# Patient Record
Sex: Female | Born: 1940 | Race: White | Hispanic: No | Smoking: Former smoker
Health system: Southern US, Community
[De-identification: ages and names within clinical notes are randomized; demographics above are authoritative.]

## PROBLEM LIST (undated history)

## (undated) DIAGNOSIS — E079 Disorder of thyroid, unspecified: Secondary | ICD-10-CM

## (undated) DIAGNOSIS — K7460 Unspecified cirrhosis of liver: Secondary | ICD-10-CM

## (undated) DIAGNOSIS — E039 Hypothyroidism, unspecified: Secondary | ICD-10-CM

## (undated) DIAGNOSIS — I10 Essential (primary) hypertension: Secondary | ICD-10-CM

---

## 2012-01-01 ENCOUNTER — Ambulatory Visit (INDEPENDENT_AMBULATORY_CARE_PROVIDER_SITE_OTHER): Payer: Medicare Other | Admitting: Family Medicine

## 2012-01-01 VITALS — BP 133/83 | HR 86 | Temp 97.8°F | Resp 16 | Ht 64.0 in | Wt 204.0 lb

## 2012-01-01 DIAGNOSIS — I10 Essential (primary) hypertension: Secondary | ICD-10-CM | POA: Insufficient documentation

## 2012-01-01 DIAGNOSIS — E039 Hypothyroidism, unspecified: Secondary | ICD-10-CM | POA: Insufficient documentation

## 2012-01-01 MED ORDER — CHLORTHALIDONE 25 MG PO TABS: 25.0000 mg | ORAL_TABLET | Freq: Every day | ORAL | Status: DC

## 2012-01-01 NOTE — Progress Notes (Signed)
  Subjective:    Patient ID: Kaylee King, female    DOB: 07/14/1941, 71 y.o.   MRN: [id number]  HPI  Seen earlier today at Dr. Moshe Cipro for dental consultation/routine care and BP noted to be elevated.  No history of HTN. Denies FH of HTN  Occ headache  Denies CP/SOB/or focal deficits   PMH/ Hypothyroid  Prior care [facility] Review of Systems     Objective:   Physical Exam  Constitutional: She appears well-developed and well-nourished.  Eyes: Pupils are equal, round, and reactive to light.  Neck: Normal range of motion. No JVD present. Carotid bruit is not present. No edema present. No thyromegaly present.  Cardiovascular: Normal rate, regular rhythm and normal heart sounds.   Pulmonary/Chest: Effort normal and breath sounds normal.  Abdominal: Soft. Bowel sounds are normal. There is no hepatosplenomegaly.  Neurological: She is alert.  Skin: Skin is warm.  Psychiatric:       Abrupt in demeanor           Assessment & Plan:   1. HTN (hypertension)  chlorthalidone (HYGROTON) 25 MG tablet, Comprehensive metabolic panel  2. Hypothyroid  TSH   Patient to follow up with her primary MD in 1 month to recheck BP

## 2012-01-02 LAB — COMPREHENSIVE METABOLIC PANEL
ALT: 11 U/L (ref 0–35)
AST: 20 U/L (ref 0–37)
Albumin: 4.9 g/dL (ref 3.5–5.2)
Alkaline Phosphatase: 67 U/L (ref 39–117)
BUN: 13 mg/dL (ref 6–23)
CO2: 26 mEq/L (ref 19–32)
Calcium: 9.8 mg/dL (ref 8.4–10.5)
Chloride: 101 mEq/L (ref 96–112)
Creat: 0.98 mg/dL (ref 0.50–1.10)
Glucose, Bld: 109 mg/dL — ABNORMAL HIGH (ref 70–99)
Potassium: 4.4 mEq/L (ref 3.5–5.3)
Sodium: 141 mEq/L (ref 135–145)
Total Bilirubin: 0.8 mg/dL (ref 0.3–1.2)
Total Protein: 8.3 g/dL (ref 6.0–8.3)

## 2012-01-02 LAB — TSH: TSH: 3.665 u[IU]/mL (ref 0.350–4.500)

## 2012-05-21 ENCOUNTER — Telehealth: Payer: Self-pay

## 2012-05-21 NOTE — Telephone Encounter (Signed)
Patient saw Dr. Hal Hope earlier in the year and had a "workup". Now wants prescribed a thyroid medication that was not previously prescribed. Advised her she might need to be evaluated by new provider before we could do that. Please call patient at [phone] and advise if we can order new RX or if she needs to be seen first.

## 2020-11-23 ENCOUNTER — Encounter (HOSPITAL_COMMUNITY)
Admission: EM | Disposition: A | Discharge status: Skilled Nursing Facility | Payer: Self-pay | Source: Home / Self Care | Attending: Student

## 2020-11-23 ENCOUNTER — Emergency Department (HOSPITAL_COMMUNITY): Payer: Medicare Other

## 2020-11-23 ENCOUNTER — Inpatient Hospital Stay (HOSPITAL_COMMUNITY): Payer: Medicare Other

## 2020-11-23 ENCOUNTER — Inpatient Hospital Stay (HOSPITAL_COMMUNITY)
Admission: EM | Admit: 2020-11-23 | Discharge: 2020-12-19 | DRG: 432 | Disposition: A | Discharge status: Skilled Nursing Facility | Payer: Medicare Other | Attending: Family Medicine | Admitting: Family Medicine

## 2020-11-23 ENCOUNTER — Encounter (HOSPITAL_COMMUNITY): Payer: Self-pay

## 2020-11-23 ENCOUNTER — Other Ambulatory Visit: Payer: Self-pay

## 2020-11-23 DIAGNOSIS — N179 Acute kidney failure, unspecified: Secondary | ICD-10-CM | POA: Diagnosis present

## 2020-11-23 DIAGNOSIS — K259 Gastric ulcer, unspecified as acute or chronic, without hemorrhage or perforation: Secondary | ICD-10-CM | POA: Diagnosis not present

## 2020-11-23 DIAGNOSIS — F05 Delirium due to known physiological condition: Secondary | ICD-10-CM | POA: Diagnosis present

## 2020-11-23 DIAGNOSIS — J440 Chronic obstructive pulmonary disease with acute lower respiratory infection: Secondary | ICD-10-CM | POA: Diagnosis present

## 2020-11-23 DIAGNOSIS — K922 Gastrointestinal hemorrhage, unspecified: Secondary | ICD-10-CM | POA: Diagnosis not present

## 2020-11-23 DIAGNOSIS — I8510 Secondary esophageal varices without bleeding: Secondary | ICD-10-CM | POA: Diagnosis not present

## 2020-11-23 DIAGNOSIS — K7200 Acute and subacute hepatic failure without coma: Secondary | ICD-10-CM | POA: Diagnosis present

## 2020-11-23 DIAGNOSIS — R0902 Hypoxemia: Secondary | ICD-10-CM

## 2020-11-23 DIAGNOSIS — K709 Alcoholic liver disease, unspecified: Secondary | ICD-10-CM | POA: Diagnosis present

## 2020-11-23 DIAGNOSIS — K766 Portal hypertension: Secondary | ICD-10-CM | POA: Diagnosis present

## 2020-11-23 DIAGNOSIS — Z79890 Hormone replacement therapy (postmenopausal): Secondary | ICD-10-CM

## 2020-11-23 DIAGNOSIS — J9622 Acute and chronic respiratory failure with hypercapnia: Secondary | ICD-10-CM | POA: Diagnosis present

## 2020-11-23 DIAGNOSIS — E876 Hypokalemia: Secondary | ICD-10-CM | POA: Diagnosis not present

## 2020-11-23 DIAGNOSIS — R1310 Dysphagia, unspecified: Secondary | ICD-10-CM

## 2020-11-23 DIAGNOSIS — D689 Coagulation defect, unspecified: Secondary | ICD-10-CM | POA: Diagnosis present

## 2020-11-23 DIAGNOSIS — Z931 Gastrostomy status: Secondary | ICD-10-CM

## 2020-11-23 DIAGNOSIS — I864 Gastric varices: Secondary | ICD-10-CM | POA: Diagnosis present

## 2020-11-23 DIAGNOSIS — J189 Pneumonia, unspecified organism: Secondary | ICD-10-CM | POA: Diagnosis present

## 2020-11-23 DIAGNOSIS — J9690 Respiratory failure, unspecified, unspecified whether with hypoxia or hypercapnia: Secondary | ICD-10-CM

## 2020-11-23 DIAGNOSIS — N390 Urinary tract infection, site not specified: Secondary | ICD-10-CM | POA: Diagnosis present

## 2020-11-23 DIAGNOSIS — L89152 Pressure ulcer of sacral region, stage 2: Secondary | ICD-10-CM | POA: Diagnosis not present

## 2020-11-23 DIAGNOSIS — Z20822 Contact with and (suspected) exposure to covid-19: Secondary | ICD-10-CM | POA: Diagnosis present

## 2020-11-23 DIAGNOSIS — E669 Obesity, unspecified: Secondary | ICD-10-CM | POA: Diagnosis present

## 2020-11-23 DIAGNOSIS — Z87891 Personal history of nicotine dependence: Secondary | ICD-10-CM

## 2020-11-23 DIAGNOSIS — K7581 Nonalcoholic steatohepatitis (NASH): Secondary | ICD-10-CM | POA: Diagnosis present

## 2020-11-23 DIAGNOSIS — K7460 Unspecified cirrhosis of liver: Secondary | ICD-10-CM | POA: Diagnosis not present

## 2020-11-23 DIAGNOSIS — D62 Acute posthemorrhagic anemia: Secondary | ICD-10-CM | POA: Diagnosis present

## 2020-11-23 DIAGNOSIS — I8511 Secondary esophageal varices with bleeding: Secondary | ICD-10-CM | POA: Diagnosis present

## 2020-11-23 DIAGNOSIS — G9340 Encephalopathy, unspecified: Secondary | ICD-10-CM

## 2020-11-23 DIAGNOSIS — E870 Hyperosmolality and hypernatremia: Secondary | ICD-10-CM | POA: Diagnosis not present

## 2020-11-23 DIAGNOSIS — E874 Mixed disorder of acid-base balance: Secondary | ICD-10-CM | POA: Diagnosis not present

## 2020-11-23 DIAGNOSIS — Z9289 Personal history of other medical treatment: Secondary | ICD-10-CM

## 2020-11-23 DIAGNOSIS — D649 Anemia, unspecified: Secondary | ICD-10-CM

## 2020-11-23 DIAGNOSIS — J9601 Acute respiratory failure with hypoxia: Secondary | ICD-10-CM

## 2020-11-23 DIAGNOSIS — J690 Pneumonitis due to inhalation of food and vomit: Secondary | ICD-10-CM | POA: Diagnosis not present

## 2020-11-23 DIAGNOSIS — K7469 Other cirrhosis of liver: Secondary | ICD-10-CM | POA: Diagnosis present

## 2020-11-23 DIAGNOSIS — L89022 Pressure ulcer of left elbow, stage 2: Secondary | ICD-10-CM | POA: Diagnosis not present

## 2020-11-23 DIAGNOSIS — Z86718 Personal history of other venous thrombosis and embolism: Secondary | ICD-10-CM

## 2020-11-23 DIAGNOSIS — E035 Myxedema coma: Secondary | ICD-10-CM | POA: Diagnosis present

## 2020-11-23 DIAGNOSIS — I472 Ventricular tachycardia: Secondary | ICD-10-CM | POA: Diagnosis not present

## 2020-11-23 DIAGNOSIS — R188 Other ascites: Secondary | ICD-10-CM | POA: Diagnosis present

## 2020-11-23 DIAGNOSIS — L8990 Pressure ulcer of unspecified site, unspecified stage: Secondary | ICD-10-CM | POA: Diagnosis present

## 2020-11-23 DIAGNOSIS — G9341 Metabolic encephalopathy: Secondary | ICD-10-CM

## 2020-11-23 DIAGNOSIS — J9602 Acute respiratory failure with hypercapnia: Secondary | ICD-10-CM | POA: Diagnosis not present

## 2020-11-23 DIAGNOSIS — Z7189 Other specified counseling: Secondary | ICD-10-CM | POA: Diagnosis not present

## 2020-11-23 DIAGNOSIS — K257 Chronic gastric ulcer without hemorrhage or perforation: Secondary | ICD-10-CM | POA: Diagnosis not present

## 2020-11-23 DIAGNOSIS — Z9114 Patient's other noncompliance with medication regimen: Secondary | ICD-10-CM

## 2020-11-23 DIAGNOSIS — Z8719 Personal history of other diseases of the digestive system: Secondary | ICD-10-CM | POA: Diagnosis not present

## 2020-11-23 DIAGNOSIS — K7210 Chronic hepatic failure without coma: Secondary | ICD-10-CM | POA: Diagnosis present

## 2020-11-23 DIAGNOSIS — K920 Hematemesis: Secondary | ICD-10-CM | POA: Diagnosis not present

## 2020-11-23 DIAGNOSIS — R7989 Other specified abnormal findings of blood chemistry: Secondary | ICD-10-CM | POA: Diagnosis not present

## 2020-11-23 DIAGNOSIS — R627 Adult failure to thrive: Secondary | ICD-10-CM | POA: Diagnosis present

## 2020-11-23 DIAGNOSIS — R578 Other shock: Secondary | ICD-10-CM | POA: Diagnosis present

## 2020-11-23 DIAGNOSIS — I8501 Esophageal varices with bleeding: Secondary | ICD-10-CM | POA: Diagnosis not present

## 2020-11-23 DIAGNOSIS — Z515 Encounter for palliative care: Secondary | ICD-10-CM | POA: Diagnosis not present

## 2020-11-23 DIAGNOSIS — R001 Bradycardia, unspecified: Secondary | ICD-10-CM | POA: Diagnosis not present

## 2020-11-23 DIAGNOSIS — E875 Hyperkalemia: Secondary | ICD-10-CM | POA: Diagnosis not present

## 2020-11-23 DIAGNOSIS — K253 Acute gastric ulcer without hemorrhage or perforation: Secondary | ICD-10-CM | POA: Insufficient documentation

## 2020-11-23 DIAGNOSIS — E039 Hypothyroidism, unspecified: Secondary | ICD-10-CM

## 2020-11-23 DIAGNOSIS — Z79899 Other long term (current) drug therapy: Secondary | ICD-10-CM

## 2020-11-23 DIAGNOSIS — I8500 Esophageal varices without bleeding: Secondary | ICD-10-CM | POA: Insufficient documentation

## 2020-11-23 DIAGNOSIS — R4182 Altered mental status, unspecified: Secondary | ICD-10-CM | POA: Diagnosis present

## 2020-11-23 DIAGNOSIS — B9620 Unspecified Escherichia coli [E. coli] as the cause of diseases classified elsewhere: Secondary | ICD-10-CM | POA: Diagnosis present

## 2020-11-23 DIAGNOSIS — R069 Unspecified abnormalities of breathing: Secondary | ICD-10-CM

## 2020-11-23 DIAGNOSIS — I10 Essential (primary) hypertension: Secondary | ICD-10-CM | POA: Diagnosis present

## 2020-11-23 DIAGNOSIS — D696 Thrombocytopenia, unspecified: Secondary | ICD-10-CM | POA: Diagnosis present

## 2020-11-23 DIAGNOSIS — J9621 Acute and chronic respiratory failure with hypoxia: Secondary | ICD-10-CM | POA: Diagnosis present

## 2020-11-23 DIAGNOSIS — B9681 Helicobacter pylori [H. pylori] as the cause of diseases classified elsewhere: Secondary | ICD-10-CM | POA: Diagnosis present

## 2020-11-23 DIAGNOSIS — Z91148 Patient's other noncompliance with medication regimen for other reason: Secondary | ICD-10-CM

## 2020-11-23 DIAGNOSIS — Z6831 Body mass index (BMI) 31.0-31.9, adult: Secondary | ICD-10-CM

## 2020-11-23 HISTORY — PX: ESOPHAGOGASTRODUODENOSCOPY: SHX5428

## 2020-11-23 HISTORY — DX: Disorder of thyroid, unspecified: E07.9

## 2020-11-23 HISTORY — DX: Unspecified cirrhosis of liver: K74.60

## 2020-11-23 HISTORY — PX: ESOPHAGEAL BANDING: SHX5518

## 2020-11-23 LAB — PHOSPHORUS: Phosphorus: 3.3 mg/dL (ref 2.5–4.6)

## 2020-11-23 LAB — COMPREHENSIVE METABOLIC PANEL
ALT: 13 U/L (ref 0–44)
AST: 29 U/L (ref 15–41)
Albumin: 2.3 g/dL — ABNORMAL LOW (ref 3.5–5.0)
Alkaline Phosphatase: 93 U/L (ref 38–126)
Anion gap: 13 (ref 5–15)
BUN: 45 mg/dL — ABNORMAL HIGH (ref 8–23)
CO2: 18 mmol/L — ABNORMAL LOW (ref 22–32)
Calcium: 8.1 mg/dL — ABNORMAL LOW (ref 8.9–10.3)
Chloride: 112 mmol/L — ABNORMAL HIGH (ref 98–111)
Creatinine, Ser: 1.79 mg/dL — ABNORMAL HIGH (ref 0.44–1.00)
GFR, Estimated: 28 mL/min — ABNORMAL LOW (ref >60)
Glucose, Bld: 136 mg/dL — ABNORMAL HIGH (ref 70–99)
Potassium: 4.2 mmol/L (ref 3.5–5.1)
Sodium: 143 mmol/L (ref 135–145)
Total Bilirubin: 0.9 mg/dL (ref 0.3–1.2)
Total Protein: 5.6 g/dL — ABNORMAL LOW (ref 6.5–8.1)

## 2020-11-23 LAB — URINALYSIS, ROUTINE W REFLEX MICROSCOPIC
Bilirubin Urine: NEGATIVE
Bilirubin Urine: NEGATIVE
Glucose, UA: NEGATIVE mg/dL
Glucose, UA: NEGATIVE mg/dL
Hgb urine dipstick: NEGATIVE
Hgb urine dipstick: NEGATIVE
Ketones, ur: NEGATIVE mg/dL
Ketones, ur: NEGATIVE mg/dL
Nitrite: NEGATIVE
Nitrite: NEGATIVE
Protein, ur: 30 mg/dL — AB
Protein, ur: 30 mg/dL — AB
Specific Gravity, Urine: 1.015 (ref 1.005–1.030)
Specific Gravity, Urine: 1.015 (ref 1.005–1.030)
WBC, UA: >50 WBC/hpf — ABNORMAL HIGH (ref 0–5)
WBC, UA: >50 WBC/hpf — ABNORMAL HIGH (ref 0–5)
pH: 5 (ref 5.0–8.0)
pH: 5 (ref 5.0–8.0)

## 2020-11-23 LAB — POC OCCULT BLOOD, ED: Fecal Occult Bld: NEGATIVE

## 2020-11-23 LAB — TROPONIN I (HIGH SENSITIVITY): Troponin I (High Sensitivity): 24 ng/L — ABNORMAL HIGH (ref <18)

## 2020-11-23 LAB — RAPID URINE DRUG SCREEN, HOSP PERFORMED
Amphetamines: NOT DETECTED
Barbiturates: NOT DETECTED
Benzodiazepines: NOT DETECTED
Cocaine: NOT DETECTED
Opiates: POSITIVE — AB
Tetrahydrocannabinol: NOT DETECTED

## 2020-11-23 LAB — CK: Total CK: 74 U/L (ref 38–234)

## 2020-11-23 LAB — SARS CORONAVIRUS 2 BY RT PCR (HOSPITAL ORDER, PERFORMED IN ~~LOC~~ HOSPITAL LAB): SARS Coronavirus 2: NEGATIVE

## 2020-11-23 LAB — ABO/RH: ABO/RH(D): O POS

## 2020-11-23 LAB — LACTIC ACID, PLASMA: Lactic Acid, Venous: 5.5 mmol/L (ref 0.5–1.9)

## 2020-11-23 LAB — LIPASE, BLOOD: Lipase: 50 U/L (ref 11–51)

## 2020-11-23 LAB — GLUCOSE, CAPILLARY: Glucose-Capillary: 175 mg/dL — ABNORMAL HIGH (ref 70–99)

## 2020-11-23 LAB — CBG MONITORING, ED: Glucose-Capillary: 158 mg/dL — ABNORMAL HIGH (ref 70–99)

## 2020-11-23 LAB — PREPARE RBC (CROSSMATCH)

## 2020-11-23 LAB — TSH: TSH: 51.314 u[IU]/mL — ABNORMAL HIGH (ref 0.350–4.500)

## 2020-11-23 LAB — MAGNESIUM: Magnesium: 2.2 mg/dL (ref 1.7–2.4)

## 2020-11-23 SURGERY — EGD (ESOPHAGOGASTRODUODENOSCOPY)
Anesthesia: Moderate Sedation

## 2020-11-23 MED ORDER — LACTATED RINGERS IV BOLUS
500.0000 mL | Freq: Once | INTRAVENOUS | Status: AC
  Administered 2020-11-23: 500 mL via INTRAVENOUS

## 2020-11-23 MED ORDER — PANTOPRAZOLE SODIUM 40 MG IV SOLR
40.0000 mg | Freq: Two times a day (BID) | INTRAVENOUS | Status: DC
  Administered 2020-11-23: 40 mg via INTRAVENOUS

## 2020-11-23 MED ORDER — ROCURONIUM BROMIDE 50 MG/5ML IV SOLN
50.0000 mg | Freq: Once | INTRAVENOUS | Status: DC
  Filled 2020-11-23: qty 5

## 2020-11-23 MED ORDER — SODIUM CHLORIDE 0.9% IV SOLUTION: Freq: Once | INTRAVENOUS | Status: DC

## 2020-11-23 MED ORDER — PANTOPRAZOLE SODIUM 40 MG IV SOLR
40.0000 mg | Freq: Two times a day (BID) | INTRAVENOUS | Status: DC
  Filled 2020-11-23: qty 40

## 2020-11-23 MED ORDER — ETOMIDATE 2 MG/ML IV SOLN
10.0000 mg | Freq: Once | INTRAVENOUS | Status: AC
  Administered 2020-11-23: 10 mg via INTRAVENOUS

## 2020-11-23 MED ORDER — ONDANSETRON HCL 4 MG/2ML IJ SOLN
INTRAMUSCULAR | Status: AC
  Administered 2020-11-23: 4 mg via INTRAVENOUS
  Filled 2020-11-23: qty 2

## 2020-11-23 MED ORDER — CHLORHEXIDINE GLUCONATE CLOTH 2 % EX PADS
6.0000 | MEDICATED_PAD | Freq: Every day | CUTANEOUS | Status: DC
  Administered 2020-11-24 – 2020-12-19 (×24): 6 via TOPICAL

## 2020-11-23 MED ORDER — SUCCINYLCHOLINE CHLORIDE 20 MG/ML IJ SOLN
100.0000 mg | Freq: Once | INTRAMUSCULAR | Status: AC
  Administered 2020-11-23: 100 mg via INTRAVENOUS
  Filled 2020-11-23: qty 5

## 2020-11-23 MED ORDER — ETOMIDATE 2 MG/ML IV SOLN
20.0000 mg | Freq: Once | INTRAVENOUS | Status: AC
  Administered 2020-11-23: 20 mg via INTRAVENOUS

## 2020-11-23 MED ORDER — STERILE WATER FOR INJECTION IV SOLN
INTRAVENOUS | Status: DC
  Filled 2020-11-23 (×4): qty 850

## 2020-11-23 MED ORDER — DEXMEDETOMIDINE HCL IN NACL 400 MCG/100ML IV SOLN
0.0000 ug/kg/h | INTRAVENOUS | Status: AC
  Administered 2020-11-23 – 2020-11-24 (×3): 0.4 ug/kg/h via INTRAVENOUS
  Filled 2020-11-23 (×4): qty 100

## 2020-11-23 MED ORDER — DOCUSATE SODIUM 50 MG/5ML PO LIQD
100.0000 mg | Freq: Two times a day (BID) | ORAL | Status: DC
  Administered 2020-11-25 – 2020-11-28 (×4): 100 mg
  Filled 2020-11-23 (×6): qty 10

## 2020-11-23 MED ORDER — CHLORHEXIDINE GLUCONATE 0.12% ORAL RINSE (MEDLINE KIT)
15.0000 mL | Freq: Two times a day (BID) | OROMUCOSAL | Status: DC
  Administered 2020-11-23 – 2020-11-30 (×14): 15 mL via OROMUCOSAL

## 2020-11-23 MED ORDER — ONDANSETRON HCL 4 MG/2ML IJ SOLN: 4.0000 mg | Freq: Once | INTRAMUSCULAR | Status: AC

## 2020-11-23 MED ORDER — SODIUM CHLORIDE 0.9 % IV SOLN
10.0000 mL/h | Freq: Once | INTRAVENOUS | Status: AC
  Administered 2020-11-24: 10 mL/h via INTRAVENOUS

## 2020-11-23 MED ORDER — SODIUM BICARBONATE 8.4 % IV SOLN: 100.0000 meq | Freq: Once | INTRAVENOUS | Status: AC

## 2020-11-23 MED ORDER — SODIUM CHLORIDE 0.9 % IV SOLN
2.0000 g | INTRAVENOUS | Status: DC
  Filled 2020-11-23: qty 20

## 2020-11-23 MED ORDER — SODIUM CHLORIDE 0.9 % IV SOLN: INTRAVENOUS | Status: DC

## 2020-11-23 MED ORDER — INSULIN ASPART 100 UNIT/ML ~~LOC~~ SOLN
0.0000 [IU] | SUBCUTANEOUS | Status: DC
  Administered 2020-11-28 – 2020-12-01 (×10): 2 [IU] via SUBCUTANEOUS
  Administered 2020-12-01 – 2020-12-02 (×2): 4 [IU] via SUBCUTANEOUS
  Administered 2020-12-02 – 2020-12-05 (×17): 2 [IU] via SUBCUTANEOUS
  Administered 2020-12-05: 4 [IU] via SUBCUTANEOUS
  Administered 2020-12-05 – 2020-12-06 (×3): 2 [IU] via SUBCUTANEOUS
  Administered 2020-12-06: 4 [IU] via SUBCUTANEOUS
  Administered 2020-12-08 – 2020-12-13 (×8): 2 [IU] via SUBCUTANEOUS

## 2020-11-23 MED ORDER — ORAL CARE MOUTH RINSE
15.0000 mL | OROMUCOSAL | Status: DC
  Administered 2020-11-24 – 2020-11-30 (×63): 15 mL via OROMUCOSAL

## 2020-11-23 MED ORDER — EPINEPHRINE 1 MG/10ML IJ SOSY
PREFILLED_SYRINGE | INTRAMUSCULAR | Status: AC
  Filled 2020-11-23: qty 10

## 2020-11-23 MED ORDER — SODIUM CHLORIDE 0.9 % IV SOLN
8.0000 mg/h | INTRAVENOUS | Status: AC
  Administered 2020-11-23 – 2020-11-26 (×7): 8 mg/h via INTRAVENOUS
  Filled 2020-11-23 (×12): qty 80

## 2020-11-23 MED ORDER — LEVOTHYROXINE SODIUM 100 MCG/5ML IV SOLN
50.0000 ug | Freq: Every day | INTRAVENOUS | Status: DC
  Administered 2020-11-24 – 2020-11-27 (×4): 50 ug via INTRAVENOUS
  Filled 2020-11-23 (×6): qty 5

## 2020-11-23 MED ORDER — FENTANYL CITRATE (PF) 100 MCG/2ML IJ SOLN
INTRAMUSCULAR | Status: AC
  Administered 2020-11-23: 100 ug via INTRAVENOUS
  Filled 2020-11-23: qty 2

## 2020-11-23 MED ORDER — POLYETHYLENE GLYCOL 3350 17 G PO PACK: 17.0000 g | PACK | Freq: Every day | ORAL | Status: DC | PRN

## 2020-11-23 MED ORDER — SODIUM BICARBONATE 8.4 % IV SOLN
INTRAVENOUS | Status: AC
  Administered 2020-11-23: 100 meq via INTRAVENOUS
  Filled 2020-11-23: qty 150

## 2020-11-23 MED ORDER — SODIUM CHLORIDE 0.9 % IV SOLN
80.0000 mg | Freq: Once | INTRAVENOUS | Status: AC
  Administered 2020-11-23: 80 mg via INTRAVENOUS
  Filled 2020-11-23: qty 80

## 2020-11-23 MED ORDER — FENTANYL 2500MCG IN NS 250ML (10MCG/ML) PREMIX INFUSION
0.0000 ug/h | INTRAVENOUS | Status: DC
  Administered 2020-11-23: 25 ug/h via INTRAVENOUS
  Administered 2020-11-24: 50 ug/h via INTRAVENOUS
  Administered 2020-11-25: 250 ug/h via INTRAVENOUS
  Administered 2020-11-25: 225 ug/h via INTRAVENOUS
  Administered 2020-11-26: 125 ug/h via INTRAVENOUS
  Administered 2020-11-26: 250 ug/h via INTRAVENOUS
  Administered 2020-11-27: 50 ug/h via INTRAVENOUS
  Filled 2020-11-23 (×7): qty 250

## 2020-11-23 MED ORDER — SODIUM CHLORIDE 0.9 % IV SOLN
50.0000 ug/h | INTRAVENOUS | Status: AC
  Administered 2020-11-23 – 2020-11-26 (×6): 50 ug/h via INTRAVENOUS
  Filled 2020-11-23 (×8): qty 1

## 2020-11-23 MED ORDER — MORPHINE SULFATE (PF) 2 MG/ML IV SOLN
2.0000 mg | Freq: Once | INTRAVENOUS | Status: AC
  Administered 2020-11-23: 2 mg via INTRAVENOUS
  Filled 2020-11-23: qty 1

## 2020-11-23 MED ORDER — FENTANYL CITRATE (PF) 100 MCG/2ML IJ SOLN: 25.0000 ug | INTRAMUSCULAR | Status: DC | PRN

## 2020-11-23 MED ORDER — LACTATED RINGERS IV BOLUS
1000.0000 mL | Freq: Once | INTRAVENOUS | Status: AC
  Administered 2020-11-23: 1000 mL via INTRAVENOUS

## 2020-11-23 MED ORDER — DOCUSATE SODIUM 100 MG PO CAPS: 100.0000 mg | ORAL_CAPSULE | Freq: Two times a day (BID) | ORAL | Status: DC | PRN

## 2020-11-23 MED ORDER — MIDAZOLAM HCL (PF) 5 MG/ML IJ SOLN
INTRAMUSCULAR | Status: AC
  Filled 2020-11-23: qty 3

## 2020-11-23 MED ORDER — POLYETHYLENE GLYCOL 3350 17 G PO PACK
17.0000 g | PACK | Freq: Every day | ORAL | Status: DC
  Administered 2020-11-28: 17 g
  Filled 2020-11-23 (×3): qty 1

## 2020-11-23 MED ORDER — FENTANYL CITRATE (PF) 100 MCG/2ML IJ SOLN
INTRAMUSCULAR | Status: AC
  Filled 2020-11-23: qty 4

## 2020-11-23 MED ORDER — NOREPINEPHRINE 4 MG/250ML-% IV SOLN
0.0000 ug/min | INTRAVENOUS | Status: DC
  Administered 2020-11-23: 30 ug/min via INTRAVENOUS
  Administered 2020-11-24 – 2020-11-25 (×2): 2 ug/min via INTRAVENOUS
  Filled 2020-11-23 (×2): qty 250

## 2020-11-23 NOTE — ED Provider Notes (Addendum)
[facility] EMERGENCY DEPARTMENT Provider Note   CSN: [id number] Arrival date & time: 11/23/20  1050     History Chief Complaint  Patient presents with  . Altered Mental Status    Kaylee King is a 80 y.o. female.  HPI Patient has refused all medical care for several years.  Patient's daughter reports that she knows that she is hypothyroid but she has stopped taking any thyroid medications for couple of years.  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  She is not getting any kind of treatment or taking any medications.  Patient has had 2 COVID vaccines but not the booster.  Patient daughter reports that since Christmas, patient has been complaining of a headache.  She has not had any fever that she is aware of.  She reports just now over the past couple of days the headache is gotten severe and she is also noted confusion.  She reports she has vomited a couple of times.  Her appetite has decreased significantly.  Patient's daughter reports that now that her mother finally could not refuse, she is called EMS to transport her to the hospital.  EMS arrival, patient had about a 2-minute unresponsive episode when transferring to the stretcher.  No apparent loss of pulses.  CBG in the field 198.  Room air saturation 90%  Patient appears uncomfortable but she is denying any specific symptoms.  She says she does have a headache sometime but is not endorsing it right now.  She reports sometimes her abdomen is uncomfortable but denying it currently.  She seems confused and ill.    Past Medical History:  Diagnosis Date  . Cirrhosis (HCC)   . Thyroid disease    hypothyroid     Patient Active Problem List   Diagnosis Date Noted  . Hypothyroid 01/01/2012  . HTN (hypertension) 01/01/2012    History reviewed. No pertinent surgical history.   OB History   No obstetric history on file.     History reviewed. No pertinent family history.  Social  History   Tobacco Use  . Smoking status: Former Smoker    Quit date: 12/31/1981    Years since quitting: 38.9  . Smokeless tobacco: Never Used  Substance Use Topics  . Alcohol use: Never  . Drug use: Never    Home Medications Prior to Admission medications   Medication Sig Start Date End Date Taking? Authorizing Provider  chlorthalidone (HYGROTON) 25 MG tablet Take 1 tablet (25 mg total) by mouth daily. 01/01/12 12/31/12  Dois Davenport, MD  levothyroxine (SYNTHROID, LEVOTHROID) 100 MCG tablet Take 100 mcg by mouth daily.    [provider]    Allergies    Patient has no known allergies.  Review of Systems   Review of Systems Level 5 caveat cannot obtain review of systems due to acute illness. Physical Exam Updated Vital Signs BP (!) 88/47   Pulse 83   Temp (!) 97.3 F (36.3 C) (Rectal)   Resp 12   Ht 5\' 4"  (1.626 m)   Wt 92.5 kg   SpO2 100%   BMI 35.00 kg/m   Physical Exam Constitutional:      Comments: Patient is extremely pale, ill and deconditioned in appearance.  HENT:     Head: Normocephalic and atraumatic.     Mouth/Throat:     Comments: Mucous membranes very dry Eyes:     Extraocular Movements: Extraocular movements intact.  Cardiovascular:     Comments: Heart  sounds soft and distant, regular. Pulmonary:     Comments: No respiratory distress.  Lungs are grossly clear. Abdominal:     Comments: Abdomen is soft.  Patient is not guarding.  Genitourinary:    Comments: Stool is brown in appearance. Musculoskeletal:     Comments: No contusions or abrasions to the back buttocks.  No significant peripheral edema.  Patient does seem to have muscular atrophy.  No cellulitic changes or active wounds.  Skin:    Comments: Skin is warm.  Patient is extremely pale.  Neurological:     Comments: Patient is awake but seems confused.  He however is not exhibiting agitated delirium.  She is generally weak but does not have a focal motor deficit     ED  Results / Procedures / Treatments   Labs (all labs ordered are listed, but only abnormal results are displayed) Labs Reviewed  COMPREHENSIVE METABOLIC PANEL - Abnormal; Notable for the following components:      Result Value   Chloride 112 (*)    CO2 18 (*)    Glucose, Bld 136 (*)    BUN 45 (*)    Creatinine, Ser 1.79 (*)    Calcium 8.1 (*)    Total Protein 5.6 (*)    Albumin 2.3 (*)    GFR, Estimated 28 (*)    All other components within normal limits  TSH - Abnormal; Notable for the following components:   TSH 51.314 (*)    All other components within normal limits  SARS CORONAVIRUS 2 BY RT PCR (HOSPITAL ORDER, PERFORMED IN [facility])  URINE CULTURE  AMMONIA  MAGNESIUM  PHOSPHORUS  CK  PROTIME-INR  LACTIC ACID, PLASMA  LACTIC ACID, PLASMA  LIPASE, BLOOD  DIFFERENTIAL  URINALYSIS, ROUTINE W REFLEX MICROSCOPIC  POC OCCULT BLOOD, ED  I-STAT CHEM 8, ED  TYPE AND SCREEN  ABO/RH  PREPARE RBC (CROSSMATCH)  TROPONIN I (HIGH SENSITIVITY)  TROPONIN I (HIGH SENSITIVITY)    EKG EKG Interpretation  Date/Time:  Thursday November 23 2020 11:23:22 EST Ventricular Rate:  82 PR Interval:    QRS Duration: 86 QT Interval:  352 QTC Calculation: 412 R Axis:   8 Text Interpretation: Sinus rhythm Low voltage, precordial leads Nonspecific T abnrm, anterolateral leads AGREE, NO stemi, NO OLD COMPARISON Confirmed by Arby Barrette [phone]) on 11/23/2020 1:28:33 PM   Radiology CT Head Wo Contrast  Result Date: 11/23/2020 CLINICAL DATA:  Headache, altered mental status. EXAM: CT HEAD WITHOUT CONTRAST TECHNIQUE: Contiguous axial images were obtained from the base of the skull through the vertex without intravenous contrast. COMPARISON:  None. FINDINGS: Brain: The brainstem, cerebellum, cerebral peduncles, thalami, basal ganglia, basilar cisterns, and ventricular system appear within normal limits. Periventricular white matter and corona radiata hypodensities favor chronic  ischemic microvascular white matter disease. No intracranial hemorrhage, mass lesion, or acute CVA. Vascular: There is atherosclerotic calcification of the cavernous carotid arteries bilaterally. Skull: Unremarkable Sinuses/Orbits: Unremarkable Other: No supplemental non-categorized findings. IMPRESSION: 1. No acute intracranial findings. 2. Periventricular white matter and corona radiata hypodensities favor chronic ischemic microvascular white matter disease. Electronically Signed   By: Gaylyn Rong M.D.   On: 11/23/2020 13:55   DG Chest Port 1 View  Result Date: 11/23/2020 CLINICAL DATA:  Weakness declined appetite increased confusion x2 days EXAM: PORTABLE CHEST 1 VIEW COMPARISON:  None. FINDINGS: The heart size and mediastinal contours are mildly enlarged, accentuated by technique. Aortic atherosclerosis. Streaky left lower lobe opacity. No pleural effusion or pneumothorax. Thoracic  spondylosis and bilateral AC joints DJD. IMPRESSION: Streaky left lower lobe opacity, likely atelectasis but pneumonia not excluded. Electronically Signed   By: Maudry Mayhew MD   On: 11/23/2020 13:07    Procedures Procedures (including critical care time) CRITICAL CARE Performed by: Arby Barrette   Total critical care time: 45 minutes  Critical care time was exclusive of separately billable procedures and treating other patients.  Critical care was necessary to treat or prevent imminent or life-threatening deterioration.  Critical care was time spent personally by me on the following activities: development of treatment plan with patient and/or surrogate as well as nursing, discussions with consultants, evaluation of patient's response to treatment, examination of patient, obtaining history from patient or surrogate, ordering and performing treatments and interventions, ordering and review of laboratory studies, ordering and review of radiographic studies, pulse oximetry and re-evaluation of patient's  condition. Medications Ordered in ED Medications  morphine 2 MG/ML injection 2 mg (has no administration in time range)  0.9 %  sodium chloride infusion (has no administration in time range)  lactated ringers bolus 1,000 mL (0 mLs Intravenous Stopped 11/23/20 1355)  lactated ringers bolus 500 mL (0 mLs Intravenous Stopped 11/23/20 1441)    ED Course  I have reviewed the triage vital signs and the nursing notes.  Pertinent labs & imaging results that were available during my care of the patient were reviewed by me and considered in my medical decision making (see chart for details).    MDM Rules/Calculators/A&P                         Patient presents very ill appearance.  She is extremely pale in appearance.  Hypotension.  Patient is not febrile.  Stool is not melanotic.  Will initiate fluid resuscitation with lactated Ringer's.  No obvious source of infection at this time.  Patient reportedly has some known cirrhosis, nonalcoholic that has not had any treatment.  By general appearance, suspect significant anemia.  However, no report of any active bleeding or coffee-ground emesis.  Multiple lab specimens have been submitted.  Lab has not been able to get a CBC read.  Blood is diluted in appearance.  At this time with patient appearing profoundly anemic clinically and inability to get a lab specimen, which I suspect is due to gross abnormality interfering with machine function, we will proceed with ordering blood for transfusion.  Patient continues to have a headache.  She does not have respiratory distress.  Pressures remain soft but have responded to some fluid resuscitation with lactated Ringer's.  At this time, due to concerns for extreme anemia, I will not continue with fluid resuscitation until patient has been transfused.  Patient has stopped taking all of her Synthroid for 2 years.  TSH severely elevated, I suspect patient patient to be profoundly hypothyroid.  Patient will be admitted to  ICU service.  Patient is severely ill.  I have reviewed risk of worsening condition with the patient's daughter.  At this time, patient's daughter wishes the patient to be full code.   Final Clinical Impression(s) / ED Diagnoses Final diagnoses:  Encephalopathy  Symptomatic anemia  Hypothyroidism, unspecified type  H/O medication noncompliance    Rx / DC Orders ED Discharge Orders    None       Arby Barrette, MD 11/23/20 1548    Arby Barrette, MD 11/23/20 1621

## 2020-11-23 NOTE — ED Provider Notes (Addendum)
Care of the patient assumed at the change of shift. Here for AMS, has not been taking meds for several years, including synthroid. Noted to be very pale here. Per lab CBC machine will not give any values due to concerns for contamination however this blood was drawn from a fresh IV. Suspect profound anemia, will give transfusion and recheck. Patient's daughter at bedside amenable to admission. Would like for her to be full code.   4:17 PM Spoke with Dr. Katrinka Blazing, ICU, who will evaluate for admission.    5:42 PM Patient noted to have large volume dark red emesis. 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 Will start Protonix, octreotide. Discussed with Dr. Katrinka Blazing at bedside, she will need intubation for airway protection. Daughter at bedside understands and gives verbal consent. GI paged.   6:35 PM Spoke with Dr. Rhea Belton, GI who will consult for possible endoscopy.   Procedure Name: Intubation Date/Time: 11/23/2020 6:36 PM Performed by: Pollyann Savoy, MD Pre-anesthesia Checklist: Patient identified, Patient being monitored, Emergency Drugs available, Timeout performed and Suction available Oxygen Delivery Method: Non-rebreather mask Preoxygenation: Pre-oxygenation with 100% oxygen Induction Type: Rapid sequence Ventilation: Mask ventilation without difficulty Laryngoscope Size: Glidescope and 3 Grade View: Grade I Tube size: 7.5 mm Number of attempts: 1 Placement Confirmation: ETT inserted through vocal cords under direct vision,  CO2 detector and Breath sounds checked- equal and bilateral Secured at: 21 cm Tube secured with: ETT holder    .Critical Care Performed by: Pollyann Savoy, MD Authorized by: Pollyann Savoy, MD   Critical care provider statement:    Critical care time (minutes):  45   Critical care time was exclusive of:  Separately billable procedures and treating other patients   Critical care  was necessary to treat or prevent imminent or life-threatening deterioration of the following conditions:  Circulatory failure and shock   Critical care was time spent personally by me on the following activities:  Discussions with consultants, evaluation of patient's response to treatment, examination of patient, ordering and performing treatments and interventions, ordering and review of laboratory studies, ordering and review of radiographic studies, pulse oximetry, re-evaluation of patient's condition, obtaining history from patient or surrogate and review of old charts   I assumed direction of critical care for this patient from another provider in my specialty: yes     Care discussed with: admitting provider         Pollyann Savoy, MD 11/23/20 1836

## 2020-11-23 NOTE — H&P (Signed)
NAME:  Kaylee King, MRN:  [id number], DOB:  [birth year], LOS: 0 ADMISSION DATE:  11/23/2020, CONSULTATION DATE:  11/23/20 REFERRING MD:  Bernette Mayers, CHIEF COMPLAINT:  AMS   Brief History   80 year old woman with history of hypothyroidism noncompliant presenting with progressive headaches, fatigue, confusion, and hypotension of unclear etiology.  History of present illness   80 year old woman with history of hypothyroidism noncompliant presenting with progressive headaches, fatigue, confusion, and hypotension of unclear etiology. - Over past 3 days, started with headache, which has been debilitating - Today, became associated with profound fatigue, confusion - Brought in by EMS, noted to have blood around nares - Patient unable to provide history so getting from daughter at bedside - No reports of GIB symptoms - Carries a diagnosis of NASH cirrhosis, portal vein thrombosis with cavernous transformation, iron deficiency anemia  Past Medical History  NASH cirrhosis Hypothyroidism noncompliant with meds Does not go to doctors Hx of ERCP in 2018, otherwise no surgical hx  Significant Hospital Events   1/20 admitted  Consults:  PCCM  Procedures:  n/a  Significant Diagnostic Tests:  CT Head 1. No acute intracranial findings. 2. Periventricular white matter and corona radiata hypodensities favor chronic ischemic microvascular white matter disease.  CXR neg  Micro Data:  COVID neg  Antimicrobials:  n/a   Interim history/subjective:  consulted  Objective   Blood pressure (!) 84/74, pulse 82, temperature (!) 97.2 F (36.2 C), temperature source Oral, resp. rate 20, height 5\' 4"  (1.626 m), weight 92.5 kg, SpO2 95 %.        Intake/Output Summary (Last 24 hours) at 11/23/2020 1642 Last data filed at 11/23/2020 1441 Gross per 24 hour  Intake 1500 ml  Output -  Net 1500 ml   Filed Weights   11/23/20 1108  Weight: 92.5 kg    Examination: Constitutional: pale ill appearing  woman in NAD  Eyes: pupils slightly dilated, equal, reactive Ears, nose, mouth, and throat: MM dry, blood around R nares Cardiovascular: RRR, +SEM, ext warm Respiratory: crackles bases Gastrointestinal: soft, diffuse TTP (tender to palpation everywhere) Skin: No rashes, normal turgor Neurologic: moves all 4 ext but requires repeated prompting Psychiatric: AO to self only  Cr 1.8 TSH 51 CBC unable to be run despite multiple tries with question of profound anemia.  Resolved Hospital Problem list   N/a  Assessment & Plan:  During course of my evaluation patient vomited blood.  This combined with hx of NASH cirrhosis, portal vein thrombosis, inability to run H/H, and hypotension make me concerned for hemorrhagic shock from variceal bleed.   Confused, high risk for aspiration and not protecting airway.  Question HE.  Uncontrolled hypothyroidism with clinical hx not c/w overt myxedema  Acute kidney injury, unclear baseline  - INR pending, transfuse PRN - 2 units pRBC ordered - Try to get a h/h after transfusion - PPI, octreotide gtt - Intubate, usual VAP prevention bundle - GI to be consulted by EDP - Ceftriaxone for SBP ppx - Start IV synthroid - Guarded prognosis   Best practice:  Diet: NPO Pain/Anxiety/Delirium protocol (if indicated): N/A VAP protocol (if indicated): in place DVT prophylaxis: SCD GI prophylaxis: PPI Glucose control: SSI Mobility: BR Code Status: full Family Communication: daughter at bedside Disposition: ICU   Medical Decision Making    Diagnoses that are immediately life threatening include UGIB, shock, encephalopathy Critical test findings: vomiting blood Interventions today to address these diagnoses are intubation, GI consult, PPI, octreotide Likelihood of life-threatening  deterioration without intervention is high.  Labs   CBC: No results for input(s): WBC, NEUTROABS, HGB, HCT, MCV, PLT in the last 168 hours.  Basic Metabolic  Panel: Recent Labs  Lab 11/23/20 1100  NA 143  K 4.2  CL 112*  CO2 18*  GLUCOSE 136*  BUN 45*  CREATININE 1.79*  CALCIUM 8.1*   GFR: Estimated Creatinine Clearance: 27.2 mL/min (A) (by C-G formula based on SCr of 1.79 mg/dL (H)). No results for input(s): PROCALCITON, WBC, LATICACIDVEN in the last 168 hours.  Liver Function Tests: Recent Labs  Lab 11/23/20 1100  AST 29  ALT 13  ALKPHOS 93  BILITOT 0.9  PROT 5.6*  ALBUMIN 2.3*   No results for input(s): LIPASE, AMYLASE in the last 168 hours. No results for input(s): AMMONIA in the last 168 hours.  ABG No results found for: PHART, PCO2ART, PO2ART, HCO3, TCO2, ACIDBASEDEF, O2SAT   Coagulation Profile: No results for input(s): INR, PROTIME in the last 168 hours.  Cardiac Enzymes: No results for input(s): CKTOTAL, CKMB, CKMBINDEX, TROPONINI in the last 168 hours.  HbA1C: No results found for: HGBA1C  CBG: No results for input(s): GLUCAP in the last 168 hours.  Review of Systems:   Too confused to answer  Past Medical History  She,  has a past medical history of Cirrhosis (HCC) and Thyroid disease.   Surgical History   History reviewed. No pertinent surgical history.   Social History   reports that she quit smoking about 38 years ago. She has never used smokeless tobacco. She reports that she does not drink alcohol and does not use drugs.   Family History   Her family history is not on file.   Allergies No Known Allergies   Home Medications  Prior to Admission medications   Medication Sig Start Date End Date Taking? Authorizing Provider  chlorthalidone (HYGROTON) 25 MG tablet Take 1 tablet (25 mg total) by mouth daily. 01/01/12 12/31/12  Dois Davenport, MD  levothyroxine (SYNTHROID, LEVOTHROID) 100 MCG tablet Take 100 mcg by mouth daily.    [provider]     Critical care time: 36 minutes not including any separately billable procedures

## 2020-11-23 NOTE — ED Triage Notes (Signed)
Pt BIB [facility] from home, daughter called 911 for 2 day decline in weakness, appetite and increase confusion. Pt was incontinent of urine, daughter cleaned pt up PTA. Pt normally alert and ambulatory. unable to get around the last few days. Daughter noted pt more paler than normal, pt w/a syncopal episode when EMS attempted to transfer pt from chair to stretcher. EMS reports approx 2 min episode of unresponsiveness and snoring respirations. Hx of jaundice d/t liver failure. EMS reports pt smells of urine.    Cap 20 90% RA, 94% 2L [place]  SBP 106 HR 80 RR 18 CBG 198 Temp 95  18g LAC

## 2020-11-23 NOTE — Progress Notes (Addendum)
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 Myxedema coma vs hepatic enceph No repeat labs yet, await CBC & INR  Eldrick Penick V. Kaimana Neuzil MD   Transfuse 2 U PRBC for Hb 5.6   12:58 AM

## 2020-11-23 NOTE — Procedures (Signed)
Central Venous Catheter Insertion Procedure Note  Maryann Mccall  [id number]  [birth year]  Date:11/23/20  Time:6:45 PM   Provider Performing:Kirat Mezquita Erby Pian   Procedure: Insertion of Non-tunneled Central Venous Catheter(36556) with US guidance ([id number])   Indication(s) Medication administration  Consent Unable to obtain consent due to emergent nature of procedure.  Anesthesia Topical only with 1% lidocaine   Timeout Verified patient identification, verified procedure, site/side was marked, verified correct patient position, special equipment/implants available, medications/allergies/relevant history reviewed, required imaging and test results available.  Sterile Technique Maximal sterile technique including full sterile barrier drape, hand hygiene, sterile gown, sterile gloves, mask, hair covering, sterile ultrasound probe cover (if used).  Procedure Description Area of catheter insertion was cleaned with chlorhexidine and draped in sterile fashion.  With real-time ultrasound guidance a central venous catheter was placed into the left internal jugular vein. Nonpulsatile blood flow and easy flushing noted in all ports.  The catheter was sutured in place and sterile dressing applied.  Complications/Tolerance None; patient tolerated the procedure well. Chest X-ray is ordered to verify placement for internal jugular or subclavian cannulation.   Chest x-ray is not ordered for femoral cannulation.  EBL Minimal  Specimen(s) None

## 2020-11-23 NOTE — Consult Note (Addendum)
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  --Bedside EGD for diagnosis and possible treatment of upper GI bleeding; risk, benefits and alternatives discussed with the daughter by phone who is agreeable and wishes for [place] to proceed --Continue octreotide and pantoprazole infusion -- If gastric varices are found to be source of bleeding we will need to consult IR for consideration of BRTO versus TIPS -- She is on transfusion protocol, hemoglobin not yet ascertained; goal hemoglobin 7.0 -- Await INR, she may need FFP -- Antibiotics x5 days for prophylaxis of SBP in the setting of upper GI bleeding -- Vasopressors as needed per CCM for shock -- Eventual abdominal imaging --Eventually begin lactulose per tube for probable hepatic encephalopathy in the setting of upper GI bleed and cirrhosis  Addendum: Pictures from EGD are attached to the procedure note (rather than in the provation report due to image processor error).  HPI:     Kaylee King is a 80 y.o. 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  Her daughter states that she moved back to [place] from the [place] area in April 2020.  Since this time she has not had medical care.  She had been in her usual state of health which was up and about the home, conversant and minimally to moderately active at home until today.  Today she was dizzy, had trouble sitting up without feeling more dizzy and confused.  No known diarrhea or melena at home.  No vomiting at home.  She had intermittently complained of abdominal pain but nothing consistent.  Used Pepto-Bismol on occasion.  In the ER she was noted to be confused and hypotensive.  She then had coffee ground and maroon-colored hematemesis.  They were unable to obtain a hemoglobin but started rapid transfusion and hemorrhage protocol.  She has been started on Levophed and intubated for airway protection.  She is on a Precedex and fentanyl infusion.  Octreotide and pantoprazole infusions have been started.  Creatinine is slightly above baseline though we do not truly know her baseline of late.  Likely acute kidney injury. TSH markedly elevated INR is still unknown COVID is negative  Endoscopic Hx -- February 21 2017: EGD and ERCP:  EGD: FINDINGS: Normal duodenum with a plastic stent emerging from the major papilla. Mild diffuse mosaic pattern mucosa in the stomach. One isolated small fundus gastric varix on retroflexion Small hiatal hernia from 36 to 38 cm from the incisors Two columns of grade 1 esophageal varices which flatten out with insufflation noted in the lower esophagus.  SPECIMENS SUBMITTED:  None  ENDOSCOPIC DIAGNOSIS: Plastic stent in the second portion of the duodenum. Portal hypertensive gastropathy Isolated gastric varices in the the fundus Grade 1 esophageal varices  COMMENTS:  RECOMMENDATIONS: Proceed with ERCP Repeapt EGD in 3 years for variceal surveillance.     ERCP: ENDOSCOPIC DIAGNOSIS Choledocholithiasis s/p removal of biliary duct stent,  sphincterotomy and removal of 12 stones Post sphincterotomy bleeding and hemostasis achieved.  OUTSIDE IMAGING by CT abd:  FEB 2018  IMPRESSION:  Interval removal of previously noted drainage catheter from the left hepatic lobe.  Heterogeneous enhancement, decreased in size in the left hepatic lobe at the site of previously noted drainage catheter. This may likely reflects a residual edematous change or phlegmon. Clusters of small fluid collections not excluded.  CBD stent with mild intrahepatic and extra hepatic biliary ductal dilation.  Thrombosed portal veins with cavernous transformation.  Pericholecystic fluid with gallbladder wall enhancement, this may be reactive to hepatic inflammatory change or cholangitis.  [facility] WITH [facility]AST:   HISTORY:  Liver abscesses   COMPARISON: CT of abdomen and pelvis from 11/28/2016   CONTRAST:  150 mL of Omnipaque 300.   TECHNIQUE: Spiral CT performed from the dome of the diaphragm to the iliac crest following IV and oral contrast.    Dose reduction technique was used on this scan by utilizing automated exposure control, adjustment of the mA and/or kV according to the patient size.   DLP:  205.1 mGy-cm.   FINDINGS:  Motion artifact limits examination.   Hepatobiliary: There is a focal region of hypoenhancement within segment 2 coursing along the portal triads measuring 4.4 x 3.1 cm at the site of previously noted drainage catheter. Previously region of hypoenhancement measuring up to 6.5 cm. A discrete loculated fluid collection in this region is not seen. There is periportal edema. There is mild intrahepatic biliary ductal dilation. A CBD stent is present. There is nonenhancement of the main, right and left portal veins related to thrombosis. There are prominent vessels in the porta hepatis, collateral veins. Pancreas normal morphology and duct caliber. Gallbladder is mildly distended with  enhancement of the wall and surrounding edema. No radiopaque stones. Normal splenic parenchyma.   GU: Bilateral symmetrical renal parenchymal enhancement, without solid mass, hydronephrosis or nephrolithiasis.     Normal ureteral caliber. No intraluminal obstruction or calculus. Normal adrenal morphology.     GI: No evidence for bowel obstruction. No mesenteric stranding or wall thickening. No intraperitoneal adenopathy, masses or fluid.  Moderate volume colonic stool burden.   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   Musculoskeletal: No focal lytic or blastic lesion. No fracture or acute derangement. Degenerative changes noted in the lumbar spine.     Past Medical History:  Diagnosis Date  . Cirrhosis (HCC)   . Thyroid disease    hypothyroid     History reviewed. No pertinent surgical history.  Prior to Admission medications   Medication Sig Start Date End Date Taking? Authorizing Provider  chlorthalidone (HYGROTON) 25 MG tablet Take 1 tablet (25 mg total) by mouth daily. 01/01/12 12/31/12  Dois Davenportichter, Karen L, MD  levothyroxine (SYNTHROID, LEVOTHROID) 100 MCG tablet Take 100 mcg by mouth daily.    [provider]    Current Facility-Administered Medications  Medication Dose Route Frequency Provider Last Rate Last Admin  . 0.9 %  sodium chloride infusion  10 mL/hr Intravenous Once Arby BarrettePfeiffer, Marcy, MD      .  cefTRIAXone (ROCEPHIN) 2 g in sodium chloride 0.9 % 100 mL IVPB  2 g Intravenous Q24H Lorin Glass, MD      . dexmedetomidine (PRECEDEX) 400 MCG/100ML (4 mcg/mL) infusion  0-1.2 mcg/kg/hr Intravenous Continuous Lorin Glass, MD      . docusate (COLACE) 50 MG/5ML liquid 100 mg  100 mg Per Tube BID Lorin Glass, MD      . docusate sodium (COLACE) capsule 100 mg  100 mg Oral BID PRN Lorin Glass, MD      . fentaNYL (SUBLIMAZE) injection 25 mcg  25 mcg Intravenous Q15 min PRN Lorin Glass, MD       . fentaNYL (SUBLIMAZE) injection 25-100 mcg  25-100 mcg Intravenous Q30 min PRN Lorin Glass, MD   100 mcg at 11/23/20 1748  . fentaNYL in NS (42mcg/ml) infusion-PREMIX  25-200 mcg/hr Intravenous Continuous Pollyann Savoy, MD 2.5 mL/hr at 11/23/20 1757 25 mcg/hr at 11/23/20 1757  . insulin aspart (novoLOG) injection 0-24 Units  0-24 Units Subcutaneous Q4H Lorin Glass, MD      . levothyroxine (SYNTHROID, LEVOTHROID) injection 50 mcg  50 mcg Intravenous Daily Lorin Glass, MD      . norepinephrine (LEVOPHED) 4mg  in premix infusion  0-40 mcg/min Intravenous Continuous , MD 75 mL/hr at 11/23/20 1819 20 mcg/min at 11/23/20 1819  . octreotide (SANDOSTATIN) 500 mcg in sodium chloride 0.9 % 250 mL (2 mcg/mL) infusion  50 mcg/hr Intravenous Continuous 11/25/20, MD      . pantoprazole (PROTONIX) 80 mg in sodium chloride 0.9 % 100 mL (0.8 mg/mL) infusion  8 mg/hr Intravenous Continuous Lorin Glass, MD      . pantoprazole (PROTONIX) 80 mg in sodium chloride 0.9 % 100 mL IVPB  80 mg Intravenous Once Pollyann Savoy B, MD      . polyethylene glycol (MIRALAX / GLYCOLAX) packet 17 g  17 g Oral Daily PRN Susy Frizzle, MD      . polyethylene glycol (MIRALAX / GLYCOLAX) packet 17 g  17 g Per Tube Daily Lorin Glass, MD      . sodium bicarbonate 1 mEq/mL injection           . sodium bicarbonate 150 mEq in sterile water 1,000 mL infusion   Intravenous Continuous Lorin Glass, MD      . sodium bicarbonate injection 100 mEq  100 mEq Intravenous Once Lorin Glass, MD       Current Outpatient Medications  Medication Sig Dispense Refill  . chlorthalidone (HYGROTON) 25 MG tablet Take 1 tablet (25 mg total) by mouth daily. 30 tablet 1  . levothyroxine (SYNTHROID, LEVOTHROID) 100 MCG tablet Take 100 mcg by mouth daily.      Allergies as of 11/23/2020  . (No Known Allergies)    History reviewed. No pertinent family history.  Social History    Tobacco Use  . Smoking status: Former Smoker    Quit date: 12/31/1981    Years since quitting: 38.9  . Smokeless tobacco: Never Used  Substance Use Topics  . Alcohol use: Never  . Drug use: Never    Review of Systems: All systems reviewed and negative except where noted in HPI.  Physical Exam: Vital signs in last 24 hours: Temp:  [97.2 F (36.2 C)-98.3 F (36.8 C)] 98.3 F (36.8 C) (01/20 1750) Pulse Rate:  [80-91] 88 (01/20 1815) Resp:  [12-33] 18 (01/20 1815) BP: (78-174)/(39-83) 149/74 (  01/20 1815) SpO2:  [94 %-100 %] 94 % (01/20 1815) Weight:  [92.5 kg] 92.5 kg (01/20 1108)   Gen: Sedated elderly, pale female  HEENT: anicteric, ET tube in place, blood around nares CV: RRR, 2/6 sem Pulm: Coarse breath sounds anteriorly bilaterally Abd: soft, nondistended Ext: no c/c/e Neuro: Sedated    Intake/Output from previous day: No intake/output data recorded. Intake/Output this shift: Total I/O In: 1500 [IV Piggyback:1500] Out: -   Lab Results: No results for input(s): WBC, HGB, HCT, PLT in the last 72 hours. BMET Recent Labs    11/23/20 1100  NA 143  K 4.2  CL 112*  CO2 18*  GLUCOSE 136*  BUN 45*  CREATININE 1.79*  CALCIUM 8.1*   LFT Recent Labs    11/23/20 1100  PROT 5.6*  ALBUMIN 2.3*  AST 29  ALT 13  ALKPHOS 93  BILITOT 0.9   PT/INR No results for input(s): LABPROT, INR in the last 72 hours. Hepatitis Panel No results for input(s): HEPBSAG, HCVAB, HEPAIGM, HEPBIGM in the last 72 hours.   .No flowsheet data found.  . CMP Latest Ref Rng & Units 11/23/2020 01/01/2012  Glucose 70 - 99 mg/dL 914(N136(H) 829(F109(H)  BUN 8 - 23 mg/dL 62(Z45(H) 13  Creatinine 3.080.44 - 1.00 mg/dL 6.57(Q1.79(H) 4.690.98  Sodium 629135 - 145 mmol/L 143 141  Potassium 3.5 - 5.1 mmol/L 4.2 4.4  Chloride 98 - 111 mmol/L 112(H) 101  CO2 22 - 32 mmol/L 18(L) 26  Calcium 8.9 - 10.3 mg/dL 8.1(L) 9.8  Total Protein 6.5 - 8.1 g/dL 5.2(W5.6(L) 8.3  Total Bilirubin 0.3 - 1.2 mg/dL 0.9 0.8  Alkaline  Phos 38 - 126 U/L 93 67  AST 15 - 41 U/L 29 20  ALT 0 - 44 U/L 13 11   Studies/Results: CT Head Wo Contrast  Result Date: 11/23/2020 CLINICAL DATA:  Headache, altered mental status. EXAM: CT HEAD WITHOUT CONTRAST TECHNIQUE: Contiguous axial images were obtained from the base of the skull through the vertex without intravenous contrast. COMPARISON:  None. FINDINGS: Brain: The brainstem, cerebellum, cerebral peduncles, thalami, basal ganglia, basilar cisterns, and ventricular system appear within normal limits. Periventricular white matter and corona radiata hypodensities favor chronic ischemic microvascular white matter disease. No intracranial hemorrhage, mass lesion, or acute CVA. Vascular: There is atherosclerotic calcification of the cavernous carotid arteries bilaterally. Skull: Unremarkable Sinuses/Orbits: Unremarkable Other: No supplemental non-categorized findings. IMPRESSION: 1. No acute intracranial findings. 2. Periventricular white matter and corona radiata hypodensities favor chronic ischemic microvascular white matter disease. Electronically Signed   By: Gaylyn RongWalter  Liebkemann M.D.   On: 11/23/2020 13:55   DG Chest Portable 1 View  Result Date: 11/23/2020 CLINICAL DATA:  Endotracheal tube placement. EXAM: PORTABLE CHEST 1 VIEW COMPARISON:  None. FINDINGS: An endotracheal tube is seen with its distal tip approximately 4.1 cm from the carina. Mild atelectasis and/or infiltrate is seen within the left lung base. This is increased in severity when compared to the prior study. There is no evidence of a pleural effusion or pneumothorax. The heart size and mediastinal contours are within normal limits. The visualized skeletal structures are unremarkable. IMPRESSION: 1. Endotracheal tube in good position. 2. Mild left basilar atelectasis and/or infiltrate, increased in severity when compared to the prior study. Electronically Signed   By: Aram Candelahaddeus  Houston M.D.   On: 11/23/2020 18:05   DG Chest Port 1  View  Result Date: 11/23/2020 CLINICAL DATA:  Weakness declined appetite increased confusion x2 days EXAM: PORTABLE CHEST 1 VIEW COMPARISON:  None. FINDINGS: The heart size and mediastinal contours are mildly enlarged, accentuated by technique. Aortic atherosclerosis. Streaky left lower lobe opacity. No pleural effusion or pneumothorax. Thoracic spondylosis and bilateral AC joints DJD. IMPRESSION: Streaky left lower lobe opacity, likely atelectasis but pneumonia not excluded. Electronically Signed   By: Maudry Mayhew MD   On: 11/23/2020 13:07    Active Problems:   Acute metabolic encephalopathy    Carie Caddy. Rhea Belton, M.D. @  11/23/2020, 6:28 PM

## 2020-11-23 NOTE — ED Notes (Signed)
Per daughter at bedside opt has been c/o headache since before christmas, became progressively worse over the last 2 days associated with pt moaning and lethargic

## 2020-11-23 NOTE — Progress Notes (Signed)
Pt transported from ED [facility] to 4N 29 without complication. Pt respiratory status remained stable throughout transport. RT will continue to monitor.

## 2020-11-23 NOTE — Op Note (Addendum)
[facility] Patient Name: Kaylee King Procedure Date : 11/23/2020 MRN: [id number] Attending MD: Beverley Fiedler , MD Date of Birth: [birth year] CSN: [id number] Age: 80 Admit Type: Inpatient Procedure:                Upper GI endoscopy Indications:              Hematemesis, Active gastrointestinal bleeding,                            history of cirrhosis with portal HTN and known                            gastroesophageal varices Providers:                Carie Caddy. Rhea Belton, MD, Roselie Awkward, RN, Melany Guernsey, Technician Referring MD:             Vinnie Level. Smith (PCCM) Medicines:                Sedative infusion per ED and PCCM teams (Precedex                            and Fentanyl gtt) Complications:            No immediate complications. Estimated Blood Loss:     Estimated blood loss: none. Procedure:                Pre-Anesthesia Assessment:                           - Prior to the procedure, a History and Physical                            was performed, and patient medications and                            allergies were reviewed. The patient's tolerance of                            previous anesthesia was also reviewed. The risks                            and benefits of the procedure and the sedation                            options and risks were discussed with the patient.                            All questions were answered, and informed consent                            was obtained. Prior Anticoagulants: The patient has  taken no previous anticoagulant or antiplatelet                            agents. ASA Grade Assessment: IV - A patient with                            severe systemic disease that is a constant threat                            to life. After reviewing the risks and benefits,                            the patient was deemed in satisfactory condition to                             undergo the procedure.                           After obtaining informed consent, the endoscope was                            passed under direct vision. Throughout the                            procedure, the patient's blood pressure, pulse, and                            oxygen saturations were monitored continuously. The                            [id number]-[id number] ([id number] Olympus therapeutic                            gastroscope was introduced through the mouth, and                            advanced to the second part of duodenum. The                            GIF-[id number] ([id number] Olympus gastroscope was                            introduced through the mouth, and advanced to the.                            The upper GI endoscopy was accomplished without                            difficulty. The patient tolerated the procedure                            well. Scope In: Scope Out: Findings:      Four columns of grade III (large) varices were found in the middle third  of the esophagus and in the lower third of the esophagus, 30 to 40 cm       from the incisors. Red wale signs were present in the distal esophagus.       Six bands were successfully placed with eradication, resulting in       deflation of varices. There was no bleeding at the end of the maneuver.      Clotted blood was found in the gastric fundus. Large clots were removed       with Lucina Mellowoth net via the mouth. The remaining clot was advanced into the       duodenum.      Type 1 gastroesophageal varices (GOV1, esophageal varices which extend       along the lesser curvature) with no bleeding were found in the gastric       fundus. There were no stigmata of recent bleeding. They were medium in       largest diameter.      Three non-bleeding cratered gastric ulcers with no stigmata of bleeding       were found on the greater curvature of the proximal gastric body (1) and       in the gastric antrum (2). The largest  lesion was 20 mm (antrum)in       largest dimension. 5 mm antral ulcer and 12 mm proximal gastric body       ulcer.      The examined duodenum was normal. Impression:               - Grade III esophageal varices. Most likely source                            of today's GI bleeding. Banded x 6.                           - Clotted blood in the gastric fundus, removed as                            above.                           - Type 1 gastroesophageal varices (GOV1, esophageal                            varices which extend along the lesser curvature),                            without bleeding.                           - Non-bleeding gastric ulcers x 3 with no stigmata                            of bleeding.                           - Normal examined duodenum.                           - No specimens collected.                           -  Images not obtained via [employer], but will be                            added to [facility] for reference. Moderate Sedation:      N/A Recommendation:           - Return patient to ICU for ongoing care.                           - NPO.                           - Continue present medications.                           - Continue octreotide gtt and pantoprazole gtt x 72                            hours.                           - Follow-up response to pRBCs and repeat as needed                            to maintain Hgb 7.0                           - IV antibiotics.                           - Check H. Pylori Ab and treat on discharge if                            positive.                           - Liver imaging when stable.                           - Discussed with the patient's daughter, Kaylee King, by                            phone.                           - GI consult service will follow. Procedure Code(s):        --- Professional ---                           [phone], Esophagogastroduodenoscopy, flexible,                            transoral;  with band ligation of esophageal/gastric                            varices Diagnosis Code(s):        --- Professional ---  I85.00, Esophageal varices without bleeding                           K92.2, Gastrointestinal hemorrhage, unspecified                           I86.4, Gastric varices                           K25.9, Gastric ulcer, unspecified as acute or                            chronic, without hemorrhage or perforation                           K92.0, Hematemesis CPT copyright 2019 [employer]. All rights reserved. The codes documented in this report are preliminary and upon coder review may  be revised to meet current compliance requirements. Beverley Fiedler, MD 11/23/2020 9:12:09 PM This report has been signed electronically. Number of Addenda: 0

## 2020-11-24 ENCOUNTER — Inpatient Hospital Stay (HOSPITAL_COMMUNITY): Payer: Medicare Other

## 2020-11-24 ENCOUNTER — Encounter (HOSPITAL_COMMUNITY): Payer: Self-pay | Admitting: Internal Medicine

## 2020-11-24 DIAGNOSIS — K257 Chronic gastric ulcer without hemorrhage or perforation: Secondary | ICD-10-CM

## 2020-11-24 DIAGNOSIS — K7460 Unspecified cirrhosis of liver: Secondary | ICD-10-CM

## 2020-11-24 DIAGNOSIS — K7581 Nonalcoholic steatohepatitis (NASH): Secondary | ICD-10-CM

## 2020-11-24 DIAGNOSIS — K920 Hematemesis: Secondary | ICD-10-CM

## 2020-11-24 DIAGNOSIS — D62 Acute posthemorrhagic anemia: Secondary | ICD-10-CM | POA: Diagnosis not present

## 2020-11-24 DIAGNOSIS — I864 Gastric varices: Secondary | ICD-10-CM

## 2020-11-24 DIAGNOSIS — I8511 Secondary esophageal varices with bleeding: Secondary | ICD-10-CM | POA: Diagnosis not present

## 2020-11-24 DIAGNOSIS — G9341 Metabolic encephalopathy: Secondary | ICD-10-CM | POA: Diagnosis not present

## 2020-11-24 LAB — CBC
HCT: 25.5 % — ABNORMAL LOW (ref 36.0–46.0)
HCT: 25.6 % — ABNORMAL LOW (ref 36.0–46.0)
HCT: 24.7 % — ABNORMAL LOW (ref 36.0–46.0)
HCT: 26.2 % — ABNORMAL LOW (ref 36.0–46.0)
Hemoglobin: 8.2 g/dL — ABNORMAL LOW (ref 12.0–15.0)
Hemoglobin: 8.8 g/dL — ABNORMAL LOW (ref 12.0–15.0)
Hemoglobin: 8.3 g/dL — ABNORMAL LOW (ref 12.0–15.0)
Hemoglobin: 8.4 g/dL — ABNORMAL LOW (ref 12.0–15.0)
MCH: 28.5 pg (ref 26.0–34.0)
MCH: 29.2 pg (ref 26.0–34.0)
MCH: 29.1 pg (ref 26.0–34.0)
MCH: 28.2 pg (ref 26.0–34.0)
MCHC: 32.2 g/dL (ref 30.0–36.0)
MCHC: 34.4 g/dL (ref 30.0–36.0)
MCHC: 33.6 g/dL (ref 30.0–36.0)
MCHC: 32.1 g/dL (ref 30.0–36.0)
MCV: 88.5 fL (ref 80.0–100.0)
MCV: 85 fL (ref 80.0–100.0)
MCV: 86.7 fL (ref 80.0–100.0)
MCV: 87.9 fL (ref 80.0–100.0)
Platelets: 248 10*3/uL (ref 150–400)
Platelets: 278 10*3/uL (ref 150–400)
Platelets: 202 10*3/uL (ref 150–400)
Platelets: 199 10*3/uL (ref 150–400)
RBC: 2.88 MIL/uL — ABNORMAL LOW (ref 3.87–5.11)
RBC: 3.01 MIL/uL — ABNORMAL LOW (ref 3.87–5.11)
RBC: 2.85 MIL/uL — ABNORMAL LOW (ref 3.87–5.11)
RBC: 2.98 MIL/uL — ABNORMAL LOW (ref 3.87–5.11)
RDW: 16.2 % — ABNORMAL HIGH (ref 11.5–15.5)
RDW: 16.4 % — ABNORMAL HIGH (ref 11.5–15.5)
RDW: 16.7 % — ABNORMAL HIGH (ref 11.5–15.5)
RDW: 17 % — ABNORMAL HIGH (ref 11.5–15.5)
WBC: 13 10*3/uL — ABNORMAL HIGH (ref 4.0–10.5)
WBC: 14.1 10*3/uL — ABNORMAL HIGH (ref 4.0–10.5)
WBC: 10.7 10*3/uL — ABNORMAL HIGH (ref 4.0–10.5)
WBC: 8.5 10*3/uL (ref 4.0–10.5)
nRBC: 2.8 % — ABNORMAL HIGH (ref 0.0–0.2)
nRBC: 2.8 % — ABNORMAL HIGH (ref 0.0–0.2)
nRBC: 3.7 % — ABNORMAL HIGH (ref 0.0–0.2)
nRBC: 4.1 % — ABNORMAL HIGH (ref 0.0–0.2)

## 2020-11-24 LAB — CBC WITH DIFFERENTIAL/PLATELET
Abs Immature Granulocytes: 0.17 10*3/uL — ABNORMAL HIGH (ref 0.00–0.07)
Basophils Absolute: 0 10*3/uL (ref 0.0–0.1)
Basophils Relative: 0 %
Eosinophils Absolute: 0 10*3/uL (ref 0.0–0.5)
Eosinophils Relative: 0 %
HCT: 17.9 % — ABNORMAL LOW (ref 36.0–46.0)
Hemoglobin: 5.6 g/dL — CL (ref 12.0–15.0)
Immature Granulocytes: 1 %
Lymphocytes Relative: 4 %
Lymphs Abs: 0.5 10*3/uL — ABNORMAL LOW (ref 0.7–4.0)
MCH: 27.7 pg (ref 26.0–34.0)
MCHC: 31.3 g/dL (ref 30.0–36.0)
MCV: 88.6 fL (ref 80.0–100.0)
Monocytes Absolute: 0.6 10*3/uL (ref 0.1–1.0)
Monocytes Relative: 5 %
Neutro Abs: 11.7 10*3/uL — ABNORMAL HIGH (ref 1.7–7.7)
Neutrophils Relative %: 90 %
Platelets: 256 10*3/uL (ref 150–400)
RBC: 2.02 MIL/uL — ABNORMAL LOW (ref 3.87–5.11)
RDW: 18.1 % — ABNORMAL HIGH (ref 11.5–15.5)
WBC: 13 10*3/uL — ABNORMAL HIGH (ref 4.0–10.5)
nRBC: 1.5 % — ABNORMAL HIGH (ref 0.0–0.2)

## 2020-11-24 LAB — PREPARE FRESH FROZEN PLASMA: Unit division: 0

## 2020-11-24 LAB — BPAM FFP
Blood Product Expiration Date: 202201252359
Blood Product Expiration Date: 202201252359
ISSUE DATE / TIME: 202201202106
ISSUE DATE / TIME: 202201202229
Unit Type and Rh: 6200
Unit Type and Rh: 6200

## 2020-11-24 LAB — PROTIME-INR
INR: 1.3 — ABNORMAL HIGH (ref 0.8–1.2)
Prothrombin Time: 15.6 seconds — ABNORMAL HIGH (ref 11.4–15.2)

## 2020-11-24 LAB — GLUCOSE, CAPILLARY
Glucose-Capillary: 119 mg/dL — ABNORMAL HIGH (ref 70–99)
Glucose-Capillary: 122 mg/dL — ABNORMAL HIGH (ref 70–99)
Glucose-Capillary: 126 mg/dL — ABNORMAL HIGH (ref 70–99)
Glucose-Capillary: 133 mg/dL — ABNORMAL HIGH (ref 70–99)
Glucose-Capillary: 143 mg/dL — ABNORMAL HIGH (ref 70–99)
Glucose-Capillary: 150 mg/dL — ABNORMAL HIGH (ref 70–99)

## 2020-11-24 LAB — BASIC METABOLIC PANEL
Anion gap: 16 — ABNORMAL HIGH (ref 5–15)
BUN: 45 mg/dL — ABNORMAL HIGH (ref 8–23)
CO2: 23 mmol/L (ref 22–32)
Calcium: 7.6 mg/dL — ABNORMAL LOW (ref 8.9–10.3)
Chloride: 108 mmol/L (ref 98–111)
Creatinine, Ser: 1.59 mg/dL — ABNORMAL HIGH (ref 0.44–1.00)
GFR, Estimated: 32 mL/min — ABNORMAL LOW (ref >60)
Glucose, Bld: 127 mg/dL — ABNORMAL HIGH (ref 70–99)
Potassium: 3.5 mmol/L (ref 3.5–5.1)
Sodium: 147 mmol/L — ABNORMAL HIGH (ref 135–145)

## 2020-11-24 LAB — PROCALCITONIN: Procalcitonin: 0.28 ng/mL

## 2020-11-24 LAB — LACTIC ACID, PLASMA
Lactic Acid, Venous: 2.9 mmol/L (ref 0.5–1.9)
Lactic Acid, Venous: 3 mmol/L (ref 0.5–1.9)

## 2020-11-24 LAB — IRON AND TIBC
Iron: 74 ug/dL (ref 28–170)
Saturation Ratios: 22 % (ref 10.4–31.8)
TIBC: 340 ug/dL (ref 250–450)
UIBC: 266 ug/dL

## 2020-11-24 LAB — PREPARE RBC (CROSSMATCH)

## 2020-11-24 LAB — PHOSPHORUS: Phosphorus: 3.4 mg/dL (ref 2.5–4.6)

## 2020-11-24 LAB — MRSA PCR SCREENING: MRSA by PCR: NEGATIVE

## 2020-11-24 LAB — FERRITIN: Ferritin: 10 ng/mL — ABNORMAL LOW (ref 11–307)

## 2020-11-24 LAB — CORTISOL: Cortisol, Plasma: 20 ug/dL

## 2020-11-24 LAB — MAGNESIUM: Magnesium: 2.1 mg/dL (ref 1.7–2.4)

## 2020-11-24 MED ORDER — SODIUM CHLORIDE 0.9% IV SOLUTION: Freq: Once | INTRAVENOUS | Status: DC

## 2020-11-24 MED ORDER — SODIUM CHLORIDE 0.9 % IV SOLN
2.0000 g | INTRAVENOUS | Status: AC
  Administered 2020-11-24 – 2020-11-30 (×7): 2 g via INTRAVENOUS
  Filled 2020-11-24 (×3): qty 2
  Filled 2020-11-24 (×2): qty 20
  Filled 2020-11-24: qty 2
  Filled 2020-11-24: qty 20
  Filled 2020-11-24: qty 2

## 2020-11-24 NOTE — Progress Notes (Signed)
[facility] Progress Note  Chief Complaint: Upper GI bleed  History: Chart reviewed, case discussed with my partner Dr. Hilarie Fredrickson who performed upper endoscopy for GI bleeding last night. Patient with cirrhosis, no care for that condition in this area since moving here last year, large-volume hematemesis and melena with severe anemia. Endoscopy revealed large esophageal varices with stigmata of bleeding, banding was performed.  Smaller gastric cardia varices without stigmata of bleeding.  Multiple large cratered gastric ulcers as well.  Dr. Hilarie Fredrickson believed to the varices to have been the source of bleeding, though visualization of the entire stomach was somewhat limited by retained debris and blood.  This patient remains sedated on Precedex on the ventilator this morning.  She does not have an OG tube in, there is reportedly been passage of black stool as expected with this degree of bleeding. Blood pressure has stabilized overnight, hemoglobin stable last 2 values.  ROS: Patient cannot provide any history or review of systems right now since she is intubated and sedated.  Objective:   Current Facility-Administered Medications:  .  0.9 %  sodium chloride infusion (Manually program via Guardrails IV Fluids), , Intravenous, Once, Candee Furbish, MD .  0.9 %  sodium chloride infusion (Manually program via Guardrails IV Fluids), , Intravenous, Once, Rigoberto Noel, MD, Held at 11/24/20 1034 .  0.9 %  sodium chloride infusion, 10 mL/hr, Intravenous, Once, Pfeiffer, Marcy, MD .  0.9 %  sodium chloride infusion, , Intravenous, Continuous, Pyrtle, Lajuan Lines, MD .  cefTRIAXone (ROCEPHIN) 2 g in sodium chloride 0.9 % 100 mL IVPB, 2 g, Intravenous, Q24H, Agarwala, Ravi, MD .  chlorhexidine gluconate (MEDLINE KIT) (PERIDEX) 0.12 % solution 15 mL, 15 mL, Mouth Rinse, BID, Candee Furbish, MD, 15 mL at 11/24/20 0717 .  Chlorhexidine Gluconate Cloth 2 % PADS 6 each, 6 each, Topical, Daily, Candee Furbish, MD .   dexmedetomidine (PRECEDEX) 400 MCG/100ML (4 mcg/mL) infusion, 0-1.2 mcg/kg/hr, Intravenous, Continuous, Candee Furbish, MD, Last Rate: 6.9 mL/hr at 11/24/20 0800, 0.3 mcg/kg/hr at 11/24/20 0800 .  docusate (COLACE) 50 MG/5ML liquid 100 mg, 100 mg, Per Tube, BID, Candee Furbish, MD .  docusate sodium (COLACE) capsule 100 mg, 100 mg, Oral, BID PRN, Candee Furbish, MD .  fentaNYL (SUBLIMAZE) injection 25 mcg, 25 mcg, Intravenous, Q15 min PRN, Candee Furbish, MD .  fentaNYL (SUBLIMAZE) injection 25-100 mcg, 25-100 mcg, Intravenous, Q30 min PRN, Candee Furbish, MD, 100 mcg at 11/23/20 1748 .  fentaNYL 2553mg in NS 2541m(1087mml) infusion-PREMIX, 0-400 mcg/hr, Intravenous, Continuous, SmiCandee FurbishD, Last Rate: 5 mL/hr at 11/24/20 1038, 50 mcg/hr at 11/24/20 1038 .  insulin aspart (novoLOG) injection 0-24 Units, 0-24 Units, Subcutaneous, Q4H, SmiCandee FurbishD .  levothyroxine (SYNTHROID, LEVOTHROID) injection 50 mcg, 50 mcg, Intravenous, Daily, SmiCandee FurbishD, 50 mcg at 11/24/20 1029 .  MEDLINE mouth rinse, 15 mL, Mouth Rinse, 10 times per day, SmiCandee FurbishD, 15 mL at 11/24/20 1222 .  norepinephrine (LEVOPHED) 4mg8m 250mL46mmix infusion, 0-40 mcg/min, Intravenous, Continuous, SmithCandee Furbish Last Rate: 15 mL/hr at 11/24/20 0800, 4 mcg/min at 11/24/20 0800 .  octreotide (SANDOSTATIN) 500 mcg in sodium chloride 0.9 % 250 mL (2 mcg/mL) infusion, 50 mcg/hr, Intravenous, Continuous, SmithCandee Furbish Last Rate: 25 mL/hr at 11/24/20 0800, 50 mcg/hr at 11/24/20 0800 .  pantoprazole (PROTONIX) 80 mg in sodium chloride 0.9 % 100 mL (0.8 mg/mL) infusion, 8 mg/hr, Intravenous,  Continuous, Truddie Hidden, MD, Last Rate: 10 mL/hr at 11/24/20 0800, 8 mg/hr at 11/24/20 0800 .  polyethylene glycol (MIRALAX / GLYCOLAX) packet 17 g, 17 g, Oral, Daily PRN, Candee Furbish, MD .  polyethylene glycol (MIRALAX / GLYCOLAX) packet 17 g, 17 g, Per Tube, Daily, Candee Furbish, MD .   rocuronium [facility]) injection 50 mg, 50 mg, Intravenous, Once, Candee Furbish, MD .  sodium bicarbonate 150 mEq in sterile water 1,000 mL infusion, , Intravenous, Continuous, Candee Furbish, MD, Last Rate: 100 mL/hr at 11/24/20 0800, Infusion Verify at 11/24/20 0800  . sodium chloride    . sodium chloride    . cefTRIAXone (ROCEPHIN)  IV    . dexmedetomidine (PRECEDEX) IV infusion 0.3 mcg/kg/hr (11/24/20 0800)  . fentaNYL infusion INTRAVENOUS 50 mcg/hr (11/24/20 1038)  . norepinephrine (LEVOPHED) Adult infusion 4 mcg/min (11/24/20 0800)  . octreotide  (SANDOSTATIN)    IV infusion 50 mcg/hr (11/24/20 0800)  . pantoprozole (PROTONIX) infusion 8 mg/hr (11/24/20 0800)  .  sodium bicarbonate (isotonic) infusion in sterile water 100 mL/hr at 11/24/20 0800     Vital signs in last 24 hrs: Vitals:   11/24/20 1100 11/24/20 1122  BP: 126/65 126/68  Pulse: (!) 59 (!) 57  Resp: (!) 26 (!) 26  Temp:    SpO2: 96% 96%    Intake/Output Summary (Last 24 hours) at 11/24/2020 1223 Last data filed at 11/24/2020 0800 Gross per 24 hour  Intake 6058.41 ml  Output 825 ml  Net 5233.41 ml     Physical Exam Chronically ill-appearing woman, sedated on Precedex, ET tube in place.  Pale with sallow complexion  HEENT: sclera anicteric, oral mucosa without lesions  Neck: supple, no thyromegaly, JVD or lymphadenopathy  Cardiac: RRR without murmurs, S1S2 heard, no peripheral edema  Pulm: Good air entry bilaterally, no wheezing  Abdomen: soft,with active bowel sounds of normal character. No guarding or palpable hepatosplenomegaly  Skin; warm and dry, no jaundice  Recent Labs:  CBC Latest Ref Rng & Units 11/24/2020 11/24/2020 11/24/2020  WBC 4.0 - 10.5 K/uL 14.1(H) 13.0(H) 13.0(H)  Hemoglobin 12.0 - 15.0 g/dL 8.8(L) 8.2(L) 5.6(LL)  Hematocrit 36.0 - 46.0 % 25.6(L) 25.5(L) 17.9(L)  Platelets 150 - 400 K/uL 278 248 256    Recent Labs  Lab 11/24/20 0018  INR 1.3*   CMP Latest Ref Rng & Units  11/24/2020 11/23/2020 01/01/2012  Glucose 70 - 99 mg/dL 127(H) 136(H) 109(H)  BUN 8 - 23 mg/dL 45(H) 45(H) 13  Creatinine 0.44 - 1.00 mg/dL 1.59(H) 1.79(H) 0.98  Sodium 135 - 145 mmol/L 147(H) 143 141  Potassium 3.5 - 5.1 mmol/L 3.5 4.2 4.4  Chloride 98 - 111 mmol/L 108 112(H) 101  CO2 22 - 32 mmol/L 23 18(L) 26  Calcium 8.9 - 10.3 mg/dL 7.6(L) 8.1(L) 9.8  Total Protein 6.5 - 8.1 g/dL - 5.6(L) 8.3  Total Bilirubin 0.3 - 1.2 mg/dL - 0.9 0.8  Alkaline Phos 38 - 126 U/L - 93 67  AST 15 - 41 U/L - 29 20  ALT 0 - 44 U/L - 13 11     Radiologic studies: EGD report and photographs were reviewed   Assessment & Plan  Assessment: Hematemesis Acute blood loss anemia Kaylee King 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  Multiple chronic cratered gastric ulcers as well, H. pylori serology ordered and results  pending. Hemoglobin stabilized overnight, I believe the bleeding has stopped at this point. Unknown if she has a history of hepatic encephalopathy. No obvious ascites on exam.  Plan: Continue octreotide and Protonix drip for least another 24 hours Every 8 hour hemoglobin and hematocrit x 6  Ammonia level with tomorrow morning's labs Continue ceftriaxone 2 g daily in case patient has ascites in the setting of upper GI bleed. Holding off on abdominal imaging for now, as I do not think it would change our current management and would be logistically difficult to bring this intubated patient off the floor to radiology. I discussed the case with PCCM physician, and they are planning to wean the Precedex today in hopes of extubating this patient.  If they are able to do so, then I would like lactulose to be started at a dose of 20 g 3 times daily for at least 24 hours. If patient cannot be successfully extubated, and if hepatic encephalopathy  considered to be a contributing factor, then she may need orogastric tube placement to administer lactulose.  I hope to avoid this if possible due to the risk of precipitating upper GI bleeding with the recent banding.  This will be reconsidered tomorrow.  We will follow closely throughout this patient's hospital stay.  Total time 35 minutes, extensive chart review and care coordination discussions required.   Kaylee King Office: [phone]

## 2020-11-24 NOTE — Progress Notes (Addendum)
NAME:  Orena Cavazos, MRN:  [id number], DOB:  Sep 09, 1941, LOS: 1 ADMISSION DATE:  11/23/2020, CONSULTATION DATE:  11/23/20 REFERRING MD:  Bernette Mayers CHIEF COMPLAINT:  AMS   Brief History:  58 yoF with history of hypothyroidism noncompliant presenting with progressive headaches, fatigue, confusion, and hypotension of unclear etiology.  History of Present Illness:  65 yoF presented with progressive headaches, fatigue, confusion, and hypotension. Over the past 3 days prior to admission, patient started having a debilitating headache with associated fatigue and confusion. Brought in by EMS, noted to have blood around nares. Patient was unable to provide history, daughter was at bedside. In the ED, had coffee ground/maroon colored hematemesis. Started on levophed and intubated to protect her airway. GI was consulted and she was started on octreotide and PPI infusion.  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 No acute intracranial findings. 2. Periventricular white matter and corona radiata hypodensities favor chronic ischemic microvascular white matter disease.  CXR neg  EDG 1/20- Grade III esophageal varices, banded x6. Type I GE varices without bleeding. Nonbleeding gastric ulcers x3.   Micro Data:  covid negative   Antimicrobials:  Ceftriaxone 1/20 >>  Interim History / Subjective:   Laying in bed, on sedation, NAD.   Objective   Blood pressure (!) 145/70, pulse (!) 56, temperature 98 F (36.7 C), temperature source Axillary, resp. rate (!) 26, height 5\' 4"  (1.626 m), weight 78.1 kg, SpO2 98 %.    Vent Mode:  PRVC FiO2 (%):  [40 %-100 %] 40 % Set Rate:  [18 bmp-26 bmp] 26 bmp Vt Set:  [430 mL] 430 mL PEEP:  [5 cmH20] 5 cmH20 Plateau Pressure:  [15 cmH20-17 cmH20] 17 cmH20   Intake/Output Summary (Last 24 hours) at 11/24/2020 1059 Last data filed at 11/24/2020 0800 Gross per 24 hour  Intake 6058.41 ml  Output 825 ml  Net 5233.41 ml   Filed Weights   11/23/20 1108 11/23/20 2155 11/24/20 0500  Weight: 92.5 kg 78.1 kg 78.1 kg    Examination: General: ill appearing, pale, NAD Lungs: CTA bilaterally, on vent Cardiovascular: regular rhythm, bradycardic, no murmurs  Abdomen: BS+, soft, non-distended, nontender Extremities: warm and dry, no LE edema Neuro: somnolent, unable to follow commands   Resolved Hospital Problem list     Assessment & Plan:   UGIB Hypotension Hx of NASH cirrhosis, portal vein thrombosis  Hypotensive and actively vomited blood on admission, concerning for hemorrhagic shock. Hgb 5.6, now improved to 8.8 s/p 2 units FFP and 4 units PRBC. Bedside EGD showed grade III esophageal varices, banded x6. On octreotide and PPI infusion, antibiotics for SBP prophylaxis, per GI. Will need to consult IR for consideration of BRTO vs TIPS. - Continue pressors for shock - Continue ceftriaxone for 5 days, SBP prophylaxis - Transfuse prn, hgb goal >7.0 - Continue octreotide and pantroprazole infusion  Acute encephalopathy  Likely due to hepatic encephalopathy in setting of GI bleed and cirrhosis. Intubated on arrival due to inability to protect airway, wean from vent as tolerated.Will eventually need to begin lactulose.  - wean from vent support as tolerated   Hypothyroidism  Noncompliant with medications. TSH 51 on  admission.  - Continue IV synthroid   AKI Unknown baseline, Cr 1.79 on admission. Improved to 1.59. - Trend bmp  Best practice (evaluated daily)  Diet: npo Pain/Anxiety/Delirium protocol (if indicated): n/a VAP protocol (if indicated): in place DVT  prophylaxis: SCDs GI prophylaxis: PPI Glucose control: SSI Mobility: bedrest Disposition: ICU  Goals of Care:  Code status- full code.  Labs   CBC: Recent Labs  Lab 11/24/20 0018 11/24/20 0459  WBC 13.0* 13.0*  NEUTROABS 11.7*  --   HGB 5.6* 8.2*  HCT 17.9* 25.5*  MCV 88.6 88.5  PLT 256 248    Basic Metabolic Panel: Recent Labs  Lab 11/23/20 1100 11/23/20 1530 11/24/20 0459  NA 143  --  147*  K 4.2  --  3.5  CL 112*  --  108  CO2 18*  --  23  GLUCOSE 136*  --  127*  BUN 45*  --  45*  CREATININE 1.79*  --  1.59*  CALCIUM 8.1*  --  7.6*  MG  --  2.2 2.1  PHOS  --  3.3 3.4   GFR: Estimated Creatinine Clearance: 28.1 mL/min (A) (by C-G formula based on SCr of 1.59 mg/dL (H)). Recent Labs  Lab 11/23/20 1423 11/24/20 0018 11/24/20 0130 11/24/20 0459  PROCALCITON  --  0.28  --   --   WBC  --  13.0*  --  13.0*  LATICACIDVEN 5.5*  --  2.9*  --     Liver Function Tests: Recent Labs  Lab 11/23/20 1100  AST 29  ALT 13  ALKPHOS 93  BILITOT 0.9  PROT 5.6*  ALBUMIN 2.3*   Recent Labs  Lab 11/23/20 1530  LIPASE 50   No results for input(s): AMMONIA in the last 168 hours.  ABG No results found for: PHART, PCO2ART, PO2ART, HCO3, TCO2, ACIDBASEDEF, O2SAT   Coagulation Profile: Recent Labs  Lab 11/24/20 0018  INR 1.3*    Cardiac Enzymes: Recent Labs  Lab 11/23/20 1530  CKTOTAL 74    HbA1C: No results found for: HGBA1C  CBG: Recent Labs  Lab 11/23/20 2018 11/23/20 2310 11/24/20 0324 11/24/20 0819  GLUCAP 158* 175* 150* 126*    Review of Systems:   Unable to obtain due to patient's mentation.  Past Medical History:  She,  has a past medical history of Cirrhosis (HCC) and Thyroid disease.   Surgical History:  History reviewed. No pertinent surgical history.   Social History:   reports that she quit smoking about 38 years ago. She has never used smokeless tobacco. She reports that she does not drink alcohol and does not use drugs.    Family History:  Her family history is not on file.   Allergies No Known Allergies   Home Medications  Prior to Admission medications   Medication Sig Start Date End Date Taking? Authorizing Provider  chlorthalidone (HYGROTON) 25 MG tablet Take 1 tablet (25 mg total) by mouth daily. 01/01/12 12/31/12  Dois Davenport, MD  levothyroxine (SYNTHROID, LEVOTHROID) 100 MCG tablet Take 100 mcg by mouth daily.    [provider]         Lerry Liner, DO PGY-2 IM

## 2020-11-24 NOTE — Progress Notes (Signed)
Initial Nutrition Assessment  DOCUMENTATION CODES:   Not applicable  INTERVENTION:   No enteral access at present; noted plan to readdress tomorrow if pt remains intubated. If OG/NG placed, recommend initiation of TF  Tube Feeding Recommendations:  Osmolite 1.5 at 60 ml/hr Pro-source TF 45 mL daily Provides 1768 kcals, 91 g of protein and 1166 mL of free water   NUTRITION DIAGNOSIS:   Inadequate oral intake related to acute illness as evidenced by NPO status.  GOAL:   Patient will meet greater than or equal to 90% of their needs  MONITOR:   Vent status,Labs,Weight trends  REASON FOR ASSESSMENT:   Ventilator    ASSESSMENT:   80 yo female admitted with coffee ground/maroon hematemesis with UGIB,shock, acute encephalopathy. PMH NASH cirrhosis, HTN   1/20 Admitted, Intubated, EGD: 4 columns of large/grade 3 esophageal varicessix blands placed, clotted blood in gastric fundus-large clots removed  Pt remains on vent support at present  No NG/OG at present, no plans to place today  Unable to obtain diet and weight history at present  Current wt 78 kg; no recent wt encounters  Labs: sodium 147 (H), Creatinine 1.59 Meds: colace, ss novolog, miralax, sodium bicarb, octreotide and protonix drips   Diet Order:   Diet Order            Diet NPO time specified  Diet effective now                 EDUCATION NEEDS:   Not appropriate for education at this time  Skin:  Skin Assessment: Reviewed RN Assessment  Last BM:  1/20  Height:   Ht Readings from Last 1 Encounters:  11/23/20 5\' 4"  (1.626 m)    Weight:   Wt Readings from Last 1 Encounters:  11/24/20 78.1 kg   BMI:  Body mass index is 29.55 kg/m.  Estimated Nutritional Needs:   Kcal:  1650-1850 kcals  Protein:  85-100 g  Fluid:  >/= 1.7 L   11/26/20 MS, RDN, LDN, CNSC Registered Dietitian III Clinical Nutrition RD Pager and On-Call Pager Number Located in [place]

## 2020-11-25 ENCOUNTER — Inpatient Hospital Stay (HOSPITAL_COMMUNITY): Payer: Medicare Other

## 2020-11-25 DIAGNOSIS — K7581 Nonalcoholic steatohepatitis (NASH): Secondary | ICD-10-CM | POA: Diagnosis not present

## 2020-11-25 DIAGNOSIS — G9341 Metabolic encephalopathy: Secondary | ICD-10-CM | POA: Diagnosis not present

## 2020-11-25 DIAGNOSIS — K257 Chronic gastric ulcer without hemorrhage or perforation: Secondary | ICD-10-CM | POA: Diagnosis not present

## 2020-11-25 DIAGNOSIS — D62 Acute posthemorrhagic anemia: Secondary | ICD-10-CM | POA: Diagnosis not present

## 2020-11-25 DIAGNOSIS — K920 Hematemesis: Secondary | ICD-10-CM | POA: Diagnosis not present

## 2020-11-25 LAB — GLUCOSE, CAPILLARY
Glucose-Capillary: 116 mg/dL — ABNORMAL HIGH (ref 70–99)
Glucose-Capillary: 118 mg/dL — ABNORMAL HIGH (ref 70–99)
Glucose-Capillary: 120 mg/dL — ABNORMAL HIGH (ref 70–99)
Glucose-Capillary: 120 mg/dL — ABNORMAL HIGH (ref 70–99)
Glucose-Capillary: 127 mg/dL — ABNORMAL HIGH (ref 70–99)
Glucose-Capillary: 134 mg/dL — ABNORMAL HIGH (ref 70–99)

## 2020-11-25 LAB — TYPE AND SCREEN
ABO/RH(D): O POS
Antibody Screen: NEGATIVE
Unit division: 0
Unit division: 0
Unit division: 0
Unit division: 0
Unit division: 0

## 2020-11-25 LAB — CBC
HCT: 27.3 % — ABNORMAL LOW (ref 36.0–46.0)
HCT: 24.7 % — ABNORMAL LOW (ref 36.0–46.0)
HCT: 27.5 % — ABNORMAL LOW (ref 36.0–46.0)
Hemoglobin: 8.6 g/dL — ABNORMAL LOW (ref 12.0–15.0)
Hemoglobin: 8 g/dL — ABNORMAL LOW (ref 12.0–15.0)
Hemoglobin: 8.5 g/dL — ABNORMAL LOW (ref 12.0–15.0)
MCH: 28.4 pg (ref 26.0–34.0)
MCH: 29 pg (ref 26.0–34.0)
MCH: 28.5 pg (ref 26.0–34.0)
MCHC: 31.5 g/dL (ref 30.0–36.0)
MCHC: 32.4 g/dL (ref 30.0–36.0)
MCHC: 30.9 g/dL (ref 30.0–36.0)
MCV: 90.1 fL (ref 80.0–100.0)
MCV: 89.5 fL (ref 80.0–100.0)
MCV: 92.3 fL (ref 80.0–100.0)
Platelets: 204 10*3/uL (ref 150–400)
Platelets: 177 10*3/uL (ref 150–400)
Platelets: 206 10*3/uL (ref 150–400)
RBC: 3.03 MIL/uL — ABNORMAL LOW (ref 3.87–5.11)
RBC: 2.76 MIL/uL — ABNORMAL LOW (ref 3.87–5.11)
RBC: 2.98 MIL/uL — ABNORMAL LOW (ref 3.87–5.11)
RDW: 17.2 % — ABNORMAL HIGH (ref 11.5–15.5)
RDW: 17.4 % — ABNORMAL HIGH (ref 11.5–15.5)
RDW: 17.5 % — ABNORMAL HIGH (ref 11.5–15.5)
WBC: 9.2 10*3/uL (ref 4.0–10.5)
WBC: 7 10*3/uL (ref 4.0–10.5)
WBC: 8.5 10*3/uL (ref 4.0–10.5)
nRBC: 3 % — ABNORMAL HIGH (ref 0.0–0.2)
nRBC: 2.3 % — ABNORMAL HIGH (ref 0.0–0.2)
nRBC: 1.8 % — ABNORMAL HIGH (ref 0.0–0.2)

## 2020-11-25 LAB — BPAM RBC
Blood Product Expiration Date: 202202152359
Blood Product Expiration Date: 202202212359
Blood Product Expiration Date: 202202212359
Blood Product Expiration Date: 202202212359
Blood Product Expiration Date: 202202242359
ISSUE DATE / TIME: 202201201624
ISSUE DATE / TIME: 202201201744
ISSUE DATE / TIME: 202201202013
ISSUE DATE / TIME: 202201210111
ISSUE DATE / TIME: 202201210307
Unit Type and Rh: 5100
Unit Type and Rh: 5100
Unit Type and Rh: 5100
Unit Type and Rh: 5100
Unit Type and Rh: 5100

## 2020-11-25 LAB — BASIC METABOLIC PANEL
Anion gap: 14 (ref 5–15)
BUN: 46 mg/dL — ABNORMAL HIGH (ref 8–23)
CO2: 25 mmol/L (ref 22–32)
Calcium: 7.1 mg/dL — ABNORMAL LOW (ref 8.9–10.3)
Chloride: 109 mmol/L (ref 98–111)
Creatinine, Ser: 1.78 mg/dL — ABNORMAL HIGH (ref 0.44–1.00)
GFR, Estimated: 28 mL/min — ABNORMAL LOW (ref >60)
Glucose, Bld: 137 mg/dL — ABNORMAL HIGH (ref 70–99)
Potassium: 3.1 mmol/L — ABNORMAL LOW (ref 3.5–5.1)
Sodium: 148 mmol/L — ABNORMAL HIGH (ref 135–145)

## 2020-11-25 LAB — MAGNESIUM: Magnesium: 2.2 mg/dL (ref 1.7–2.4)

## 2020-11-25 LAB — PHOSPHORUS: Phosphorus: 3.2 mg/dL (ref 2.5–4.6)

## 2020-11-25 LAB — AMMONIA: Ammonia: 29 umol/L (ref 9–35)

## 2020-11-25 MED ORDER — POTASSIUM CHLORIDE 10 MEQ/50ML IV SOLN
10.0000 meq | INTRAVENOUS | Status: AC
Start: 2020-11-25 — End: 2020-11-25
  Administered 2020-11-25 (×4): 10 meq via INTRAVENOUS
  Filled 2020-11-25 (×4): qty 50

## 2020-11-25 MED ORDER — RINGERS IV SOLN: INTRAVENOUS | Status: DC

## 2020-11-25 MED ORDER — LACTATED RINGERS IV BOLUS
500.0000 mL | Freq: Once | INTRAVENOUS | Status: AC
  Administered 2020-11-25: 500 mL via INTRAVENOUS

## 2020-11-25 MED ORDER — HALOPERIDOL LACTATE 5 MG/ML IJ SOLN
INTRAMUSCULAR | Status: AC
  Filled 2020-11-25: qty 1

## 2020-11-25 MED ORDER — HALOPERIDOL LACTATE 5 MG/ML IJ SOLN
2.0000 mg | INTRAMUSCULAR | Status: DC | PRN
  Administered 2020-11-25 – 2020-11-30 (×3): 2 mg via INTRAVENOUS
  Filled 2020-11-25 (×2): qty 1

## 2020-11-25 MED ORDER — LORAZEPAM 2 MG/ML IJ SOLN
2.0000 mg | INTRAMUSCULAR | Status: DC | PRN
  Administered 2020-11-25 – 2020-11-27 (×3): 2 mg via INTRAVENOUS
  Filled 2020-11-25 (×4): qty 1

## 2020-11-25 NOTE — Progress Notes (Signed)
NAME:  Kaylee King, MRN:  [id number], DOB:  02-14-1941, LOS: 2 ADMISSION DATE:  11/23/2020, CONSULTATION DATE:  11/23/20 REFERRING MD:  Bernette Mayers CHIEF COMPLAINT:  AMS   Brief History:  34 yoF with history of hypothyroidism noncompliant presenting with progressive headaches, fatigue, confusion, and hypotension of unclear etiology.  History of Present Illness:  6 yoF presented with progressive headaches, fatigue, confusion, and hypotension. Over the past 3 days prior to admission, patient started having a debilitating headache with associated fatigue and confusion. Brought in by EMS, noted to have blood around nares. Patient was unable to provide history, daughter was at bedside. In the ED, had coffee ground/maroon colored hematemesis. Started on levophed and intubated to protect her airway. GI was consulted and she was started on octreotide and PPI infusion.  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 No acute intracranial findings. 2. Periventricular white matter and corona radiata hypodensities favor chronic ischemic microvascular white matter disease.  CXR basilar atelectasis  EDG 1/20- Grade III esophageal varices, banded x6. Type I GE varices without bleeding. Nonbleeding gastric ulcers x3.   Micro Data:  covid negative   Antimicrobials:  Ceftriaxone 1/20 >>  Interim History / Subjective:   Continues to be agitated and requiring increasing doses of sedation.  Small melena like stools.  But no active emesis.  Precedex stopped due to bradycardia.  Increased oxygen requirement.  Objective   Blood pressure (!)  144/73, pulse 86, temperature 98.7 F (37.1 C), temperature source Axillary, resp. rate (!) 26, height 5\' 4"  (1.626 m), weight 78.1 kg, SpO2 94 %.    Vent Mode: PRVC FiO2 (%):  [40 %-60 %] 60 % Set Rate:  [26 bmp] 26 bmp Vt Set:  [430 mL] 430 mL PEEP:  [5 cmH20-8 cmH20] 8 cmH20 Plateau Pressure:  [18 cmH20-22 cmH20] 22 cmH20   Intake/Output Summary (Last 24 hours) at 11/25/2020 1732 Last data filed at 11/25/2020 1300 Gross per 24 hour  Intake 2229.36 ml  Output 550 ml  Net 1679.36 ml   Filed Weights   11/23/20 1108 11/23/20 2155 11/24/20 0500  Weight: 92.5 kg 78.1 kg 78.1 kg    Examination: General: ill appearing, pale, NAD Lungs: CTA bilaterally, on vent Cardiovascular: regular rhythm, bradycardic, no murmurs  Abdomen: BS+, soft, non-distended, nontender Extremities: warm and dry, no LE edema Neuro: Agitated moving around in bed, not following commands.   Resolved Hospital Problem list     Assessment & Plan:   Critically ill due to acute hypoxic respiratory failure requiring mechanical ventilation.  Mental status precludes extubation. -Full ventilator support. -Increase PEEP for decreased oxygenation  UGIB Hypotension Hx of NASH cirrhosis, portal vein thrombosis  Hypotensive and actively vomited blood on admission, concerning for hemorrhagic shock. Hgb 5.6, now improved to 8.8 s/p 2 units FFP and 4 units PRBC. Bedside EGD showed grade III esophageal varices, banded x6. On octreotide and PPI infusion, antibiotics for SBP prophylaxis, per GI. Will need to consult IR for consideration of BRTO vs TIPS. - Continue pressors for shock - Continue ceftriaxone for 5 days, SBP prophylaxis - Transfuse prn, hgb goal >7.0 - Continue octreotide and pantroprazole infusion  Acute  encephalopathy  Likely due to hepatic encephalopathy in setting of GI bleed and cirrhosis. Intubated on arrival due to inability to protect airway, wean from vent as tolerated.Will eventually need to begin  lactulose.  - wean from vent support as tolerated  -We will try combination of lorazepam/haloperidol for agitation -Consider restarting lactulose once enteral access established  Hypothyroidism  Noncompliant with medications. TSH 51 on admission.  - Continue IV synthroid   AKI Unknown baseline, Cr 1.79 on admission. Improved to 1.59. - Trend bmp -We will bolus fluid  Best practice (evaluated daily)  Diet: npo -Place small bore feeding tube on Monday Pain/Anxiety/Delirium protocol (if indicated): On fentanyl alone VAP protocol (if indicated): in place DVT prophylaxis: SCDs GI prophylaxis: PPI Glucose control: SSI Mobility: bedrest Disposition: ICU Family communication: Unable to reach daughter today  Goals of Care:  Code status- full code.  Labs   CBC: Recent Labs  Lab 11/24/20 0018 11/24/20 0459 11/24/20 1000 11/24/20 1625 11/24/20 2145 11/25/20 0339 11/25/20 1303  WBC 13.0*   < > 14.1* 10.7* 8.5 9.2 7.0  NEUTROABS 11.7*  --   --   --   --   --   --   HGB 5.6*   < > 8.8* 8.3* 8.4* 8.6* 8.0*  HCT 17.9*   < > 25.6* 24.7* 26.2* 27.3* 24.7*  MCV 88.6   < > 85.0 86.7 87.9 90.1 89.5  PLT 256   < > 278 202 199 204 177   < > = values in this interval not displayed.    Basic Metabolic Panel: Recent Labs  Lab 11/23/20 1100 11/23/20 1530 11/24/20 0459 11/25/20 0339  NA 143  --  147* 148*  K 4.2  --  3.5 3.1*  CL 112*  --  108 109  CO2 18*  --  23 25  GLUCOSE 136*  --  127* 137*  BUN 45*  --  45* 46*  CREATININE 1.79*  --  1.59* 1.78*  CALCIUM 8.1*  --  7.6* 7.1*  MG  --  2.2 2.1 2.2  PHOS  --  3.3 3.4 3.2   GFR: Estimated Creatinine Clearance: 25.1 mL/min (A) (by C-G formula based on SCr of 1.78 mg/dL (H)). Recent Labs  Lab 11/23/20 1423 11/24/20 0018 11/24/20 0130 11/24/20 0459 11/24/20 1625 11/24/20 1755 11/24/20 2145 11/25/20 0339 11/25/20 1303  PROCALCITON  --  0.28  --   --   --   --   --   --   --   WBC  --  13.0*  --    < > 10.7*  --  8.5  9.2 7.0  LATICACIDVEN 5.5*  --  2.9*  --   --  3.0*  --   --   --    < > = values in this interval not displayed.    Liver Function Tests: Recent Labs  Lab 11/23/20 1100  AST 29  ALT 13  ALKPHOS 93  BILITOT 0.9  PROT 5.6*  ALBUMIN 2.3*   Recent Labs  Lab 11/23/20 1530  LIPASE 50   No results for input(s): AMMONIA in the last 168 hours.  ABG No results found for: PHART, PCO2ART, PO2ART, HCO3, TCO2, ACIDBASEDEF, O2SAT   Coagulation Profile: Recent Labs  Lab 11/24/20 0018  INR 1.3*    Cardiac Enzymes: Recent Labs  Lab 11/23/20 1530  CKTOTAL 74    HbA1C: No results found for: HGBA1C  CBG: Recent Labs  Lab 11/24/20 2346 11/25/20 0327 11/25/20 0730 11/25/20  1149 11/25/20 1520  GLUCAP 119* 134* 127* 120* 116*   CRITICAL CARE Performed by: Lynnell Catalan   Total critical care time: 40 minutes  Critical care time was exclusive of separately billable procedures and treating other patients.  Critical care was necessary to treat or prevent imminent or life-threatening deterioration.  Critical care was time spent personally by me on the following activities: development of treatment plan with patient and/or surrogate as well as nursing, discussions with consultants, evaluation of patient's response to treatment, examination of patient, obtaining history from patient or surrogate, ordering and performing treatments and interventions, ordering and review of laboratory studies, ordering and review of radiographic studies, pulse oximetry, re-evaluation of patient's condition and participation in multidisciplinary rounds.  Lynnell Catalan, MD [facility] ICU Physician [facility]  Pager: [phone] Mobile: [phone] After hours: [phone].

## 2020-11-25 NOTE — Progress Notes (Addendum)
Daily Rounding Note  11/25/2020, 2:49 PM  LOS: 2 days   SUBJECTIVE:   Chief complaint:   Variceal bleed,  Blood loss anemia   Remains on vent.  Requiring meds for agitation including Haldol Remains on Precedex, fentanyl, Levophed, octreotide Stools dark brown, no red or burgundy blood  OBJECTIVE:         Vital signs in last 24 hours:    Temp:  [97.3 F (36.3 C)-98.7 F (37.1 C)] 98.7 F (37.1 C) (01/22 1200) Pulse Rate:  [52-90] 56 (01/22 1400) Resp:  [16-33] 26 (01/22 1400) BP: (86-146)/(50-91) 119/65 (01/22 1400) SpO2:  [88 %-98 %] 98 % (01/22 1400) FiO2 (%):  [40 %-60 %] 60 % (01/22 1224) Last BM Date: 11/24/20 Filed Weights   11/23/20 1108 11/23/20 2155 11/24/20 0500  Weight: 92.5 kg 78.1 kg 78.1 kg   General: sleeping.  Some restlessness during exam   Heart: RRR Chest: clear bil.  No labored breathing on vent Abdomen: soft, NT, ND.  Did arouse during exam so hard to say if any tenderness.    Extremities: no CCE  Intake/Output from previous day: 01/21 0701 - 01/22 0700 In: 2481.2 [I.V.:2381.2; IV Piggyback:100] Out: 975 [Urine:975]  Intake/Output this shift: Total I/O In: 1254 [I.V.:609.9; IV Piggyback:644.1] Out: -   Lab Results: Recent Labs    11/24/20 2145 11/25/20 0339 11/25/20 1303  WBC 8.5 9.2 7.0  HGB 8.4* 8.6* 8.0*  HCT 26.2* 27.3* 24.7*  PLT 199 204 177   BMET Recent Labs    11/23/20 1100 11/24/20 0459 11/25/20 0339  NA 143 147* 148*  K 4.2 3.5 3.1*  CL 112* 108 109  CO2 18* 23 25  GLUCOSE 136* 127* 137*  BUN 45* 45* 46*  CREATININE 1.79* 1.59* 1.78*  CALCIUM 8.1* 7.6* 7.1*   LFT Recent Labs    11/23/20 1100  PROT 5.6*  ALBUMIN 2.3*  AST 29  ALT 13  ALKPHOS 93  BILITOT 0.9   PT/INR Recent Labs    11/24/20 0018  LABPROT 15.6*  INR 1.3*   Hepatitis Panel No results for input(s): HEPBSAG, HCVAB, HEPAIGM, HEPBIGM in the last 72 hours.  Studies/Results: DG  CHEST PORT 1 VIEW  Result Date: 11/25/2020 CLINICAL DATA:  Respiratory failure, altered mental status EXAM: PORTABLE CHEST 1 VIEW COMPARISON:  11/24/2020 FINDINGS: Endotracheal tube terminates 4.5 cm above the carina. Left IJ venous catheter terminates in the distal left brachiocephalic vein, unchanged. Mild patchy left lower lobe opacity, suspicious for pneumonia. Suspected small bilateral pleural effusions. No pneumothorax. The heart is normal in size.  Thoracic aortic atherosclerosis. IMPRESSION: Endotracheal tube terminates 4.5 cm above the carina. Mild patchy left lower lobe opacity, suspicious for pneumonia. Suspected small bilateral pleural effusions. Electronically Signed   By: Charline Bills M.D.   On: 11/25/2020 11:53   DG Chest Port 1 View  Result Date: 11/24/2020 CLINICAL DATA:  Hypoxia EXAM: PORTABLE CHEST 1 VIEW COMPARISON:  November 23, 2020 FINDINGS: Endotracheal tube tip is 1.4 cm above the carina. Central catheter tip in region of left innominate vein, stable. No pneumothorax. There is ill-defined opacity in the left base, similar to 1 day prior. Areas of atelectatic change noted in each perihilar region. Heart is mildly enlarged with pulmonary vascular normal. No adenopathy. There is aortic atherosclerosis. No bone lesions. IMPRESSION: Tube and catheter positions as described without pneumothorax. Ill-defined opacity left base concerning for focal pneumonia with atelectasis. There is perihilar atelectatic change  as well. Stable cardiac silhouette. Aortic Atherosclerosis (ICD10-I70.0). Electronically Signed   By: Bretta Bang III M.D.   On: 11/24/2020 07:59   DG Chest Portable 1 View  Result Date: 11/23/2020 CLINICAL DATA:  Post central line placement EXAM: PORTABLE CHEST 1 VIEW COMPARISON:  11/23/2020 FINDINGS: Interval intubation, tip of the endotracheal tube is about 3.8 cm superior to the carina. Left-sided central venous catheter with tip projecting over the SVC origin. Low  lung volumes. Airspace disease at the left base. Enlarged cardiomediastinal silhouette with aortic atherosclerosis. No pneumothorax. IMPRESSION: 1. Interval intubation with tip of endotracheal tube about 3.8 cm superior to the carina. Left-sided central venous catheter tip overlies the SVC origin. No pneumothorax 2. Low lung volumes with left basilar airspace disease which may reflect atelectasis, pneumonia or aspiration Electronically Signed   By: Jasmine Pang M.D.   On: 11/23/2020 19:04   DG Chest Portable 1 View  Result Date: 11/23/2020 CLINICAL DATA:  Endotracheal tube placement. EXAM: PORTABLE CHEST 1 VIEW COMPARISON:  None. FINDINGS: An endotracheal tube is seen with its distal tip approximately 4.1 cm from the carina. Mild atelectasis and/or infiltrate is seen within the left lung base. This is increased in severity when compared to the prior study. There is no evidence of a pleural effusion or pneumothorax. The heart size and mediastinal contours are within normal limits. The visualized skeletal structures are unremarkable. IMPRESSION: 1. Endotracheal tube in good position. 2. Mild left basilar atelectasis and/or infiltrate, increased in severity when compared to the prior study. Electronically Signed   By: Aram Candela M.D.   On: 11/23/2020 18:05    ASSESMENT:   *    NASH cirrhosis.  Hx esoph varices.   Not on beta-blockers, diuretics, PPI prior to admission.  *    Esophageal variceal bleed. 11/24/2019 EGD with large esophageal varices, stigmata of bleeding, EVL performed.  Smaller, not on bleeding gastric cardia varices without stigmata.  Multiple large, nonbleeding cratered gastric ulcers.  Esophageal varices felt to be source of hematemesis.  However visualization of stomach limited by retained debris and blood. Rocephin d 2.  Octreotide, Protonix drip in place.  *   Blood loss anemia.  IDA as well w low ferritin.    *    Hx of portal vein thrombosis.  *Gastric varices.  Multiple  large cratered ulcers in the stomach on recent EGD.  There was blood in the stomach limiting visualization, but the bleeding appeared to have been from the esophageal varices that were subsequently banded. H. pylori serology pending. PLAN   *   ? Place OGT for feeding?  *   Ammonia level.   *  Completes 72 h octreotide on 1/23 at 1730 Complete 72 hours PPI drip On 1/23 at 1715    Jennye Moccasin  11/25/2020, 2:49 PM Phone [phone]  I have discussed the case with the PA, and that is the plan I formulated. I personally interviewed and examined the patient.  Atticus is largely unchanged from my evaluation yesterday.  She remains agitated on the ventilator requiring multiple medicines to treat that.  With minimal stimulation she is moving all extremities, opens eyes, looks around but not clearly attentive, is definitely agitated and then becomes sedated.  There has been no overt rebleeding since the endoscopic intervention 2 nights ago.  It is unknown whether hepatic encephalopathy is contributing to her altered mental status.  We will check an ammonia level with morning labs.  It would not be  surprising for her to be modestly elevated.  If her mental status is similar, precluding extubation, and the ammonia is if it is markedly elevated, then we will need to make a difficult decision about placing a small bore gastric or duodenal tube to administer lactulose and nutrition.  There is risk of precipitating upper GI bleeding given the recent esophageal banding.  In the meantime, complete 72-hour IV course of Protonix and octreotide drips.  Serial hemoglobin hematocrit, transfuse for hemoglobin under 7.  Charlie Pitter III Office: [phone]

## 2020-11-26 DIAGNOSIS — D62 Acute posthemorrhagic anemia: Secondary | ICD-10-CM | POA: Diagnosis not present

## 2020-11-26 DIAGNOSIS — K7581 Nonalcoholic steatohepatitis (NASH): Secondary | ICD-10-CM | POA: Diagnosis not present

## 2020-11-26 DIAGNOSIS — K920 Hematemesis: Secondary | ICD-10-CM | POA: Diagnosis not present

## 2020-11-26 DIAGNOSIS — G9341 Metabolic encephalopathy: Secondary | ICD-10-CM | POA: Diagnosis not present

## 2020-11-26 DIAGNOSIS — K257 Chronic gastric ulcer without hemorrhage or perforation: Secondary | ICD-10-CM | POA: Diagnosis not present

## 2020-11-26 LAB — BASIC METABOLIC PANEL
Anion gap: 11 (ref 5–15)
BUN: 36 mg/dL — ABNORMAL HIGH (ref 8–23)
CO2: 24 mmol/L (ref 22–32)
Calcium: 7 mg/dL — ABNORMAL LOW (ref 8.9–10.3)
Chloride: 111 mmol/L (ref 98–111)
Creatinine, Ser: 1.61 mg/dL — ABNORMAL HIGH (ref 0.44–1.00)
GFR, Estimated: 32 mL/min — ABNORMAL LOW (ref >60)
Glucose, Bld: 115 mg/dL — ABNORMAL HIGH (ref 70–99)
Potassium: 3.1 mmol/L — ABNORMAL LOW (ref 3.5–5.1)
Sodium: 146 mmol/L — ABNORMAL HIGH (ref 135–145)

## 2020-11-26 LAB — PHOSPHORUS: Phosphorus: 2 mg/dL — ABNORMAL LOW (ref 2.5–4.6)

## 2020-11-26 LAB — CBC
HCT: 25.9 % — ABNORMAL LOW (ref 36.0–46.0)
HCT: 26.6 % — ABNORMAL LOW (ref 36.0–46.0)
Hemoglobin: 7.9 g/dL — ABNORMAL LOW (ref 12.0–15.0)
Hemoglobin: 8 g/dL — ABNORMAL LOW (ref 12.0–15.0)
MCH: 28.4 pg (ref 26.0–34.0)
MCH: 28.3 pg (ref 26.0–34.0)
MCHC: 30.5 g/dL (ref 30.0–36.0)
MCHC: 30.1 g/dL (ref 30.0–36.0)
MCV: 93.2 fL (ref 80.0–100.0)
MCV: 94 fL (ref 80.0–100.0)
Platelets: 169 10*3/uL (ref 150–400)
Platelets: 152 10*3/uL (ref 150–400)
RBC: 2.78 MIL/uL — ABNORMAL LOW (ref 3.87–5.11)
RBC: 2.83 MIL/uL — ABNORMAL LOW (ref 3.87–5.11)
RDW: 17.5 % — ABNORMAL HIGH (ref 11.5–15.5)
RDW: 17.6 % — ABNORMAL HIGH (ref 11.5–15.5)
WBC: 5.5 10*3/uL (ref 4.0–10.5)
WBC: 5.8 10*3/uL (ref 4.0–10.5)
nRBC: 2.8 % — ABNORMAL HIGH (ref 0.0–0.2)
nRBC: 1.9 % — ABNORMAL HIGH (ref 0.0–0.2)

## 2020-11-26 LAB — GLUCOSE, CAPILLARY
Glucose-Capillary: 102 mg/dL — ABNORMAL HIGH (ref 70–99)
Glucose-Capillary: 103 mg/dL — ABNORMAL HIGH (ref 70–99)
Glucose-Capillary: 107 mg/dL — ABNORMAL HIGH (ref 70–99)
Glucose-Capillary: 113 mg/dL — ABNORMAL HIGH (ref 70–99)
Glucose-Capillary: 98 mg/dL (ref 70–99)
Glucose-Capillary: 98 mg/dL (ref 70–99)

## 2020-11-26 LAB — MAGNESIUM: Magnesium: 2.1 mg/dL (ref 1.7–2.4)

## 2020-11-26 MED ORDER — POTASSIUM CHLORIDE 10 MEQ/50ML IV SOLN
10.0000 meq | INTRAVENOUS | Status: AC
  Administered 2020-11-26 (×5): 10 meq via INTRAVENOUS
  Filled 2020-11-26 (×5): qty 50

## 2020-11-26 MED ORDER — PANTOPRAZOLE SODIUM 40 MG IV SOLR
40.0000 mg | Freq: Two times a day (BID) | INTRAVENOUS | Status: DC
  Administered 2020-11-26 – 2020-11-27 (×3): 40 mg via INTRAVENOUS
  Filled 2020-11-26 (×3): qty 40

## 2020-11-26 MED ORDER — CHLORHEXIDINE GLUCONATE 0.12 % MT SOLN
OROMUCOSAL | Status: AC
  Filled 2020-11-26: qty 15

## 2020-11-26 MED ORDER — POTASSIUM PHOSPHATES 15 MMOLE/5ML IV SOLN
10.0000 mmol | Freq: Once | INTRAVENOUS | Status: AC
  Administered 2020-11-26: 10 mmol via INTRAVENOUS
  Filled 2020-11-26: qty 3.33

## 2020-11-26 NOTE — Progress Notes (Signed)
NAME:  Kaylee King, MRN:  [id number], DOB:  09-Oct-1941, LOS: 3 ADMISSION DATE:  11/23/2020, CONSULTATION DATE:  11/23/20 REFERRING MD:  Bernette Mayers CHIEF COMPLAINT:  AMS   Brief History:  30 yoF with history of hypothyroidism noncompliant presenting with progressive headaches, fatigue, confusion, and hypotension of unclear etiology.  History of Present Illness:  24 yoF presented with progressive headaches, fatigue, confusion, and hypotension. Over the past 3 days prior to admission, patient started having a debilitating headache with associated fatigue and confusion. Brought in by EMS, noted to have blood around nares. Patient was unable to provide history, daughter was at bedside. In the ED, had coffee ground/maroon colored hematemesis. Started on levophed and intubated to protect her airway. GI was consulted and she was started on octreotide and PPI infusion.  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  Consults:  PCCM GI  Procedures:  Central venous cath 1/20  Significant Diagnostic Tests:  CT Head 1. No acute intracranial findings. 2. Periventricular white matter and corona radiata hypodensities favor chronic ischemic microvascular white matter disease.  CXR basilar atelectasis versus possible pneumonia (aspiration?)  EDG 1/20- Grade III esophageal varices, banded x6. Type I GE varices without bleeding. Nonbleeding gastric ulcers x3.   Micro Data:  covid negative   Antimicrobials:  Ceftriaxone 1/20 >>  Interim History / Subjective:   Continues to be agitated and requiring increasing doses of sedation.  Small melena like stools.  But no active emesis.   Precedex stopped due to bradycardia.  Increased oxygen requirement.  Objective   Blood pressure (!) 168/89, pulse 77, temperature 97.8 F (36.6 C), temperature source Axillary, resp. rate (!) 26, height 5\' 4"  (1.626 m), weight 82.1 kg, SpO2 96 %.    Vent Mode: PRVC FiO2 (%):  [50 %-60 %] 50 % Set Rate:  [26 bmp] 26 bmp Vt Set:  [430 mL] 430 mL PEEP:  [8 cmH20] 8 cmH20 Plateau Pressure:  [21 cmH20-23 cmH20] 23 cmH20   Intake/Output Summary (Last 24 hours) at 11/26/2020 1508 Last data filed at 11/26/2020 1500 Gross per 24 hour  Intake 3262.4 ml  Output 1050 ml  Net 2212.4 ml   Filed Weights   11/23/20 2155 11/24/20 0500 11/26/20 0357  Weight: 78.1 kg 78.1 kg 82.1 kg    Examination: General: ill appearing, pale, NAD Lungs: CTA bilaterally, on vent Cardiovascular: regular rhythm, bradycardic, no murmurs  Abdomen: BS+, soft, non-distended, nontender Extremities: warm and dry, no LE edema Neuro: Agitated moving around in bed, not following commands.   Resolved Hospital Problem list     Assessment & Plan:   Critically ill due to acute hypoxic respiratory failure requiring mechanical ventilation.  Mental status precludes extubation. -Full ventilator support. -Increase PEEP for decreased oxygenation  UGIB Hypotension Hx of NASH cirrhosis, portal vein thrombosis  Hypotensive and actively vomited blood on admission, concerning for hemorrhagic shock. Hgb 5.6, now improved to 8.8 s/p 2 units FFP and 4 units PRBC. Bedside EGD showed grade III esophageal varices, banded x6. On octreotide and PPI infusion, antibiotics for SBP prophylaxis, per GI. Will need to consult IR for consideration of BRTO vs TIPS. -Pressors have been weaned off - Continue ceftriaxone for 5 days, SBP prophylaxis - Transfuse prn,  hgb goal >7.0 -Stop octreotide, switch to twice daily Protonix  Acute encephalopathy  Likely due to hepatic encephalopathy in setting of GI bleed and cirrhosis. Intubated on arrival  due to inability to protect airway, wean from vent as tolerated.Will eventually need to begin lactulose.  - wean from vent support as tolerated  -We will try combination of lorazepam/haloperidol for agitation -Dexmedetomidine has been weaned off. -Consider restarting lactulose once enteral access established tomorrow  Hypothyroidism  Noncompliant with medications. TSH 51 on admission.  - Continue IV synthroid  -We will recheck free T4 tomorrow  AKI Unknown baseline, Cr 1.79 on admission. Improved to 1.59. - Trend bmp -Continue IV fluids.  Best practice (evaluated daily)  Diet: npo -Place small bore feeding tube on Monday Pain/Anxiety/Delirium protocol (if indicated): On fentanyl alone VAP protocol (if indicated): in place DVT prophylaxis: SCDs GI prophylaxis: PPI Glucose control: SSI Mobility: bedrest Disposition: ICU Family communication: Unable to reach daughter today  Goals of Care:  Code status- full code.  Labs   CBC: Recent Labs  Lab 11/24/20 0018 11/24/20 0459 11/24/20 2145 11/25/20 0339 11/25/20 1303 11/25/20 1751 11/26/20 0344  WBC 13.0*   < > 8.5 9.2 7.0 8.5 5.5  NEUTROABS 11.7*  --   --   --   --   --   --   HGB 5.6*   < > 8.4* 8.6* 8.0* 8.5* 7.9*  HCT 17.9*   < > 26.2* 27.3* 24.7* 27.5* 25.9*  MCV 88.6   < > 87.9 90.1 89.5 92.3 93.2  PLT 256   < > 199 204 177 206 169   < > = values in this interval not displayed.    Basic Metabolic Panel: Recent Labs  Lab 11/23/20 1100 11/23/20 1530 11/24/20 0459 11/25/20 0339 11/26/20 0344  NA 143  --  147* 148* 146*  K 4.2  --  3.5 3.1* 3.1*  CL 112*  --  108 109 111  CO2 18*  --  23 25 24   GLUCOSE 136*  --  127* 137* 115*  BUN 45*  --  45* 46* 36*  CREATININE 1.79*  --  1.59* 1.78* 1.61*  CALCIUM 8.1*  --  7.6* 7.1* 7.0*  MG  --  2.2 2.1 2.2 2.1  PHOS  --  3.3 3.4 3.2 2.0*   GFR: Estimated Creatinine Clearance: 28.4 mL/min (A) (by C-G formula based on SCr of 1.61 mg/dL (H)). Recent Labs  Lab  11/23/20 1423 11/24/20 0018 11/24/20 0130 11/24/20 0459 11/24/20 1755 11/24/20 2145 11/25/20 0339 11/25/20 1303 11/25/20 1751 11/26/20 0344  PROCALCITON  --  0.28  --   --   --   --   --   --   --   --   WBC  --  13.0*  --    < >  --    < > 9.2 7.0 8.5 5.5  LATICACIDVEN 5.5*  --  2.9*  --  3.0*  --   --   --   --   --    < > = values in this interval not displayed.    Liver Function Tests: Recent Labs  Lab 11/23/20 1100  AST 29  ALT 13  ALKPHOS 93  BILITOT 0.9  PROT 5.6*  ALBUMIN 2.3*   Recent Labs  Lab 11/23/20 1530  LIPASE 50   Recent Labs  Lab 11/25/20 1751  AMMONIA 29    ABG No results found for: PHART, PCO2ART, PO2ART, HCO3, TCO2, ACIDBASEDEF, O2SAT  Coagulation Profile: Recent Labs  Lab 11/24/20 0018  INR 1.3*    Cardiac Enzymes: Recent Labs  Lab 11/23/20 1530  CKTOTAL 74    HbA1C: No results found for: HGBA1C  CBG: Recent Labs  Lab 11/25/20 1957 11/25/20 2332 11/26/20 0318 11/26/20 0821 11/26/20 1230  GLUCAP 120* 118* 113* 102* 107*   CRITICAL CARE Performed by: Lynnell Catalan   Total critical care time: 40 minutes  Critical care time was exclusive of separately billable procedures and treating other patients.  Critical care was necessary to treat or prevent imminent or life-threatening deterioration.  Critical care was time spent personally by me on the following activities: development of treatment plan with patient and/or surrogate as well as nursing, discussions with consultants, evaluation of patient's response to treatment, examination of patient, obtaining history from patient or surrogate, ordering and performing treatments and interventions, ordering and review of laboratory studies, ordering and review of radiographic studies, pulse oximetry, re-evaluation of patient's condition and participation in multidisciplinary rounds.  Lynnell Catalan, MD [facility] ICU Physician [facility]  Pager:  [phone] Mobile: [phone] After hours: [phone].

## 2020-11-26 NOTE — Progress Notes (Addendum)
Daily Rounding Note  11/26/2020, 10:38 AM  LOS: 3 days   SUBJECTIVE:   Chief complaint: Esophageal variceal bleed.      Off pressors as of yesterday PM.  Remains intubated w intermittent agitation, on Fentanyl gtt, prn Ativan.    Patient unable to provide any history or review of systems since she is intubated with altered mental status  OBJECTIVE:         Vital signs in last 24 hours:    Temp:  [97.7 F (36.5 C)-99.2 F (37.3 C)] 98 F (36.7 C) (01/23 0800) Pulse Rate:  [49-102] 85 (01/23 1000) Resp:  [19-35] 29 (01/23 1000) BP: (105-164)/(57-118) 159/86 (01/23 1000) SpO2:  [93 %-100 %] 96 % (01/23 1000) FiO2 (%):  [50 %-60 %] 50 % (01/23 0757) Weight:  [82.1 kg] 82.1 kg (01/23 0357) Last BM Date: 11/25/20 Filed Weights   11/23/20 2155 11/24/20 0500 11/26/20 0357  Weight: 78.1 kg 78.1 kg 82.1 kg   General: intubated, resting quietly on vent   Heart: RRR Chest: no labored breathing on vent.  Good air entry bilaterally. Abdomen: soft, ND, obese.  NT to light/moderate pressure.  BS scant but of normal character.  Not distended or tympanitic Extremities: feet cool w/o cyanosis.  No LE edema Neuro/Psych:  Sedated, unresponsive to exam.  Made no vigorous effort to awaken/arouse pt.    Intake/Output from previous day: 01/22 0701 - 01/23 0700 In: 4023.3 [I.V.:3235.4; IV Piggyback:787.9] Out: 1050 [Urine:1050]  Intake/Output this shift: Total I/O In: 396.1 [I.V.:396.1] Out: -   Lab Results: Recent Labs    11/25/20 1303 11/25/20 1751 11/26/20 0344  WBC 7.0 8.5 5.5  HGB 8.0* 8.5* 7.9*  HCT 24.7* 27.5* 25.9*  PLT 177 206 169   BMET Recent Labs    11/24/20 0459 11/25/20 0339 11/26/20 0344  NA 147* 148* 146*  K 3.5 3.1* 3.1*  CL 108 109 111  CO2 '23 25 24  ' GLUCOSE 127* 137* 115*  BUN 45* 46* 36*  CREATININE 1.59* 1.78* 1.61*  CALCIUM 7.6* 7.1* 7.0*   LFT Recent Labs    11/23/20 1100  PROT 5.6*   ALBUMIN 2.3*  AST 29  ALT 13  ALKPHOS 93  BILITOT 0.9   PT/INR Recent Labs    11/24/20 0018  LABPROT 15.6*  INR 1.3*   Hepatitis Panel No results for input(s): HEPBSAG, HCVAB, HEPAIGM, HEPBIGM in the last 72 hours.  Studies/Results: DG CHEST PORT 1 VIEW  Result Date: 11/25/2020 CLINICAL DATA:  Respiratory failure, altered mental status EXAM: PORTABLE CHEST 1 VIEW COMPARISON:  11/24/2020 FINDINGS: Endotracheal tube terminates 4.5 cm above the carina. Left IJ venous catheter terminates in the distal left brachiocephalic vein, unchanged. Mild patchy left lower lobe opacity, suspicious for pneumonia. Suspected small bilateral pleural effusions. No pneumothorax. The heart is normal in size.  Thoracic aortic atherosclerosis. IMPRESSION: Endotracheal tube terminates 4.5 cm above the carina. Mild patchy left lower lobe opacity, suspicious for pneumonia. Suspected small bilateral pleural effusions. Electronically Signed   By: Julian Hy M.D.   On: 11/25/2020 11:53    Scheduled Meds: . chlorhexidine gluconate (MEDLINE KIT)  15 mL Mouth Rinse BID  . Chlorhexidine Gluconate Cloth  6 each Topical Daily  . docusate  100 mg Per Tube BID  . insulin aspart  0-24 Units Subcutaneous Q4H  . levothyroxine  50 mcg Intravenous Daily  . mouth rinse  15 mL Mouth Rinse 10 times per day  .  polyethylene glycol  17 g Per Tube Daily   Continuous Infusions: . sodium chloride    . cefTRIAXone (ROCEPHIN)  IV Stopped (11/25/20 1254)  . dexmedetomidine (PRECEDEX) IV infusion Stopped (11/25/20 0606)  . fentaNYL infusion INTRAVENOUS 200 mcg/hr (11/26/20 1000)  . norepinephrine (LEVOPHED) Adult infusion Stopped (11/26/20 0139)  . octreotide  (SANDOSTATIN)    IV infusion 50 mcg/hr (11/26/20 1000)  . pantoprozole (PROTONIX) infusion 8 mg/hr (11/26/20 1000)  . ringers 75 mL/hr at 11/26/20 1000   PRN Meds:.docusate sodium, fentaNYL (SUBLIMAZE) injection, fentaNYL (SUBLIMAZE) injection, haloperidol lactate,  LORazepam, polyethylene glycol  ASSESMENT:   *    NASH cirrhosis.  Hx esoph varices.   Not on beta-blockers, diuretics, PPI prior to admission.  *    Esophageal variceal bleed. 11/24/2019 EGD with large esophageal varices, stigmata of bleeding, EVL performed.  Smaller, not on bleeding gastric cardia varices without stigmata. Non-bleeding gastric varices, GOV1.  Multiple large, nonbleeding cratered gastric ulcers.  Esophageal varices felt to be source of hematemesis. However visualization of stomach limited by retained debris and blood. Gastric path pending. Serum H Pylori never ordered.   Rocephin d 3.  72 h Octreotide, Protonix drip in place, both finish today ~ 1730.    *   Acute blood loss anemia.  IDA as well w low ferritin.  Hgb 8.6 >> 8 >> 8.5 >> 7.9 in last 24 h.     *    Hx of portal vein thrombosis.  *   Agitation, delirium.  Ammonia level normal.     *   LLL patchiness, s/f PNA.  Rocephin day 3.    PLAN   *   Start Protonix 40 IV bid after gtt finishes.    *   Ordered serum H Pylori testing.    *   In regards to Rocephin, in setting of variceal bleed/cirrhosis needs 5 days of abx.  However CXR s/o PNA so may need > 5 d abx.      Azucena Freed  11/26/2020, 10:38 AM Phone [phone]  I have discussed the case with the PA, and that is the plan I formulated. I personally interviewed and examined the patient.  No recurrence of upper GI bleeding since endoscopic control late evening 11/23/2020. 72 hours of octreotide and pantoprazole will finish this evening.  Continue pantoprazole 40 mg IV twice daily after that.  Nonselective beta-blocker cannot be started since patient currently has a heart rate about 50.  Total 5 days of Rocephin for upper GI bleed in the setting of cirrhosis, unknown if patient has ascites.  Has not been stable enough to send for abdominal imaging.  If patient is felt to have pneumonia represented on the chest x-ray, then a longer course of  antibiotics may be needed.  We will leave this to the primary team to decide.  Serum H. pylori antibody ordered and result pending.  Treat if positive since patient had multiple large gastric ulcers.  Delirium with normal ammonia level.  She does not appear to need lactulose at this point. If she does not awaken and be closer to extubation by tomorrow, then she will need a core track tube for nutrition and medicines.  At that point it will be over 3 days from variceal band ligation, and I think the risk of a cautious placement of such a tube would be acceptable at that point.  We will continue to follow the patient, Dr. Henrene Pastor will manage the consult service tomorrow.  Nelida Meuse III Office: [phone]

## 2020-11-27 ENCOUNTER — Inpatient Hospital Stay (HOSPITAL_COMMUNITY): Payer: Medicare Other

## 2020-11-27 DIAGNOSIS — I8511 Secondary esophageal varices with bleeding: Secondary | ICD-10-CM | POA: Diagnosis not present

## 2020-11-27 DIAGNOSIS — L8990 Pressure ulcer of unspecified site, unspecified stage: Secondary | ICD-10-CM | POA: Diagnosis present

## 2020-11-27 DIAGNOSIS — D62 Acute posthemorrhagic anemia: Secondary | ICD-10-CM | POA: Diagnosis not present

## 2020-11-27 DIAGNOSIS — K259 Gastric ulcer, unspecified as acute or chronic, without hemorrhage or perforation: Secondary | ICD-10-CM

## 2020-11-27 DIAGNOSIS — G9341 Metabolic encephalopathy: Secondary | ICD-10-CM | POA: Diagnosis not present

## 2020-11-27 DIAGNOSIS — K766 Portal hypertension: Secondary | ICD-10-CM

## 2020-11-27 LAB — CBC
HCT: 27.4 % — ABNORMAL LOW (ref 36.0–46.0)
HCT: 24.4 % — ABNORMAL LOW (ref 36.0–46.0)
Hemoglobin: 8 g/dL — ABNORMAL LOW (ref 12.0–15.0)
Hemoglobin: 7.4 g/dL — ABNORMAL LOW (ref 12.0–15.0)
MCH: 27.9 pg (ref 26.0–34.0)
MCH: 28.5 pg (ref 26.0–34.0)
MCHC: 29.2 g/dL — ABNORMAL LOW (ref 30.0–36.0)
MCHC: 30.3 g/dL (ref 30.0–36.0)
MCV: 95.5 fL (ref 80.0–100.0)
MCV: 93.8 fL (ref 80.0–100.0)
Platelets: 196 K/uL (ref 150–400)
Platelets: 154 K/uL (ref 150–400)
RBC: 2.87 MIL/uL — ABNORMAL LOW (ref 3.87–5.11)
RBC: 2.6 MIL/uL — ABNORMAL LOW (ref 3.87–5.11)
RDW: 17.7 % — ABNORMAL HIGH (ref 11.5–15.5)
RDW: 17.7 % — ABNORMAL HIGH (ref 11.5–15.5)
WBC: 10.8 K/uL — ABNORMAL HIGH (ref 4.0–10.5)
WBC: 9.1 K/uL (ref 4.0–10.5)
nRBC: 1.3 % — ABNORMAL HIGH (ref 0.0–0.2)
nRBC: 1.3 % — ABNORMAL HIGH (ref 0.0–0.2)

## 2020-11-27 LAB — URINE CULTURE: Culture: >=100000 — AB

## 2020-11-27 LAB — COMPREHENSIVE METABOLIC PANEL WITH GFR
ALT: 16 U/L (ref 0–44)
AST: 40 U/L (ref 15–41)
Albumin: 2.1 g/dL — ABNORMAL LOW (ref 3.5–5.0)
Alkaline Phosphatase: 97 U/L (ref 38–126)
Anion gap: 10 (ref 5–15)
BUN: 24 mg/dL — ABNORMAL HIGH (ref 8–23)
CO2: 22 mmol/L (ref 22–32)
Calcium: 7.1 mg/dL — ABNORMAL LOW (ref 8.9–10.3)
Chloride: 111 mmol/L (ref 98–111)
Creatinine, Ser: 1.38 mg/dL — ABNORMAL HIGH (ref 0.44–1.00)
GFR, Estimated: 38 mL/min — ABNORMAL LOW (ref >60)
Glucose, Bld: 196 mg/dL — ABNORMAL HIGH (ref 70–99)
Potassium: 4.7 mmol/L (ref 3.5–5.1)
Sodium: 143 mmol/L (ref 135–145)
Total Bilirubin: 1 mg/dL (ref 0.3–1.2)
Total Protein: 5.4 g/dL — ABNORMAL LOW (ref 6.5–8.1)

## 2020-11-27 LAB — MAGNESIUM
Magnesium: 1.9 mg/dL (ref 1.7–2.4)
Magnesium: 2 mg/dL (ref 1.7–2.4)

## 2020-11-27 LAB — T4, FREE: Free T4: 0.5 ng/dL — ABNORMAL LOW (ref 0.61–1.12)

## 2020-11-27 LAB — BRAIN NATRIURETIC PEPTIDE: B Natriuretic Peptide: 455.7 pg/mL — ABNORMAL HIGH (ref 0.0–100.0)

## 2020-11-27 LAB — GLUCOSE, CAPILLARY
Glucose-Capillary: 110 mg/dL — ABNORMAL HIGH (ref 70–99)
Glucose-Capillary: 111 mg/dL — ABNORMAL HIGH (ref 70–99)
Glucose-Capillary: 86 mg/dL (ref 70–99)
Glucose-Capillary: 90 mg/dL (ref 70–99)
Glucose-Capillary: 95 mg/dL (ref 70–99)
Glucose-Capillary: 96 mg/dL (ref 70–99)

## 2020-11-27 LAB — PHOSPHORUS
Phosphorus: 5 mg/dL — ABNORMAL HIGH (ref 2.5–4.6)
Phosphorus: 2.1 mg/dL — ABNORMAL LOW (ref 2.5–4.6)

## 2020-11-27 MED ORDER — ALBUMIN HUMAN 25 % IV SOLN
25.0000 g | Freq: Four times a day (QID) | INTRAVENOUS | Status: AC
  Administered 2020-11-27 – 2020-11-28 (×4): 25 g via INTRAVENOUS
  Filled 2020-11-27 (×5): qty 100

## 2020-11-27 MED ORDER — LACTULOSE 10 GM/15ML PO SOLN
30.0000 g | Freq: Two times a day (BID) | ORAL | Status: DC
  Administered 2020-11-27 – 2020-11-29 (×4): 30 g
  Filled 2020-11-27 (×4): qty 45

## 2020-11-27 MED ORDER — VITAL HIGH PROTEIN PO LIQD: 1000.0000 mL | ORAL | Status: DC

## 2020-11-27 MED ORDER — METOLAZONE 5 MG PO TABS
5.0000 mg | ORAL_TABLET | Freq: Once | ORAL | Status: AC
  Administered 2020-11-27: 5 mg via ORAL
  Filled 2020-11-27: qty 1

## 2020-11-27 MED ORDER — VITAL HIGH PROTEIN PO LIQD
1000.0000 mL | ORAL | Status: AC
  Administered 2020-11-27: 1000 mL

## 2020-11-27 MED ORDER — VITAL HIGH PROTEIN PO LIQD
1000.0000 mL | ORAL | Status: DC
  Administered 2020-11-27: 1000 mL

## 2020-11-27 MED ORDER — DOCUSATE SODIUM 50 MG/5ML PO LIQD: 100.0000 mg | Freq: Two times a day (BID) | ORAL | Status: DC | PRN

## 2020-11-27 MED ORDER — PROSOURCE TF PO LIQD
45.0000 mL | Freq: Every day | ORAL | Status: DC
  Administered 2020-11-28 – 2020-12-13 (×14): 45 mL
  Filled 2020-11-27 (×15): qty 45

## 2020-11-27 MED ORDER — LACTULOSE 10 GM/15ML PO SOLN
30.0000 g | Freq: Two times a day (BID) | ORAL | Status: DC
  Filled 2020-11-27: qty 45

## 2020-11-27 MED ORDER — OSMOLITE 1.5 CAL PO LIQD
1000.0000 mL | ORAL | Status: DC
  Administered 2020-11-28 – 2020-12-08 (×6): 1000 mL
  Filled 2020-11-27: qty 1000

## 2020-11-27 MED ORDER — FUROSEMIDE 10 MG/ML IJ SOLN
40.0000 mg | Freq: Four times a day (QID) | INTRAMUSCULAR | Status: AC
  Administered 2020-11-27 (×2): 40 mg via INTRAVENOUS
  Filled 2020-11-27 (×2): qty 4

## 2020-11-27 MED ORDER — POLYETHYLENE GLYCOL 3350 17 G PO PACK: 17.0000 g | PACK | Freq: Every day | ORAL | Status: DC | PRN

## 2020-11-27 MED ORDER — PROSOURCE TF PO LIQD
45.0000 mL | Freq: Two times a day (BID) | ORAL | Status: DC
  Filled 2020-11-27: qty 45

## 2020-11-27 NOTE — Procedures (Signed)
Cortrak  Person Inserting Tube:  Kaylee King, RD Tube Type:  Cortrak - 43 inches Tube Location:  Right nare Initial Placement:  Stomach Secured by: Bridle Technique Used to Measure Tube Placement:  Documented cm marking at nare/ corner of mouth Cortrak Secured At:  64 cm    Cortrak Tube Team Note:  Consult received to place a Cortrak feeding tube.   No x-ray is required. RN may begin using tube.   If the tube becomes dislodged please keep the tube and contact the Cortrak team at [email or web address] (password [email or web address]) for replacement.  If after hours and replacement cannot be delayed, place a NG tube and confirm placement with an abdominal x-ray.   [name] MS, RDN, LDN, CNSC Registered Dietitian III Clinical Nutrition RD Pager and On-Call Pager Number Located in [place]

## 2020-11-27 NOTE — Progress Notes (Signed)
Failed wean this morning. Fentanyl gtt turned off and pt switched to wean on vent at 40% 8/8. Within 5 minutes she was breathing 45 times a minute then preceded to drop her O2 sats to 58%. MD at bedside. Flipped vent back to full support with good response and O2 saturation back up to 95%.

## 2020-11-27 NOTE — Progress Notes (Signed)
Nutrition Follow-up  DOCUMENTATION CODES:   Not applicable  INTERVENTION:   Initiate tube feeding via Cortrak tube:  Osmolite 1.5 at 60 ml/hr Pro-source TF 45 mL daily Provides 1768 kcals, 91 g of protein and 1166 mL of free water   NUTRITION DIAGNOSIS:   Inadequate oral intake related to acute illness as evidenced by NPO status. Ongoing.   GOAL:   Patient will meet greater than or equal to 90% of their needs Progressing.   MONITOR:   Vent status,Labs,Weight trends  REASON FOR ASSESSMENT:   Consult,Ventilator Enteral/tube feeding initiation and management  ASSESSMENT:   80 yo female admitted with coffee ground/maroon hematemesis with UGIB,shock, acute encephalopathy. PMH NASH cirrhosis, HTN   Pt discussed during ICU rounds and with RN.  Cortrak placed, starting TF. Per RN pt failed SBT this am.   1/20 Admitted, Intubated, EGD: 4 columns of large/grade 3 esophageal varicessix blands placed, clotted blood in gastric fundus-large clots removed 1/24 cortrak placed; tip gastric   Patient is currently intubated on ventilator support MV: 11.4 L/min Temp (24hrs), Avg:98 F (36.7 C), Min:97.8 F (36.6 C), Max:98.3 F (36.8 C)   Labs: sodium 147, PO4: 2.1 Meds: colace, lasix, ss novolog, lactulose, miralax   Diet Order:   Diet Order            Diet NPO time specified  Diet effective now                 EDUCATION NEEDS:   Not appropriate for education at this time  Skin:  Skin Assessment: Skin Integrity Issues: Skin Integrity Issues:: Stage II Stage II: sacrum  Last BM:  1/22  Height:   Ht Readings from Last 1 Encounters:  11/23/20 5\' 4"  (1.626 m)    Weight:   Wt Readings from Last 1 Encounters:  11/27/20 83.9 kg   BMI:  Body mass index is 31.75 kg/m.  Estimated Nutritional Needs:   Kcal:  1650-1850 kcals  Protein:  85-100 g  Fluid:  >/= 1.7 L   Coline Calkin P., RD, LDN, CNSC See AMiON for contact information

## 2020-11-27 NOTE — Progress Notes (Addendum)
Progress Note  Chief Complaint:    Variceal bleed     ASSESSMENT / PLAN:    # NASH cirrhosis with esophageal variceal bleed s/p banding on 11/23/20. Multiple chronic cratered ulcers as well. H.pylori serology pending. She received 4 units of blood and 2 of FFP and hgb has since remained stable ~ 8.  --Octreotide completed. Getting BID IV PPI --Remains intubated because of encephalopathy ( failed weaning trial today). --She did get a Cortrak placed. Getting Chronulac 30 grams BID  --Receiving tube feeds --Continue Rocephin for SBP prophylaxis. No imaging yet able to be done to check for ascites dut logistics of getting her to Radiology .   # Acute hypoxic respiratory failure requiring mechanical ventilation. She was early in admission for airway protection during GI bleed. Has been  unable to be extubated due to ongoing encephalopathy.       OBJECTIVE:   No GI acute issues per patient's RN   PREVIOUS ENDOSCOPIES / GI EVALUATION   11/25/20 EGD for GI bleed - Grade III esophageal varices. Most likely source of today's GI bleeding. Banded x 6. - Clotted blood in the gastric fundus, removed as above. - Type 1 gastroesophageal varices (GOV1, esophageal varices which extend along the lesser curvature), without bleeding. - Non-bleeding gastric ulcers x 3 with no stigmata of bleeding. - Normal examined duodenum. - No specimens collected. - Images not obtained via [facility], but will be added to [facility] for reference.  Scheduled inpatient medications:  . chlorhexidine gluconate (MEDLINE KIT)  15 mL Mouth Rinse BID  . Chlorhexidine Gluconate Cloth  6 each Topical Daily  . docusate  100 mg Per Tube BID  . feeding supplement (PROSource TF)  45 mL Per Tube BID  . feeding supplement (VITAL HIGH PROTEIN)  1,000 mL Per Tube Q24H  . furosemide  40 mg Intravenous Q6H  . insulin aspart  0-24 Units Subcutaneous Q4H  . lactulose  30 g Oral BID  . levothyroxine  50 mcg Intravenous Daily  .  mouth rinse  15 mL Mouth Rinse 10 times per day  . pantoprazole (PROTONIX) IV  40 mg Intravenous Q12H  . polyethylene glycol  17 g Per Tube Daily   Continuous inpatient infusions:  . sodium chloride    . cefTRIAXone (ROCEPHIN)  IV Stopped (11/27/20 1151)  . fentaNYL infusion INTRAVENOUS 50 mcg/hr (11/27/20 1400)   PRN inpatient medications: docusate sodium, fentaNYL (SUBLIMAZE) injection, fentaNYL (SUBLIMAZE) injection, haloperidol lactate, LORazepam, polyethylene glycol  Vital signs in last 24 hours: Temp:  [97.6 F (36.4 C)-98.3 F (36.8 C)] 98.3 F (36.8 C) (01/24 1200) Pulse Rate:  [56-94] 88 (01/24 1400) Resp:  [16-27] 26 (01/24 1400) BP: (83-168)/(48-105) 116/88 (01/24 1400) SpO2:  [93 %-100 %] 97 % (01/24 1400) FiO2 (%):  [40 %-50 %] 40 % (01/24 1400) Weight:  [83.9 kg] 83.9 kg (01/24 0500) Last BM Date: 11/25/20  Intake/Output Summary (Last 24 hours) at 11/27/2020 1423 Last data filed at 11/27/2020 1400 Gross per 24 hour  Intake 2384.69 ml  Output 2525 ml  Net -140.31 ml     Physical Exam:  . General: partly sedated on vent NAD . Heart:  Regular rate, bilateral pedal edema.  . Pulmonary: On ventilator. No wheezing in chest.  . Abdomen: Soft, nondistended, nontender. Normal bowel sounds.    Filed Weights   11/24/20 0500 11/26/20 0357 11/27/20 0500  Weight: 78.1 kg 82.1 kg 83.9 kg    Intake/Output from previous day: 01/23 0701 -  01/24 0700 In: 2982.6 [I.V.:2369.5; IV Piggyback:613.2] Out: 1610 [Urine:1650] Intake/Output this shift: Total I/O In: 346.5 [I.V.:133.2; NG/GT:113.3; IV Piggyback:100] Out: 960 [Urine:875]    Lab Results: Recent Labs    11/26/20 0344 11/26/20 1651 11/27/20 0523  WBC 5.5 5.8 10.8*  HGB 7.9* 8.0* 8.0*  HCT 25.9* 26.6* 27.4*  PLT 169 152 196   BMET Recent Labs    11/25/20 0339 11/26/20 0344 11/27/20 0523  NA 148* 146* 143  K 3.1* 3.1* 4.7  CL 109 111 111  CO2 '25 24 22  ' GLUCOSE 137* 115* 196*  BUN 46* 36* 24*   CREATININE 1.78* 1.61* 1.38*  CALCIUM 7.1* 7.0* 7.1*   LFT Recent Labs    11/27/20 0523  PROT 5.4*  ALBUMIN 2.1*  AST 40  ALT 16  ALKPHOS 97  BILITOT 1.0   PT/INR No results for input(s): LABPROT, INR in the last 72 hours. Hepatitis Panel No results for input(s): HEPBSAG, HCVAB, HEPAIGM, HEPBIGM in the last 72 hours.  DG Chest 1 View  Result Date: 11/27/2020 CLINICAL DATA:  Pneumonia.  Patient is intubated. EXAM: CHEST  1 VIEW COMPARISON:  One-view chest x-ray 11/25/2020 FINDINGS: Endotracheal tube is stable and in satisfactory position. Left IJ catheter is also stable and in satisfactory position. Heart is enlarged. Atherosclerotic calcifications are again noted at the arch. Bilateral pleural effusions and bibasilar airspace disease are similar the prior study. IMPRESSION: 1. Stable chest x-ray with bilateral pleural effusions and bibasilar airspace disease. 2. Stable support apparatus. Electronically Signed   By: San Morelle M.D.   On: 11/27/2020 08:56      Active Problems:   Acute metabolic encephalopathy     LOS: 4 days   Kaylee King ,NP 11/27/2020, 2:23 PM  GI ATTENDING  Interval history data reviewed. Patient seen and examined. Agree with interval progress note as outlined above. Stable from GI perspective. No new recommendations.  Kaylee King. Kaylee King., M.D. [facility] Division of Gastroenterology

## 2020-11-27 NOTE — Progress Notes (Signed)
NAME:  Kaylee King, MRN:  [id number], DOB:  09-25-1941, LOS: 4 ADMISSION DATE:  11/23/2020, CONSULTATION DATE:  11/23/20 REFERRING MD:  Bernette Mayers CHIEF COMPLAINT:  AMS   Brief History:  89 yoF with history of hypothyroidism noncompliant presenting with progressive headaches, fatigue, confusion, and hypotension of unclear etiology.  History of Present Illness:  33 yoF presented with progressive headaches, fatigue, confusion, and hypotension. Over the past 3 days prior to admission, patient started having a debilitating headache with associated fatigue and confusion. Brought in by EMS, noted to have blood around nares. Patient was unable to provide history, daughter was at bedside. In the ED, had coffee ground/maroon colored hematemesis. Started on levophed and intubated to protect her airway. GI was consulted and she was started on octreotide and PPI infusion.  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  Consults:  PCCM GI  Procedures:  Central venous cath 1/20  Significant Diagnostic Tests:  CT Head 1. No acute intracranial findings. 2. Periventricular white matter and corona radiata hypodensities favor chronic ischemic microvascular white matter disease.  CXR basilar atelectasis versus possible pneumonia (aspiration?)  EDG 1/20- Grade III esophageal varices, banded x6. Type I GE varices without bleeding. Nonbleeding gastric ulcers x3.   Micro Data:  covid negative   Antimicrobials:  Ceftriaxone 1/20 >>  Interim History / Subjective:  No events, agitated on vent but moving all 4 ext.  Objective   Blood pressure 139/84, pulse 72, temperature 97.8 F (36.6  C), temperature source Axillary, resp. rate (!) 23, height 5\' 4"  (1.626 m), weight 83.9 kg, SpO2 98 %.    Vent Mode: PRVC FiO2 (%):  [40 %-50 %] 40 % Set Rate:  [26 bmp] 26 bmp Vt Set:  [430 mL] 430 mL PEEP:  [8 cmH20] 8 cmH20 Plateau Pressure:  [22 cmH20-28 cmH20] 22 cmH20   Intake/Output Summary (Last 24 hours) at 11/27/2020 0710 Last data filed at 11/27/2020 0600 Gross per 24 hour  Intake 2897.22 ml  Output 1650 ml  Net 1247.22 ml   Filed Weights   11/24/20 0500 11/26/20 0357 11/27/20 0500  Weight: 78.1 kg 82.1 kg 83.9 kg    Examination: Constitutional: no acute distress  Eyes: EOMI, pupils small but reactive Ears, nose, mouth, and throat: ETT in place, minimal secretions Cardiovascular: RRR, ext warm Respiratory: mildly tachypneic due to agitation Gastrointestinal: Soft, hypoactive BS Skin: No rashes, normal turgor Neurologic: moves all 4 ext but not to command Psychiatric: RASS +1  Cr improved Sodium improved Hgb stable WBC slightly up No new imaging  Resolved Hospital Problem list     Assessment & Plan:   Acute hypoxic respiratory failure requiring mechanical ventilation.   -Full ventilator support. -Failed SBT this AM, desaturated to 50s with high RSBI -Check CXR, may need to try diuresis,+10L for admission -Fentanyl gtt titrated to RASS -1 to 0 -7 days ceftriaxone for SBP ppx and pneumonia tx  Variceal bleed, hemorrhagic shock- improved, post banding 11/23/20 - Finished   Acute encephalopathy  - OGT, start lactulose  Hypothyroidism  Noncompliant with medications. TSH 51 on admission.  - Continue IV synthroid, switch to PO once enteral access obtained  AKI- improved, oliguric, consider diuresis, check CXR/BNP  Best practice (evaluated daily)  Diet: npo -Place small bore feeding tube on Monday Pain/Anxiety/Delirium protocol (if indicated): On fentanyl alone VAP protocol (if indicated): in place DVT prophylaxis: SCDs given UGIB GI prophylaxis:  PPI Glucose control: SSI Mobility: bedrest Disposition: ICU Family communication: pending  Goals of Care:  Discussed on admission with daughter with RN present, full code, full scope of treatment, next due 1/27    Patient critically ill due to encephalopathy, respiratory failure Interventions to address this today sedation and vent titration Risk of deterioration without these interventions is high  I personally spent 39 minutes providing critical care not including any separately billable procedures  Myrla Halsted MD [place] [facility] 11/27/2020 8:15 AM Personal pager: [phone] If unanswered, please page CCM On-call: #[phone]

## 2020-11-28 DIAGNOSIS — K922 Gastrointestinal hemorrhage, unspecified: Secondary | ICD-10-CM | POA: Diagnosis not present

## 2020-11-28 DIAGNOSIS — J9601 Acute respiratory failure with hypoxia: Secondary | ICD-10-CM | POA: Diagnosis not present

## 2020-11-28 DIAGNOSIS — D62 Acute posthemorrhagic anemia: Secondary | ICD-10-CM | POA: Diagnosis not present

## 2020-11-28 DIAGNOSIS — I8511 Secondary esophageal varices with bleeding: Secondary | ICD-10-CM | POA: Diagnosis not present

## 2020-11-28 DIAGNOSIS — G9341 Metabolic encephalopathy: Secondary | ICD-10-CM | POA: Diagnosis not present

## 2020-11-28 LAB — BASIC METABOLIC PANEL
Anion gap: 13 (ref 5–15)
BUN: 29 mg/dL — ABNORMAL HIGH (ref 8–23)
CO2: 27 mmol/L (ref 22–32)
Calcium: 8.4 mg/dL — ABNORMAL LOW (ref 8.9–10.3)
Chloride: 107 mmol/L (ref 98–111)
Creatinine, Ser: 1.48 mg/dL — ABNORMAL HIGH (ref 0.44–1.00)
GFR, Estimated: 35 mL/min — ABNORMAL LOW (ref >60)
Glucose, Bld: 127 mg/dL — ABNORMAL HIGH (ref 70–99)
Potassium: 3.4 mmol/L — ABNORMAL LOW (ref 3.5–5.1)
Sodium: 147 mmol/L — ABNORMAL HIGH (ref 135–145)

## 2020-11-28 LAB — COMPREHENSIVE METABOLIC PANEL
ALT: 15 U/L (ref 0–44)
AST: 44 U/L — ABNORMAL HIGH (ref 15–41)
Albumin: 3 g/dL — ABNORMAL LOW (ref 3.5–5.0)
Alkaline Phosphatase: 81 U/L (ref 38–126)
Anion gap: 13 (ref 5–15)
BUN: 29 mg/dL — ABNORMAL HIGH (ref 8–23)
CO2: 27 mmol/L (ref 22–32)
Calcium: 8.4 mg/dL — ABNORMAL LOW (ref 8.9–10.3)
Chloride: 107 mmol/L (ref 98–111)
Creatinine, Ser: 1.53 mg/dL — ABNORMAL HIGH (ref 0.44–1.00)
GFR, Estimated: 34 mL/min — ABNORMAL LOW (ref >60)
Glucose, Bld: 126 mg/dL — ABNORMAL HIGH (ref 70–99)
Potassium: 2.8 mmol/L — ABNORMAL LOW (ref 3.5–5.1)
Sodium: 147 mmol/L — ABNORMAL HIGH (ref 135–145)
Total Bilirubin: 0.9 mg/dL (ref 0.3–1.2)
Total Protein: 6.1 g/dL — ABNORMAL LOW (ref 6.5–8.1)

## 2020-11-28 LAB — H PYLORI, IGM, IGG, IGA AB
H Pylori IgG: 1.45 Index Value — ABNORMAL HIGH (ref 0.00–0.79)
H. Pylogi, Iga Abs: 11.5 units — ABNORMAL HIGH (ref 0.0–8.9)
H. Pylogi, Igm Abs: <9 units (ref 0.0–8.9)

## 2020-11-28 LAB — CBC
HCT: 21.6 % — ABNORMAL LOW (ref 36.0–46.0)
HCT: 24.6 % — ABNORMAL LOW (ref 36.0–46.0)
Hemoglobin: 6.5 g/dL — CL (ref 12.0–15.0)
Hemoglobin: 7.6 g/dL — ABNORMAL LOW (ref 12.0–15.0)
MCH: 28.3 pg (ref 26.0–34.0)
MCH: 29 pg (ref 26.0–34.0)
MCHC: 30.1 g/dL (ref 30.0–36.0)
MCHC: 30.9 g/dL (ref 30.0–36.0)
MCV: 93.9 fL (ref 80.0–100.0)
MCV: 93.9 fL (ref 80.0–100.0)
Platelets: 128 10*3/uL — ABNORMAL LOW (ref 150–400)
Platelets: 133 10*3/uL — ABNORMAL LOW (ref 150–400)
RBC: 2.3 MIL/uL — ABNORMAL LOW (ref 3.87–5.11)
RBC: 2.62 MIL/uL — ABNORMAL LOW (ref 3.87–5.11)
RDW: 17.5 % — ABNORMAL HIGH (ref 11.5–15.5)
RDW: 16.8 % — ABNORMAL HIGH (ref 11.5–15.5)
WBC: 11.3 10*3/uL — ABNORMAL HIGH (ref 4.0–10.5)
WBC: 12 10*3/uL — ABNORMAL HIGH (ref 4.0–10.5)
nRBC: 0.7 % — ABNORMAL HIGH (ref 0.0–0.2)
nRBC: 0.4 % — ABNORMAL HIGH (ref 0.0–0.2)

## 2020-11-28 LAB — GLUCOSE, CAPILLARY
Glucose-Capillary: 106 mg/dL — ABNORMAL HIGH (ref 70–99)
Glucose-Capillary: 110 mg/dL — ABNORMAL HIGH (ref 70–99)
Glucose-Capillary: 116 mg/dL — ABNORMAL HIGH (ref 70–99)
Glucose-Capillary: 119 mg/dL — ABNORMAL HIGH (ref 70–99)
Glucose-Capillary: 129 mg/dL — ABNORMAL HIGH (ref 70–99)
Glucose-Capillary: 157 mg/dL — ABNORMAL HIGH (ref 70–99)

## 2020-11-28 LAB — PREPARE RBC (CROSSMATCH)

## 2020-11-28 LAB — PHOSPHORUS: Phosphorus: 2 mg/dL — ABNORMAL LOW (ref 2.5–4.6)

## 2020-11-28 LAB — PATHOLOGIST SMEAR REVIEW

## 2020-11-28 LAB — MAGNESIUM: Magnesium: 2 mg/dL (ref 1.7–2.4)

## 2020-11-28 MED ORDER — FENTANYL BOLUS VIA INFUSION
25.0000 ug | INTRAVENOUS | Status: DC | PRN
  Filled 2020-11-28: qty 25

## 2020-11-28 MED ORDER — POTASSIUM PHOSPHATES 15 MMOLE/5ML IV SOLN
15.0000 mmol | Freq: Once | INTRAVENOUS | Status: AC
  Administered 2020-11-28: 15 mmol via INTRAVENOUS
  Filled 2020-11-28: qty 5

## 2020-11-28 MED ORDER — DEXMEDETOMIDINE HCL IN NACL 400 MCG/100ML IV SOLN
0.4000 ug/kg/h | INTRAVENOUS | Status: DC
  Administered 2020-11-28: 0.4 ug/kg/h via INTRAVENOUS
  Administered 2020-11-29: 0.3 ug/kg/h via INTRAVENOUS
  Administered 2020-11-30: 0.2 ug/kg/h via INTRAVENOUS
  Filled 2020-11-28 (×3): qty 100

## 2020-11-28 MED ORDER — SODIUM CHLORIDE 0.9% IV SOLUTION: Freq: Once | INTRAVENOUS | Status: AC

## 2020-11-28 MED ORDER — FENTANYL CITRATE (PF) 100 MCG/2ML IJ SOLN
25.0000 ug | INTRAMUSCULAR | Status: DC | PRN
  Administered 2020-11-28 – 2020-11-29 (×4): 100 ug via INTRAVENOUS
  Filled 2020-11-28 (×4): qty 2

## 2020-11-28 MED ORDER — POTASSIUM CHLORIDE 20 MEQ PO PACK
60.0000 meq | PACK | Freq: Once | ORAL | Status: AC
  Administered 2020-11-28: 60 meq
  Filled 2020-11-28: qty 3

## 2020-11-28 MED ORDER — FREE WATER
200.0000 mL | Status: DC
  Administered 2020-11-28 – 2020-12-13 (×73): 200 mL

## 2020-11-28 MED ORDER — INFLUENZA VAC A&B SA ADJ QUAD 0.5 ML IM PRSY
0.5000 mL | PREFILLED_SYRINGE | INTRAMUSCULAR | Status: DC
  Filled 2020-11-28: qty 0.5

## 2020-11-28 MED ORDER — LEVOTHYROXINE SODIUM 100 MCG PO TABS
100.0000 ug | ORAL_TABLET | Freq: Every day | ORAL | Status: DC
  Administered 2020-11-28 – 2020-12-11 (×14): 100 ug
  Filled 2020-11-28 (×14): qty 1

## 2020-11-28 MED ORDER — PHENYLEPHRINE HCL-NACL 10-0.9 MG/250ML-% IV SOLN
0.0000 ug/min | INTRAVENOUS | Status: DC
  Administered 2020-11-29 (×2): 10 ug/min via INTRAVENOUS
  Administered 2020-11-30: 20 ug/min via INTRAVENOUS
  Filled 2020-11-28 (×3): qty 250

## 2020-11-28 MED ORDER — PANTOPRAZOLE SODIUM 40 MG PO PACK
40.0000 mg | PACK | Freq: Two times a day (BID) | ORAL | Status: DC
  Administered 2020-11-28 – 2020-12-13 (×29): 40 mg
  Filled 2020-11-28 (×31): qty 20

## 2020-11-28 MED ORDER — PHENYLEPHRINE HCL-NACL 10-0.9 MG/250ML-% IV SOLN
INTRAVENOUS | Status: AC
  Administered 2020-11-28: 20 ug/min via INTRAVENOUS
  Filled 2020-11-28: qty 250

## 2020-11-28 MED ORDER — PHENYLEPHRINE 40 MCG/ML (10ML) SYRINGE FOR IV PUSH (FOR BLOOD PRESSURE SUPPORT)
PREFILLED_SYRINGE | INTRAVENOUS | Status: AC
  Filled 2020-11-28: qty 10

## 2020-11-28 MED ORDER — SODIUM CHLORIDE 0.9 % IV BOLUS
1000.0000 mL | Freq: Once | INTRAVENOUS | Status: AC
  Administered 2020-11-28: 1000 mL via INTRAVENOUS

## 2020-11-28 NOTE — Progress Notes (Signed)
At 1230, this nurse noted patient's BP 62/21 (33) approximately 1 hour after starting precedex. Stopped precedex, placed patient in trendelenburg position and began sternal rubs. Paged Dr. Vassie Loll and Brett Canales Minor with no immediate return call.  Slowly, pt's BP began to improve before decreasing again. Paged Dr. Vassie Loll again who returned call and provided VO to run the previously ordered blood in without delay, 1L bolus NS at 943mL/hr, and a neo gtt should BP not improve following bolus. This nurse administered blood and bolus of NS. When bolus was complete, neo was started due to BP 94/44 (61). BP immediately increased to 170/76 (105) and neo stopped.  Will continue to monitor.

## 2020-11-28 NOTE — Progress Notes (Addendum)
NAME:  Kaylee King, MRN:  [id number], DOB:  1941-08-08, LOS: 5 ADMISSION DATE:  11/23/2020, CONSULTATION DATE:  11/23/20 REFERRING MD:  Bernette Mayers CHIEF COMPLAINT:  AMS   Brief History:  8 yoF with history of hypothyroidism noncompliant presenting with progressive headaches, fatigue, confusion, and hypotension of unclear etiology.  History of Present Illness:  87 yoF presented with progressive headaches, fatigue, confusion, and hypotension. Over the past 3 days prior to admission, patient started having a debilitating headache with associated fatigue and confusion. Brought in by EMS, noted to have blood around nares. Patient was unable to provide history, daughter was at bedside. In the ED, had coffee ground/maroon colored hematemesis. Started on levophed and intubated to protect her airway. GI was consulted and she was started on octreotide and PPI infusion.  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 November 28, 2020 transfused 1 unit of packed red blood cells  Consults:  PCCM GI  Procedures:  Central venous cath 1/20  Significant Diagnostic Tests:  CT Head 1. No acute intracranial findings. 2. Periventricular white matter and corona radiata hypodensities favor chronic ischemic microvascular white matter disease.  CXR basilar atelectasis versus possible pneumonia (aspiration?)  EDG 1/20- Grade III esophageal varices, banded x6. Type I GE varices without bleeding. Nonbleeding gastric ulcers x3.   Micro Data:  covid negative   Antimicrobials:  Ceftriaxone 1/20 >>  Interim History / Subjective:  Agitated  Objective   Blood pressure 132/76, pulse 68,  temperature 98.1 F (36.7 C), temperature source Axillary, resp. rate (!) 26, height 5\' 4"  (1.626 m), weight 83.9 kg, SpO2 98 %.    Vent Mode: PRVC FiO2 (%):  [40 %] 40 % Set Rate:  [26 bmp] 26 bmp Vt Set:  [430 mL] 430 mL PEEP:  [8 cmH20] 8 cmH20 Plateau Pressure:  [15 cmH20-22 cmH20] 22 cmH20   Intake/Output Summary (Last 24 hours) at 11/28/2020 0813 Last data filed at 11/28/2020 0700 Gross per 24 hour  Intake 1289.34 ml  Output 3425 ml  Net -2135.66 ml   Filed Weights   11/24/20 0500 11/26/20 0357 11/27/20 0500  Weight: 78.1 kg 82.1 kg 83.9 kg    Examination: General: Elderly female who is agitated and all over the bed. HEENT: Endotracheal tube gastric tube in place Neuro: Does not follow commands. Agitated CV: Heart sounds are distant PULM: Diminished breath sounds in the base Vent settings pressure regulated volume control FIO2 40% PEEP five RATE 26 VT 430  GI: soft, bsx4 active dark stools but no frank blood GU: Amber urine Extremities: warm/dry, 1+ edema  Skin: no rashes or lesions   Resolved Hospital Problem list     Assessment & Plan:   Acute hypoxic respiratory failure requiring mechanical ventilation.    Remains on full ventilatory support Continue spontaneous breathing trials Status post diuresis of 2 L this may help with weaning from ventilator Complete 7 days of ceftriaxone for questionable pneumonia Fentanyl as needed for tube tolerance Consider low-dose Precedex as an adjunct for weaning sedation      Variceal bleed, hemorrhagic shock- improved, post banding 11/23/20 Recent Labs    11/27/20 1507 11/28/20 0530  HGB 7.4* 6.5*    Continue to monitor hemoglobin Transfuse per protocol  Acute encephalopathy  OG tube has been placed On lactulose  Last  ammonia level was 29 on November 25, 2020 Repeat ammonia level November 30, 1998 2010  Hypothyroidism  Noncompliant with medications. TSH 51 on admission.   Transition to p.o.  Synthroid TSH in the future  AKI- Lab Results  Component Value Date   CREATININE 1.53 (H) 11/28/2020   CREATININE 1.38 (H) 11/27/2020   CREATININE 1.61 (H) 11/26/2020   CREATININE 0.98 01/01/2012   Recent Labs  Lab 11/26/20 0344 11/27/20 0523 11/28/20 0530  K 3.1* 4.7 2.8*    Intake/Output Summary (Last 24 hours) at 11/28/2020 0816 Last data filed at 11/28/2020 0700 Gross per 24 hour  Intake 1289.34 ml  Output 3425 ml  Net -2135.66 ml   Status post diuresis on November 27, 2020 with a -2 L Note rise in creatinine from 1.38-1.53 Hold further diuresis for now Replete electrolytes as needed    Best practice (evaluated daily)  Diet: npo -Place small bore feeding tube on Monday Pain/Anxiety/Delirium protocol (if indicated): On fentanyl alone VAP protocol (if indicated): in place DVT prophylaxis: SCDs given UGIB GI prophylaxis: PPI Glucose control: SSI Mobility: bedrest Disposition: ICU Family communication: November 28, 2020 Daughter updated.   Goals of Care:  Discussed on admission with daughter with RN present, full code, full scope of treatment, next due 1/27    App cct 30 min  Brett Canales Aylyn Wenzler ACNP Acute Care Nurse Practitioner [facility] Please consult Amion 11/28/2020, 8:14 AM

## 2020-11-28 NOTE — Progress Notes (Signed)
eLink Physician-Brief Progress Note Patient Name: Kaylee King DOB: 1941-04-01 MRN: [id number]   Date of Service  11/28/2020  HPI/Events of Note  Anemia - Hgb = 6.5.   eICU Interventions  Will transfuse 1 unit PRBC now.      Intervention Category Major Interventions: Other:  Lenell Antu 11/28/2020, 6:26 AM

## 2020-11-28 NOTE — Progress Notes (Signed)
CRITICAL VALUE ALERT  Critical Value:  Hgb 6.5   Date & Time Notied:  11/28/2020 6:07 AM  Provider Notified: Pola Corn Dr Arsenio Loader  Orders Received/Actions taken: awaiting orders

## 2020-11-28 NOTE — Progress Notes (Signed)
Pt becoming increasingly more bradycardic into the low 40s. Precedex gtt decreased without change. Paused medication and stimulated patient and HR increased into the 50-60 range. Called Elink for possible changes to medications due to reoccurring bradycardia.  EKG completed showing sinus bradycardia and 1st degree AV block w/ PACs.   Awaiting any changes.

## 2020-11-28 NOTE — Progress Notes (Addendum)
Progress Note  Chief Complaint:  Variceal bleed, encephalopathy       ASSESSMENT / PLAN:    # NASH cirrhosis with esophageal variceal bleed s/p banding on 11/23/20. Multiple chronic cratered ulcers as well. H.pylori serology still pending. She received 4 units of blood and 2 of FFP with improvement in hgb but no hgb drifted from 8 yesterday morning to 6.5 this am. RN reporting a black loose stool after Lactulose this am.   --Hopefully she passed old blood after lactulose this am though decline in hgb is concerning. RN will notify us if stool doesn't start to return to normal color.  --Another unit of blood has been ordered -- Octreotide completed. Getting BID IV PPI --Remains intubated because of encephalopathy --She has a Cortrak. TF at 60 cc / hr. Getting Chronulac 30 grams BID  --On # 6 day of Rocephin for SBP prophylaxis.  No abdominal imaging has yet been done to check for ascites due to logistics of getting her to Radiology . Would continue Rocephin until sure that patient is not rebleeding  # Acute hypoxic respiratory failure requiring mechanical ventilation. She was early in admission for airway protection during GI bleed. Has been  unable to be extubated due to ongoing encephalopathy.       SUBJECTIVE:       OBJECTIVE:     Scheduled inpatient medications:  . sodium chloride   Intravenous Once  . chlorhexidine gluconate (MEDLINE KIT)  15 mL Mouth Rinse BID  . Chlorhexidine Gluconate Cloth  6 each Topical Daily  . docusate  100 mg Per Tube BID  . feeding supplement (PROSource TF)  45 mL Per Tube Daily  . insulin aspart  0-24 Units Subcutaneous Q4H  . lactulose  30 g Per Tube BID  . levothyroxine  100 mcg Per Tube Q0600  . mouth rinse  15 mL Mouth Rinse 10 times per day  . pantoprazole sodium  40 mg Per Tube BID  . polyethylene glycol  17 g Per Tube Daily   Continuous inpatient infusions:  . sodium chloride    . albumin human Stopped (11/28/20 0728)  .  cefTRIAXone (ROCEPHIN)  IV Stopped (11/27/20 1151)  . feeding supplement (OSMOLITE 1.5 CAL)    . fentaNYL infusion INTRAVENOUS 50 mcg/hr (11/28/20 0900)  . potassium PHOSPHATE IVPB (in mmol)     PRN inpatient medications: docusate, fentaNYL, haloperidol lactate, LORazepam, polyethylene glycol  Vital signs in last 24 hours: Temp:  [98.1 F (36.7 C)-98.5 F (36.9 C)] 98.1 F (36.7 C) (01/25 0400) Pulse Rate:  [59-100] 85 (01/25 0900) Resp:  [0-28] 0 (01/25 0900) BP: (83-161)/(39-98) 114/53 (01/25 0900) SpO2:  [94 %-100 %] 95 % (01/25 0900) FiO2 (%):  [40 %] 40 % (01/25 0811) Last BM Date: 11/28/20  Intake/Output Summary (Last 24 hours) at 11/28/2020 1023 Last data filed at 11/28/2020 0900 Gross per 24 hour  Intake 1375.59 ml  Output 3250 ml  Net -1874.41 ml     Physical Exam:  . General: Partially sedate female in NAD. Doesn't follow commands . Heart:  Regular rate, no lower extremity edema . Pulmonary:  intubated on ventilator . Abdomen: Soft, nondistended, doesn't appear tender, a few bowel sounds.     Filed Weights   11/24/20 0500 11/26/20 0357 11/27/20 0500  Weight: 78.1 kg 82.1 kg 83.9 kg    Intake/Output from previous day: 01/24 0701 - 01/25 0700 In: 1377 [I.V.:225.6; NG/GT:793.3; IV Piggyback:358.1] Out: 3007 [Urine:3425] Intake/Output this shift: Total  I/O In: 109.3 [I.V.:10; NG/GT:80; IV Piggyback:19.3] Out: -     Lab Results: Recent Labs    11/27/20 0523 11/27/20 1507 11/28/20 0530  WBC 10.8* 9.1 11.3*  HGB 8.0* 7.4* 6.5*  HCT 27.4* 24.4* 21.6*  PLT 196 154 128*   BMET Recent Labs    11/26/20 0344 11/27/20 0523 11/28/20 0530  NA 146* 143 147*  K 3.1* 4.7 2.8*  CL 111 111 107  CO2 '24 22 27  ' GLUCOSE 115* 196* 126*  BUN 36* 24* 29*  CREATININE 1.61* 1.38* 1.53*  CALCIUM 7.0* 7.1* 8.4*   LFT Recent Labs    11/28/20 0530  PROT 6.1*  ALBUMIN 3.0*  AST 44*  ALT 15  ALKPHOS 81  BILITOT 0.9   PT/INR No results for input(s):  LABPROT, INR in the last 72 hours. Hepatitis Panel No results for input(s): HEPBSAG, HCVAB, HEPAIGM, HEPBIGM in the last 72 hours.  DG Chest 1 View  Result Date: 11/27/2020 CLINICAL DATA:  Pneumonia.  Patient is intubated. EXAM: CHEST  1 VIEW COMPARISON:  One-view chest x-ray 11/25/2020 FINDINGS: Endotracheal tube is stable and in satisfactory position. Left IJ catheter is also stable and in satisfactory position. Heart is enlarged. Atherosclerotic calcifications are again noted at the arch. Bilateral pleural effusions and bibasilar airspace disease are similar the prior study. IMPRESSION: 1. Stable chest x-ray with bilateral pleural effusions and bibasilar airspace disease. 2. Stable support apparatus. Electronically Signed   By: San Morelle M.D.   On: 11/27/2020 08:56      Active Problems:   Acute metabolic encephalopathy   Pressure injury of skin     LOS: 5 days   [name] ,NP 11/28/2020, 10:23 AM  GI ATTENDING  Interval history data reviewed.  Agree with interval progress note as outlined above.  Drifting hemoglobin noted.  Now off octreotide.  Continue to monitor closely.  Transfuse for hemoglobin less than 7.  Will follow.  [name]. Geri Seminole., M.D. [facility] Division of Gastroenterology

## 2020-11-28 NOTE — Progress Notes (Signed)
Pharmacy Electrolyte Replacement  Recent Labs:  Recent Labs    11/28/20 0530  K 2.8*  MG 2.0  PHOS 2.0*  CREATININE 1.53*    Low Critical Values (K </= 2.5, Phos </= 1, Mg </= 1) Present: None  MD Contacted: n/a - no critical values noted  Plan: KPhos IV x 1 Already given Kcl PT x 1 per Elink overnight F/u AM labs   Leia Alf, PharmD, BCPS Please check AMION for all [facility] contact numbers Clinical Pharmacist 11/28/2020 8:29 AM

## 2020-11-28 NOTE — Progress Notes (Signed)
eLink Physician-Brief Progress Note Patient Name: Kaylee King DOB: 11/04/1941 MRN: [id number]   Date of Service  11/28/2020  HPI/Events of Note  Notified of bradycardia in the 40s on Precedex 0.2. BP 159/66 on neo. Request for resumption of Fentanyl drip.  eICU Interventions  Discussed with bedside RN to try Fentanyl pushes as ordered. If still not adequately sedated then will consider fentanyl infusion.     Intervention Category Intermediate Interventions: Arrhythmia - evaluation and management  Darl Pikes 11/28/2020, 8:50 PM

## 2020-11-28 NOTE — Progress Notes (Signed)
eLink Physician-Brief Progress Note Patient Name: Kaylee King DOB: February 26, 1941 MRN: [id number]   Date of Service  11/28/2020  HPI/Events of Note  Hypokalemia - K+ = 2.8 and Creatinine = 1.56.   eICU Interventions  Will replace K+.      Intervention Category Major Interventions: Electrolyte abnormality - evaluation and management  [name] 11/28/2020, 6:55 AM

## 2020-11-29 ENCOUNTER — Inpatient Hospital Stay (HOSPITAL_COMMUNITY): Payer: Medicare Other

## 2020-11-29 DIAGNOSIS — I8501 Esophageal varices with bleeding: Secondary | ICD-10-CM | POA: Diagnosis not present

## 2020-11-29 DIAGNOSIS — G9341 Metabolic encephalopathy: Secondary | ICD-10-CM | POA: Diagnosis not present

## 2020-11-29 DIAGNOSIS — J9690 Respiratory failure, unspecified, unspecified whether with hypoxia or hypercapnia: Secondary | ICD-10-CM | POA: Diagnosis present

## 2020-11-29 DIAGNOSIS — K7581 Nonalcoholic steatohepatitis (NASH): Secondary | ICD-10-CM | POA: Diagnosis not present

## 2020-11-29 DIAGNOSIS — J9601 Acute respiratory failure with hypoxia: Secondary | ICD-10-CM | POA: Diagnosis not present

## 2020-11-29 DIAGNOSIS — K259 Gastric ulcer, unspecified as acute or chronic, without hemorrhage or perforation: Secondary | ICD-10-CM | POA: Diagnosis not present

## 2020-11-29 LAB — BPAM RBC
Blood Product Expiration Date: 202202222359
ISSUE DATE / TIME: 202201251315
Unit Type and Rh: 5100

## 2020-11-29 LAB — CBC
HCT: 23.3 % — ABNORMAL LOW (ref 36.0–46.0)
HCT: 23.2 % — ABNORMAL LOW (ref 36.0–46.0)
Hemoglobin: 7.3 g/dL — ABNORMAL LOW (ref 12.0–15.0)
Hemoglobin: 7.3 g/dL — ABNORMAL LOW (ref 12.0–15.0)
MCH: 29 pg (ref 26.0–34.0)
MCH: 29.1 pg (ref 26.0–34.0)
MCHC: 31.3 g/dL (ref 30.0–36.0)
MCHC: 31.5 g/dL (ref 30.0–36.0)
MCV: 92.5 fL (ref 80.0–100.0)
MCV: 92.4 fL (ref 80.0–100.0)
Platelets: 111 10*3/uL — ABNORMAL LOW (ref 150–400)
Platelets: 117 10*3/uL — ABNORMAL LOW (ref 150–400)
RBC: 2.52 MIL/uL — ABNORMAL LOW (ref 3.87–5.11)
RBC: 2.51 MIL/uL — ABNORMAL LOW (ref 3.87–5.11)
RDW: 17.2 % — ABNORMAL HIGH (ref 11.5–15.5)
RDW: 17.3 % — ABNORMAL HIGH (ref 11.5–15.5)
WBC: 10.8 10*3/uL — ABNORMAL HIGH (ref 4.0–10.5)
WBC: 11 10*3/uL — ABNORMAL HIGH (ref 4.0–10.5)
nRBC: 0.9 % — ABNORMAL HIGH (ref 0.0–0.2)
nRBC: 1.2 % — ABNORMAL HIGH (ref 0.0–0.2)

## 2020-11-29 LAB — COMPREHENSIVE METABOLIC PANEL
ALT: 19 U/L (ref 0–44)
AST: 57 U/L — ABNORMAL HIGH (ref 15–41)
Albumin: 2.9 g/dL — ABNORMAL LOW (ref 3.5–5.0)
Alkaline Phosphatase: 86 U/L (ref 38–126)
Anion gap: 11 (ref 5–15)
BUN: 23 mg/dL (ref 8–23)
CO2: 27 mmol/L (ref 22–32)
Calcium: 8.1 mg/dL — ABNORMAL LOW (ref 8.9–10.3)
Chloride: 107 mmol/L (ref 98–111)
Creatinine, Ser: 1.09 mg/dL — ABNORMAL HIGH (ref 0.44–1.00)
GFR, Estimated: 51 mL/min — ABNORMAL LOW (ref >60)
Glucose, Bld: 151 mg/dL — ABNORMAL HIGH (ref 70–99)
Potassium: 3.1 mmol/L — ABNORMAL LOW (ref 3.5–5.1)
Sodium: 145 mmol/L (ref 135–145)
Total Bilirubin: 0.9 mg/dL (ref 0.3–1.2)
Total Protein: 5.4 g/dL — ABNORMAL LOW (ref 6.5–8.1)

## 2020-11-29 LAB — TYPE AND SCREEN
ABO/RH(D): O POS
Antibody Screen: NEGATIVE
Unit division: 0

## 2020-11-29 LAB — GLUCOSE, CAPILLARY
Glucose-Capillary: 120 mg/dL — ABNORMAL HIGH (ref 70–99)
Glucose-Capillary: 128 mg/dL — ABNORMAL HIGH (ref 70–99)
Glucose-Capillary: 130 mg/dL — ABNORMAL HIGH (ref 70–99)
Glucose-Capillary: 138 mg/dL — ABNORMAL HIGH (ref 70–99)
Glucose-Capillary: 138 mg/dL — ABNORMAL HIGH (ref 70–99)
Glucose-Capillary: 141 mg/dL — ABNORMAL HIGH (ref 70–99)

## 2020-11-29 LAB — AMMONIA: Ammonia: 28 umol/L (ref 9–35)

## 2020-11-29 LAB — MAGNESIUM: Magnesium: 1.9 mg/dL (ref 1.7–2.4)

## 2020-11-29 LAB — PHOSPHORUS: Phosphorus: 2.1 mg/dL — ABNORMAL LOW (ref 2.5–4.6)

## 2020-11-29 MED ORDER — POTASSIUM PHOSPHATES 15 MMOLE/5ML IV SOLN
15.0000 mmol | Freq: Once | INTRAVENOUS | Status: AC
  Administered 2020-11-29: 15 mmol via INTRAVENOUS
  Filled 2020-11-29: qty 5

## 2020-11-29 MED ORDER — POTASSIUM CHLORIDE 20 MEQ PO PACK
20.0000 meq | PACK | Freq: Once | ORAL | Status: AC
  Administered 2020-11-29: 20 meq
  Filled 2020-11-29: qty 1

## 2020-11-29 MED ORDER — FUROSEMIDE 10 MG/ML IJ SOLN
20.0000 mg | Freq: Once | INTRAMUSCULAR | Status: AC
  Administered 2020-11-29: 20 mg via INTRAVENOUS
  Filled 2020-11-29: qty 2

## 2020-11-29 MED ORDER — SODIUM CHLORIDE 0.9 % IV SOLN: INTRAVENOUS | Status: DC | PRN

## 2020-11-29 MED ORDER — LACTULOSE 10 GM/15ML PO SOLN
10.0000 g | Freq: Every day | ORAL | Status: DC
  Administered 2020-11-30: 10 g
  Filled 2020-11-29: qty 15

## 2020-11-29 MED ORDER — POTASSIUM CHLORIDE 10 MEQ/50ML IV SOLN
10.0000 meq | INTRAVENOUS | Status: AC
  Administered 2020-11-29 (×2): 10 meq via INTRAVENOUS
  Filled 2020-11-29 (×2): qty 50

## 2020-11-29 NOTE — Progress Notes (Signed)
Pharmacy Electrolyte Replacement  Recent Labs:  Recent Labs    11/29/20 0613  K 3.1*  MG 1.9  PHOS 2.1*  CREATININE 1.09*    Low Critical Values (K </= 2.5, Phos </= 1, Mg </= 1) Present: None  MD Contacted: n/a - no critical values noted  Plan: KPhos IV x 1 KCl PT x 1 + K runs x 2 Recheck labs in AM per protocol   Leia Alf, PharmD, BCPS Please check AMION for all [facility] contact numbers Clinical Pharmacist 11/29/2020 9:05 AM

## 2020-11-29 NOTE — Progress Notes (Signed)
NAME:  Kaylee King, MRN:  [id number], DOB:  November 23, 1940, LOS: 6 ADMISSION DATE:  11/23/2020, CONSULTATION DATE:  11/23/20 REFERRING MD:  Bernette Mayers CHIEF COMPLAINT:  AMS   Brief History:  37 yoF with history of hypothyroidism noncompliant presenting with progressive headaches, fatigue, confusion, and hypotension of unclear etiology.  History of Present Illness:  48 yoF presented with progressive headaches, fatigue, confusion, and hypotension. Over the past 3 days prior to admission, patient started having a debilitating headache with associated fatigue and confusion. Brought in by EMS, noted to have blood around nares. Patient was unable to provide history, daughter was at bedside. In the ED, had coffee ground/maroon colored hematemesis. Started on levophed and intubated to protect her airway. GI was consulted and she was started on octreotide and PPI infusion.  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 November 28, 2020 transfused 1 unit of packed red blood cells  Consults:  PCCM GI  Procedures:  Central venous cath 1/20  Significant Diagnostic Tests:  CT Head 1. No acute intracranial findings. 2. Periventricular white matter and corona radiata hypodensities favor chronic ischemic microvascular white matter disease.  CXR basilar atelectasis versus possible pneumonia (aspiration?)  EDG 1/20- Grade III esophageal varices, banded x6. Type I GE varices without bleeding. Nonbleeding gastric ulcers x3.   Micro Data:  covid negative   Antimicrobials:  Ceftriaxone 1/20 >>  Interim History / Subjective:    Bradycardia down to 40s on precedex drip, given fentanyl  pushes. Placed on neosyneprhine for hypotension.  Patient intubated and sedated, not following commands.   Objective   Blood pressure (!) 100/53, pulse (!) 50, temperature 97.6 F (36.4 C), temperature source Axillary, resp. rate 18, height 5\' 4"  (1.626 m), weight 84 kg, SpO2 98 %.    Vent Mode: PSV;CPAP FiO2 (%):  [30 %-60 %] 40 % Set Rate:  [2 bmp-20 bmp] 20 bmp Vt Set:  [430 mL] 430 mL PEEP:  [5 cmH20] 5 cmH20 Pressure Support:  [12 cmH20-15 cmH20] 12 cmH20 Plateau Pressure:  [15 cmH20-21 cmH20] 15 cmH20   Intake/Output Summary (Last 24 hours) at 11/29/2020 0757 Last data filed at 11/29/2020 0700 Gross per 24 hour  Intake 4652.37 ml  Output 2950 ml  Net 1702.37 ml   Filed Weights   11/26/20 0357 11/27/20 0500 11/29/20 0500  Weight: 82.1 kg 83.9 kg 84 kg    Examination: General: Elderly female, intermittent agitation, sedated HEENT: ETT in place, NGT in place, LIJ in place Cardiac: RRR, no m/r/g Pulmonary: CTABL, no wheezing, rhonchi, rales Abdomen: Soft, non-tender, abdominal distension, dark stools in flexiseal Extremity: Trace BL LE swelling Neuro: Sedated, not following commands  Currently on pressure support, tidal volumes around 600, respiration rate 17, PEEP 5  Labs and imaging reviewed: Sodium 145, potassium 3.1, bicarb 27, creatinine 1.09(down from 1.48 yesterday).  Phos 2.1, magnesium 1.9 ammonia 28.  Hemoglobin 7.3, WBC 10.8, PLT 111.  Chest x-ray showed cardiomegaly, bilateral opacities similar to prior, ET tube in place, NG tube in place.  Resolved Hospital Problem list     Assessment & Plan:   Acute hypoxic respiratory failure requiring mechanical ventilation: Intubated for airway protection, acute encephalopathy likely 2/2 hepatic vs myxedema coma. Patient is currently on for  ventilatory support, tolerating SBT this morning. Currently on ceftriaxone for possible pneumonia. CXR this morning showed stable BL opacities.  Not following commands on exam,  nursing reports patient intermittently followed commands. Had episode of hypotension overnight while on precedex drip, placed on neosyneprhine.  -Full ventilator support -Daily SBT -Wean down on precedex drip, continue fentanyl pushes -Mental status precluding extubation -Continue ceftriaxone for now, day 6  Variceal bleed, hemorrhagic shock- improved, status post banding 11/23/20. Developed hypotension after being on precedex drip which required neosynephrine drip, does not appear to be recurrence of hemorrhagic shock at this time. Hgb 7.3 today.   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 Ammonia level this morning 28. Patient still agitated on exam, not following commands.   -Continue lactuose, has been having BMs -Continue Synthroid  -Wean down on precedex drip as above -Goal RASS 0  Hypothyroidism  Noncompliant with medications. TSH 51 on admission.   -Continue synthroid   AKI Improving, could be 2/2 hemorrhagic shock which has improved. Cr 1.09 today from 1.52 on admission. K 3.1.   -Daily BMP -Replete electrolytes  Best practice (evaluated daily)  Diet: Tube feeds via NGT Pain/Anxiety/Delirium protocol (if indicated): On fentanyl and precedex VAP protocol (if indicated): in place DVT prophylaxis: SCDs GI prophylaxis: PPI Glucose control: SSI Mobility: bedrest Disposition: ICU Family communication: November 28, 2020 Daughter updated.  Goals of Care:   Claudean Severance, M.D. [facility] 11/29/2020 7:58 AM

## 2020-11-29 NOTE — Progress Notes (Addendum)
Daily Rounding Note  11/29/2020, 11:36 AM  LOS: 6 days   SUBJECTIVE:   Chief complaint: Upper GI bleed from esophageal varices.  Also has gastric ulcers and gastric varices.  Cirrhosis.  Remains on vent. Lexi seal in place, evacuating liquid brown stool.  OBJECTIVE:         Vital signs in last 24 hours:    Temp:  [96.1 F (35.6 C)-97.8 F (36.6 C)] 97.8 F (36.6 C) (01/26 0800) Pulse Rate:  [46-101] 91 (01/26 1130) Resp:  [0-27] 20 (01/26 1130) BP: (58-176)/(21-125) 124/78 (01/26 1130) SpO2:  [85 %-100 %] 96 % (01/26 1130) FiO2 (%):  [30 %-60 %] 40 % (01/26 1122) Weight:  [84 kg] 84 kg (01/26 0500) Last BM Date: 11/29/20 Filed Weights   11/26/20 0357 11/27/20 0500 11/29/20 0500  Weight: 82.1 kg 83.9 kg 84 kg   General: Eyes are open, somewhat agitated movement of limbs. Heart: RRR. Chest: On vent.  Lungs clear bilaterally.  No labored breathing. Abdomen: Large, soft, hyperactive bowel sounds.  Not tender. Extremities:  Upper extremity>> lower extremity edema.  No pitting. Neuro/Psych: Not following commands.  Moving all 4 limbs and nonpurposeful manner.  Lower legs are off the side of the bed.  Intake/Output from previous day: 01/25 0701 - 01/26 0700 In: 4652.4 [I.V.:505.6; Blood:315; NG/GT:2370.7; IV Piggyback:1461.1] Out: 2950 [Urine:1950; Stool:1000]  Intake/Output this shift: Total I/O In: 420.2 [I.V.:117.5; NG/GT:240; IV Piggyback:62.6] Out: -   Lab Results: Recent Labs    11/28/20 0530 11/28/20 1543 11/29/20 0613  WBC 11.3* 12.0* 10.8*  HGB 6.5* 7.6* 7.3*  HCT 21.6* 24.6* 23.3*  PLT 128* 133* 111*   BMET Recent Labs    11/28/20 0530 11/28/20 1111 11/29/20 0613  NA 147* 147* 145  K 2.8* 3.4* 3.1*  CL 107 107 107  CO2 27 27 27   GLUCOSE 126* 127* 151*  BUN 29* 29* 23  CREATININE 1.53* 1.48* 1.09*  CALCIUM 8.4* 8.4* 8.1*   LFT Recent Labs    11/27/20 0523 11/28/20 0530  11/29/20 0613  PROT 5.4* 6.1* 5.4*  ALBUMIN 2.1* 3.0* 2.9*  AST 40 44* 57*  ALT 16 15 19   ALKPHOS 97 81 86  BILITOT 1.0 0.9 0.9   PT/INR No results for input(s): LABPROT, INR in the last 72 hours. Hepatitis Panel No results for input(s): HEPBSAG, HCVAB, HEPAIGM, HEPBIGM in the last 72 hours.  Studies/Results: DG Chest Port 1 View  Result Date: 11/29/2020 CLINICAL DATA:  Abnormal respirations. EXAM: PORTABLE CHEST 1 VIEW COMPARISON:  11/27/2020. FINDINGS: Interim placement of feeding tube, its tip is below the left hemidiaphragm. Endotracheal tube, left IJ line stable position. Cardiomegaly. Bilateral mild interstitial prominence. Interstitial edema and/or pneumonitis can not be excluded. Bibasilar atelectasis. Small bilateral pleural effusions cannot be excluded. IMPRESSION: 1. Interim placement of feeding tube, its tip is below left hemidiaphragm. Endotracheal tube and left IJ line stable position. 2.  Cardiomegaly. 3. Mild bilateral interstitial prominence. Interstitial edema and/or pneumonitis cannot be excluded. Electronically Signed   By: 12/01/2020  Register   On: 11/29/2020 05:38    ASSESMENT:   *   Upper GI bleed due to esophageal varices. 11/23/2020 EGD with banding of large esophageal varices w stigmata of bleeding, felt to be source of bleeding..  Small, nonbleeding gastric varices.  Large, nonbleeding cratered gastric ulcer Completed octreotide drip, Protonix drip.  Now on Protonix suspension 40 mg bid.   Day 7 of IV Rocephin for SBP  prophylaxis. Elevated H pylori IgA and IgG but normal IgM. Stools currently brown, liquid.  Colace, lactulose and MiraLAX in place.    *    Blood loss anemia. Has received a total of 5 PRBCs, the latest single unit was 11/28/2020. Hgb 6.5 >> 1 PRBC on 1/25 >> 7.3.  *    Acute respiratory failure, hypoxia.  Initially intubated for airway protection but difficulty weaning off vent due to encephalopathy (ammonia level normal).  *  Hx PV  thrombosis.  *    Coagulopathy.  Received FFP x 2 on 11/23/2020 INR 1.3 on 11/24/2020  *    Thrombocytopenia.  Gradual decline.  Platelets 111 today.  *    Hypokalemia.  *    E. Coli and Morganella UTI.  Day 7 of IV Rocephin.    PLAN   *    DC MiraLAX, colace. Leave lactulose in place.    *     Eventually give course of Pylera (or other H Pylori eradication formulary) for H Pylori.  This is not urgent and would contemplate treating her several weeks post convalescent from current illness when she is reliably taking PO.  Jennye Moccasin  11/29/2020, 11:36 AM Phone [phone]  GI ATTENDING  Interval history data reviewed.  Patient seen and examined.  Agree with interval comprehensive progress note as outlined above.  No overt GI bleeding.  On multiple agents to promote bowel movements.  Equivocal Helicobacter pylori testing.  Encephalopathy felt to be multifactorial.  Even though her ammonia is normal, I would continue lactulose as an agent to keep her bowels moving and remove any element of concern regarding potential hepatic encephalopathy.  Otherwise, continue PPI; transfuse as needed; management of multiple other significant problems per primary service.  We are available for questions or problems, but will sign off at this time.  Thank you.  Wilhemina Bonito. Eda Keys., M.D. [facility] Division of Gastroenterology

## 2020-11-30 DIAGNOSIS — G9341 Metabolic encephalopathy: Secondary | ICD-10-CM | POA: Diagnosis not present

## 2020-11-30 DIAGNOSIS — K922 Gastrointestinal hemorrhage, unspecified: Secondary | ICD-10-CM | POA: Diagnosis not present

## 2020-11-30 DIAGNOSIS — J9601 Acute respiratory failure with hypoxia: Secondary | ICD-10-CM | POA: Diagnosis not present

## 2020-11-30 LAB — CBC
HCT: 24.1 % — ABNORMAL LOW (ref 36.0–46.0)
Hemoglobin: 7.3 g/dL — ABNORMAL LOW (ref 12.0–15.0)
MCH: 28.4 pg (ref 26.0–34.0)
MCHC: 30.3 g/dL (ref 30.0–36.0)
MCV: 93.8 fL (ref 80.0–100.0)
Platelets: 129 10*3/uL — ABNORMAL LOW (ref 150–400)
RBC: 2.57 MIL/uL — ABNORMAL LOW (ref 3.87–5.11)
RDW: 17 % — ABNORMAL HIGH (ref 11.5–15.5)
WBC: 10.6 10*3/uL — ABNORMAL HIGH (ref 4.0–10.5)
nRBC: 1.3 % — ABNORMAL HIGH (ref 0.0–0.2)

## 2020-11-30 LAB — GLUCOSE, CAPILLARY
Glucose-Capillary: 118 mg/dL — ABNORMAL HIGH (ref 70–99)
Glucose-Capillary: 123 mg/dL — ABNORMAL HIGH (ref 70–99)
Glucose-Capillary: 132 mg/dL — ABNORMAL HIGH (ref 70–99)
Glucose-Capillary: 72 mg/dL (ref 70–99)
Glucose-Capillary: 73 mg/dL (ref 70–99)
Glucose-Capillary: 83 mg/dL (ref 70–99)
Glucose-Capillary: 93 mg/dL (ref 70–99)

## 2020-11-30 LAB — MAGNESIUM: Magnesium: 2.1 mg/dL (ref 1.7–2.4)

## 2020-11-30 LAB — BASIC METABOLIC PANEL
Anion gap: 10 (ref 5–15)
BUN: 20 mg/dL (ref 8–23)
CO2: 29 mmol/L (ref 22–32)
Calcium: 8.3 mg/dL — ABNORMAL LOW (ref 8.9–10.3)
Chloride: 104 mmol/L (ref 98–111)
Creatinine, Ser: 0.89 mg/dL (ref 0.44–1.00)
GFR, Estimated: >60 mL/min (ref >60)
Glucose, Bld: 142 mg/dL — ABNORMAL HIGH (ref 70–99)
Potassium: 3.3 mmol/L — ABNORMAL LOW (ref 3.5–5.1)
Sodium: 143 mmol/L (ref 135–145)

## 2020-11-30 LAB — PHOSPHORUS: Phosphorus: 2.6 mg/dL (ref 2.5–4.6)

## 2020-11-30 LAB — TSH: TSH: 22.682 u[IU]/mL — ABNORMAL HIGH (ref 0.350–4.500)

## 2020-11-30 MED ORDER — POTASSIUM CHLORIDE 20 MEQ PO PACK
40.0000 meq | PACK | Freq: Once | ORAL | Status: AC
  Administered 2020-11-30: 40 meq
  Filled 2020-11-30: qty 2

## 2020-11-30 MED ORDER — FUROSEMIDE 10 MG/ML IJ SOLN
20.0000 mg | Freq: Once | INTRAMUSCULAR | Status: AC
  Administered 2020-11-30: 20 mg via INTRAVENOUS
  Filled 2020-11-30: qty 2

## 2020-11-30 MED ORDER — RACEPINEPHRINE HCL 2.25 % IN NEBU
INHALATION_SOLUTION | RESPIRATORY_TRACT | Status: AC
  Administered 2020-11-30: 0.5 mL
  Filled 2020-11-30: qty 0.5

## 2020-11-30 MED ORDER — CHLORHEXIDINE GLUCONATE 0.12 % MT SOLN
15.0000 mL | Freq: Two times a day (BID) | OROMUCOSAL | Status: DC
  Administered 2020-11-30 – 2020-12-17 (×24): 15 mL via OROMUCOSAL
  Filled 2020-11-30 (×23): qty 15

## 2020-11-30 MED ORDER — POTASSIUM CHLORIDE 20 MEQ PO PACK: 20.0000 meq | PACK | ORAL | Status: DC

## 2020-11-30 MED ORDER — POTASSIUM CHLORIDE 10 MEQ/50ML IV SOLN
10.0000 meq | INTRAVENOUS | Status: AC
  Administered 2020-11-30 (×4): 10 meq via INTRAVENOUS
  Filled 2020-11-30 (×4): qty 50

## 2020-11-30 MED ORDER — ORAL CARE MOUTH RINSE
15.0000 mL | Freq: Two times a day (BID) | OROMUCOSAL | Status: DC
  Administered 2020-11-30 – 2020-12-19 (×30): 15 mL via OROMUCOSAL

## 2020-11-30 NOTE — Progress Notes (Signed)
Patient placed on bipap for WOB. RN made aware. Patients vitals are stable on bipap.

## 2020-11-30 NOTE — Procedures (Signed)
Extubation Procedure Note  Patient Details:   Name: Marce Schartz DOB: [birth year] MRN: [id number]   Airway Documentation:    Vent end date: 11/30/20 Vent end time: 1002   Evaluation  O2 sats: stable throughout Complications: No apparent complications Patient did tolerate procedure well. Bilateral Breath Sounds: Clear,Diminished   Yes   Patient extubated per MD order. Positive cuff leak. No stridor noted. Vitals are stable on 3L [place]. RN at bedside.  Jannelle Notaro H Vivika Poythress 11/30/2020, 10:03 AM

## 2020-11-30 NOTE — Progress Notes (Signed)
NAME:  Masie Bermingham, MRN:  [id number], DOB:  04/23/1941, LOS: 7 ADMISSION DATE:  11/23/2020, CONSULTATION DATE:  11/23/20 REFERRING MD:  Bernette Mayers CHIEF COMPLAINT:  AMS   Brief History:  18 yoF with history of hypothyroidism noncompliant presenting with progressive headaches, fatigue, confusion, and hypotension of unclear etiology.  History of Present Illness:  59 yoF presented with progressive headaches, fatigue, confusion, and hypotension. Over the past 3 days prior to admission, patient started having a debilitating headache with associated fatigue and confusion. Brought in by EMS, noted to have blood around nares. Patient was unable to provide history, daughter was at bedside. In the ED, had coffee ground/maroon colored hematemesis. Started on levophed and intubated to protect her airway. GI was consulted and she was started on octreotide and PPI infusion.  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 November 28, 2020 transfused 1 unit of packed red blood cells  Consults:  PCCM GI  Procedures:  Central venous cath 1/20  Significant Diagnostic Tests:  CT Head 1. No acute intracranial findings. 2. Periventricular white matter and corona radiata hypodensities favor chronic ischemic microvascular white matter disease.  CXR basilar atelectasis versus possible pneumonia (aspiration?)  EDG 1/20- Grade III esophageal varices, banded x6. Type I GE varices without bleeding. Nonbleeding gastric ulcers x3.   Micro Data:  covid negative   Antimicrobials:  Ceftriaxone 1/20 >>  Interim History / Subjective:    Remained on low dose neosyneprhien, continued on lactulose  and given lasix. Patient resting comfortably on exam, intermittently following commands.   Objective   Blood pressure 91/66, pulse 68, temperature 98.6 F (37 C), temperature source Axillary, resp. rate (!) 21, height 5\' 4"  (1.626 m), weight 84.2 kg, SpO2 97 %.    Vent Mode: PSV;CPAP FiO2 (%):  [40 %] 40 % PEEP:  [5 cmH20] 5 cmH20 Pressure Support:  [10 cmH20-12 cmH20] 10 cmH20   Intake/Output Summary (Last 24 hours) at 11/30/2020 0841 Last data filed at 11/30/2020 0800 Gross per 24 hour  Intake 3757.3 ml  Output 1700 ml  Net 2057.3 ml   Filed Weights   11/27/20 0500 11/29/20 0500 11/30/20 0500  Weight: 83.9 kg 84 kg 84.2 kg    Examination: General: Elderly female, NAD, sedated HEENT: ETT in place, NGT in place, LIJ in place Cardiac: RRR, no m/r/g Pulmonary: CTABL, no wheezing, rhonchi, rales Abdomen: Soft, non-tender, abdominal distension, dark stools in flexiseal Extremity: Trace BL LE swelling Neuro: Sedated, intermittently following commands  Currently on pressure support, has been on this overnight.   Labs and imaging reviewed: Sodium 143, potassium 3.3, bicarb 29, creatinine 0.89(down from 1.48 yesterday). Hemoglobin 7.3(stable), WBC 10.6, PLT 129  Resolved Hospital Problem list     Assessment & Plan:   Acute hypoxic respiratory failure requiring mechanical ventilation: Intubated for airway protection, acute encephalopathy likely 2/2 hepatic vs myxedema coma. Patient is currently on for ventilatory support, tolerating SBT well. Currently on ceftriaxone for possible pneumonia. CXR this morning showed stable BL  opacities. Patient following commands this morning.   -Full ventilator support -Daily SBT -Wean down on precedex drip, continue fentanyl pushes -Plan for extubation later this morning -Continue ceftriaxone for now, day 7  Variceal bleed, hemorrhagic shock- improved, status post  banding 11/23/20. Developed hypotension after being on precedex drip which  required neosynephrine drip. BP continues to be soft. Does not appear to be recurrence of hemorrhagic shock at this time since Hgb remains stable and no evidence of bleeding on exam.  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 TSH was up to 51 on admission. Patient more awake this morning, following commands. Weaning off precedex drip and planning on extubation.    -Continue lactuose -Continue Synthroid  -Wean down on precedex drip as above -Goal RASS 0  Hypothyroidism  Noncompliant with medications. TSH 51 on admission.   -Continue synthroid  -Repeat TSH today  AKI Improving, could be 2/2 hemorrhagic shock which has improved. Cr 0.89 today from 1.52 on admission. K 3.1.   -Daily BMP -Replete electrolytes  Best practice (evaluated daily)  Diet: Tube feeds via NGT Pain/Anxiety/Delirium protocol (if indicated): On fentanyl and precedex VAP protocol (if indicated): in place DVT prophylaxis: SCDs GI prophylaxis: PPI Glucose control: SSI Mobility: bedrest Disposition: ICU Family communication: November 28, 2020 Daughter updated.  Goals of Care:   Claudean Severance, M.D. [facility] 11/30/2020 8:41 AM

## 2020-11-30 NOTE — Progress Notes (Signed)
Pt removed from BIPAP and placed on 5L [place]. Pt is tolerating well at this time. °

## 2020-11-30 NOTE — Progress Notes (Signed)
PT Cancellation Note  Patient Details Name: Kaylee King MRN: [id number] DOB: [birth year]   Cancelled Treatment:    Reason Eval/Treat Not Completed: Medical issues which prohibited therapy - pt on and off bipap at this time, difficulty maintaining sats. PT to check back tomorrow per RN request.  Marye Round, PT Acute Rehabilitation Services Pager [phone]  Office [phone]    [name] E Christain [place] 11/30/2020, 3:01 PM

## 2020-12-01 DIAGNOSIS — J9601 Acute respiratory failure with hypoxia: Secondary | ICD-10-CM | POA: Diagnosis not present

## 2020-12-01 DIAGNOSIS — G9341 Metabolic encephalopathy: Secondary | ICD-10-CM | POA: Diagnosis not present

## 2020-12-01 LAB — BASIC METABOLIC PANEL
Anion gap: 4 — ABNORMAL LOW (ref 5–15)
Anion gap: 11 (ref 5–15)
BUN: 18 mg/dL (ref 8–23)
BUN: 17 mg/dL (ref 8–23)
CO2: 28 mmol/L (ref 22–32)
CO2: 28 mmol/L (ref 22–32)
Calcium: 8.4 mg/dL — ABNORMAL LOW (ref 8.9–10.3)
Calcium: 8.9 mg/dL (ref 8.9–10.3)
Chloride: 106 mmol/L (ref 98–111)
Chloride: 101 mmol/L (ref 98–111)
Creatinine, Ser: 0.93 mg/dL (ref 0.44–1.00)
Creatinine, Ser: 0.95 mg/dL (ref 0.44–1.00)
GFR, Estimated: >60 mL/min (ref >60)
GFR, Estimated: >60 mL/min (ref >60)
Glucose, Bld: 102 mg/dL — ABNORMAL HIGH (ref 70–99)
Glucose, Bld: 105 mg/dL — ABNORMAL HIGH (ref 70–99)
Potassium: >7.5 mmol/L (ref 3.5–5.1)
Potassium: 3.9 mmol/L (ref 3.5–5.1)
Sodium: 138 mmol/L (ref 135–145)
Sodium: 140 mmol/L (ref 135–145)

## 2020-12-01 LAB — CBC
HCT: 24.1 % — ABNORMAL LOW (ref 36.0–46.0)
Hemoglobin: 7.7 g/dL — ABNORMAL LOW (ref 12.0–15.0)
MCH: 30.1 pg (ref 26.0–34.0)
MCHC: 32 g/dL (ref 30.0–36.0)
MCV: 94.1 fL (ref 80.0–100.0)
Platelets: 119 10*3/uL — ABNORMAL LOW (ref 150–400)
RBC: 2.56 MIL/uL — ABNORMAL LOW (ref 3.87–5.11)
RDW: 17.2 % — ABNORMAL HIGH (ref 11.5–15.5)
WBC: 8.9 10*3/uL (ref 4.0–10.5)
nRBC: 1.9 % — ABNORMAL HIGH (ref 0.0–0.2)

## 2020-12-01 LAB — GLUCOSE, CAPILLARY
Glucose-Capillary: 99 mg/dL (ref 70–99)
Glucose-Capillary: 88 mg/dL (ref 70–99)
Glucose-Capillary: 139 mg/dL — ABNORMAL HIGH (ref 70–99)
Glucose-Capillary: 154 mg/dL — ABNORMAL HIGH (ref 70–99)
Glucose-Capillary: 161 mg/dL — ABNORMAL HIGH (ref 70–99)
Glucose-Capillary: 114 mg/dL — ABNORMAL HIGH (ref 70–99)

## 2020-12-01 LAB — MAGNESIUM: Magnesium: 1.9 mg/dL (ref 1.7–2.4)

## 2020-12-01 LAB — PHOSPHORUS: Phosphorus: 2.6 mg/dL (ref 2.5–4.6)

## 2020-12-01 MED ORDER — RIFAXIMIN 550 MG PO TABS
550.0000 mg | ORAL_TABLET | Freq: Two times a day (BID) | ORAL | Status: DC
  Administered 2020-12-01 – 2020-12-13 (×23): 550 mg
  Filled 2020-12-01 (×28): qty 1

## 2020-12-01 MED ORDER — LACTULOSE 10 GM/15ML PO SOLN
30.0000 g | Freq: Three times a day (TID) | ORAL | Status: DC
  Administered 2020-12-01 – 2020-12-06 (×16): 30 g
  Filled 2020-12-01 (×16): qty 45

## 2020-12-01 NOTE — Evaluation (Signed)
Clinical/Bedside Swallow Evaluation Patient Details  Name: Kaylee King MRN: [id number] Date of Birth: 02-01-1941  Today's Date: 12/01/2020 Time: SLP Start Time (ACUTE ONLY): 1215 SLP Stop Time (ACUTE ONLY): 1230 SLP Time Calculation (min) (ACUTE ONLY): 15 min  Past Medical History:  Past Medical History:  Diagnosis Date  . Cirrhosis (HCC)   . Thyroid disease    hypothyroid    Past Surgical History:  Past Surgical History:  Procedure Laterality Date  . ESOPHAGEAL BANDING  11/23/2020   Procedure: ESOPHAGEAL BANDING;  Surgeon: Beverley Fiedler, MD;  Location: [facility] ENDOSCOPY;  Service: Gastroenterology;;  . ESOPHAGOGASTRODUODENOSCOPY N/A 11/23/2020   Procedure: ESOPHAGOGASTRODUODENOSCOPY (EGD);  Surgeon: Beverley Fiedler, MD;  Location[facility] ENDOSCOPY;  Service: Gastroenterology;  Laterality: N/A;   HPI:  Pt is an 80 year old female with history of hypothyroidism noncompliant who presented with progressive headaches, fatigue, confusion, and hypotension of unclear etiology. In the ED, had coffee ground/maroon colored hematemesis and was intubated for airway protection. CXR: Interim placement of feeding tube, its tip is below left hemidiaphragm. CT head 1/20 was negative. Pt found to have upper GI bleed from esophageal varices and is followed by GI. EDG 1/20- Grade III esophageal varices, banded x6. Type I GE varices without bleeding. Nonbleeding gastric ulcers x3. ETT 1/20-1/27.   Assessment / Plan / Recommendation Clinical Impression  Pt was seen for bedside swallow evaluation. Pt was lethargic and exhibited difficulty maintaining an adequate level of alertness for prolonged periods of time. Pt did not follow the necessary commands for completion of a full oral mechanism exam. Oral inspection revealed dried secretions on the superior lingual surface and an edentulous status. Oral care was provided and pt demonstrated improved alertness thereafter. Bolus sizes were small and trials limited to a single bolus  of thin liquids via straw, two boluses of ice chips, and a 1/2 tsp bolus of puree due to pt's refusal to accept additional trials. No s/sx of aspiration were noted, but increased WOB was demonstrated following thin liquids via straw. Considering pt's prolonged length of intubation, her current lethargy, and the limited nature of this evaluation, it is recommended that the pt's NPO status be maintained. Pt may have ice chips after oral care if her level of alertness is adequate. SLP will follow to further assess swallow function and for initiation of a p.o. diet as clinically indicated. SLP Visit Diagnosis: Dysphagia, unspecified (R13.10)    Aspiration Risk  Mild aspiration risk    Diet Recommendation NPO;Alternative means - temporary;Ice chips PRN after oral care   Medication Administration: Via alternative means Postural Changes: Seated upright at 90 degrees    Other  Recommendations     Follow up Recommendations  (TBD)      Frequency and Duration min 2x/week  2 weeks       Prognosis Prognosis for Safe Diet Advancement: Good Barriers to Reach Goals: Cognitive deficits      Swallow Study   General Date of Onset: 11/30/20 HPI: Pt is an 80 year old female with history of hypothyroidism noncompliant who presented with progressive headaches, fatigue, confusion, and hypotension of unclear etiology. In the ED, had coffee ground/maroon colored hematemesis and was intubated for airway protection. CXR: Interim placement of feeding tube, its tip is below left hemidiaphragm. CT head 1/20 was negative. Pt found to have upper GI bleed from esophageal varices and is followed by GI. EDG 1/20- Grade III esophageal varices, banded x6. Type I GE varices without bleeding. Nonbleeding gastric ulcers x3. ETT 1/20-1/27. Type of  Study: Bedside Swallow Evaluation Previous Swallow Assessment: None Diet Prior to this Study: NPO;NG Tube Temperature Spikes Noted: No Respiratory Status: Nasal cannula History of  Recent Intubation: Yes Length of Intubations (days): 7 days Date extubated: 11/30/20 Behavior/Cognition: Cooperative;Pleasant mood;Lethargic/Drowsy Oral Cavity Assessment: Dried secretions Oral Care Completed by SLP: Yes Oral Cavity - Dentition: Edentulous Vision: Functional for self-feeding Self-Feeding Abilities: Needs assist Patient Positioning: Upright in bed;Postural control adequate for testing Baseline Vocal Quality: Low vocal intensity;Breathy Volitional Cough: Cognitively unable to elicit Volitional Swallow: Able to elicit    Oral/Motor/Sensory Function Overall Oral Motor/Sensory Function:  (Pt did not follow the necessary commands for completion)   Ice Chips Ice chips: Within functional limits Presentation: Spoon   Thin Liquid Thin Liquid: Impaired Pharyngeal  Phase Impairments: Other (comments) (Increased WOB thereafter)    Nectar Thick Nectar Thick Liquid: Not tested   Honey Thick Honey Thick Liquid: Not tested   Puree Puree: Within functional limits Presentation: Spoon   Solid     Solid: Not tested (Pt refused)     Kaylee King I. Vear Clock, MS, CCC-SLP Acute Rehabilitation Services Office number [phone] Pager [phone]   Kaylee King 12/01/2020,12:35 PM

## 2020-12-01 NOTE — Progress Notes (Signed)
Discussed potassium >7.5 w/ Dr. Francine Graven. Verbal order for redraw of BMP and for 12-lead EKG.

## 2020-12-01 NOTE — Evaluation (Signed)
Physical Therapy Evaluation Patient Details Name: Kaylee King MRN: [id number] DOB: Mar 13, 1941 Today's Date: 12/01/2020   History of Present Illness  80 year old female with history of hypothyroidism, medical noncompliance, NASH cirrhosis, presents to [facility] on 1/20 with progressive headaches, fatigue, confusion, and hypotension of unclear etiology. In the ED, had coffee ground/maroon colored hematemesis and was intubated for airway protection. CT head 1/20 was negative. Pt found to have upper GI bleed from esophageal varices and is followed by GI. EDG 1/20- Grade III esophageal varices, banded x6. Type I GE varices without bleeding. Nonbleeding gastric ulcers x3. ETT 1/20-1/27.  Clinical Impression   Pt presents with AMS, impaired command following, generalized weakness, severe difficulty performing mobility tasks, and poor sitting balance. Pt to benefit from acute PT to address deficits. Pt required total +2 assist for to/from EOB, pt tolerating EOB sitting briefly before needing return to supine. PT unsure of pt baseline, no family present and pt not answering all subjective questions. PT recommending SNF level of care post-acutely. PT to progress mobility as tolerated, and will continue to follow acutely.      Follow Up Recommendations SNF;Supervision/Assistance - 24 hour    Equipment Recommendations  Other (comment) (TBD)    Recommendations for Other Services       Precautions / Restrictions Precautions Precautions: Fall Restrictions Weight Bearing Restrictions: No      Mobility  Bed Mobility Overal bed mobility: Needs Assistance Bed Mobility: Supine to Sit;Sit to Supine     Supine to sit: Total assist;+2 for physical assistance;+2 for safety/equipment;HOB elevated Sit to supine: Total assist;+2 for physical assistance;+2 for safety/equipment;HOB elevated   General bed mobility comments: total +2 for trunk and LE management, scooting to/from EOB.    Transfers                  General transfer comment: unable today  Ambulation/Gait                Stairs            Wheelchair Mobility    Modified Rankin (Stroke Patients Only)       Balance Overall balance assessment: Needs assistance Sitting-balance support: Feet supported Sitting balance-Leahy Scale: Poor Sitting balance - Comments: reliant on truncal support Postural control: Posterior lean                                   Pertinent Vitals/Pain Pain Assessment: Faces Faces Pain Scale: Hurts little more Pain Location: generalized, during mobility Pain Descriptors / Indicators: Grimacing;Discomfort;Guarding Pain Intervention(s): Limited activity within patient's tolerance;Monitored during session;Repositioned    Home Living Family/patient expects to be discharged to:: Unsure (pt states "yes" to living in a private residence alone, unsure of accuracy) Living Arrangements: Alone Available Help at Discharge: Other (Comment) (unsure, pt answering very few subjective questions)                  Prior Function           Comments: unsure of pt baseline, pt reports 0 falls but has several bruises on arms/legs     Hand Dominance        Extremity/Trunk Assessment        Lower Extremity Assessment Lower Extremity Assessment: Generalized weakness;RLE deficits/detail;LLE deficits/detail RLE Deficits / Details: guarding when moving pt into knee flexion RLE: Unable to fully assess due to pain LLE Deficits / Details: same as above LLE: Unable to  fully assess due to pain    Cervical / Trunk Assessment Cervical / Trunk Assessment: Normal  Communication   Communication: Expressive difficulties (nods yes/no, limited vocalizations)  Cognition Arousal/Alertness: Lethargic Behavior During Therapy: Anxious Overall Cognitive Status: Difficult to assess Area of Impairment: Attention;Following commands;Safety/judgement;Problem solving;Awareness                    Current Attention Level: Focused   Following Commands: Follows one step commands inconsistently Safety/Judgement: Decreased awareness of safety;Decreased awareness of deficits Awareness: Intellectual Problem Solving: Slow processing;Decreased initiation;Difficulty sequencing;Requires verbal cues;Requires tactile cues General Comments: Pt is lethargic during session, makes very little vocalizations to questions. Does state she goes by Kaylee King. Anxious and guarding during most of mobility.      General Comments General comments (skin integrity, edema, etc.): vss, on 5.5LO2 via [place]    Exercises General Exercises - Lower Extremity Heel Slides: AAROM;Both;5 reps;Supine   Assessment/Plan    PT Assessment Patient needs continued PT services  PT Problem List Decreased strength;Decreased mobility;Decreased activity tolerance;Decreased balance;Decreased knowledge of use of DME;Pain;Decreased cognition       PT Treatment Interventions DME instruction;Therapeutic activities;Gait training;Therapeutic exercise;Patient/family education;Balance training;Functional mobility training;Neuromuscular re-education    PT Goals (Current goals can be found in the Care Plan section)  Acute Rehab PT Goals PT Goal Formulation: Patient unable to participate in goal setting Time For Goal Achievement: 12/15/20 Potential to Achieve Goals: Fair    Frequency Min 2X/week   Barriers to discharge        Co-evaluation PT/OT/SLP Co-Evaluation/Treatment: Yes Reason for Co-Treatment: Complexity of the patient's impairments (multi-system involvement);For patient/therapist safety;To address functional/ADL transfers;Necessary to address cognition/behavior during functional activity PT goals addressed during session: Mobility/safety with mobility;Balance         AM-PAC PT "6 Clicks" Mobility  Outcome Measure Help needed turning from your back to your side while in a flat bed without using bedrails?:  Total Help needed moving from lying on your back to sitting on the side of a flat bed without using bedrails?: Total Help needed moving to and from a bed to a chair (including a wheelchair)?: Total Help needed standing up from a chair using your arms (e.g., wheelchair or bedside chair)?: Total Help needed to walk in hospital room?: Total Help needed climbing 3-5 steps with a railing? : Total 6 Click Score: 6    End of Session Equipment Utilized During Treatment: Oxygen Activity Tolerance: Patient limited by fatigue Patient left: in bed;with call bell/phone within reach;with bed alarm set Nurse Communication: Mobility status PT Visit Diagnosis: Other abnormalities of gait and mobility (R26.89);Difficulty in walking, not elsewhere classified (R26.2)    Time: 5573-2202 PT Time Calculation (min) (ACUTE ONLY): 25 min   Charges:   PT Evaluation $PT Eval Low Complexity: 1 Low         Shantara Goosby S, PT Acute Rehabilitation Services Pager [phone]  Office [phone]   Truddie Coco 12/01/2020, 3:45 PM

## 2020-12-01 NOTE — Progress Notes (Signed)
Nutrition Follow-up  DOCUMENTATION CODES:   Not applicable  INTERVENTION:   Tube feeding via Cortrak tube:  Osmolite 1.5 at 60 ml/hr Pro-source TF 45 mL daily Provides 1768 kcals, 91 g of protein and 1166 mL of free water  200 ml free water every 4 hours Total free water: 2366 ml   NUTRITION DIAGNOSIS:   Inadequate oral intake related to acute illness as evidenced by NPO status. Ongoing.   GOAL:   Patient will meet greater than or equal to 90% of their needs Met with TF.   MONITOR:   TF tolerance,Diet advancement  REASON FOR ASSESSMENT:   Consult,Ventilator Enteral/tube feeding initiation and management  ASSESSMENT:   80 yo female admitted with coffee ground/maroon hematemesis with UGIB,shock, acute encephalopathy. PMH NASH cirrhosis, HTN   Pt discussed during ICU rounds and with RN.  Pt extubated but remains NPO. Pt failed swallow evaluation due to lethargy.   1/20 Admitted, Intubated, EGD: 4 columns of large/grade 3 esophageal varicessix blands placed, clotted blood in gastric fundus-large clots removed 1/24 cortrak placed; tip gastric     Labs: CBG's: 88-139  Meds: ss novolog, lactulose  I&O: +10 L  Diet Order:   Diet Order            Diet NPO time specified  Diet effective now                 EDUCATION NEEDS:   Not appropriate for education at this time  Skin:  Skin Assessment: Skin Integrity Issues: Skin Integrity Issues:: Stage II Stage II: sacrum, elbow  Last BM:  475 ml via rectal tube (on lactulose)  Height:   Ht Readings from Last 1 Encounters:  11/23/20 '5\' 4"'  (1.626 m)    Weight:   Wt Readings from Last 1 Encounters:  12/01/20 77.6 kg   BMI:  Body mass index is 29.37 kg/m.  Estimated Nutritional Needs:   Kcal:  1650-1850 kcals  Protein:  85-100 g  Fluid:  >/= 1.7 L   Brad Lieurance P., RD, LDN, CNSC See AMiON for contact information

## 2020-12-01 NOTE — Progress Notes (Addendum)
NAME:  Kaylee King, MRN:  [id number], DOB:  [birth year], LOS: 8 ADMISSION DATE:  11/23/2020, CONSULTATION DATE:  11/23/20 REFERRING MD:  Bernette Mayers CHIEF COMPLAINT:  AMS   Brief History:  18 yoF with history of hypothyroidism noncompliant presenting with progressive headaches, fatigue, confusion, and hypotension of unclear etiology.  History of Present Illness:  61 yoF presented with progressive headaches, fatigue, confusion, and hypotension. Over the past 3 days prior to admission, patient started having a debilitating headache with associated fatigue and confusion. Brought in by EMS, noted to have blood around nares. Patient was unable to provide history, daughter was at bedside. In the ED, had coffee ground/maroon colored hematemesis. Started on levophed and intubated to protect her airway. GI was consulted and she was started on octreotide and PPI infusion.  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 November 28, 2020 transfused 1 unit of packed red blood cells  Consults:  PCCM GI  Procedures:  Central venous cath 1/20  Significant Diagnostic Tests:  CT Head 1. No acute intracranial findings. 2. Periventricular white matter and corona radiata hypodensities favor chronic ischemic microvascular white matter disease.  CXR basilar atelectasis versus possible pneumonia (aspiration?)  EDG 1/20- Grade III esophageal varices, banded x6. Type I GE varices without bleeding. Nonbleeding gastric ulcers x3.   Micro Data:  covid negative   Antimicrobials:  Ceftriaxone 1/20 >>  Interim History / Subjective:    Patient extubated yesterday, initially required BiPAP and  transitioned to [place]. Breathing is stable on 4L [place].  Patient is awake and alert, answering questions. She denies any pain, reports she is feeling okay.   Objective   Blood pressure (!) 143/75, pulse 88, temperature 98.8 F (37.1 C), temperature source Oral, resp. rate (!) 21, height 5\' 4"  (1.626 m), weight 77.6 kg, SpO2 99 %.    Vent Mode: PSV;BIPAP FiO2 (%):  [60 %] 60 % Set Rate:  [15 bmp] 15 bmp PEEP:  [8 cmH20] 8 cmH20   Intake/Output Summary (Last 24 hours) at 12/01/2020 0802 Last data filed at 12/01/2020 0600 Gross per 24 hour  Intake 1092.85 ml  Output 2420 ml  Net -1327.15 ml   Filed Weights   11/29/20 0500 11/30/20 0500 12/01/20 0500  Weight: 84 kg 84.2 kg 77.6 kg    Examination: General: Elderly female, NAD HEENT: [place] in place, AT/[place], dry mucosa Cardiac: RRR, no m/r/g Pulmonary: CTABL, no rhonchi, rales Abdomen: Soft, non-tender, non-distended, dark stools Extremity: No LE swelling Neuro: Awake, tired appearing, follows commands, oriented to person and place, not to time, moves upper and lower extremities    Labs and imaging reviewed: Sodium 138, potassium >7.5, bicarb 28, creatinine 0.93. TSH 22. Phos 2.6, Mag 1.9.  Hemoglobin 7.7(stable), WBC 8.9, PLT 119  Resolved Hospital Problem list     Assessment & Plan:   Acute hypoxic respiratory failure requiring mechanical ventilation: Intubated for airway protection, acute encephalopathy likely 2/2 hepatic vs myxedema coma. Extubated 1/27, initially on BiPAP, now down to 4L [place].  Was on ceftriaxone for possible pneumonia as well as SBP prophylaxis, completed 7 days. CXR this morning showed stable BL  Opacities. Patient has remained afebrile with no leukocytosis.   -Continue supplemental O2 PRN, wean as tolerated -Patient  doing well from respiratory stand point -Consider transfer out of ICU later today  Variceal bleed, hemorrhagic shock- improved, status post banding 11/23/20. Developed hypotension after being on  precedex drip which required neosynephrine drip, off drip now.  GI recommended continuing lactulose. Vitals have remained stable. Hgb has remained stable, no evidence of bleeding on exam.   -Monitor Hgb with daily CBC -Transfuse per protocol -Continue PPI BID  Acute encephalopathy -improving Likely 2/2 hepatic encephalopathy vs myxedema coma. TSH was up to 51 on admission, down to 22 today. Patient more awake and alert today, answering questions and following commands.   -Increase lactuose -Add rifaximin -Continue Synthroid 100 mcg/day -PT/OT  Cirrhosis 2/2 NASH: Continue lactulose. Started on rifaximin as above. Had issues obtaining abdominal imaging. Started on rocephin for SBP prophylaxis, completed course. History of ascites noted in 2018 on chart review, with resolution of ascites on repeating imaging. Patient has received IV lasix while here for acute respiratory failure and her ins and outs being net positive during admission. Does not appear significantly volume overloaded on exam.   -Continue lactulose -Continue rifaximin -Consider starting lasix and spironolatone if she becomes hypervolemic  Hypothyroidism  Noncompliant with medications. TSH 51 on admission. Repeat TSH 22.   -Continue synthroid 100 mcg daily -Repeat TSH outpatient   AKI Hyperkalemia Improving, could be 2/2 hemorrhagic shock which has improved. Cr 1.52 on admission. Cr 0.93 today. K >7.5 noted on this mornings labs, no evidence of hemolysis noted. K yesterday was 3.2. Repeat BMP was obtained that showed K 3.9. Stat EKG obtained, showed low voltage, regular rhythm, no peaked T waves  -Repeat BMP in AM  Best practice (evaluated daily)  Diet: Tube feeds via NGT Pain/Anxiety/Delirium protocol (if indicated): On fentanyl and precedex VAP protocol (if indicated): in place DVT prophylaxis: SCDs GI prophylaxis: PPI Glucose control: SSI Mobility: bedrest Disposition: ICU Family communication: November 30, 2020 Daughter updated.  Goals of Care:   Claudean Severance, M.D. [facility] 12/01/2020 8:02 AM

## 2020-12-01 NOTE — Evaluation (Signed)
Occupational Therapy Evaluation Patient Details Name: Kaylee King MRN: [id number] DOB: 1941-01-14 Today's Date: 12/01/2020    History of Present Illness 80 year old female with history of hypothyroidism, medical noncompliance, NASH cirrhosis, presents to [facility] on 1/20 with progressive headaches, fatigue, confusion, and hypotension of unclear etiology. In the ED, had coffee ground/maroon colored hematemesis and was intubated for airway protection. CT head 1/20 was negative. Pt found to have upper GI bleed from esophageal varices and is followed by GI. EDG 1/20- Grade III esophageal varices, banded x6. Type I GE varices without bleeding. Nonbleeding gastric ulcers x3. ETT 1/20-1/27.   Clinical Impression   PT admitted with AMS respiratory failure. Pt currently with functional limitiations due to the deficits listed below (see OT problem list). Pt currenlty total (A) for all care and decreased arousal . Pt with extensive oral care total (A)  Pt will benefit from skilled OT to increase their independence and safety with adls and balance to allow discharge SNF. Recommendation palliative consult      Follow Up Recommendations  SNF    Equipment Recommendations  Wheelchair (measurements OT);Wheelchair cushion (measurements OT);Hospital bed (hoyer)    Recommendations for Other Services Other (comment) (palliative)     Precautions / Restrictions Precautions Precautions: Fall Precaution Comments: flexiseal, aline L neck NG Restrictions Weight Bearing Restrictions: No      Mobility Bed Mobility Overal bed mobility: Needs Assistance Bed Mobility: Supine to Sit;Sit to Supine     Supine to sit: Total assist;+2 for physical assistance;+2 for safety/equipment;HOB elevated Sit to supine: Total assist;+2 for physical assistance;+2 for safety/equipment;HOB elevated   General bed mobility comments: total +2 for trunk and LE management, scooting to/from EOB.    Transfers                  General transfer comment: NT    Balance Overall balance assessment: Needs assistance Sitting-balance support: Feet supported Sitting balance-Leahy Scale: Poor Sitting balance - Comments: reliant on truncal support Postural control: Posterior lean                                 ADL either performed or assessed with clinical judgement   ADL Overall ADL's : Needs assistance/impaired                                       General ADL Comments: total (A) from all adls     Vision   Vision Assessment?:  (unable to assess)     Perception     Praxis      Pertinent Vitals/Pain Pain Assessment: Faces Faces Pain Scale: Hurts little more Pain Location: generalized, during mobility Pain Descriptors / Indicators: Grimacing;Discomfort;Guarding Pain Intervention(s): Monitored during session;Repositioned     Hand Dominance  (difficult to determine due to overall weakness)   Extremity/Trunk Assessment Upper Extremity Assessment Upper Extremity Assessment: RUE deficits/detail;Generalized weakness;LUE deficits/detail;Difficult to assess due to impaired cognition RUE Coordination: decreased gross motor;decreased fine motor LUE Coordination: decreased fine motor;decreased gross motor   Lower Extremity Assessment Lower Extremity Assessment: Generalized weakness;RLE deficits/detail;LLE deficits/detail RLE Deficits / Details: guarding when moving pt into knee flexion RLE: Unable to fully assess due to pain LLE Deficits / Details: same as above LLE: Unable to fully assess due to pain   Cervical / Trunk Assessment Cervical / Trunk Assessment: Kyphotic   Communication Communication Communication:  Expressive difficulties   Cognition Arousal/Alertness: Lethargic Behavior During Therapy: Anxious Overall Cognitive Status: Difficult to assess Area of Impairment: Attention;Following commands;Safety/judgement;Problem solving;Awareness                    Current Attention Level: Focused   Following Commands: Follows one step commands inconsistently Safety/Judgement: Decreased awareness of safety;Decreased awareness of deficits Awareness: Intellectual Problem Solving: Slow processing;Decreased initiation;Difficulty sequencing;Requires verbal cues;Requires tactile cues General Comments: Pt is lethargic during session, makes very little vocalizations to questions. Does state she goes by Kaylee King. Anxious and guarding during most of mobility.   General Comments  5.5 L [place] -- pt 100% supine with ear sittitng poor wave form but demonstrates accessory breathing and monitor 73%. pt does demonstrate decreased 02 but difficult to get true reading. pt noted to have bruising at all finger tips on the right and some color changes at the ball of bil feet    Exercises Exercises: Other exercises General Exercises - Lower Extremity Heel Slides: AAROM;Both;5 reps;Supine Other Exercises Other Exercises: PROM of all extremities and digits Other Exercises: provided towel rolls in bil palms due to clinched with nail marks in palm Recommend palm guard next sesssion if continued to be lethargic   Shoulder Instructions      Home Living Family/patient expects to be discharged to:: Unsure Living Arrangements: Alone Available Help at Discharge: Other (Comment) (unsure, pt answering very few subjective questions)                             Additional Comments: pt reports living alone and name Kaylee King      Prior Functioning/Environment          Comments: unknown        OT Problem List: Decreased strength;Decreased activity tolerance;Impaired balance (sitting and/or standing);Decreased knowledge of precautions;Decreased knowledge of use of DME or AE;Decreased cognition;Decreased safety awareness;Impaired sensation;Cardiopulmonary status limiting activity;Obesity;Impaired UE functional use;Decreased coordination      OT Treatment/Interventions:  Self-care/ADL training;Therapeutic exercise;Neuromuscular education;Energy conservation;DME and/or AE instruction;Manual therapy;Modalities;Splinting;Therapeutic activities;Cognitive remediation/compensation;Visual/perceptual remediation/compensation;Patient/family education;Balance training    OT Goals(Current goals can be found in the care plan section) Acute Rehab OT Goals Patient Stated Goal: none stated OT Goal Formulation: Patient unable to participate in goal setting Potential to Achieve Goals: Fair  OT Frequency: Min 2X/week   Barriers to D/C:            Co-evaluation PT/OT/SLP Co-Evaluation/Treatment: Yes Reason for Co-Treatment: Complexity of the patient's impairments (multi-system involvement);Necessary to address cognition/behavior during functional activity;For patient/therapist safety;To address functional/ADL transfers PT goals addressed during session: Mobility/safety with mobility;Balance OT goals addressed during session: ADL's and self-care;Proper use of Adaptive equipment and DME;Strengthening/ROM      AM-PAC OT "6 Clicks" Daily Activity     Outcome Measure Help from another person eating meals?: Total Help from another person taking care of personal grooming?: Total Help from another person toileting, which includes using toliet, bedpan, or urinal?: Total Help from another person bathing (including washing, rinsing, drying)?: Total Help from another person to put on and taking off regular upper body clothing?: Total Help from another person to put on and taking off regular lower body clothing?: Total 6 Click Score: 6   End of Session Equipment Utilized During Treatment: Oxygen Nurse Communication: Mobility status;Precautions  Activity Tolerance: Patient tolerated treatment well Patient left: in bed;with call bell/phone within reach;with bed alarm set  OT Visit Diagnosis: Unsteadiness on feet (R26.81);Muscle weakness (generalized) (  M62.81)                 Time: 2229-7989 OT Time Calculation (min): 32 min Charges:  OT General Charges $OT Visit: 1 Visit OT Evaluation $OT Eval High Complexity: 1 High   Brynn, OTR/L  Acute Rehabilitation Services Pager: [phone] Office: [phone] .   Mateo Flow 12/01/2020, 4:30 PM

## 2020-12-01 NOTE — Progress Notes (Addendum)
CRITICAL VALUE ALERT  Critical Value:  Potassium > 7.5  Date & Time Notied:  12/01/2020 0716  Provider Notified: Day shift RN previously reported to. Day shift RN notified. Day shift RN redrawing lab.  Orders Received/Actions taken: Day shift RN notified. Day shift RN redrawing lab.

## 2020-12-02 DIAGNOSIS — G9340 Encephalopathy, unspecified: Secondary | ICD-10-CM | POA: Diagnosis not present

## 2020-12-02 DIAGNOSIS — Z7189 Other specified counseling: Secondary | ICD-10-CM

## 2020-12-02 DIAGNOSIS — Z515 Encounter for palliative care: Secondary | ICD-10-CM | POA: Diagnosis not present

## 2020-12-02 DIAGNOSIS — G9341 Metabolic encephalopathy: Secondary | ICD-10-CM | POA: Diagnosis not present

## 2020-12-02 DIAGNOSIS — K922 Gastrointestinal hemorrhage, unspecified: Secondary | ICD-10-CM | POA: Diagnosis not present

## 2020-12-02 LAB — CBC
HCT: 25.4 % — ABNORMAL LOW (ref 36.0–46.0)
Hemoglobin: 8 g/dL — ABNORMAL LOW (ref 12.0–15.0)
MCH: 29.9 pg (ref 26.0–34.0)
MCHC: 31.5 g/dL (ref 30.0–36.0)
MCV: 94.8 fL (ref 80.0–100.0)
Platelets: 120 10*3/uL — ABNORMAL LOW (ref 150–400)
RBC: 2.68 MIL/uL — ABNORMAL LOW (ref 3.87–5.11)
RDW: 17.4 % — ABNORMAL HIGH (ref 11.5–15.5)
WBC: 8.3 10*3/uL (ref 4.0–10.5)
nRBC: 1.1 % — ABNORMAL HIGH (ref 0.0–0.2)

## 2020-12-02 LAB — COMPREHENSIVE METABOLIC PANEL
ALT: 48 U/L — ABNORMAL HIGH (ref 0–44)
AST: 106 U/L — ABNORMAL HIGH (ref 15–41)
Albumin: 2.7 g/dL — ABNORMAL LOW (ref 3.5–5.0)
Alkaline Phosphatase: 143 U/L — ABNORMAL HIGH (ref 38–126)
Anion gap: 10 (ref 5–15)
BUN: 20 mg/dL (ref 8–23)
CO2: 27 mmol/L (ref 22–32)
Calcium: 8.7 mg/dL — ABNORMAL LOW (ref 8.9–10.3)
Chloride: 104 mmol/L (ref 98–111)
Creatinine, Ser: 0.83 mg/dL (ref 0.44–1.00)
GFR, Estimated: >60 mL/min (ref >60)
Glucose, Bld: 161 mg/dL — ABNORMAL HIGH (ref 70–99)
Potassium: 3.9 mmol/L (ref 3.5–5.1)
Sodium: 141 mmol/L (ref 135–145)
Total Bilirubin: 1.2 mg/dL (ref 0.3–1.2)
Total Protein: 6.2 g/dL — ABNORMAL LOW (ref 6.5–8.1)

## 2020-12-02 LAB — MAGNESIUM: Magnesium: 1.9 mg/dL (ref 1.7–2.4)

## 2020-12-02 LAB — GLUCOSE, CAPILLARY
Glucose-Capillary: 115 mg/dL — ABNORMAL HIGH (ref 70–99)
Glucose-Capillary: 125 mg/dL — ABNORMAL HIGH (ref 70–99)
Glucose-Capillary: 128 mg/dL — ABNORMAL HIGH (ref 70–99)
Glucose-Capillary: 134 mg/dL — ABNORMAL HIGH (ref 70–99)
Glucose-Capillary: 156 mg/dL — ABNORMAL HIGH (ref 70–99)
Glucose-Capillary: 169 mg/dL — ABNORMAL HIGH (ref 70–99)

## 2020-12-02 LAB — PHOSPHORUS: Phosphorus: 2.7 mg/dL (ref 2.5–4.6)

## 2020-12-02 NOTE — Progress Notes (Signed)
PROGRESS NOTE    Kaylee King  NWG:[id number] DOB: [birth year] DOA: 11/23/2020 PCP: Fleet Contras, MD Status is: Inpatient  Remains inpatient appropriate because:IV treatments appropriate due to intensity of illness or inability to take PO   Dispo:  Patient From: Home  Planned Disposition: To be determined  Expected discharge date: 12/07/2020  Medically stable for discharge: No    Brief Narrative:  80 year old woman with history significant for alcohol use disorder, hypothyroidism,  NASH cirrhosis who presented with encephalopathy and upper GI bleed, found to have variceal bleeding on admission.  She was initially cared for in the intensive care unit from January 20-28, requiring intubation and transfusion of 6 units of RBCs and 2 units of fresh frozen plasma.  She is now extubated and then transferred to [facility] for ongoing care.  Current primary problems include ongoing encephalopathy thought to be due to hepatic disease and difficulty feeding.   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 Pylori (equivocal testing)--will treat with Pylera when improved clinically  - Continue PPI therapy  - CBC today   Acute hypoxic respiratory failure previously requiring mechanical ventilation extubated January 27 now on nasal cannula.  She has been treated for pneumonia for 7  days. -Wean oxygen as able consider IV diuresis if not improving  Hypothyroidism - Myexedema may be contributing to above?  - Continue levothyroxine   Hypertension - No home medications, consider amlodipine therapy pending clinical status   Feeding difficulties - RD consulted, tube feeds through Cortrak, SLP following - Mental status limiting PO intake  UTI- treated with ceftriaxone   AKI improving, BMP ordered.   Goals of Care- Palliative care consulted 1/29, appreciate care and consultation, continue full care, would limit intubation duration   DVT prophylaxis: SCD given GI bleed   Code Status: FULL Family Communication: None  Disposition Plan: Pending clinical improvement, taking PO, mental status     Consultants:   Gastroenterology signed off  Nutrition  Procedures:   1/20 intubation  1/27 extubation  1/20 upper endoscopy with gastroenterology  1/24 core track placement for nutrition   Antimicrobials:  - Ceftriaxone 1/21-1/27    Subjective:  Patient unable to voice concerns this morning she is alert but cannot state her name and seems confused.  She is able to raise both upper extremities and lower extremities with significant encouragement.  Objective: Vitals:   12/02/20 0200 12/02/20 0300 12/02/20 0400 12/02/20 0500  BP: (!) 142/68 (!) 144/83 (!) 151/65 135/67  Pulse: 87 95 86 86  Resp: (!) 40 (!) 25 (!) 21 (!) 23  Temp:   98.7 F (37.1 C)   TempSrc:   Oral   SpO2: 100% 100% 100% 100%  Weight:    82.5 kg  Height:        Intake/Output Summary (Last 24 hours) at 12/02/2020 0644 Last data filed at 12/02/2020 0600 Gross per 24 hour  Intake 2220  ml  Output 2325 ml  Net -105 ml   Filed Weights   11/30/20 0500 12/01/20 0500 12/02/20 0500  Weight: 84.2 kg 77.6 kg 82.5 kg    Examination:  Chronically ill-appearing woman in no distress laying in bed flat she wakens to voice but responds appropriately to almost all questions.  NG tube in place.   Oxygen in place as well Cardiac: Regular rate and rhythm. Normal S1/S2. No murmurs, rubs, or gallops appreciated. Lungs: Coarse bilateral breath sounds diminished at the base Abdomen: Some grimacing when palpating abdomen endorses mild abdominal pain  Data Reviewed: I have personally reviewed following labs and imaging studies  CBC: Recent Labs  Lab 11/28/20 1543 11/29/20 0613 11/29/20 1645 11/30/20 0500 12/01/20 0536  WBC 12.0* 10.8* 11.0* 10.6* 8.9  HGB 7.6* 7.3* 7.3* 7.3* 7.7*  HCT 24.6* 23.3* 23.2* 24.1* 24.1*  MCV 93.9 92.5 92.4 93.8 94.1  PLT 133* 111* 117* 129* 119*   Basic Metabolic Panel: Recent Labs  Lab 11/27/20 1507 11/28/20 0530 11/28/20 1111 11/29/20 0613 11/30/20 0500 12/01/20 0536 12/01/20 0731  NA  --  147* 147* 145 143 138 140  K  --  2.8* 3.4* 3.1* 3.3* >7.5* 3.9  CL  --  107 107 107 104 106 101  CO2  --  27 27 27 29 28 28   GLUCOSE  --  126* 127* 151* 142* 102* 105*  BUN  --  29* 29* 23 20 18 17   CREATININE  --  1.53* 1.48* 1.09* 0.89 0.93 0.95  CALCIUM  --  8.4* 8.4* 8.1* 8.3* 8.4* 8.9  MG 2.0 2.0  --  1.9 2.1 1.9  --   PHOS 2.1* 2.0*  --  2.1* 2.6 2.6  --    GFR: Estimated Creatinine Clearance: 48.2 mL/min (by C-G formula based on SCr of 0.95 mg/dL). Liver Function Tests: Recent Labs  Lab 11/27/20 0523 11/28/20 0530 11/29/20 0613  AST 40 44* 57*  ALT 16 15 19   ALKPHOS 97 81 86  BILITOT 1.0 0.9 0.9  PROT 5.4* 6.1* 5.4*  ALBUMIN 2.1* 3.0* 2.9*   No results for input(s): LIPASE, AMYLASE in the last 168 hours. Recent Labs  Lab 11/25/20 1751 11/29/20 0613  AMMONIA 29 28   Coagulation Profile: No results for input(s): INR, PROTIME in the last 168 hours. Cardiac Enzymes: No results for input(s): CKTOTAL, CKMB, CKMBINDEX, TROPONINI in the last 168 hours. BNP (last 3 results) No results for input(s): PROBNP in the last 8760 hours. HbA1C: No results for input(s): HGBA1C in the last 72 hours. CBG: Recent Labs  Lab 12/01/20 1208  12/01/20 1644 12/01/20 1937 12/01/20 2329 12/02/20 0344  GLUCAP 139* 154* 161* 114* 169*   Lipid Profile: No results for input(s): CHOL, HDL, LDLCALC, TRIG, CHOLHDL, LDLDIRECT in the last 72 hours. Thyroid Function Tests: Recent Labs    11/30/20 0941  TSH 22.682*   Anemia Panel: No results for input(s): VITAMINB12, FOLATE, FERRITIN, TIBC, IRON, RETICCTPCT in the last 72 hours. Sepsis Labs: No results for input(s): PROCALCITON, LATICACIDVEN in the last 168 hours.  Recent Results (from the past 240 hour(s))  SARS Coronavirus 2 by RT PCR (hospital order, performed in [facility] hospital lab) Nasopharyngeal Nasopharyngeal Swab     Status: None   Collection Time: 11/23/20 11:00 AM   Specimen: Nasopharyngeal Swab  Result Value Ref Range Status   SARS Coronavirus 2 NEGATIVE NEGATIVE Final    Comment: (NOTE) SARS-CoV-2 target nucleic acids are NOT DETECTED.  The SARS-CoV-2 RNA  is generally detectable in upper and lower respiratory specimens during the acute phase of infection. The lowest concentration of SARS-CoV-2 viral copies this assay can detect is 250 copies / mL. A negative result does not preclude SARS-CoV-2 infection and should not be used as the sole basis for treatment or other patient management decisions.  A negative result may occur with improper specimen collection / handling, submission of specimen other than nasopharyngeal swab, presence of viral mutation(s) within the areas targeted by this assay, and inadequate number of viral copies (<250 copies / mL). A negative result must be combined with clinical observations, patient history, and epidemiological information.  Fact Sheet for Patients:   BoilerBrush.com.cy  Fact Sheet for Healthcare Providers: https://pope.com/  This test is not yet approved or  cleared by the [place] FDA and has been authorized for detection and/or diagnosis of SARS-CoV-2 by FDA  under an Emergency Use Authorization (EUA).  This EUA will remain in effect (meaning this test can be used) for the duration of the COVID-19 declaration under Section 564(b)(1) of the Act, 21 U.S.C. section 360bbb-3(b)(1), unless the authorization is terminated or revoked sooner.  Performed at [facility], [address], [place], [place] [address]   Urine culture     Status: Abnormal   Collection Time: 11/23/20 10:04 PM   Specimen: Urine, Clean Catch  Result Value Ref Range Status   Specimen Description URINE, CLEAN CATCH  Final   Special Requests   Final    NONE Performed at [facility], [address], [place], [place] [address]    Culture (A)  Final    >=100,000 COLONIES/mL ESCHERICHIA COLI >=100,000 COLONIES/mL MORGANELLA MORGANII    Report Status 11/27/2020 FINAL  Final   Organism ID, Bacteria ESCHERICHIA COLI (A)  Final   Organism ID, Bacteria MORGANELLA MORGANII (A)  Final      Susceptibility   Escherichia coli - MIC*    AMPICILLIN <=2 SENSITIVE Sensitive     CEFAZOLIN <=4 SENSITIVE Sensitive     CEFEPIME <=0.12 SENSITIVE Sensitive     CEFTRIAXONE <=0.25 SENSITIVE Sensitive     CIPROFLOXACIN <=0.25 SENSITIVE Sensitive     GENTAMICIN 4 SENSITIVE Sensitive     IMIPENEM <=0.25 SENSITIVE Sensitive     NITROFURANTOIN 32 SENSITIVE Sensitive     TRIMETH/SULFA >=320 RESISTANT Resistant     AMPICILLIN/SULBACTAM <=2 SENSITIVE Sensitive     PIP/TAZO <=4 SENSITIVE Sensitive     * >=100,000 COLONIES/mL ESCHERICHIA COLI   Morganella morganii - MIC*    AMPICILLIN >=32 RESISTANT Resistant     CEFAZOLIN >=64 RESISTANT Resistant     CIPROFLOXACIN <=0.25 SENSITIVE Sensitive     GENTAMICIN <=1 SENSITIVE Sensitive     IMIPENEM 2 SENSITIVE Sensitive     NITROFURANTOIN 128 RESISTANT Resistant     TRIMETH/SULFA <=20 SENSITIVE Sensitive     AMPICILLIN/SULBACTAM 16 INTERMEDIATE Intermediate     PIP/TAZO <=4 SENSITIVE Sensitive     * >=100,000 COLONIES/mL MORGANELLA  MORGANII  MRSA PCR Screening     Status: None   Collection Time: 11/23/20 10:11 PM   Specimen: Nasal Mucosa; Nasopharyngeal  Result Value Ref Range Status   MRSA by PCR NEGATIVE NEGATIVE Final    Comment:        The GeneXpert MRSA Assay (FDA approved for NASAL specimens only), is one component of a comprehensive MRSA colonization surveillance program. It is not intended to diagnose MRSA infection nor to guide or monitor treatment for MRSA infections. Performed  at [facility], [address] [place], [place] [address]          Radiology Studies: No results found.      Scheduled Meds: . chlorhexidine  15 mL Mouth Rinse BID  . Chlorhexidine Gluconate Cloth  6 each Topical Daily  . feeding supplement (PROSource TF)  45 mL Per Tube Daily  . free water  200 mL Per Tube Q4H  . influenza vaccine adjuvanted  0.5 mL Intramuscular Tomorrow-1000  . insulin aspart  0-24 Units Subcutaneous Q4H  . lactulose  30 g Per Tube TID  . levothyroxine  100 mcg Per Tube Q0600  . mouth rinse  15 mL Mouth Rinse q12n4p  . pantoprazole sodium  40 mg Per Tube BID  . rifaximin  550 mg Per Tube BID   Continuous Infusions: . sodium chloride Stopped (11/30/20 1413)  . feeding supplement (OSMOLITE 1.5 CAL) 1,000 mL (12/01/20 2232)     LOS: 9 days    Time spent: >35 minutes     Terisa Starr, MD   [facility] Pager [phone]  If 7PM-7AM, please contact night-coverage [email or web address] Password [facility] 12/02/2020, 6:44 AM

## 2020-12-02 NOTE — Consult Note (Addendum)
Consultation Note Date: 12/02/2020   Patient Name: Kaylee King  DOB: [birth year]  MRN: [id number]  Age / Sex: 80 y.o., female  PCP: Fleet Contras, MD Referring Physician: Westley Chandler, MD  Reason for Consultation: Establishing goals of care  HPI/Patient Profile: 80 y.o. female  with past medical history of hypothyroidism, NASH cirrhosis, and history of declining medications and provider visits admitted on 11/23/2020 with headaches, fatigue, confusion. In ED, had coffee ground/maroon colored hematemesis. Was intubated to protect airway. Found to have upper GI bleed from esophageal varicies and cirrhosis. Had banding of large varicies. Also found large, non bleeding gastric ulcer. Requiring multiple blood product transfusions. Extubated 1/27. TSH was 51 on admission, down to 22 now. Mental status improving though she does remain lethargic and confused. PMT consulted to discuss GOC.  Clinical Assessment and Goals of Care: I have reviewed medical records including EPIC notes, labs and imaging, received report from RN, assessed the patient and then spoke with patient's daughter Rinaldo Cloud  to discuss diagnosis prognosis, GOC, EOL wishes, disposition and options.  I attempted conversation with patient - she is unable to discuss goals of care. She does follow commands and converse with me. She knows her name. She was surprised when I told her she was in the hospital and had been sick.   When speaking with Rinaldo Cloud, I introduced Palliative Medicine as specialized medical care for people living with serious illness. It focuses on providing relief from the symptoms and stress of a serious illness. The goal is to improve quality of life for both the patient and the family.  The patient lives with Rinaldo Cloud. As far as functional and nutritional status, Rinaldo Cloud shares prior to hospitalization the patient was walking and caring for herself. She tells me she ate 2-3 small  meals a day, she became full quickly. She tells me of some confusion at home. She tells me about a significant hospitalization in 2017 - this is when patient was diagnosed with liver disease.     We discussed patient's current illness and what it means in the larger context of patient's on-going co-morbidities.  Natural disease trajectory and expectations at EOL were discussed. I attempted to elicit values and goals of care important to the patient. I also discussed code status with Rinaldo Cloud. Rinaldo Cloud expressed interest in full code interventions, does tell me she would not want prolonged mechanical ventilation. Rinaldo Cloud seemed somewhat overwhelmed by the above conversation and a bit hesitant to discuss. I explained the patient was stable but the need to discuss her goals as she does have significant disease. Rinaldo Cloud shares her hope that EOL decisions do not need to be discussed for a "long, long time".  Rinaldo Cloud shares the patient does not like to go to doctor's office and she does not know if she will be able to convince her to go following hospitalization. She is agreeable to rehab and agreeable palliative follow up outpatient.   Discussed with Rinaldo Cloud the importance of continued conversation with family and the medical providers regarding overall plan of care and treatment options, ensuring decisions are within the context of the patient's values and GOCs.    Questions and concerns were addressed. The family was encouraged to call with questions or concerns.   Primary Decision Maker NEXT OF KIN for now, hopeful mental status improves and patient is able to participate in discussion   SUMMARY OF RECOMMENDATIONS   - full code, full scope - would not want prolonged mechanical ventilation - agreed to palliative  outpatient - referral given to [facility] - will follow, hopeful to have discuss with patient as mental status improves regarding goals of care, potentially complete MOST  Code Status/Advance Care  Planning:  Full code  Prognosis:   Unable to determine  Discharge Planning: Skilled Nursing Facility for rehab with Palliative care service follow-up      Primary Diagnoses: Present on Admission: . Acute metabolic encephalopathy   I have reviewed the medical record, interviewed the patient and family, and examined the patient. The following aspects are pertinent.  Past Medical History:  Diagnosis Date  . Cirrhosis (HCC)   . Thyroid disease    hypothyroid    Social History   Socioeconomic History  . Marital status: Legally Separated    Spouse name: Not on file  . Number of children: Not on file  . Years of education: Not on file  . Highest education level: Not on file  Occupational History  . Not on file  Tobacco Use  . Smoking status: Former Smoker    Quit date: 12/31/1981    Years since quitting: 38.9  . Smokeless tobacco: Never Used  Substance and Sexual Activity  . Alcohol use: Never  . Drug use: Never  . Sexual activity: Not on file  Other Topics Concern  . Not on file  Social History Narrative  . Not on file   Social Determinants of Health   Financial Resource Strain: Not on file  Food Insecurity: Not on file  Transportation Needs: Not on file  Physical Activity: Not on file  Stress: Not on file  Social Connections: Not on file   History reviewed. No pertinent family history. Scheduled Meds: . chlorhexidine  15 mL Mouth Rinse BID  . Chlorhexidine Gluconate Cloth  6 each Topical Daily  . feeding supplement (PROSource TF)  45 mL Per Tube Daily  . free water  200 mL Per Tube Q4H  . influenza vaccine adjuvanted  0.5 mL Intramuscular Tomorrow-1000  . insulin aspart  0-24 Units Subcutaneous Q4H  . lactulose  30 g Per Tube TID  . levothyroxine  100 mcg Per Tube Q0600  . mouth rinse  15 mL Mouth Rinse q12n4p  . pantoprazole sodium  40 mg Per Tube BID  . rifaximin  550 mg Per Tube BID   Continuous Infusions: . sodium chloride Stopped (11/30/20 1413)   . feeding supplement (OSMOLITE 1.5 CAL) 1,000 mL (12/01/20 2232)   PRN Meds:.sodium chloride, docusate, haloperidol lactate, polyethylene glycol No Known Allergies Review of Systems  Unable to perform ROS: Mental status change    Physical Exam Constitutional:      General: She is not in acute distress.    Appearance: She is ill-appearing.     Comments: Lethargic, wakes easily to voice  Pulmonary:     Effort: Pulmonary effort is normal.  Abdominal:     Comments: Cortrak infusing  Skin:    General: Skin is warm and dry.  Neurological:     Mental Status: She is disoriented.     Vital Signs: BP (!) 175/70 (BP Location: Right Arm)   Pulse 90   Temp 98.7 F (37.1 C) (Axillary)   Resp 19   Ht 5\' 4"  (1.626 m)   Wt 82.5 kg   SpO2 97%   BMI 31.22 kg/m  Pain Scale: CPOT   Pain Score: 0-No pain   SpO2: SpO2: 97 % O2 Device:SpO2: 97 % O2 Flow Rate: .O2 Flow Rate (L/min): 2 L/min  IO: Intake/output summary:  Intake/Output Summary (Last 24 hours) at 12/02/2020 1239 Last data filed at 12/02/2020 0700 Gross per 24 hour  Intake 1900 ml  Output 2325 ml  Net -425 ml    LBM: Last BM Date: 12/02/20 Baseline Weight: Weight: 92.5 kg Most recent weight: Weight: 82.5 kg     Palliative Assessment/Data:PPS 30%   Time Total: 55 minutes Greater than 50%  of this time was spent counseling and coordinating care related to the above assessment and plan.  Gerlean Ren, DNP, AGNP-C Palliative Medicine Team [phone] Pager: [phone]

## 2020-12-02 NOTE — Progress Notes (Signed)
[facility])  Referral received for outpatient palliative care services at SNF once she has discharged.  [facility] will follow until placement has been determined and then begin services once she is at the SNF.  Thank you, Wallis Bamberg RN, BSN, CCRN [facility] Liaison

## 2020-12-03 ENCOUNTER — Encounter (HOSPITAL_COMMUNITY): Payer: Self-pay | Admitting: Internal Medicine

## 2020-12-03 DIAGNOSIS — Z7189 Other specified counseling: Secondary | ICD-10-CM | POA: Diagnosis not present

## 2020-12-03 DIAGNOSIS — G9341 Metabolic encephalopathy: Secondary | ICD-10-CM | POA: Diagnosis not present

## 2020-12-03 DIAGNOSIS — K922 Gastrointestinal hemorrhage, unspecified: Secondary | ICD-10-CM | POA: Diagnosis not present

## 2020-12-03 DIAGNOSIS — Z515 Encounter for palliative care: Secondary | ICD-10-CM | POA: Diagnosis not present

## 2020-12-03 DIAGNOSIS — J9601 Acute respiratory failure with hypoxia: Secondary | ICD-10-CM | POA: Diagnosis not present

## 2020-12-03 DIAGNOSIS — K7460 Unspecified cirrhosis of liver: Secondary | ICD-10-CM | POA: Diagnosis not present

## 2020-12-03 LAB — GLUCOSE, CAPILLARY
Glucose-Capillary: 112 mg/dL — ABNORMAL HIGH (ref 70–99)
Glucose-Capillary: 133 mg/dL — ABNORMAL HIGH (ref 70–99)
Glucose-Capillary: 136 mg/dL — ABNORMAL HIGH (ref 70–99)
Glucose-Capillary: 139 mg/dL — ABNORMAL HIGH (ref 70–99)
Glucose-Capillary: 145 mg/dL — ABNORMAL HIGH (ref 70–99)
Glucose-Capillary: 150 mg/dL — ABNORMAL HIGH (ref 70–99)

## 2020-12-03 LAB — BASIC METABOLIC PANEL
Anion gap: 10 (ref 5–15)
BUN: 18 mg/dL (ref 8–23)
CO2: 27 mmol/L (ref 22–32)
Calcium: 8.6 mg/dL — ABNORMAL LOW (ref 8.9–10.3)
Chloride: 103 mmol/L (ref 98–111)
Creatinine, Ser: 0.8 mg/dL (ref 0.44–1.00)
GFR, Estimated: >60 mL/min (ref >60)
Glucose, Bld: 155 mg/dL — ABNORMAL HIGH (ref 70–99)
Potassium: 3.7 mmol/L (ref 3.5–5.1)
Sodium: 140 mmol/L (ref 135–145)

## 2020-12-03 LAB — CBC
HCT: 25.8 % — ABNORMAL LOW (ref 36.0–46.0)
Hemoglobin: 7.7 g/dL — ABNORMAL LOW (ref 12.0–15.0)
MCH: 28.9 pg (ref 26.0–34.0)
MCHC: 29.8 g/dL — ABNORMAL LOW (ref 30.0–36.0)
MCV: 97 fL (ref 80.0–100.0)
Platelets: 126 10*3/uL — ABNORMAL LOW (ref 150–400)
RBC: 2.66 MIL/uL — ABNORMAL LOW (ref 3.87–5.11)
RDW: 17.2 % — ABNORMAL HIGH (ref 11.5–15.5)
WBC: 8.8 10*3/uL (ref 4.0–10.5)
nRBC: 1 % — ABNORMAL HIGH (ref 0.0–0.2)

## 2020-12-03 MED ORDER — AMLODIPINE BESYLATE 5 MG PO TABS
5.0000 mg | ORAL_TABLET | Freq: Every day | ORAL | Status: DC
  Administered 2020-12-03 – 2020-12-13 (×9): 5 mg via NASOGASTRIC
  Filled 2020-12-03 (×11): qty 1

## 2020-12-03 NOTE — Progress Notes (Signed)
Palliative:  HPI: 80 y.o. female  with past medical history of hypothyroidism, NASH cirrhosis, and history of declining medications and provider visits admitted on 11/23/2020 with headaches, fatigue, confusion. In ED, had coffee ground/maroon colored hematemesis. Was intubated to protect airway. Found to have upper GI bleed from esophageal varicies and cirrhosis. Had banding of large varicies. Also found large, non bleeding gastric ulcer. Requiring multiple blood product transfusions. Extubated 1/27. TSH was 51 on admission, down to 22 now. Mental status improving though she does remain lethargic and confused. PMT consulted to discuss [place].  I met today at Ms. Younce's bedside. No family at bedside. She is alert and is oriented to person and place. She has no recollection of her medical issues or liver disease. She continues to be overall confused but able to answer questions with clear voice. She also seems more alert than previously noted in chart. She denies pain or discomfort. Hopefully she will progress further with SLP and swallowing since her mentation seems improved today. I will continue to follow with hopes that Ms. Heier can participate in goals of care conversation herself at some point. Will touch base with daughter tomorrow.   Exam: Alert, oriented x 2. Confused and a little restless in bed. Cortrak in place. HR 80s. Breathing regular although a little labored at rest. Abd distended but soft. Moves all extremities.   Plan: - Ongoing goals of care conversations needed.   [place], NP Palliative Medicine Team Pager [phone] (Please see [email or web address] for schedule) Team Phone [phone]    Greater than 50%  of this time was spent counseling and coordinating care related to the above assessment and plan

## 2020-12-03 NOTE — Progress Notes (Signed)
Progress Note    Jelina Paulsen  WUJ:[id number] DOB: May 04, 1941  DOA: 11/23/2020 PCP: Fleet Contras, MD      Brief Narrative:    Medical records reviewed and are as summarized below:  Kaylee King is a 80 y.o. female with history significant for alcohol use disorder, hypothyroidism,  NASH cirrhosis who presented with encephalopathy and upper GI bleed, found to have variceal bleeding on admission.  She was initially cared for in the intensive care unit from January 20-28, requiring intubation and transfusion of 6 units of RBCs and 2 units of fresh frozen plasma.  She is now extubated and then transferred to [facility] for ongoing care.  Current primary problems include ongoing encephalopathy thought to be due to hepatic disease and difficulty feeding.       Assessment/Plan:   Active Problems:   Acute metabolic encephalopathy   UGI bleed   Pressure injury of skin   Respiratory failure (HCC)   Nutrition Problem: Inadequate oral intake Etiology: acute illness  Signs/Symptoms: NPO status   Body mass index is 31.22 kg/m.    Acute metabolic encephalopathy likely multifactorial but primarily due to hepatic encephalopathy -Continue lactulose and rifaximin.  Continue delirium precautions -Avoid medications which predispose to delirium - CT head on admission showed only white matter changes   Upper GI bleeding secondary to variceal bleed in the setting of NASH cirrhosis - GI evaluated, endoscopy 1/20 showed Grade III varices,  that were banded.,  Clotted blood in the gastric fundus, type I gastroesophageal varices, nonbleeding gastric ulcers x3 with no stigmata of bleeding.-  - SCDS only   -H. pylori IgG positive but H. pylori IgM negative. - Continue PPI therapy   Acute blood loss anemia S/p transfusion with 6 units of packed red blood cells. S/p 2 units of fresh frozen plasma for coagulopathy  Acute hypoxic respiratory failure previously requiring mechanical  ventilation extubated January 27 now on nasal cannula.  She has been treated for pneumonia for 7 days. -Taper off oxygen as able.  Hypothyroidism -Initial TSH on 11/23/2020 was 51.314.  Repeat TSH on 11/30/2020 was 22.62. Continue Synthroid  Hypertension -Start amlodipine  Feeding difficulties Continue enteral nutrition via nasogastric tube Altered mental status is limiting oral intake.  UTI- treated with ceftriaxone   AKI  Improved      Diet Order            Diet NPO time specified  Diet effective now                    Consultants:  Gastroenterologist  Palliative care  Procedures:  Central line placed 11/23/2020   EGD on 11/23/2020-  Grade III esophageal varices that were banded.,  Clotted blood in the gastric fundus, type I gastroesophageal varices, nonbleeding gastric ulcers x3 with no stigmata of bleeding.-   Medications:   . chlorhexidine  15 mL Mouth Rinse BID  . Chlorhexidine Gluconate Cloth  6 each Topical Daily  . feeding supplement (PROSource TF)  45 mL Per Tube Daily  . free water  200 mL Per Tube Q4H  . influenza vaccine adjuvanted  0.5 mL Intramuscular Tomorrow-1000  . insulin aspart  0-24 Units Subcutaneous Q4H  . lactulose  30 g Per Tube TID  . levothyroxine  100 mcg Per Tube Q0600  . mouth rinse  15 mL Mouth Rinse q12n4p  . pantoprazole sodium  40 mg Per Tube BID  . rifaximin  550 mg Per Tube BID  Continuous Infusions: . sodium chloride Stopped (11/30/20 1413)  . feeding supplement (OSMOLITE 1.5 CAL) 1,000 mL (12/01/20 2232)     Anti-infectives (From admission, onward)   Start     Dose/Rate Route Frequency Ordered Stop   12/01/20 1000  rifaximin (XIFAXAN) tablet 550 mg        550 mg Per Tube 2 times daily 12/01/20 0903     11/24/20 1200  cefTRIAXone (ROCEPHIN) 2 g in sodium chloride 0.9 % 100 mL IVPB        2 g 200 mL/hr over 30 Minutes Intravenous Every 24 hours 11/24/20 1047 11/30/20 1305   11/23/20 1730  cefTRIAXone  (ROCEPHIN) 2 g in sodium chloride 0.9 % 100 mL IVPB  Status:  Discontinued        2 g 200 mL/hr over 30 Minutes Intravenous Every 24 hours 11/23/20 1721 11/24/20 1047             Family Communication/Anticipated D/C date and plan/Code Status   DVT prophylaxis: SCDs Start: 11/23/20 1654     Code Status: Full Code  Family Communication: None Disposition Plan:    Status is: Inpatient  Remains inpatient appropriate because:Inpatient level of care appropriate due to severity of illness   Dispo:  Patient From: Home  Planned Disposition: To be determined  Expected discharge date: 12/07/2020  Medically stable for discharge: No             Subjective:   Interval events noted.  She is confused only with providing history.  Objective:    Vitals:   12/03/20 0700 12/03/20 0800 12/03/20 1200 12/03/20 1210  BP: (!) 160/69 (!) 164/117  (!) 157/72  Pulse: 93 93  88  Resp: (!) 22 (!) 24    Temp:  98.6 F (37 C) 98.8 F (37.1 C)   TempSrc:  Axillary Axillary   SpO2: 100% 97%  96%  Weight:      Height:       No data found.   Intake/Output Summary (Last 24 hours) at 12/03/2020 1355 Last data filed at 12/03/2020 1200 Gross per 24 hour  Intake 1620 ml  Output 1870 ml  Net -250 ml   Filed Weights   11/30/20 0500 12/01/20 0500 12/02/20 0500  Weight: 84.2 kg 77.6 kg 82.5 kg    Exam:  GEN: NAD SKIN: Warm and dry EYES: No pallor or icterus ENT: MMM CV: RRR PULM: CTA B ABD: soft, obese, NT, +BS CNS: Alert but confused, non focal EXT: No edema or tenderness   Data Reviewed:   I have personally reviewed following labs and imaging studies:  Labs: Labs show the following:   Basic Metabolic Panel: Recent Labs  Lab 11/28/20 0530 11/28/20 1111 11/29/20 0613 11/30/20 0500 12/01/20 0536 12/01/20 0731 12/02/20 0718 12/02/20 1604 12/03/20 0629  NA 147*   < > 145 143 138 140  --  141 140  K 2.8*   < > 3.1* 3.3* >7.5* 3.9  --  3.9 3.7  CL 107   < >  107 104 106 101  --  104 103  CO2 27   < > 27 29 28 28   --  27 27  GLUCOSE 126*   < > 151* 142* 102* 105*  --  161* 155*  BUN 29*   < > 23 20 18 17   --  20 18  CREATININE 1.53*   < > 1.09* 0.89 0.93 0.95  --  0.83 0.80  CALCIUM 8.4*   < >  8.1* 8.3* 8.4* 8.9  --  8.7* 8.6*  MG 2.0  --  1.9 2.1 1.9  --  1.9  --   --   PHOS 2.0*  --  2.1* 2.6 2.6  --  2.7  --   --    < > = values in this interval not displayed.   GFR Estimated Creatinine Clearance: 57.3 mL/min (by C-G formula based on SCr of 0.8 mg/dL). Liver Function Tests: Recent Labs  Lab 11/27/20 0523 11/28/20 0530 11/29/20 0613 12/02/20 1604  AST 40 44* 57* 106*  ALT 16 15 19  48*  ALKPHOS 97 81 86 143*  BILITOT 1.0 0.9 0.9 1.2  PROT 5.4* 6.1* 5.4* 6.2*  ALBUMIN 2.1* 3.0* 2.9* 2.7*   No results for input(s): LIPASE, AMYLASE in the last 168 hours. Recent Labs  Lab 11/29/20 0613  AMMONIA 28   Coagulation profile No results for input(s): INR, PROTIME in the last 168 hours.  CBC: Recent Labs  Lab 11/29/20 1645 11/30/20 0500 12/01/20 0536 12/02/20 1604 12/03/20 0629  WBC 11.0* 10.6* 8.9 8.3 8.8  HGB 7.3* 7.3* 7.7* 8.0* 7.7*  HCT 23.2* 24.1* 24.1* 25.4* 25.8*  MCV 92.4 93.8 94.1 94.8 97.0  PLT 117* 129* 119* 120* 126*   Cardiac Enzymes: No results for input(s): CKTOTAL, CKMB, CKMBINDEX, TROPONINI in the last 168 hours. BNP (last 3 results) No results for input(s): PROBNP in the last 8760 hours. CBG: Recent Labs  Lab 12/02/20 1948 12/02/20 2337 12/03/20 0332 12/03/20 0820 12/03/20 1207  GLUCAP 125* 134* 139* 145* 112*   D-Dimer: No results for input(s): DDIMER in the last 72 hours. Hgb A1c: No results for input(s): HGBA1C in the last 72 hours. Lipid Profile: No results for input(s): CHOL, HDL, LDLCALC, TRIG, CHOLHDL, LDLDIRECT in the last 72 hours. Thyroid function studies: No results for input(s): TSH, T4TOTAL, T3FREE, THYROIDAB in the last 72 hours.  Invalid input(s): FREET3 Anemia work up: No  results for input(s): VITAMINB12, FOLATE, FERRITIN, TIBC, IRON, RETICCTPCT in the last 72 hours. Sepsis Labs: Recent Labs  Lab 11/30/20 0500 12/01/20 0536 12/02/20 1604 12/03/20 0629  WBC 10.6* 8.9 8.3 8.8    Microbiology Recent Results (from the past 240 hour(s))  Urine culture     Status: Abnormal   Collection Time: 11/23/20 10:04 PM   Specimen: Urine, Clean Catch  Result Value Ref Range Status   Specimen Description URINE, CLEAN CATCH  Final   Special Requests   Final    NONE Performed at [facility], [address], [place], [place]    Culture (A)  Final    >=100,000 COLONIES/mL ESCHERICHIA COLI >=100,000 COLONIES/mL MORGANELLA MORGANII    Report Status 11/27/2020 FINAL  Final   Organism ID, Bacteria ESCHERICHIA COLI (A)  Final   Organism ID, Bacteria MORGANELLA MORGANII (A)  Final      Susceptibility   Escherichia coli - MIC*    AMPICILLIN <=2 SENSITIVE Sensitive     CEFAZOLIN <=4 SENSITIVE Sensitive     CEFEPIME <=0.12 SENSITIVE Sensitive     CEFTRIAXONE <=0.25 SENSITIVE Sensitive     CIPROFLOXACIN <=0.25 SENSITIVE Sensitive     GENTAMICIN 4 SENSITIVE Sensitive     IMIPENEM <=0.25 SENSITIVE Sensitive     NITROFURANTOIN 32 SENSITIVE Sensitive     TRIMETH/SULFA >=320 RESISTANT Resistant     AMPICILLIN/SULBACTAM <=2 SENSITIVE Sensitive     PIP/TAZO <=4 SENSITIVE Sensitive     * >=100,000 COLONIES/mL ESCHERICHIA COLI   Morganella morganii -  MIC*    AMPICILLIN >=32 RESISTANT Resistant     CEFAZOLIN >=64 RESISTANT Resistant     CIPROFLOXACIN <=0.25 SENSITIVE Sensitive     GENTAMICIN <=1 SENSITIVE Sensitive     IMIPENEM 2 SENSITIVE Sensitive     NITROFURANTOIN 128 RESISTANT Resistant     TRIMETH/SULFA <=20 SENSITIVE Sensitive     AMPICILLIN/SULBACTAM 16 INTERMEDIATE Intermediate     PIP/TAZO <=4 SENSITIVE Sensitive     * >=100,000 COLONIES/mL MORGANELLA MORGANII  MRSA PCR Screening     Status: None   Collection Time: 11/23/20 10:11 PM    Specimen: Nasal Mucosa; Nasopharyngeal  Result Value Ref Range Status   MRSA by PCR NEGATIVE NEGATIVE Final    Comment:        The GeneXpert MRSA Assay (FDA approved for NASAL specimens only), is one component of a comprehensive MRSA colonization surveillance program. It is not intended to diagnose MRSA infection nor to guide or monitor treatment for MRSA infections. Performed at [facility], [address], [place], [place] [address]     Procedures and diagnostic studies:  No results found.             LOS: 10 days   Tjuana Vickrey  [facility]   Pager on [email or web address]. If 7PM-7AM, please contact night-coverage at [email or web address]     12/03/2020, 1:55 PM

## 2020-12-04 DIAGNOSIS — Z515 Encounter for palliative care: Secondary | ICD-10-CM | POA: Diagnosis not present

## 2020-12-04 DIAGNOSIS — G9341 Metabolic encephalopathy: Secondary | ICD-10-CM | POA: Diagnosis not present

## 2020-12-04 DIAGNOSIS — K7460 Unspecified cirrhosis of liver: Secondary | ICD-10-CM | POA: Diagnosis not present

## 2020-12-04 DIAGNOSIS — K7581 Nonalcoholic steatohepatitis (NASH): Secondary | ICD-10-CM | POA: Diagnosis not present

## 2020-12-04 DIAGNOSIS — K922 Gastrointestinal hemorrhage, unspecified: Secondary | ICD-10-CM | POA: Diagnosis not present

## 2020-12-04 DIAGNOSIS — Z7189 Other specified counseling: Secondary | ICD-10-CM | POA: Diagnosis not present

## 2020-12-04 LAB — GLUCOSE, CAPILLARY
Glucose-Capillary: 134 mg/dL — ABNORMAL HIGH (ref 70–99)
Glucose-Capillary: 126 mg/dL — ABNORMAL HIGH (ref 70–99)
Glucose-Capillary: 146 mg/dL — ABNORMAL HIGH (ref 70–99)
Glucose-Capillary: 132 mg/dL — ABNORMAL HIGH (ref 70–99)

## 2020-12-04 NOTE — Progress Notes (Signed)
Progress Note    Kaylee King  YBW:[id number] DOB: 1941-06-09  DOA: 11/23/2020 PCP: Fleet Contras, MD      Brief Narrative:    Medical records reviewed and are as summarized below:  Kaylee King is a 80 y.o. female with history significant for alcohol use disorder, hypothyroidism,  NASH cirrhosis who presented with encephalopathy and upper GI bleed, found to have variceal bleeding on admission.  She was initially cared for in the intensive care unit from January 20-28, requiring intubation and transfusion of 6 units of RBCs and 2 units of fresh frozen plasma.  She is now extubated and then transferred to [facility] for ongoing care.  Current primary problems include ongoing encephalopathy thought to be due to hepatic disease and difficulty feeding.       Assessment/Plan:   Active Problems:   Acute metabolic encephalopathy   UGI bleed   Pressure injury of skin   Respiratory failure (HCC)   Nutrition Problem: Inadequate oral intake Etiology: acute illness  Signs/Symptoms: NPO status   Body mass index is 31.22 kg/m.  (Obesity)    Acute metabolic encephalopathy likely multifactorial but primarily due to hepatic encephalopathy Continue lactulose and rifaximin.  - CT head on admission showed only white matter changes   Upper GI bleeding secondary to variceal bleed in the setting of NASH cirrhosis - GI evaluated, endoscopy 1/20 showed Grade III varices,  that were banded.,  Clotted blood in the gastric fundus, type I gastroesophageal varices, nonbleeding gastric ulcers x3 with no stigmata of bleeding.-  - SCDS only   -H. pylori IgG positive but H. pylori IgM negative. - Continue PPI therapy   Acute blood loss anemia S/p transfusion with 6 units of packed red blood cells. S/p 2 units of fresh frozen plasma for coagulopathy  Acute hypoxic respiratory failure previously requiring mechanical ventilation extubated January 27 now on nasal cannula.  She has been  treated for pneumonia for 7 days. -Taper off oxygen as able.  Hypothyroidism -Initial TSH on 11/23/2020 was 51.314.  Repeat TSH on 11/30/2020 was 22.62. Continue Synthroid  Hypertension Continue amlodipine  Feeding difficulties Continue enteral nutrition via nasogastric tube Altered mental status is limiting oral intake. She was reevaluated by speech therapist recommended continued n.p.o. status. Follow-up with speech therapist for further recommendations.  UTI- treated with ceftriaxone   AKI  Improved   Pressure Injury 11/24/20 Sacrum Medial Stage 2 -  Partial thickness loss of dermis presenting as a shallow open injury with a red, pink wound bed without slough. non-blanchable red spot on boney area on sacurm with open skin. (Active)  11/24/20 1900  Location: Sacrum  Location Orientation: Medial  Staging: Stage 2 -  Partial thickness loss of dermis presenting as a shallow open injury with a red, pink wound bed without slough.  Wound Description (Comments): non-blanchable red spot on boney area on sacurm with open skin.  Present on Admission: No     Pressure Injury 11/29/20 Elbow Left;Posterior Stage 2 -  Partial thickness loss of dermis presenting as a shallow open injury with a red, pink wound bed without slough. (Active)  11/29/20 1000  Location: Elbow  Location Orientation: Left;Posterior  Staging: Stage 2 -  Partial thickness loss of dermis presenting as a shallow open injury with a red, pink wound bed without slough.  Wound Description (Comments):   Present on Admission:         Diet Order  Diet NPO time specified  Diet effective now                    Consultants:  Gastroenterologist  Palliative care  Procedures:  Central line placed 11/23/2020   EGD on 11/23/2020-  Grade III esophageal varices that were banded.,  Clotted blood in the gastric fundus, type I gastroesophageal varices, nonbleeding gastric ulcers x3 with no stigmata of  bleeding.-   Medications:   . amLODipine  5 mg Per NG tube Daily  . chlorhexidine  15 mL Mouth Rinse BID  . Chlorhexidine Gluconate Cloth  6 each Topical Daily  . feeding supplement (PROSource TF)  45 mL Per Tube Daily  . free water  200 mL Per Tube Q4H  . influenza vaccine adjuvanted  0.5 mL Intramuscular Tomorrow-1000  . insulin aspart  0-24 Units Subcutaneous Q4H  . lactulose  30 g Per Tube TID  . levothyroxine  100 mcg Per Tube Q0600  . mouth rinse  15 mL Mouth Rinse q12n4p  . pantoprazole sodium  40 mg Per Tube BID  . rifaximin  550 mg Per Tube BID   Continuous Infusions: . sodium chloride Stopped (11/30/20 1413)  . feeding supplement (OSMOLITE 1.5 CAL) 1,000 mL (12/01/20 2232)     Anti-infectives (From admission, onward)   Start     Dose/Rate Route Frequency Ordered Stop   12/01/20 1000  rifaximin (XIFAXAN) tablet 550 mg        550 mg Per Tube 2 times daily 12/01/20 0903     11/24/20 1200  cefTRIAXone (ROCEPHIN) 2 g in sodium chloride 0.9 % 100 mL IVPB        2 g 200 mL/hr over 30 Minutes Intravenous Every 24 hours 11/24/20 1047 11/30/20 1305   11/23/20 1730  cefTRIAXone (ROCEPHIN) 2 g in sodium chloride 0.9 % 100 mL IVPB  Status:  Discontinued        2 g 200 mL/hr over 30 Minutes Intravenous Every 24 hours 11/23/20 1721 11/24/20 1047             Family Communication/Anticipated D/C date and plan/Code Status   DVT prophylaxis: SCDs Start: 11/23/20 1654     Code Status: Full Code  Family Communication: None Disposition Plan:    Status is: Inpatient  Remains inpatient appropriate because:Inpatient level of care appropriate due to severity of illness   Dispo:  Patient From: Home  Planned Disposition: Skilled Nursing Facility  Expected discharge date: 12/07/2020  Medically stable for discharge: No             Subjective:   Interval events noted.  She is confused unable to provide any history.  Objective:    Vitals:   12/03/20 2310  12/04/20 0310 12/04/20 0700 12/04/20 1200  BP: (!) 144/60 (!) 152/69 (!) 157/72 (!) 156/62  Pulse: 86 86    Resp: (!) 26 (!) 23  20  Temp: 99 F (37.2 C) 98.9 F (37.2 C) 98.4 F (36.9 C) 98.6 F (37 C)  TempSrc: Oral Oral Oral Oral  SpO2: 97% 100%  94%  Weight:      Height:       No data found.   Intake/Output Summary (Last 24 hours) at 12/04/2020 1408 Last data filed at 12/04/2020 0813 Gross per 24 hour  Intake 1700 ml  Output 1675 ml  Net 25 ml   Filed Weights   11/30/20 0500 12/01/20 0500 12/02/20 0500  Weight: 84.2 kg 77.6 kg 82.5 kg  Exam:  GEN: NAD SKIN: Warm and dry EYES: EOMI ENT: MMM, NG tube in place CV: RRR PULM: CTA B ABD: soft, obese, NT, +BS CNS: AAO x 2 (person and place), non focal EXT: No edema or tenderness     Data Reviewed:   I have personally reviewed following labs and imaging studies:  Labs: Labs show the following:   Basic Metabolic Panel: Recent Labs  Lab 11/28/20 0530 11/28/20 1111 11/29/20 0613 11/30/20 0500 12/01/20 0536 12/01/20 0731 12/02/20 0718 12/02/20 1604 12/03/20 0629  NA 147*   < > 145 143 138 140  --  141 140  K 2.8*   < > 3.1* 3.3* >7.5* 3.9  --  3.9 3.7  CL 107   < > 107 104 106 101  --  104 103  CO2 27   < > 27 29 28 28   --  27 27  GLUCOSE 126*   < > 151* 142* 102* 105*  --  161* 155*  BUN 29*   < > 23 20 18 17   --  20 18  CREATININE 1.53*   < > 1.09* 0.89 0.93 0.95  --  0.83 0.80  CALCIUM 8.4*   < > 8.1* 8.3* 8.4* 8.9  --  8.7* 8.6*  MG 2.0  --  1.9 2.1 1.9  --  1.9  --   --   PHOS 2.0*  --  2.1* 2.6 2.6  --  2.7  --   --    < > = values in this interval not displayed.   GFR Estimated Creatinine Clearance: 57.3 mL/min (by C-G formula based on SCr of 0.8 mg/dL). Liver Function Tests: Recent Labs  Lab 11/28/20 0530 11/29/20 0613 12/02/20 1604  AST 44* 57* 106*  ALT 15 19 48*  ALKPHOS 81 86 143*  BILITOT 0.9 0.9 1.2  PROT 6.1* 5.4* 6.2*  ALBUMIN 3.0* 2.9* 2.7*   No results for input(s):  LIPASE, AMYLASE in the last 168 hours. Recent Labs  Lab 11/29/20 0613  AMMONIA 28   Coagulation profile No results for input(s): INR, PROTIME in the last 168 hours.  CBC: Recent Labs  Lab 11/29/20 1645 11/30/20 0500 12/01/20 0536 12/02/20 1604 12/03/20 0629  WBC 11.0* 10.6* 8.9 8.3 8.8  HGB 7.3* 7.3* 7.7* 8.0* 7.7*  HCT 23.2* 24.1* 24.1* 25.4* 25.8*  MCV 92.4 93.8 94.1 94.8 97.0  PLT 117* 129* 119* 120* 126*   Cardiac Enzymes: No results for input(s): CKTOTAL, CKMB, CKMBINDEX, TROPONINI in the last 168 hours. BNP (last 3 results) No results for input(s): PROBNP in the last 8760 hours. CBG: Recent Labs  Lab 12/03/20 1550 12/03/20 2001 12/03/20 2346 12/04/20 0356 12/04/20 0722  GLUCAP 150* 133* 136* 134* 126*   D-Dimer: No results for input(s): DDIMER in the last 72 hours. Hgb A1c: No results for input(s): HGBA1C in the last 72 hours. Lipid Profile: No results for input(s): CHOL, HDL, LDLCALC, TRIG, CHOLHDL, LDLDIRECT in the last 72 hours. Thyroid function studies: No results for input(s): TSH, T4TOTAL, T3FREE, THYROIDAB in the last 72 hours.  Invalid input(s): FREET3 Anemia work up: No results for input(s): VITAMINB12, FOLATE, FERRITIN, TIBC, IRON, RETICCTPCT in the last 72 hours. Sepsis Labs: Recent Labs  Lab 11/30/20 0500 12/01/20 0536 12/02/20 1604 12/03/20 0629  WBC 10.6* 8.9 8.3 8.8    Microbiology No results found for this or any previous visit (from the past 240 hour(s)).  Procedures and diagnostic studies:  No results found.  LOS: 11 days   Sakiya Stepka  [facility] loss adjuster on [email or web address]. If 7PM-7AM, please contact night-coverage at [email or web address]     12/04/2020, 2:08 PM

## 2020-12-04 NOTE — Progress Notes (Addendum)
Pt arrived to 4NP-7 via bed. Pt alert to self and place. VSS. IV intact. Flexiseal intact. Ecchymosis on upper and lower extremities. Prior pressure wounds noted on sacrum and L elbow. MASD noted on butttocks and perineum. No belongings at bedside. Bed alarm on, bed in lowest position and call bell in reach.

## 2020-12-04 NOTE — Plan of Care (Signed)

## 2020-12-04 NOTE — Progress Notes (Signed)
  Speech Language Pathology Treatment: Dysphagia  Patient Details Name: Kaylee King MRN: [id number] DOB: [birth year] Today's Date: 12/04/2020 Time: 1042-1100 SLP Time Calculation (min) (ACUTE ONLY): 18 min  Assessment / Plan / Recommendation Clinical Impression  Pt presents with multifactors preventing safe po intake including reduced awareness, alertness, respiratory status. Therapist removed lingual and soft palate debris (partially from tongue). Increased work of breathing following ice chip and teaspoon, cup sip water. Oral holding and suspected delayed swallow onset. Moderate facilitation provided throughout to follow strategies. Pt has Cortrak- will continue to follow and intervene.   HPI HPI: Pt is an 80 year old female with history of hypothyroidism noncompliant who presented with progressive headaches, fatigue, confusion, and hypotension of unclear etiology. In the ED, had coffee ground/maroon colored hematemesis and was intubated for airway protection. CXR: Interim placement of feeding tube, its tip is below left hemidiaphragm. CT head 1/20 was negative. Pt found to have upper GI bleed from esophageal varices and is followed by GI. EDG 1/20- Grade III esophageal varices, banded x6. Type I GE varices without bleeding. Nonbleeding gastric ulcers x3. ETT 1/20-1/27. Cortrak placed on 1/24.      SLP Plan  Continue with current plan of care       Recommendations  Diet recommendations: NPO;Other(comment) (oral care before ice chips) Medication Administration: Via alternative means                Oral Care Recommendations: Oral care QID;Oral care prior to ice chip/H20 Follow up Recommendations: Skilled Nursing facility SLP Visit Diagnosis: Dysphagia, unspecified (R13.10) Plan: Continue with current plan of care                       Royce Macadamia 12/04/2020, 11:17 AM  Breck Coons Lonell Face.Ed Nurse, children's [phone] Office  [phone]

## 2020-12-04 NOTE — Plan of Care (Signed)

## 2020-12-04 NOTE — Progress Notes (Signed)
Palliative:  HPI: 80 y.o.femalewith past medical history of hypothyroidism, NASH cirrhosis, and history of declining medications and provider visitsadmitted on 1/20/2022with headaches, fatigue, confusion.In ED,had coffee ground/maroon colored hematemesis. Was intubated to protect airway. Found to have upper GI bleed from esophageal varicies and cirrhosis. Had banding of large varicies. Also found large, non bleeding gastric ulcer. Requiring multiple blood product transfusions. Extubated 1/27. TSH was 51 on admission, down to 22 now. Mental status improving though she does remain lethargic and confused. PMT consulted to discuss [place].  I met today at Kaylee King's bedside but she is a little more lethargic and speech less clear than yesterday. She appears to be comfortable. Speech is mumbled and difficult to understand. She did not do well with follow up SLP evaluation.   I called and spoke with daughter, Kaylee King, and explained my assessment of her mother over the past couple days. We continued to talk about [name]'s illness and expectations and the fact that she continues to be very ill. I gently introduced Kaylee King to the idea that there is a chance that her mother does not improve much past her current state and if that is the case what would this mean for her quality of life and how we move forward. Kaylee King is not in a place to really truly consider that her mother's prognosis and quality of life may be poor. She is very hopeful that she will get back to how she was prior to admission even if she needs brief rehab. She was weak but able to get around the house and dress, shower, feed herself. We discussed that this is a journey and a process and we will continue to take one day at a time and see how her mother responds to our treatments and how well her body can improve. I did express to Kaylee King that I am worried that her mother may not be able to return to her current level of functioning.   All  questions/concerns addressed. Emotional support provided.   Exam: Lethargic. Garbled/mumbled speech. Cortrak in place. HR 80s. Breathing regular although a little labored at rest. Abd distended but soft. Moves all extremities.  Plan: - Recheck ammonia in am.  - Ongoing goals of care but likely not to see daily. Will continue to speak with daughter, Kaylee King.  Kaylee King struggles with her mothers decline and has high expectations for significant improvement.   25 min  Vinie Sill, NP Palliative Medicine Team Pager [phone] (Please see [email or web address] for schedule) Team Phone [phone]    Greater than 50%  of this time was spent counseling and coordinating care related to the above assessment and plan

## 2020-12-05 DIAGNOSIS — L89022 Pressure ulcer of left elbow, stage 2: Secondary | ICD-10-CM

## 2020-12-05 DIAGNOSIS — G9341 Metabolic encephalopathy: Secondary | ICD-10-CM | POA: Diagnosis not present

## 2020-12-05 DIAGNOSIS — L89152 Pressure ulcer of sacral region, stage 2: Secondary | ICD-10-CM | POA: Diagnosis not present

## 2020-12-05 DIAGNOSIS — E039 Hypothyroidism, unspecified: Secondary | ICD-10-CM | POA: Diagnosis not present

## 2020-12-05 LAB — BASIC METABOLIC PANEL
Anion gap: 12 (ref 5–15)
BUN: 20 mg/dL (ref 8–23)
CO2: 25 mmol/L (ref 22–32)
Calcium: 8.5 mg/dL — ABNORMAL LOW (ref 8.9–10.3)
Chloride: 106 mmol/L (ref 98–111)
Creatinine, Ser: 0.71 mg/dL (ref 0.44–1.00)
GFR, Estimated: >60 mL/min (ref >60)
Glucose, Bld: 156 mg/dL — ABNORMAL HIGH (ref 70–99)
Potassium: 4 mmol/L (ref 3.5–5.1)
Sodium: 143 mmol/L (ref 135–145)

## 2020-12-05 LAB — AMMONIA: Ammonia: 27 umol/L (ref 9–35)

## 2020-12-05 LAB — CBC WITH DIFFERENTIAL/PLATELET
Abs Immature Granulocytes: 0.28 10*3/uL — ABNORMAL HIGH (ref 0.00–0.07)
Basophils Absolute: 0 10*3/uL (ref 0.0–0.1)
Basophils Relative: 0 %
Eosinophils Absolute: 0.1 10*3/uL (ref 0.0–0.5)
Eosinophils Relative: 1 %
HCT: 26 % — ABNORMAL LOW (ref 36.0–46.0)
Hemoglobin: 8 g/dL — ABNORMAL LOW (ref 12.0–15.0)
Immature Granulocytes: 3 %
Lymphocytes Relative: 7 %
Lymphs Abs: 0.7 10*3/uL (ref 0.7–4.0)
MCH: 30.3 pg (ref 26.0–34.0)
MCHC: 30.8 g/dL (ref 30.0–36.0)
MCV: 98.5 fL (ref 80.0–100.0)
Monocytes Absolute: 0.7 10*3/uL (ref 0.1–1.0)
Monocytes Relative: 7 %
Neutro Abs: 7.9 10*3/uL — ABNORMAL HIGH (ref 1.7–7.7)
Neutrophils Relative %: 82 %
Platelets: 166 10*3/uL (ref 150–400)
RBC: 2.64 MIL/uL — ABNORMAL LOW (ref 3.87–5.11)
RDW: 18.3 % — ABNORMAL HIGH (ref 11.5–15.5)
WBC: 9.6 10*3/uL (ref 4.0–10.5)
nRBC: 0.5 % — ABNORMAL HIGH (ref 0.0–0.2)

## 2020-12-05 LAB — GLUCOSE, CAPILLARY
Glucose-Capillary: 148 mg/dL — ABNORMAL HIGH (ref 70–99)
Glucose-Capillary: 132 mg/dL — ABNORMAL HIGH (ref 70–99)
Glucose-Capillary: 126 mg/dL — ABNORMAL HIGH (ref 70–99)
Glucose-Capillary: 117 mg/dL — ABNORMAL HIGH (ref 70–99)
Glucose-Capillary: 107 mg/dL — ABNORMAL HIGH (ref 70–99)
Glucose-Capillary: 125 mg/dL — ABNORMAL HIGH (ref 70–99)
Glucose-Capillary: 138 mg/dL — ABNORMAL HIGH (ref 70–99)

## 2020-12-05 LAB — PHOSPHORUS: Phosphorus: 2.7 mg/dL (ref 2.5–4.6)

## 2020-12-05 LAB — VITAMIN B12: Vitamin B-12: 1540 pg/mL — ABNORMAL HIGH (ref 180–914)

## 2020-12-05 LAB — MAGNESIUM: Magnesium: 2.2 mg/dL (ref 1.7–2.4)

## 2020-12-05 MED ORDER — THIAMINE HCL 100 MG/ML IJ SOLN
500.0000 mg | INTRAVENOUS | Status: DC
  Administered 2020-12-05 – 2020-12-06 (×2): 500 mg via INTRAVENOUS
  Filled 2020-12-05 (×2): qty 5

## 2020-12-05 NOTE — Progress Notes (Signed)
PROGRESS NOTE  Kaylee King KZS:[id number] DOB: [birth year]   PCP: Fleet Contras, MD  Patient is from: Home  DOA: 11/23/2020 LOS: 12  Chief complaints: Altered mental status and GI bleed  Brief Narrative / Interim history: 80 year old female with PMH of alcohol abuse, cirrhosis and hypothyroidism presenting with altered mental status and upper GIB from variceal bleed.  Initially admitted to ICU requiring intubations and 6 units of PRBC and 2 units of FFP.  Eventually extubated and transferred to [facility] service for ongoing care.  Currently, primary problem includes ongoing encephalopathy and poor p.o. intake.  Subjective: Seen and examined earlier this morning.  No major events overnight of this morning.  Lying in bed comfortably.  Mittens over both hands.  She is a sleepy but awakes to voice.  She is oriented to self.  Follows command but not a great historian.  Objective: Vitals:   12/05/20 0830 12/05/20 1118 12/05/20 1125 12/05/20 1518  BP: (!) 149/69  99/79 (!) 158/69  Pulse: 76 67 68 75  Resp: 20 (!) 22 19 (!) 23  Temp: (!) 97.4 F (36.3 C)  (!) 97.4 F (36.3 C)   TempSrc: Axillary  Axillary   SpO2: 100% 97% 96% 93%  Weight:      Height:        Intake/Output Summary (Last 24 hours) at 12/05/2020 1520 Last data filed at 12/05/2020 1201 Gross per 24 hour  Intake 2919 ml  Output 1000 ml  Net 1919 ml   Filed Weights   11/30/20 0500 12/01/20 0500 12/02/20 0500  Weight: 84.2 kg 77.6 kg 82.5 kg    Examination:  GENERAL: No apparent distress.  Nontoxic. HEENT: MMM.  Vision and hearing grossly intact.  NECK: Supple.  No apparent JVD.  RESP: 95% on 4 L.  No IWOB.  Fair aeration bilaterally. CVS:  RRR. Heart sounds normal.  ABD/GI/GU: BS+. Abd soft, NTND.  Rectal tube. MSK/EXT:  Moves extremities. No apparent deformity.  Mittens over both hands. SKIN: no apparent skin lesion or wound NEURO: Sleepy but wakes to voice.  Oriented to self.  Follows command.  No apparent focal neuro  deficit.  PSYCH: Calm.  No distress or agitation.  Procedures:  Intubation and extubation  Microbiology summarized: COVID-19 PCR nonreactive.  Assessment & Plan: Acute metabolic encephalopathy-was concern about hepatic encephalopathy.  However, ammonia level within normal.  CT head without acute finding.  TSH 51.3 >>22.68.  Also history of alcohol abuse.  -Continue Synthroid -Continue lactulose and rifaximin -Check B12, RPR and thiamine level -Trial of high-dose thiamine -Reorientation and delirium precautions.  ABLA due to variceal bleed.  Patient with history of cirrhosis Recent Labs    11/27/20 1507 11/28/20 0530 11/28/20 1543 11/29/20 7322 11/29/20 1645 11/30/20 0500 12/01/20 0536 12/02/20 1604 12/03/20 0629 12/05/20 0216  HGB 7.4* 6.5* 7.6* 7.3* 7.3* 7.3* 7.7* 8.0* 7.7* 8.0*  -EGD on 1/20 showed Grade III varices (banded), clotted blood in the gastric fundus, type I GEV, nonbleeding GU x3 with no stigmata of bleeding. -Received about 6 units or pRBC and 2 units of FFP. -SCD for VT prophylaxis -H. pylori IgG positive but H. pylori IgM negative-not sure if she has been treated previously -Continue PPI therapy   Acute hypoxic respiratory failure in the setting of possible aspiration pneumonia -S/p ETT.  Currently 95% on 4 L.   -Completed 7 days of antibiotic course -Aspiration precautions -Wean oxygen as able   Hypothyroidism: TSH 51.3>> 22.62 -Continue Synthroid  Essential hypertension: BP within fair range.  Continue amlodipine  UTI- treated with ceftriaxone   AKI: Resolved  Goal of care: Full code. -Palliative care recommended outpatient follow-up  Feeding difficulties partly due to encephalopathy -SLP recommended n.p.o. -Continue enteral nutrition via nasogastric tube Body mass index is 31.22 kg/m. Nutrition Problem: Inadequate oral intake Etiology: acute illness Signs/Symptoms: NPO status Interventions: Tube feeding,Prostat   Pressure  injury: Stage II sacral and stage II left elbow Pressure Injury 11/24/20 Sacrum Medial Stage 2 -  Partial thickness loss of dermis presenting as a shallow open injury with a red, pink wound bed without slough. non-blanchable red spot on boney area on sacurm with open skin. (Active)  11/24/20 1900  Location: Sacrum  Location Orientation: Medial  Staging: Stage 2 -  Partial thickness loss of dermis presenting as a shallow open injury with a red, pink wound bed without slough.  Wound Description (Comments): non-blanchable red spot on boney area on sacurm with open skin.  Present on Admission: No     Pressure Injury 11/29/20 Elbow Left;Posterior Stage 2 -  Partial thickness loss of dermis presenting as a shallow open injury with a red, pink wound bed without slough. (Active)  11/29/20 1000  Location: Elbow  Location Orientation: Left;Posterior  Staging: Stage 2 -  Partial thickness loss of dermis presenting as a shallow open injury with a red, pink wound bed without slough.  Wound Description (Comments):   Present on Admission:    DVT prophylaxis:  SCDs Start: 11/23/20 1654  Code Status: Full code Family Communication: Patient and/or RN. Available if any question.   Level of care: Progressive Status is: Inpatient  Remains inpatient appropriate because:Altered mental status, Unsafe d/c plan, IV treatments appropriate due to intensity of illness or inability to take PO and Inpatient level of care appropriate due to severity of illness   Dispo:  Patient From: Home  Planned Disposition: Skilled Nursing Facility  Expected discharge date: 12/07/2020  Medically stable for discharge: No         Consultants:  PCCM Palliative medicine   Sch Meds:  Scheduled Meds: . amLODipine  5 mg Per NG tube Daily  . chlorhexidine  15 mL Mouth Rinse BID  . Chlorhexidine Gluconate Cloth  6 each Topical Daily  . feeding supplement (PROSource TF)  45 mL Per Tube Daily  . free water  200 mL Per Tube  Q4H  . influenza vaccine adjuvanted  0.5 mL Intramuscular Tomorrow-1000  . insulin aspart  0-24 Units Subcutaneous Q4H  . lactulose  30 g Per Tube TID  . levothyroxine  100 mcg Per Tube Q0600  . mouth rinse  15 mL Mouth Rinse q12n4p  . pantoprazole sodium  40 mg Per Tube BID  . rifaximin  550 mg Per Tube BID   Continuous Infusions: . sodium chloride Stopped (11/30/20 1413)  . feeding supplement (OSMOLITE 1.5 CAL) 1,000 mL (12/05/20 0828)   PRN Meds:.sodium chloride  Antimicrobials: Anti-infectives (From admission, onward)   Start     Dose/Rate Route Frequency Ordered Stop   12/01/20 1000  rifaximin (XIFAXAN) tablet 550 mg        550 mg Per Tube 2 times daily 12/01/20 0903     11/24/20 1200  cefTRIAXone (ROCEPHIN) 2 g in sodium chloride 0.9 % 100 mL IVPB        2 g 200 mL/hr over 30 Minutes Intravenous Every 24 hours 11/24/20 1047 11/30/20 1305   11/23/20 1730  cefTRIAXone (ROCEPHIN) 2 g in sodium chloride 0.9 % 100 mL IVPB  Status:  Discontinued        2 g 200 mL/hr over 30 Minutes Intravenous Every 24 hours 11/23/20 1721 11/24/20 1047       I have personally reviewed the following labs and images: CBC: Recent Labs  Lab 11/30/20 0500 12/01/20 0536 12/02/20 1604 12/03/20 0629 12/05/20 0216  WBC 10.6* 8.9 8.3 8.8 9.6  NEUTROABS  --   --   --   --  7.9*  HGB 7.3* 7.7* 8.0* 7.7* 8.0*  HCT 24.1* 24.1* 25.4* 25.8* 26.0*  MCV 93.8 94.1 94.8 97.0 98.5  PLT 129* 119* 120* 126* 166   BMP &GFR Recent Labs  Lab 11/29/20 0613 11/30/20 0500 12/01/20 0536 12/01/20 0731 12/02/20 0718 12/02/20 1604 12/03/20 0629 12/05/20 0216  NA 145 143 138 140  --  141 140 143  K 3.1* 3.3* >7.5* 3.9  --  3.9 3.7 4.0  CL 107 104 106 101  --  104 103 106  CO2 27 29 28 28   --  27 27 25   GLUCOSE 151* 142* 102* 105*  --  161* 155* 156*  BUN 23 20 18 17   --  20 18 20   CREATININE 1.09* 0.89 0.93 0.95  --  0.83 0.80 0.71  CALCIUM 8.1* 8.3* 8.4* 8.9  --  8.7* 8.6* 8.5*  MG 1.9 2.1 1.9  --   1.9  --   --  2.2  PHOS 2.1* 2.6 2.6  --  2.7  --   --  2.7   Estimated Creatinine Clearance: 57.3 mL/min (by C-G formula based on SCr of 0.71 mg/dL). Liver & Pancreas: Recent Labs  Lab 11/29/20 0613 12/02/20 1604  AST 57* 106*  ALT 19 48*  ALKPHOS 86 143*  BILITOT 0.9 1.2  PROT 5.4* 6.2*  ALBUMIN 2.9* 2.7*   No results for input(s): LIPASE, AMYLASE in the last 168 hours. Recent Labs  Lab 11/29/20 0613 12/05/20 0216  AMMONIA 28 27   Diabetic: No results for input(s): HGBA1C in the last 72 hours. Recent Labs  Lab 12/04/20 2028 12/05/20 0347 12/05/20 0820 12/05/20 1126 12/05/20 1517  GLUCAP 132* 148* 132* 126* 117*   Cardiac Enzymes: No results for input(s): CKTOTAL, CKMB, CKMBINDEX, TROPONINI in the last 168 hours. No results for input(s): PROBNP in the last 8760 hours. Coagulation Profile: No results for input(s): INR, PROTIME in the last 168 hours. Thyroid Function Tests: No results for input(s): TSH, T4TOTAL, FREET4, T3FREE, THYROIDAB in the last 72 hours. Lipid Profile: No results for input(s): CHOL, HDL, LDLCALC, TRIG, CHOLHDL, LDLDIRECT in the last 72 hours. Anemia Panel: No results for input(s): VITAMINB12, FOLATE, FERRITIN, TIBC, IRON, RETICCTPCT in the last 72 hours. Urine analysis:    Component Value Date/Time   COLORURINE YELLOW 11/23/2020 1853   APPEARANCEUR CLOUDY (A) 11/23/2020 1853   LABSPEC 1.015 11/23/2020 1853   PHURINE 5.0 11/23/2020 1853   GLUCOSEU NEGATIVE 11/23/2020 1853   HGBUR NEGATIVE 11/23/2020 1853   BILIRUBINUR NEGATIVE 11/23/2020 1853   KETONESUR NEGATIVE 11/23/2020 1853   PROTEINUR 30 (A) 11/23/2020 1853   NITRITE NEGATIVE 11/23/2020 1853   LEUKOCYTESUR MODERATE (A) 11/23/2020 1853   Sepsis Labs: Invalid input(s): PROCALCITONIN, LACTICIDVEN  Microbiology: No results found for this or any previous visit (from the past 240 hour(s)).  Radiology Studies: No results found.    Kaylee King T. Nikko Quast [facility]  If 7PM-7AM,  please contact night-coverage [email or web address] 12/05/2020, 3:20 PM

## 2020-12-05 NOTE — Progress Notes (Signed)
Occupational Therapy Treatment Patient Details Name: Kaylee King MRN: [id number] DOB: 01/01/1941 Today's Date: 12/05/2020    History of present illness 80 year old female with history of hypothyroidism, medical noncompliance, NASH cirrhosis, presents to [facility] on 1/20 with progressive headaches, fatigue, confusion, and hypotension of unclear etiology. In the ED, had coffee ground/maroon colored hematemesis and was intubated for airway protection. CT head 1/20 was negative. Pt found to have upper GI bleed from esophageal varices and is followed by GI. EDG 1/20- Grade III esophageal varices, banded x6. Type I GE varices without bleeding. Nonbleeding gastric ulcers x3. ETT 1/20-1/27.   OT comments  Pt seen in conjunction with PT to maximize pts activity tolerance and optimize pts participation. Pt continues to present with impaired balance, decreased activity tolerance, generalized deconditioning, and cognitive deficits impacting pts ability to complete BADLs independently. Pt making more efforts to verbalize this session although verbalizations were more automatic and non specific to questions being asked. Pt required total A +2 to mobilize to EOB. Pt would continue to benefit from skilled occupational therapy while admitted and after d/c to address the below listed limitations in order to improve overall functional mobility and facilitate independence with BADL participation. DC plan remains appropriate, will follow acutely per POC.   RRmax 37 breaths/min, SpO2 min 86% on 3LO2, increased O2 to 4LO2. HR stable   Follow Up Recommendations  SNF    Equipment Recommendations  Wheelchair (measurements OT);Wheelchair cushion (measurements OT);Hospital bed;Other (comment) (hoyer)    Recommendations for Other Services      Precautions / Restrictions Precautions Precautions: Fall Precaution Comments: flexiseal, cortrak, mitts Restrictions Weight Bearing Restrictions: No       Mobility Bed  Mobility Overal bed mobility: Needs Assistance Bed Mobility: Supine to Sit;Sit to Supine;Rolling Rolling: Total assist;+2 for physical assistance;+2 for safety/equipment   Supine to sit: Total assist;+2 for physical assistance;+2 for safety/equipment Sit to supine: Total assist;+2 for physical assistance;+2 for safety/equipment;HOB elevated   General bed mobility comments: total +2 all aspects, pt at times resistant to mobility especially when moving to EOB.  Transfers                 General transfer comment: NT    Balance Overall balance assessment: Needs assistance Sitting-balance support: Feet supported Sitting balance-Leahy Scale: Poor Sitting balance - Comments: reliant on truncal support, mod-max assist Postural control: Posterior lean                                 ADL either performed or assessed with clinical judgement   ADL Overall ADL's : Needs assistance/impaired                                     Functional mobility during ADLs: Total assistance;+2 for physical assistance (bed mobility only) General ADL Comments: pt verbalizing more today but continues to requrie total A for mobility     Vision       Perception     Praxis      Cognition Arousal/Alertness: Awake/alert Behavior During Therapy: Kaylee King for tasks assessed/performed;Anxious Overall Cognitive Status: Difficult to assess Area of Impairment: Attention;Following commands;Safety/judgement;Problem solving;Awareness;Orientation                 Orientation Level: Disoriented to;Time;Situation Current Attention Level: Focused   Following Commands: Follows one step commands inconsistently Safety/Judgement: Decreased awareness of safety;Decreased  awareness of deficits Awareness: Intellectual Problem Solving: Slow processing;Decreased initiation;Difficulty sequencing;Requires verbal cues;Requires tactile cues General Comments: oriented to self. Pt makes  verbalizations throughout session, but tends to speak in generalizations. Anxious when moving to and from EOB.        Exercises     Shoulder Instructions       General Comments RRmax 37 breaths/min, SpO2 min 86% on 3LO2, increased O2 to 4LO2. HR stable    Pertinent Vitals/ Pain       Pain Assessment: Faces Faces Pain Scale: Hurts a little bit Pain Location: legs Pain Descriptors / Indicators: Grimacing;Discomfort;Guarding Pain Intervention(s): Limited activity within patient's tolerance;Monitored during session;Repositioned  Home Living                                          Prior Functioning/Environment              Frequency  Min 2X/week        Progress Toward Goals  OT Goals(current goals can now be found in the care plan section)  Progress towards OT goals: Progressing toward goals  Acute Rehab OT Goals Potential to Achieve Goals: Fair  Plan Discharge plan remains appropriate;Frequency remains appropriate    Co-evaluation      Reason for Co-Treatment: For patient/therapist safety;To address functional/ADL transfers PT goals addressed during session: Mobility/safety with mobility;Balance OT goals addressed during session: ADL's and self-care      AM-PAC OT "6 Clicks" Daily Activity     Outcome Measure   Help from another person eating meals?: Total Help from another person taking care of personal grooming?: A Lot Help from another person toileting, which includes using toliet, bedpan, or urinal?: Total Help from another person bathing (including washing, rinsing, drying)?: Total Help from another person to put on and taking off regular upper body clothing?: Total Help from another person to put on and taking off regular lower body clothing?: Total 6 Click Score: 7    End of Session    OT Visit Diagnosis: Unsteadiness on feet (R26.81);Muscle weakness (generalized) (M62.81)   Activity Tolerance Patient tolerated treatment  well   Patient Left in bed;with call bell/phone within reach;with bed alarm set   Nurse Communication Mobility status        Time: 3329-5188 OT Time Calculation (min): 27 min  Charges: OT General Charges $OT Visit: 1 Visit OT Treatments $Therapeutic Activity: 8-22 mins  Lenor Derrick., COTA/L Acute Rehabilitation Services [phone]    Barron Schmid 12/05/2020, 4:58 PM

## 2020-12-05 NOTE — Care Management Important Message (Signed)
Important Message  Patient Details  Name: Kaylee King MRN: [id number] Date of Birth: 1941/04/17   Medicare Important Message Given:  Yes     Dorena Bodo 12/05/2020, 2:48 PM

## 2020-12-05 NOTE — Progress Notes (Addendum)
Physical Therapy Treatment Patient Details Name: Kaylee King MRN: [id number] DOB: Jan 20, 1941 Today's Date: 12/05/2020    History of Present Illness 80 year old female with history of hypothyroidism, medical noncompliance, NASH cirrhosis, presents to [facility] on 1/20 with progressive headaches, fatigue, confusion, and hypotension of unclear etiology. In the ED, had coffee ground/maroon colored hematemesis and was intubated for airway protection. CT head 1/20 was negative. Pt found to have upper GI bleed from esophageal varices and is followed by GI. EDG 1/20- Grade III esophageal varices, banded x6. Type I GE varices without bleeding. Nonbleeding gastric ulcers x3. ETT 1/20-1/27.    PT Comments    Pt more talkative today, but tends to speak in generalizations and yes/no. Pt continuing to require total assist  +2 for mobility, and reports discomfort during mobility and EOB sitting. Pt also demonstrating tachypnea, does not follow cues for recovering breath via breathing technique but recovers with rest. Of note, pt with redness and areas of breakdown on buttocks, barrier cream applied and RN notified. PT to continue to follow acutely.    Follow Up Recommendations  SNF;Supervision/Assistance - 24 hour     Equipment Recommendations  Other (comment) (TBD)    Recommendations for Other Services       Precautions / Restrictions Precautions Precautions: Fall Precaution Comments: flexiseal, cortrak Restrictions Weight Bearing Restrictions: No    Mobility  Bed Mobility Overal bed mobility: Needs Assistance Bed Mobility: Supine to Sit;Sit to Supine;Rolling Rolling: Total assist;+2 for physical assistance;+2 for safety/equipment   Supine to sit: Total assist;+2 for physical assistance;+2 for safety/equipment Sit to supine: Total assist;+2 for physical assistance;+2 for safety/equipment;HOB elevated   General bed mobility comments: total +2 all aspects, pt at times resistant to mobility especially  when moving to EOB.  Transfers                 General transfer comment: NT  Ambulation/Gait                 Stairs             Wheelchair Mobility    Modified Rankin (Stroke Patients Only)       Balance Overall balance assessment: Needs assistance Sitting-balance support: Feet supported Sitting balance-Leahy Scale: Poor Sitting balance - Comments: reliant on truncal support, mod-max assist Postural control: Posterior lean                                  Cognition Arousal/Alertness: Awake/alert Behavior During Therapy: WFL for tasks assessed/performed;Anxious Overall Cognitive Status: Difficult to assess Area of Impairment: Attention;Following commands;Safety/judgement;Problem solving;Awareness;Orientation                 Orientation Level: Disoriented to;Time;Situation Current Attention Level: Focused   Following Commands: Follows one step commands inconsistently Safety/Judgement: Decreased awareness of safety;Decreased awareness of deficits Awareness: Intellectual Problem Solving: Slow processing;Decreased initiation;Difficulty sequencing;Requires verbal cues;Requires tactile cues General Comments: oriented to self. Pt makes verbalizations throughout session, but tends to speak in generalizations. Anxious when moving to and from EOB.      Exercises      General Comments General comments (skin integrity, edema, etc.): RRmax 37 breaths/min, SpO2 min 86% on 3LO2, increased O2 to 4LO2. HR stable      Pertinent Vitals/Pain Pain Assessment: Faces Faces Pain Scale: Hurts a little bit Pain Location: legs Pain Descriptors / Indicators: Grimacing;Discomfort;Guarding Pain Intervention(s): Limited activity within patient's tolerance;Monitored during session;Repositioned    Home  Living                      Prior Function            PT Goals (current goals can now be found in the care plan section) Acute Rehab PT  Goals PT Goal Formulation: Patient unable to participate in goal setting Time For Goal Achievement: 12/15/20 Potential to Achieve Goals: Fair Progress towards PT goals: Progressing toward goals    Frequency    Min 2X/week      PT Plan Current plan remains appropriate    Co-evaluation PT/OT/SLP Co-Evaluation/Treatment: Yes Reason for Co-Treatment: For patient/therapist safety;To address functional/ADL transfers PT goals addressed during session: Mobility/safety with mobility;Balance        AM-PAC PT "6 Clicks" Mobility   Outcome Measure  Help needed turning from your back to your side while in a flat bed without using bedrails?: Total Help needed moving from lying on your back to sitting on the side of a flat bed without using bedrails?: Total Help needed moving to and from a bed to a chair (including a wheelchair)?: Total Help needed standing up from a chair using your arms (e.g., wheelchair or bedside chair)?: Total Help needed to walk in hospital room?: Total Help needed climbing 3-5 steps with a railing? : Total 6 Click Score: 6    End of Session Equipment Utilized During Treatment: Oxygen Activity Tolerance: Patient limited by fatigue Patient left: in bed;with call bell/phone within reach;with bed alarm set;with restraints reapplied;with SCD's reapplied (bilateral hand mitts) Nurse Communication: Mobility status PT Visit Diagnosis: Other abnormalities of gait and mobility (R26.89);Difficulty in walking, not elsewhere classified (R26.2)     Time: 2248-2500 PT Time Calculation (min) (ACUTE ONLY): 25 min  Charges:  $Therapeutic Activity: 8-22 mins                     Marye Round, PT Acute Rehabilitation Services Pager [phone]  Office [phone]    Truddie Coco 12/05/2020, 4:29 PM

## 2020-12-06 ENCOUNTER — Inpatient Hospital Stay (HOSPITAL_COMMUNITY): Payer: Medicare Other

## 2020-12-06 DIAGNOSIS — R0902 Hypoxemia: Secondary | ICD-10-CM

## 2020-12-06 DIAGNOSIS — D62 Acute posthemorrhagic anemia: Secondary | ICD-10-CM | POA: Diagnosis not present

## 2020-12-06 DIAGNOSIS — G9341 Metabolic encephalopathy: Secondary | ICD-10-CM | POA: Diagnosis not present

## 2020-12-06 DIAGNOSIS — Z7189 Other specified counseling: Secondary | ICD-10-CM | POA: Diagnosis not present

## 2020-12-06 DIAGNOSIS — L89152 Pressure ulcer of sacral region, stage 2: Secondary | ICD-10-CM | POA: Diagnosis not present

## 2020-12-06 DIAGNOSIS — J9602 Acute respiratory failure with hypercapnia: Secondary | ICD-10-CM

## 2020-12-06 DIAGNOSIS — E039 Hypothyroidism, unspecified: Secondary | ICD-10-CM | POA: Diagnosis not present

## 2020-12-06 DIAGNOSIS — J9601 Acute respiratory failure with hypoxia: Secondary | ICD-10-CM | POA: Diagnosis not present

## 2020-12-06 DIAGNOSIS — L89022 Pressure ulcer of left elbow, stage 2: Secondary | ICD-10-CM | POA: Diagnosis not present

## 2020-12-06 LAB — POCT I-STAT 7, (LYTES, BLD GAS, ICA,H+H)
Acid-Base Excess: 5 mmol/L — ABNORMAL HIGH (ref 0.0–2.0)
Bicarbonate: 33 mmol/L — ABNORMAL HIGH (ref 20.0–28.0)
Calcium, Ion: 1.29 mmol/L (ref 1.15–1.40)
HCT: 26 % — ABNORMAL LOW (ref 36.0–46.0)
Hemoglobin: 8.8 g/dL — ABNORMAL LOW (ref 12.0–15.0)
O2 Saturation: 98 %
Potassium: 4.4 mmol/L (ref 3.5–5.1)
Sodium: 145 mmol/L (ref 135–145)
TCO2: 35 mmol/L — ABNORMAL HIGH (ref 22–32)
pCO2 arterial: 67.1 mmHg (ref 32.0–48.0)
pH, Arterial: 7.3 — ABNORMAL LOW (ref 7.350–7.450)
pO2, Arterial: 118 mmHg — ABNORMAL HIGH (ref 83.0–108.0)

## 2020-12-06 LAB — COMPREHENSIVE METABOLIC PANEL
ALT: 114 U/L — ABNORMAL HIGH (ref 0–44)
AST: 173 U/L — ABNORMAL HIGH (ref 15–41)
Albumin: 2.8 g/dL — ABNORMAL LOW (ref 3.5–5.0)
Alkaline Phosphatase: 220 U/L — ABNORMAL HIGH (ref 38–126)
Anion gap: 9 (ref 5–15)
BUN: 23 mg/dL (ref 8–23)
CO2: 30 mmol/L (ref 22–32)
Calcium: 8.8 mg/dL — ABNORMAL LOW (ref 8.9–10.3)
Chloride: 105 mmol/L (ref 98–111)
Creatinine, Ser: 0.72 mg/dL (ref 0.44–1.00)
GFR, Estimated: >60 mL/min (ref >60)
Glucose, Bld: 155 mg/dL — ABNORMAL HIGH (ref 70–99)
Potassium: 4.3 mmol/L (ref 3.5–5.1)
Sodium: 144 mmol/L (ref 135–145)
Total Bilirubin: 1.4 mg/dL — ABNORMAL HIGH (ref 0.3–1.2)
Total Protein: 7.5 g/dL (ref 6.5–8.1)

## 2020-12-06 LAB — BLOOD GAS, VENOUS
Acid-Base Excess: 5.1 mmol/L — ABNORMAL HIGH (ref 0.0–2.0)
Bicarbonate: 31.1 mmol/L — ABNORMAL HIGH (ref 20.0–28.0)
FIO2: 48
O2 Saturation: 61.4 %
Patient temperature: 37.2
pCO2, Ven: 65.4 mmHg — ABNORMAL HIGH (ref 44.0–60.0)
pH, Ven: 7.3 (ref 7.250–7.430)
pO2, Ven: 38 mmHg (ref 32.0–45.0)

## 2020-12-06 LAB — CBC
HCT: 29.9 % — ABNORMAL LOW (ref 36.0–46.0)
Hemoglobin: 8.5 g/dL — ABNORMAL LOW (ref 12.0–15.0)
MCH: 29.1 pg (ref 26.0–34.0)
MCHC: 28.4 g/dL — ABNORMAL LOW (ref 30.0–36.0)
MCV: 102.4 fL — ABNORMAL HIGH (ref 80.0–100.0)
Platelets: 241 10*3/uL (ref 150–400)
RBC: 2.92 MIL/uL — ABNORMAL LOW (ref 3.87–5.11)
RDW: 18.4 % — ABNORMAL HIGH (ref 11.5–15.5)
WBC: 17.1 10*3/uL — ABNORMAL HIGH (ref 4.0–10.5)
nRBC: 0.5 % — ABNORMAL HIGH (ref 0.0–0.2)

## 2020-12-06 LAB — PROCALCITONIN: Procalcitonin: 0.2 ng/mL

## 2020-12-06 LAB — GLUCOSE, CAPILLARY
Glucose-Capillary: 120 mg/dL — ABNORMAL HIGH (ref 70–99)
Glucose-Capillary: 125 mg/dL — ABNORMAL HIGH (ref 70–99)
Glucose-Capillary: 139 mg/dL — ABNORMAL HIGH (ref 70–99)
Glucose-Capillary: 168 mg/dL — ABNORMAL HIGH (ref 70–99)
Glucose-Capillary: 87 mg/dL (ref 70–99)
Glucose-Capillary: 92 mg/dL (ref 70–99)

## 2020-12-06 LAB — BRAIN NATRIURETIC PEPTIDE: B Natriuretic Peptide: 231.5 pg/mL — ABNORMAL HIGH (ref 0.0–100.0)

## 2020-12-06 LAB — AMMONIA: Ammonia: 18 umol/L (ref 9–35)

## 2020-12-06 LAB — RPR: RPR Ser Ql: NONREACTIVE

## 2020-12-06 MED ORDER — MELATONIN 3 MG PO TABS
3.0000 mg | ORAL_TABLET | Freq: Every day | ORAL | Status: DC
  Administered 2020-12-06: 3 mg via ORAL
  Filled 2020-12-06: qty 1

## 2020-12-06 MED ORDER — SODIUM CHLORIDE 0.9 % IV SOLN
3.0000 g | Freq: Four times a day (QID) | INTRAVENOUS | Status: DC
  Administered 2020-12-06 – 2020-12-07 (×4): 3 g via INTRAVENOUS
  Filled 2020-12-06: qty 3
  Filled 2020-12-06 (×4): qty 8
  Filled 2020-12-06: qty 3

## 2020-12-06 MED ORDER — THIAMINE HCL 100 MG/ML IJ SOLN
250.0000 mg | Freq: Every day | INTRAVENOUS | Status: AC
  Administered 2020-12-09 – 2020-12-14 (×5): 250 mg via INTRAVENOUS
  Filled 2020-12-06 (×7): qty 2.5

## 2020-12-06 MED ORDER — FUROSEMIDE 10 MG/ML IJ SOLN
40.0000 mg | Freq: Four times a day (QID) | INTRAMUSCULAR | Status: AC
  Administered 2020-12-06 – 2020-12-07 (×2): 40 mg via INTRAVENOUS
  Filled 2020-12-06 (×2): qty 4

## 2020-12-06 MED ORDER — LACTULOSE 10 GM/15ML PO SOLN
20.0000 g | Freq: Two times a day (BID) | ORAL | Status: DC
  Administered 2020-12-06 – 2020-12-08 (×5): 20 g
  Filled 2020-12-06 (×5): qty 30

## 2020-12-06 MED ORDER — THIAMINE HCL 100 MG/ML IJ SOLN
100.0000 mg | Freq: Every day | INTRAMUSCULAR | Status: DC
  Administered 2020-12-15 – 2020-12-19 (×5): 100 mg via INTRAVENOUS
  Filled 2020-12-06 (×5): qty 2

## 2020-12-06 MED ORDER — THIAMINE HCL 100 MG/ML IJ SOLN
500.0000 mg | Freq: Three times a day (TID) | INTRAVENOUS | Status: AC
  Administered 2020-12-06 – 2020-12-08 (×5): 500 mg via INTRAVENOUS
  Filled 2020-12-06 (×7): qty 5

## 2020-12-06 MED ORDER — IPRATROPIUM-ALBUTEROL 0.5-2.5 (3) MG/3ML IN SOLN
3.0000 mL | Freq: Three times a day (TID) | RESPIRATORY_TRACT | Status: DC
  Administered 2020-12-06 – 2020-12-07 (×2): 3 mL via RESPIRATORY_TRACT
  Filled 2020-12-06 (×2): qty 3

## 2020-12-06 NOTE — Progress Notes (Signed)
NAME:  Kaylee King, MRN:  [id number], DOB:  24-Jan-1941, LOS: 13 ADMISSION DATE:  11/23/2020, CONSULTATION DATE:  11/23/20 REFERRING MD:  Bernette Mayers CHIEF COMPLAINT:  AMS   Brief History:  79 yoF with history of NASH cirrhosis and hypothyroidism noncompliant admitted with GIB, hemorrhagic shock, and encephalopathy presumed secondary to hepatic/ metabolic with elevated TSH, UTI, presumed pna requiring intubation and aggressive resuscitation.  Underwent EGD and varices banded.  Extubated and transferred to [facility] on 1/29 but has remained encephalopathic without clear etiology.  On 2/2 patient had desaturation on her [place] requiring RRT and NRB, placed on BiPAP.  PCCM consulted for evaluation and possible transfer back to ICU.   History of Present Illness:  37 yoF presented with progressive headaches, fatigue, confusion, and hypotension. Over the past 3 days prior to admission, patient started having a debilitating headache with associated fatigue and confusion. Brought in by EMS, noted to have blood around nares. Patient was unable to provide history, daughter was at bedside. In the ED, had coffee ground/maroon colored hematemesis. Started on levophed and intubated to protect her airway. GI was consulted and she was started on octreotide and PPI infusion.  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 1/25 transfused 1 unit of packed red blood cells 1/29 tx to [facility] service 2/2 PCCM reconsult  Consults:  PCCM GI 1/20 PMT 1/29 Neurology 2/2  Procedures:  Central venous cath 1/20 >> 1/28 ETT  1/20>> 1/27 cortrak 1/24 >>  Significant Diagnostic Tests:  1/20 CT Head >> 1. No acute  intracranial findings. 2. Periventricular white matter and corona radiata hypodensities favor chronic ischemic microvascular white matter disease.  CXR basilar atelectasis versus possible pneumonia (aspiration?)  EDG 1/20- Grade III esophageal varices, banded x6. Type I GE varices without bleeding. Nonbleeding gastric ulcers x3.   Micro Data:  1/20 SARS >> neg 1/20 MRSA  >> neg 1/20 UC >> e. Coli, morganella   Antimicrobials:  Ceftriaxone 1/20 >> 1/27 Unasyn 2/2 >>  Rifaximin 1/28 >> Interim History / Subjective:  - TRH reports ongoing encephalopathy since transfer out of ICU - Neurology consulted today  - RRT called this afternoon given desaturation into the 60's on [place] previously on 5-7 , otherwise hemodynamically stable  placed NRB transitioned to BiPAP after ABG showing 7./ 67/ 118/ 33 - Failed SLP 1/31, having difficulty handling ice chips 2 days ago - +1.9L/ net +12.1L - Has remained afebrile, normal WBC, stable renal function - CXR showing persistent, slightly worse bibasilar opacities with associated pleural effusions  Objective   Blood pressure (!) 118/52, pulse 87, temperature (!) 97 F (36.1 C), temperature source Axillary, resp. rate (!) 25, height 5\' 4"  (1.626 m), weight 82.5 kg, SpO2 100 %.    FiO2 (%):  [50 %] 50 %   Intake/Output Summary (Last 24 hours) at 12/06/2020 1651 Last data filed at 12/06/2020 0700 Gross per 24 hour  Intake 469 ml  Output 1000 ml  Net -531 ml   Filed Weights   11/30/20 0500 12/01/20 0500 12/02/20 0500  Weight: 84.2 kg 77.6 kg 82.5 kg   Examination: General:  Frail elderly female sitting upright in bed in NAD HEENT: full face mask, pupils 3/reactive Neuro: somnolent but will arouse  and move about but not follow commands or track  CV: rr, no murmur PULM:  Non labored on BiPAP getting TV ~450-500, coarse  GI: soft, +bs, flexiseal, non tender  Extremities: warm/dry, +1-2 generalized edema  Skin: no rashes  Resolved Hospital  Problem list   Variceal bleed, hemorrhagic shock s/p banding 1/20 AKI/ hyperkalemia  UTI, - s/p 7 days complete of ceftriaxone   Assessment & Plan:   Acute hypoxic respiratory failure Dysphagia  Pleural effusion  Probable aspiration pneumonia  Intubated for airway protection, acute encephalopathy likely 2/2 hepatic vs myxedema coma. Extubated 1/27, initially on BiPAP transitioned to [place].  completed 7 days of ceftriaxone for presumed PNA/ SBP ppx.  Failed SLP.  - ok to remain in PCU for now - high risk for intubation  - continue BiPAP - wean FiO2 for sat goal > 90% - NPO, aspiration precautions - CXR in am then prn  - ongoing SLP efforts but mental status has been a barrier - bedside ultrasound evaluation of pleural effusion showed very small pocket of fluid, not enough for thoracentesis; will diurese with lasix 40 mg x2 - empiric unaysn, pending PCT  - Ongoing GOC discussions   Acute encephalopathy  Likely 2/2 hepatic encephalopathy vs myxedema coma +/- sepsis (UTI on admit, presumed pneumonia). TSH was up to 51 on admission, down to 22.  - unclear etiology, remains non focal, seems to wax/ wane per bedside RN, was following commands this morning - Neurology consulted by [facility]  - ongoing lactulose and rifaximin, has been having good bowel movements/ has flexiseal - continue synthroid - continue neuro checks  - CBC, CMET, ammonia now  - abx as above    Cirrhosis 2/2 NASH - continue lactulose and rifaximin - lasix as above, consider spironolactone - recheck ammonia  Hypothyroidism  Noncompliant with medications. TSH 51 on admission. Repeat TSH 22.  -Continue synthroid 100 mcg daily -Repeat TSH outpatient    HTN - continue amlodipine   Best practice (evaluated daily)  Diet: TF on hold via cortrak Pain/Anxiety/Delirium protocol (if indicated): n/a VAP protocol (if indicated): n/a DVT prophylaxis: SCDs GI prophylaxis: PPI Glucose control: SSI Mobility:  bedrest Disposition: ok to remain in PCU for now Family communication: Daughter to be updated by phone by Dr. Salley Hews, ACNP [facility] 12/06/2020, 4:51 PM

## 2020-12-06 NOTE — Progress Notes (Signed)
PROGRESS NOTE  Kaylee King XLK:[id number] DOB: Jan 06, 1941   PCP: Fleet Contras, MD  Patient is from: Home  DOA: 11/23/2020 LOS: 13  Chief complaints: Altered mental status and GI bleed  Brief Narrative / Interim history: 80 year old female with PMH of prior alcohol abuse, cirrhosis, hypothyroidism and noncompliance presenting with coffee-ground hematemesis, hemorrhagic shock and encephalopathy and intubated for airway protection.  She was transfused 6 units of PRBC and 2 units of FFP while in ICU.  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 She also completed 7 days of IV ceftriaxone for presumed pneumonia.  Eventually extubated and transferred to [facility] service for ongoing care.  Patient was lethargic and somewhat somnolent after extubation.  CT head without acute finding. Encephalopathy presumed to be hepatic and she was treated with lactulose without improvement.  TSH was also elevated and she was started on Synthroid.  She had Cortrak placed due to dysphagia/poor p.o. intake in the setting of encephalopathy.     Subjective: Seen and examined earlier this morning.  No major events overnight or this morning.  Remains somnolent.  She wakes to voice.  Oriented x2 (self and place).  Saturating from 85 to 95% on 7 L.  Slightly tachypneic.  Follows some commands.  In the afternoon, rapid response called due to desaturation to 60s, and she was started on NRB.  VBG from earlier this morning consistent with chronic respiratory acidosis.  CXR with bibasilar opacities, Rt>Lt raising concern for infiltrate or atelectasis  Objective: Vitals:   12/06/20 0759 12/06/20 0831 12/06/20 1201 12/06/20 1500  BP: (!) 157/70 (!) 159/62 (!) 152/66 (!) 118/52  Pulse: 73 78 81 75  Resp: (!) 27 (!) 22 (!) 23 (!) 30  Temp: 97.6 F (36.4 C) 97.8 F (36.6 C) 98.6 F (37 C) (!) 97 F (36.1 C)  TempSrc: Axillary Axillary Oral Axillary  SpO2: 95% 93% 93% 100%  Weight:      Height:        Intake/Output  Summary (Last 24 hours) at 12/06/2020 1616 Last data filed at 12/06/2020 0700 Gross per 24 hour  Intake 469 ml  Output 1000 ml  Net -531 ml   Filed Weights   11/30/20 0500 12/01/20 0500 12/02/20 0500  Weight: 84.2 kg 77.6 kg 82.5 kg    Examination:  GENERAL: No apparent distress.  Nontoxic. HEENT: MMM.  Vision and hearing grossly intact.  NECK: Supple.  No apparent JVD.  RESP: 100% on NRB no IWOB.  Coarse breath sounds with upper airway sounds CVS:  RRR. Heart sounds normal.  ABD/GI/GU: BS+. Abd soft, NTND.  MSK/EXT:  Moves extremities. No apparent deformity. No edema.  Mittens over both arms. SKIN: no apparent skin lesion or wound NEURO: Somnolent but wakes to voice.  Oriented x0.  No apparent focal neuro deficit. PSYCH: Calm.  No distress or agitation.  Procedures:  Intubation and extubation  Microbiology summarized: COVID-19 PCR nonreactive.  Assessment & Plan: Acute metabolic encephalopathy-was concern about hepatic encephalopathy.  However, ammonia level within normal.  CT head without acute finding.  TSH 51.3 >>22.68.  Also history of alcohol abuse.  -Continue Synthroid 100 mcg daily -Reduce lactulose to 20 g twice daily-having a lot of stool output -Continue trial of high-dose thiamine -Reorientation and delirium precautions. -Neurology consulted.  Acute hypoxic hypercapnic respiratory failure-no history of COPD or asthma.  Worry about aspiration pneumonia given her encephalopathy and worsening chest x-ray.  ABG consistent with chronic respiratory acidosis.  BNP slightly elevated but better than  prior.  No fever or leukocytosis yet.  Patient is full code per discussion with patient's daughter. -Trial of BiPAP -Start IV Unasyn -Try nebulizer -Aspiration precautions. -PCCM consult  ABLA due to variceal bleed.  Patient with history of cirrhosis Recent Labs    11/28/20 0530 11/28/20 1543 11/29/20 0613 11/29/20 1645 11/30/20 0500 12/01/20 0536 12/02/20 1604  12/03/20 0629 12/05/20 0216 12/06/20 1556  HGB 6.5* 7.6* 7.3* 7.3* 7.3* 7.7* 8.0* 7.7* 8.0* 8.8*  -EGD on 1/20 showed Grade III varices (banded), clotted blood in the gastric fundus, type I GEV, nonbleeding GU x3 with no stigmata of bleeding. -Received about 6 units or pRBC and 2 units of FFP. -SCD for VTE prophylaxis -H. pylori IgG positive but H. pylori IgM negative-not sure if she has been treated previously -Continue PPI therapy   Hypothyroidism: TSH 51.3>> 22.62 -Continue Synthroid  Essential hypertension: BP within fair range. Continue amlodipine  UTI- treated with ceftriaxone   AKI: Resolved  Goal of care: Discussed with patient's daughter over the phone.  Patient remains encephalopathic.  Overall prognosis concerning.  Discussed about her CODE STATUS and the pros and cons of resuscitation and intubation.  Patient's daughter voiced understanding but wished to continue full code for now -Palliative care recommended outpatient follow-up  Feeding difficulties partly due to encephalopathy -SLP recommended n.p.o. -Continue enteral nutrition via nasogastric tube Body mass index is 31.22 kg/m. Nutrition Problem: Inadequate oral intake Etiology: acute illness Signs/Symptoms: NPO status Interventions: Tube feeding,Prostat   Pressure injury: Stage II sacral and stage II left elbow Pressure Injury 11/24/20 Sacrum Medial Stage 2 -  Partial thickness loss of dermis presenting as a shallow open injury with a red, pink wound bed without slough. non-blanchable red spot on boney area on sacurm with open skin. (Active)  11/24/20 1900  Location: Sacrum  Location Orientation: Medial  Staging: Stage 2 -  Partial thickness loss of dermis presenting as a shallow open injury with a red, pink wound bed without slough.  Wound Description (Comments): non-blanchable red spot on boney area on sacurm with open skin.  Present on Admission: No     Pressure Injury 11/29/20 Elbow Left;Posterior  Stage 2 -  Partial thickness loss of dermis presenting as a shallow open injury with a red, pink wound bed without slough. (Active)  11/29/20 1000  Location: Elbow  Location Orientation: Left;Posterior  Staging: Stage 2 -  Partial thickness loss of dermis presenting as a shallow open injury with a red, pink wound bed without slough.  Wound Description (Comments):   Present on Admission:    DVT prophylaxis:  SCDs Start: 11/23/20 1654  Code Status: Full code Family Communication: Updated patient's daughter over the phone. Level of care: Progressive Status is: Inpatient  Remains inpatient appropriate because:Altered mental status, Unsafe d/c plan, IV treatments appropriate due to intensity of illness or inability to take PO and Inpatient level of care appropriate due to severity of illness   Dispo:  Patient From: Home  Planned Disposition: Skilled Nursing Facility  Expected discharge date: 12/07/2020  Medically stable for discharge: No         Consultants:  PCCM Palliative medicine Neurology   Sch Meds:  Scheduled Meds: . amLODipine  5 mg Per NG tube Daily  . chlorhexidine  15 mL Mouth Rinse BID  . Chlorhexidine Gluconate Cloth  6 each Topical Daily  . feeding supplement (PROSource TF)  45 mL Per Tube Daily  . free water  200 mL Per Tube Q4H  . influenza  vaccine adjuvanted  0.5 mL Intramuscular Tomorrow-1000  . insulin aspart  0-24 Units Subcutaneous Q4H  . lactulose  20 g Per Tube BID  . levothyroxine  100 mcg Per Tube Q0600  . mouth rinse  15 mL Mouth Rinse q12n4p  . pantoprazole sodium  40 mg Per Tube BID  . rifaximin  550 mg Per Tube BID   Continuous Infusions: . sodium chloride Stopped (11/30/20 1413)  . ampicillin-sulbactam (UNASYN) IV    . feeding supplement (OSMOLITE 1.5 CAL) 1,000 mL (12/05/20 0828)  . thiamine injection 500 mg (12/05/20 1814)   PRN Meds:.sodium chloride  Antimicrobials: Anti-infectives (From admission, onward)   Start     Dose/Rate  Route Frequency Ordered Stop   12/06/20 1630  Ampicillin-Sulbactam (UNASYN) 3 g in sodium chloride 0.9 % 100 mL IVPB        3 g 200 mL/hr over 30 Minutes Intravenous Every 6 hours 12/06/20 1557     12/01/20 1000  rifaximin (XIFAXAN) tablet 550 mg        550 mg Per Tube 2 times daily 12/01/20 0903     11/24/20 1200  cefTRIAXone (ROCEPHIN) 2 g in sodium chloride 0.9 % 100 mL IVPB        2 g 200 mL/hr over 30 Minutes Intravenous Every 24 hours 11/24/20 1047 11/30/20 1305   11/23/20 1730  cefTRIAXone (ROCEPHIN) 2 g in sodium chloride 0.9 % 100 mL IVPB  Status:  Discontinued        2 g 200 mL/hr over 30 Minutes Intravenous Every 24 hours 11/23/20 1721 11/24/20 1047       I have personally reviewed the following labs and images: CBC: Recent Labs  Lab 11/30/20 0500 12/01/20 0536 12/02/20 1604 12/03/20 0629 12/05/20 0216 12/06/20 1556  WBC 10.6* 8.9 8.3 8.8 9.6  --   NEUTROABS  --   --   --   --  7.9*  --   HGB 7.3* 7.7* 8.0* 7.7* 8.0* 8.8*  HCT 24.1* 24.1* 25.4* 25.8* 26.0* 26.0*  MCV 93.8 94.1 94.8 97.0 98.5  --   PLT 129* 119* 120* 126* 166  --    BMP &GFR Recent Labs  Lab 11/30/20 0500 12/01/20 0536 12/01/20 0731 12/02/20 0718 12/02/20 1604 12/03/20 0629 12/05/20 0216 12/06/20 1556  NA 143 138 140  --  141 140 143 145  K 3.3* >7.5* 3.9  --  3.9 3.7 4.0 4.4  CL 104 106 101  --  104 103 106  --   CO2 29 28 28   --  27 27 25   --   GLUCOSE 142* 102* 105*  --  161* 155* 156*  --   BUN 20 18 17   --  20 18 20   --   CREATININE 0.89 0.93 0.95  --  0.83 0.80 0.71  --   CALCIUM 8.3* 8.4* 8.9  --  8.7* 8.6* 8.5*  --   MG 2.1 1.9  --  1.9  --   --  2.2  --   PHOS 2.6 2.6  --  2.7  --   --  2.7  --    Estimated Creatinine Clearance: 57.3 mL/min (by C-G formula based on SCr of 0.71 mg/dL). Liver & Pancreas: Recent Labs  Lab 12/02/20 1604  AST 106*  ALT 48*  ALKPHOS 143*  BILITOT 1.2  PROT 6.2*  ALBUMIN 2.7*   No results for input(s): LIPASE, AMYLASE in the last 168  hours. Recent Labs  Lab 12/05/20 0216  AMMONIA 27   Diabetic: No results for input(s): HGBA1C in the last 72 hours. Recent Labs  Lab 12/05/20 2016 12/05/20 2326 12/06/20 0338 12/06/20 0756 12/06/20 1155  GLUCAP 125* 138* 168* 125* 120*   Cardiac Enzymes: No results for input(s): CKTOTAL, CKMB, CKMBINDEX, TROPONINI in the last 168 hours. No results for input(s): PROBNP in the last 8760 hours. Coagulation Profile: No results for input(s): INR, PROTIME in the last 168 hours. Thyroid Function Tests: No results for input(s): TSH, T4TOTAL, FREET4, T3FREE, THYROIDAB in the last 72 hours. Lipid Profile: No results for input(s): CHOL, HDL, LDLCALC, TRIG, CHOLHDL, LDLDIRECT in the last 72 hours. Anemia Panel: Recent Labs    12/05/20 2032  VITAMINB12 1,540*   Urine analysis:    Component Value Date/Time   COLORURINE YELLOW 11/23/2020 1853   APPEARANCEUR CLOUDY (A) 11/23/2020 1853   LABSPEC 1.015 11/23/2020 1853   PHURINE 5.0 11/23/2020 1853   GLUCOSEU NEGATIVE 11/23/2020 1853   HGBUR NEGATIVE 11/23/2020 1853   BILIRUBINUR NEGATIVE 11/23/2020 1853   KETONESUR NEGATIVE 11/23/2020 1853   PROTEINUR 30 (A) 11/23/2020 1853   NITRITE NEGATIVE 11/23/2020 1853   LEUKOCYTESUR MODERATE (A) 11/23/2020 1853   Sepsis Labs: Invalid input(s): PROCALCITONIN, LACTICIDVEN  Microbiology: No results found for this or any previous visit (from the past 240 hour(s)).  Radiology Studies: DG Chest Port 1 View  Result Date: 12/06/2020 CLINICAL DATA:  Hypoxemia. EXAM: PORTABLE CHEST 1 VIEW COMPARISON:  November 29, 2020. FINDINGS: Stable cardiomegaly. No pneumothorax is noted. Feeding tube is seen entering stomach. Bibasilar opacities are noted concerning for atelectasis or infiltrates with associated pleural effusions. Bony thorax is unremarkable. IMPRESSION: Bibasilar opacities are noted concerning for atelectasis or infiltrates with associated pleural effusions. Electronically Signed   By: Lupita Raider M.D.   On: 12/06/2020 12:28      Taye T. Gonfa [facility]  If 7PM-7AM, please contact night-coverage [email or web address] 12/06/2020, 4:16 PM

## 2020-12-06 NOTE — Consult Note (Signed)
NEUROLOGY CONSULTATION NOTE   Date of service: December 06, 2020 Patient Name: Kaylee King MRN:  [id number] DOB:  1941-03-14 Reason for consult: "Persistent Encephalopathy" _ _ _   _ __   _ __ _ _  __ __   _ __   __ _  History of Present Illness  Kaylee King is a 80 y.o. female with PMH significant for EtOH use, Nash cirrhosis, hypothyroidism admitted with upper GI bleed from esophageal varices and cirrhosis.  She has been in the hospital for 13 days.  Was intubated and underwent banding of the varices and multiple blood transfusions.  Eventually extubated on 11/30/2020.  She has persistent ongoing encephalopathy and poor p.o. intake.  We are asked to evaluate this patient for neurological causes of her AMS.  Work-up for encephalopathy with CT head without contrast with no acute abnormalities.  Labs with significantly elevated TSH which is improved on Synthroid and is down to 22.  Vitamin B12 levels are elevated.  Thiamine levels are pending.  Ammonia level is normal.  Vitals today with hypercapnia on ABG with elevated bicarbonate.  She did have an episode of desaturations to the 60s and was started on a nonrebreather.  CXR with bibasilar opacities right greater than left raising concern for infiltrate or atelectasis.  No fever, she does have leukocytosis on CBC though.  She was started on ampicillin with sulbactam today.  Point-of-care glucose has been normal.  She is not on any significantly sedating medications or has taken them over the last couple days.  She was started on thiamine 500 mg daily starting today.  Labs with no significant electrolyte abnormality on her chemistry.  She does have transaminitis with low albumin and mildly elevated total bilirubin to 1.4.  Patient is encephalopathic and on Bipap. Unable to provide any meaningful hx. I spoke to the bedside nurse who also took care of the patient last night.  She reports that patient has waxing and waning mentation.  Last night she was  oriented to self and knew that she was at [facility].  Today she is more encephalopathic and unable to answer any orientation questions.   ROS   Unable to obtain due to encephalopathy.  Past History   Past Medical History:  Diagnosis Date  . Cirrhosis (HCC)   . Thyroid disease    hypothyroid    Past Surgical History:  Procedure Laterality Date  . ESOPHAGEAL BANDING  11/23/2020   Procedure: ESOPHAGEAL BANDING;  Surgeon: Beverley Fiedler, MD;  Location: [facility] ENDOSCOPY;  Service: Gastroenterology;;  . ESOPHAGOGASTRODUODENOSCOPY N/A 11/23/2020   Procedure: ESOPHAGOGASTRODUODENOSCOPY (EGD);  Surgeon: Beverley Fiedler, MD;  Location[facility] ENDOSCOPY;  Service: Gastroenterology;  Laterality: N/A;   History reviewed. No pertinent family history. Social History   Socioeconomic History  . Marital status: Legally Separated    Spouse name: Not on file  . Number of children: Not on file  . Years of education: Not on file  . Highest education level: Not on file  Occupational History  . Not on file  Tobacco Use  . Smoking status: Former Smoker    Quit date: 12/31/1981    Years since quitting: 38.9  . Smokeless tobacco: Never Used  Substance and Sexual Activity  . Alcohol use: Never  . Drug use: Never  . Sexual activity: Not on file  Other Topics Concern  . Not on file  Social History Narrative  . Not on file   Social Determinants of Health  Financial Resource Strain: Not on file  Food Insecurity: Not on file  Transportation Needs: Not on file  Physical Activity: Not on file  Stress: Not on file  Social Connections: Not on file   No Known Allergies  Medications   Medications Prior to Admission  Medication Sig Dispense Refill Last Dose  . aspirin-acetaminophen-caffeine (EXCEDRIN MIGRAINE) 250-250-65 MG tablet Take 2 tablets by mouth every 12 (twelve) hours as needed (pain).   11/22/2020  . naproxen sodium (ALEVE) 220 MG tablet Take 440 mg by mouth every 12 (twelve) hours as  needed (pain).   11/22/2020     Vitals   Vitals:   12/06/20 1500 12/06/20 1600 12/06/20 1612 12/06/20 1734  BP: (!) 118/52 (!) 155/64    Pulse: 75 80 87   Resp: (!) 30 (!) 28 (!) 25   Temp: (!) 97 F (36.1 C)     TempSrc: Axillary     SpO2: 100% 100% 100% 97%  Weight:      Height:         Body mass index is 31.22 kg/m.  Physical Exam   General: Laying comfortably in bed; in no acute distress. HENT: Normal oropharynx and mucosa. Normal external appearance of ears and nose. Bipap in place. Neck: Supple, no pain or tenderness CV: No JVD. No peripheral edema. Pulmonary: Symmetric Chest rise. Normal respiratory effort.  Abdomen: Soft to touch, non-tender. Ext: No cyanosis, edema, or deformity Skin: No rash. Normal palpation of skin.  Musculoskeletal: Normal digits and nails by inspection. No clubbing.  Neurologic Examination  Mental status/Cognition: Opens eyes to voice.  Tracks faces on both the left and the right.  Nods her head to answer questions.  Speech is very muffled due to BiPAP in place.  Unclear if she is oriented to herself. Speech/language: Muffled voice but that is probably due to BiPAP.  She does seem to follow very simple commands intermittently.  Cranial nerves:   CN II Pupils equal and reactive to light, no VF deficits   CN III,IV,VI EOM intact, no gaze preference or deviation, no nystagmus   CN V normal sensation in V1, V2, and V3 segments bilaterally   CN VII no asymmetry, no nasolabial fold flattening   CN VIII Turns head towards voice.   CN IX & X    CN XI    CN XII Midline tongue, does not protrude when asked.   Motor:  Muscle bulk: poor, tone increased extensor tone, tremor none She moves all extremities spontaneously.  Requires a lot of encouragement to get her to follow simple commands like wiggling her toes or lift her arms up.  All extremities drift down to the bed within 2-3 secs when held up in the air.  Reflexes:  Right Left Comments   Pectoralis      Biceps (C5/6) 2 2   Brachioradialis (C5/6) 2 2    Triceps (C6/7) 2 2    Patellar (L3/4) 2 2    Achilles (S1)      Hoffman      Plantar     Jaw jerk    Sensation:  Light touch Intact throughout   Pin prick    Temperature    Vibration   Proprioception    Coordination/Complex Motor:  Unable to assess coordination due to encephalopathy.  No obvious ataxia or tremor seen though.  Labs   CBC:  Recent Labs  Lab 12/05/20 0216 12/06/20 1556 12/06/20 1657  WBC 9.6  --  17.1*  NEUTROABS  7.9*  --   --   HGB 8.0* 8.8* 8.5*  HCT 26.0* 26.0* 29.9*  MCV 98.5  --  102.4*  PLT 166  --  241    Basic Metabolic Panel:  Lab Results  Component Value Date   NA 144 12/06/2020   K 4.3 12/06/2020   CO2 30 12/06/2020   GLUCOSE 155 (H) 12/06/2020   BUN 23 12/06/2020   CREATININE 0.72 12/06/2020   CALCIUM 8.8 (L) 12/06/2020   GFRNONAA >60 12/06/2020   Lipid Panel: No results found for: LDLCALC HgbA1c: No results found for: HGBA1C Urine Drug Screen:     Component Value Date/Time   LABOPIA POSITIVE (A) 11/23/2020 1853   COCAINSCRNUR NONE DETECTED 11/23/2020 1853   LABBENZ NONE DETECTED 11/23/2020 1853   AMPHETMU NONE DETECTED 11/23/2020 1853   THCU NONE DETECTED 11/23/2020 1853   LABBARB NONE DETECTED 11/23/2020 1853    Alcohol Level No results found for: ETH  CT Head without contrast: I personally reviewed CTH obtained on 11/23/20. It was negative for a large hypodensity concerning for a large territory infarct or hyperdensity concerning for an ICH  Impression   Kaylee King is a 80 y.o. female with PMH significant for with PMH significant for EtOH use, Nash cirrhosis, hypothyroidism admitted with upper GI bleed s/p banding of varices and extubated and on floor. Has waxing and waning encephalopathy and we were asked to assess for neurological causes of encephalopathy. Neuro exam with encephalopathy and slowed responses with no focal deficit. As for her recent  decline in mentation over the last day, I think that is most likely due to her respiratory status, specially given with new leukocytosis today. She does not have any neck rigidity, no fever, denies pain, negative brudzinski and kernigs sign on exam. Would recommend aggressive management of underlying infections and metabolic abnormalities including hypothyroidism. Her Vit B12 levels are high, Ammonia is normal and I do not think that is contributing to this. I also think that part of this is due to delirium. She is high risk for it given her advanced age and prolonged hospitalization.  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 There are no downsides or side effects from Thiamine replacement. Can swtich to PO 100mg  daily at discharge. - Recommend Multivitamin and Multimineral tablet daily. - Agree with aggressive management of underlying potential respiratory infection and hypothyroidism.  - I ordered delirium precautions and low dose melatonin 3mg  qhs. - If her mentation fails to improve or worsens over the next couple of days despite aggressive treatment of infection and hypothyroidism, can consider MRI Brain without contrast at that time along with a routine EEG. ______________________________________________________________________   Thank you for the opportunity to take part in the care of this patient. If you have any further questions, please contact the neurology consultation attending.  Signed,  [facility] Pager Number _ _ _   _ __   _ __ _ _  __ __   _ __   __ _

## 2020-12-06 NOTE — Progress Notes (Signed)
Pt O2 sat reading mid 60's but with a bad wave form. RN switched pulse ox probe location to receive a better reading. O2 sat then reading mid 80's with a good waveform. RN increased O2 to 8L per minute with no change. Pt O2 sat down to 77. RN placed Pt on no rebreather. Pt's O2 sat steadily came up to 83% and held for a few minutes. Rapid response RN notified and O2 sat up to 91%. O2 now reads at 100% with no rebreather still on. Pt responsive and attempting to remove no rebreather. If removed Pt begins desating rather quickly.

## 2020-12-06 NOTE — Significant Event (Signed)
Rapid Response Event Note   Reason for Call :  Patient with desat event earlier in the afternoon while on [place].  Improved after placing on NRB O2 sats 100%.  Patient has had altered mental status since extubation she is at best O x 2 and other times difficult to arouse.   ABG 7.3/67/118/33 on NRB  Initial Focused Assessment:  Patient arouses to voice but does not follow commands. RT is placing patient on Bipap  18/9 and 50% Fio2 Per RN TF held about 1415  BP 155/64    SR 86  RR 28-34  O2 sat 96% on Bipap  Dr Katrinka Blazing at bedside to assess patient  Interventions:    Plan of Care:  Bipap  Diuresis Reassess   Event Summary:   MD Notified: Dr Alanda Slim Call Time: Arrival Time: End Time:  Marcellina Millin, RN

## 2020-12-06 NOTE — Progress Notes (Signed)
Pt placed on BIPAP 18/6 per ABG results. MD aware.

## 2020-12-06 NOTE — Progress Notes (Signed)
TF held at this time per MD.

## 2020-12-06 NOTE — Progress Notes (Signed)
Pharmacy Antibiotic Note  Kaylee King is a 80 y.o. female with PMH of alcohol abuse, cirrhosis and hypothyroidism was admitted on 11/23/2020 with acute metabolic encephalopathy and GI bleed.  Pharmacy has been consulted for Unasyn dosing for aspiration pneumonia.  WBC 9.6, afebrile; Scr 0.71, CrCl 57.3 ml/min (renal function stable)  Plan: Unasyn 3 gm IV Q 6 hrs Monitor WBC, temp, clinical improvement, renal function  Height: 5\' 4"  (162.6 cm) Weight: 82.5 kg (181 lb 14.1 oz) IBW/kg (Calculated) : 54.7  Temp (24hrs), Avg:97.9 F (36.6 C), Min:97 F (36.1 C), Max:98.6 F (37 C)  Recent Labs  Lab 11/30/20 0500 12/01/20 0536 12/01/20 0731 12/02/20 1604 12/03/20 0629 12/05/20 0216  WBC 10.6* 8.9  --  8.3 8.8 9.6  CREATININE 0.89 0.93 0.95 0.83 0.80 0.71    Estimated Creatinine Clearance: 57.3 mL/min (by C-G formula based on SCr of 0.71 mg/dL).    No Known Allergies  Antimicrobials this admission: Ceftriaxone 1/21-1/27 Rifaximin 1/28 >> Unasyn 2/2 >>  Microbiology results: 1/20 UCx: E coli, Morganella marganii 1/20  MRSA PCR: negative 1/20 COVID: negative 2/1 RPR: nonreactive  Thank you for allowing pharmacy to be a part of this patient's care.  2/20, PharmD, BCPS, [facility] Pharmacist 12/06/2020 3:46 PM

## 2020-12-07 DIAGNOSIS — E870 Hyperosmolality and hypernatremia: Secondary | ICD-10-CM

## 2020-12-07 DIAGNOSIS — L89022 Pressure ulcer of left elbow, stage 2: Secondary | ICD-10-CM | POA: Diagnosis not present

## 2020-12-07 DIAGNOSIS — E039 Hypothyroidism, unspecified: Secondary | ICD-10-CM | POA: Diagnosis not present

## 2020-12-07 DIAGNOSIS — L89152 Pressure ulcer of sacral region, stage 2: Secondary | ICD-10-CM | POA: Diagnosis not present

## 2020-12-07 DIAGNOSIS — G9341 Metabolic encephalopathy: Secondary | ICD-10-CM | POA: Diagnosis not present

## 2020-12-07 DIAGNOSIS — J690 Pneumonitis due to inhalation of food and vomit: Secondary | ICD-10-CM

## 2020-12-07 DIAGNOSIS — J9601 Acute respiratory failure with hypoxia: Secondary | ICD-10-CM | POA: Diagnosis not present

## 2020-12-07 LAB — POCT I-STAT 7, (LYTES, BLD GAS, ICA,H+H)
Acid-Base Excess: 10 mmol/L — ABNORMAL HIGH (ref 0.0–2.0)
Bicarbonate: 34.9 mmol/L — ABNORMAL HIGH (ref 20.0–28.0)
Calcium, Ion: 1.2 mmol/L (ref 1.15–1.40)
HCT: 20 % — ABNORMAL LOW (ref 36.0–46.0)
Hemoglobin: 6.8 g/dL — CL (ref 12.0–15.0)
O2 Saturation: 99 %
Potassium: 3.6 mmol/L (ref 3.5–5.1)
Sodium: 147 mmol/L — ABNORMAL HIGH (ref 135–145)
TCO2: 36 mmol/L — ABNORMAL HIGH (ref 22–32)
pCO2 arterial: 47.4 mmHg (ref 32.0–48.0)
pH, Arterial: 7.475 — ABNORMAL HIGH (ref 7.350–7.450)
pO2, Arterial: 130 mmHg — ABNORMAL HIGH (ref 83.0–108.0)

## 2020-12-07 LAB — COMPREHENSIVE METABOLIC PANEL
ALT: 88 U/L — ABNORMAL HIGH (ref 0–44)
AST: 134 U/L — ABNORMAL HIGH (ref 15–41)
Albumin: 2.5 g/dL — ABNORMAL LOW (ref 3.5–5.0)
Alkaline Phosphatase: 186 U/L — ABNORMAL HIGH (ref 38–126)
Anion gap: 10 (ref 5–15)
BUN: 26 mg/dL — ABNORMAL HIGH (ref 8–23)
CO2: 31 mmol/L (ref 22–32)
Calcium: 8.5 mg/dL — ABNORMAL LOW (ref 8.9–10.3)
Chloride: 105 mmol/L (ref 98–111)
Creatinine, Ser: 0.94 mg/dL (ref 0.44–1.00)
GFR, Estimated: >60 mL/min (ref >60)
Glucose, Bld: 103 mg/dL — ABNORMAL HIGH (ref 70–99)
Potassium: 4.1 mmol/L (ref 3.5–5.1)
Sodium: 146 mmol/L — ABNORMAL HIGH (ref 135–145)
Total Bilirubin: 1.5 mg/dL — ABNORMAL HIGH (ref 0.3–1.2)
Total Protein: 6.2 g/dL — ABNORMAL LOW (ref 6.5–8.1)

## 2020-12-07 LAB — GLUCOSE, CAPILLARY
Glucose-Capillary: 89 mg/dL (ref 70–99)
Glucose-Capillary: 95 mg/dL (ref 70–99)
Glucose-Capillary: 95 mg/dL (ref 70–99)
Glucose-Capillary: 100 mg/dL — ABNORMAL HIGH (ref 70–99)
Glucose-Capillary: 87 mg/dL (ref 70–99)

## 2020-12-07 LAB — CBC
HCT: 24.1 % — ABNORMAL LOW (ref 36.0–46.0)
Hemoglobin: 7.4 g/dL — ABNORMAL LOW (ref 12.0–15.0)
MCH: 30.1 pg (ref 26.0–34.0)
MCHC: 30.7 g/dL (ref 30.0–36.0)
MCV: 98 fL (ref 80.0–100.0)
Platelets: 221 10*3/uL (ref 150–400)
RBC: 2.46 MIL/uL — ABNORMAL LOW (ref 3.87–5.11)
RDW: 18.5 % — ABNORMAL HIGH (ref 11.5–15.5)
WBC: 10 10*3/uL (ref 4.0–10.5)
nRBC: 0.5 % — ABNORMAL HIGH (ref 0.0–0.2)

## 2020-12-07 LAB — MAGNESIUM: Magnesium: 2.3 mg/dL (ref 1.7–2.4)

## 2020-12-07 LAB — PHOSPHORUS: Phosphorus: 3.5 mg/dL (ref 2.5–4.6)

## 2020-12-07 LAB — PROCALCITONIN: Procalcitonin: 0.2 ng/mL

## 2020-12-07 MED ORDER — SPIRONOLACTONE 25 MG PO TABS
25.0000 mg | ORAL_TABLET | Freq: Every day | ORAL | Status: DC
  Administered 2020-12-07: 25 mg via ORAL
  Filled 2020-12-07: qty 1

## 2020-12-07 MED ORDER — DEXTROSE-NACL 5-0.2 % IV SOLN: INTRAVENOUS | Status: DC

## 2020-12-07 MED ORDER — ACETAMINOPHEN 160 MG/5ML PO SOLN
650.0000 mg | Freq: Three times a day (TID) | ORAL | Status: DC | PRN
  Administered 2020-12-07 – 2020-12-13 (×8): 650 mg
  Filled 2020-12-07 (×8): qty 20.3

## 2020-12-07 MED ORDER — SODIUM CHLORIDE 0.9 % IV BOLUS
500.0000 mL | Freq: Once | INTRAVENOUS | Status: AC
  Administered 2020-12-07: 500 mL via INTRAVENOUS

## 2020-12-07 MED ORDER — IPRATROPIUM-ALBUTEROL 0.5-2.5 (3) MG/3ML IN SOLN: 3.0000 mL | RESPIRATORY_TRACT | Status: DC | PRN

## 2020-12-07 MED ORDER — FUROSEMIDE 10 MG/ML IJ SOLN
40.0000 mg | Freq: Once | INTRAMUSCULAR | Status: AC
  Administered 2020-12-07: 40 mg via INTRAVENOUS
  Filled 2020-12-07: qty 4

## 2020-12-07 MED ORDER — MELATONIN 3 MG PO TABS
3.0000 mg | ORAL_TABLET | Freq: Every day | ORAL | Status: DC
  Administered 2020-12-07 – 2020-12-12 (×6): 3 mg
  Filled 2020-12-07 (×6): qty 1

## 2020-12-07 MED ORDER — FUROSEMIDE 10 MG/ML IJ SOLN
INTRAMUSCULAR | Status: AC
  Filled 2020-12-07: qty 4

## 2020-12-07 NOTE — Progress Notes (Signed)
Pt taken off BIPAP and placed on 6L HFNC per ABG results. MD ,made aware of ABG results.

## 2020-12-07 NOTE — Progress Notes (Signed)
Occupational Therapy Treatment Patient Details Name: Kaylee King MRN: [id number] DOB: 11-21-1940 Today's Date: 12/07/2020    History of present illness 80 year old female with history of hypothyroidism, medical noncompliance, NASH cirrhosis, presents to [facility] on 1/20 with progressive headaches, fatigue, confusion, and hypotension of unclear etiology. In the ED, had coffee ground/maroon colored hematemesis and was intubated for airway protection. CT head 1/20 was negative. Pt found to have upper GI bleed from esophageal varices and is followed by GI. EDG 1/20- Grade III esophageal varices, banded x6. Type I GE varices without bleeding. Nonbleeding gastric ulcers x3. ETT 1/20-1/27.   OT comments  Pt continues to present with poor balance, strength, cognition, and activity tolerance. Upon arrival, pt supine and moaning. Pt requiring Total A for oral care at bed level.  Pt requiring Total A for achieving upright sitting posture at EOB; able to sit at EOB with Min Guard A but benefiting from physical support as she fatigues quickly. Pt returning to supine with Min-Mod A. Continue to recommendation dc to SNF and consult from palliative. Will continue to follow acutely as admitted.   BP 136/67 (88) in supine prior to start of session; increased to 220/179 (192) in sitting; remained 209/189 (197) when returned to supine, RN alerted.  SpO2 89-91% on 4L [place]   Follow Up Recommendations  SNF    Equipment Recommendations  Wheelchair (measurements OT);Wheelchair cushion (measurements OT);Hospital bed;Other (comment) Michiel Sites)    Recommendations for Other Services Other (comment) (Palliative)    Precautions / Restrictions Precautions Precautions: Fall Precaution Comments: flexiseal, cortrak, mitts Restrictions Weight Bearing Restrictions: No       Mobility Bed Mobility Overal bed mobility: Needs Assistance Bed Mobility: Supine to Sit;Sit to Supine;Rolling Rolling: Mod assist   Supine to sit: Total  assist;+2 for physical assistance;+2 for safety/equipment Sit to supine: Max assist;+2 for physical assistance;+2 for safety/equipment Sit to sidelying: Min assist General bed mobility comments: total +2 all aspects of bed mobility and transition to sitting. pt not resistive at this time, and able to follow commands to initiate return to supine, but needed maxA of 2 to complete movement of trunk and LE. Able to complete safe roll with  managing BLEs and modA  Transfers                 General transfer comment: defer due to fatigue and elevated BP    Balance Overall balance assessment: Needs assistance Sitting-balance support: Feet supported Sitting balance-Leahy Scale: Poor Sitting balance - Comments: Able to achieve still with MIn Guard A. But fatigues quickly. Benefits from support to increase sitting tolerance at EOB Postural control: Posterior lean;Left lateral lean                                 ADL either performed or assessed with clinical judgement   ADL Overall ADL's : Needs assistance/impaired     Grooming: Oral care;Bed level;Total assistance Grooming Details (indicate cue type and reason): Total A for oral care. Attempting hand over hand but pt avoiding oral care and pushing away                               General ADL Comments: Pt presenting with decreased balance, strength, and activity tolerance.     Vision       Perception     Praxis      Cognition Arousal/Alertness:  Awake/alert Behavior During Therapy: Flat affect (sad) Overall Cognitive Status: Difficult to assess Area of Impairment: Orientation;Attention;Memory;Following commands;Safety/judgement;Awareness;Problem solving                 Orientation Level: Disoriented to;Time;Situation Current Attention Level: Focused Memory: Decreased short-term memory;Decreased recall of precautions Following Commands: Follows one step commands with increased time;Follows  one step commands inconsistently Safety/Judgement: Decreased awareness of safety;Decreased awareness of deficits Awareness: Intellectual Problem Solving: Slow processing;Decreased initiation;Difficulty sequencing;Requires verbal cues;Requires tactile cues General Comments: Pt awake and moaning upon arrival. Decreased participation and engagement. Verbalizing sentences such as "I want to go home". Pt fatigues quickly        Exercises Exercises: General Lower Extremity General Exercises - Lower Extremity Long Arc Quad: AAROM;Both;10 reps;Seated   Shoulder Instructions       General Comments BP 136/67 (88) in supine prior to start of session; increased to 220/179 (192) in sitting; remained 209/189 (197) when returned to supine, RN alerted. SpO2 89-91% on 4L [place]    Pertinent Vitals/ Pain       Pain Assessment: Faces Faces Pain Scale: Hurts even more Pain Location: Generalized Pain Descriptors / Indicators: Grimacing;Discomfort;Guarding Pain Intervention(s): Limited activity within patient's tolerance;Monitored during session;Repositioned  Home Living                                          Prior Functioning/Environment              Frequency  Min 2X/week        Progress Toward Goals  OT Goals(current goals can now be found in the care plan section)  Progress towards OT goals: Not progressing toward goals - comment  Acute Rehab OT Goals Patient Stated Goal: to go home OT Goal Formulation: Patient unable to participate in goal setting Potential to Achieve Goals: Fair ADL Goals Additional ADL Goal #1: pt will tolerate palm guards for 4 hours Additional ADL Goal #2: pt will complete bed mobility total +2 mod (A) Additional ADL Goal #3: pt will demonstates sustained attention for adls task  Plan Discharge plan remains appropriate;Frequency remains appropriate    Co-evaluation    PT/OT/SLP Co-Evaluation/Treatment: Yes Reason for Co-Treatment: For  patient/therapist safety;To address functional/ADL transfers PT goals addressed during session: Mobility/safety with mobility;Balance OT goals addressed during session: ADL's and self-care      AM-PAC OT "6 Clicks" Daily Activity     Outcome Measure   Help from another person eating meals?: Total Help from another person taking care of personal grooming?: Total Help from another person toileting, which includes using toliet, bedpan, or urinal?: Total Help from another person bathing (including washing, rinsing, drying)?: Total Help from another person to put on and taking off regular upper body clothing?: Total Help from another person to put on and taking off regular lower body clothing?: Total 6 Click Score: 6    End of Session Equipment Utilized During Treatment: Oxygen  OT Visit Diagnosis: Unsteadiness on feet (R26.81);Muscle weakness (generalized) (M62.81)   Activity Tolerance Patient tolerated treatment well   Patient Left in bed;with call bell/phone within reach;with bed alarm set   Nurse Communication Mobility status        Time: 5621-3086 OT Time Calculation (min): 26 min  Charges: OT General Charges $OT Visit: 1 Visit OT Treatments $Self Care/Home Management : 8-22 mins  Deante Blough MSOT, OTR/L Acute Rehab Pager: [phone] Office:  [phone]   Theodoro Grist Lonnie Rosado 12/07/2020, 2:25 PM

## 2020-12-07 NOTE — Progress Notes (Signed)
Physical Therapy Treatment Patient Details Name: Kaylee King MRN: [id number] DOB: 1941/04/26 Today's Date: 12/07/2020    History of Present Illness 80 year old female with history of hypothyroidism, medical noncompliance, NASH cirrhosis, presents to [facility] on 1/20 with progressive headaches, fatigue, confusion, and hypotension of unclear etiology. In the ED, had coffee ground/maroon colored hematemesis and was intubated for airway protection. CT head 1/20 was negative. Pt found to have upper GI bleed from esophageal varices and is followed by GI. EDG 1/20- Grade III esophageal varices, banded x6. Type I GE varices without bleeding. Nonbleeding gastric ulcers x3. ETT 1/20-1/27.    PT Comments    Pt seen by PT/OT with goal of improved seated tolerance and improved seated stability. The pt was able to demo improvements in bed mobility and rolling, but continues to require significant assist to manage BLE movements and mod/maxA to maintain seated balance due to strong posterior and L lean. The pt was able to tolerate short bout of AAROM to BLE while in seated position, but was unable to progress to transfers OOB or further exercises at this time due to fatigue and reports of dizziness. Pt BP increased from 136/67 (88) in supine prior to session to 220/179 (192) after sitting EOB for 5-8 min. Pt will continue to benefit from skilled PT to progress functional strength and activity tolerance.     Follow Up Recommendations  SNF;Supervision/Assistance - 24 hour     Equipment Recommendations  Other (comment) (tbd)    Recommendations for Other Services       Precautions / Restrictions Precautions Precautions: Fall Precaution Comments: flexiseal, cortrak, mitts Restrictions Weight Bearing Restrictions: No    Mobility  Bed Mobility Overal bed mobility: Needs Assistance Bed Mobility: Supine to Sit;Sit to Supine;Rolling Rolling: Mod assist   Supine to sit: Total assist;+2 for physical assistance;+2  for safety/equipment Sit to supine: Max assist;+2 for physical assistance;+2 for safety/equipment Sit to sidelying: Min assist General bed mobility comments: total +2 all aspects of bed mobility and transition to sitting. pt not resistive at this time, and able to follow commands to initiate return to supine, but needed maxA of 2 to complete movement of trunk and LE. Able to complete safe roll with  managing BLEs and modA  Transfers                 General transfer comment: defer due to fatigue and elevated BP     Balance Overall balance assessment: Needs assistance Sitting-balance support: Feet supported Sitting balance-Leahy Scale: Poor Sitting balance - Comments: Able to achieve still with MIn Guard A. But fatigues quickly. Benefits from support to increase sitting tolerance at EOB Postural control: Posterior lean;Left lateral lean                                  Cognition Arousal/Alertness: Awake/alert Behavior During Therapy: Flat affect (sad) Overall Cognitive Status: Difficult to assess Area of Impairment: Orientation;Attention;Memory;Following commands;Safety/judgement;Awareness;Problem solving                 Orientation Level: Disoriented to;Time;Situation Current Attention Level: Focused Memory: Decreased short-term memory;Decreased recall of precautions Following Commands: Follows one step commands with increased time;Follows one step commands inconsistently Safety/Judgement: Decreased awareness of safety;Decreased awareness of deficits Awareness: Intellectual Problem Solving: Slow processing;Decreased initiation;Difficulty sequencing;Requires verbal cues;Requires tactile cues General Comments: Pt awake and moaning upon arrival. Decreased participation and engagement. Verbalizing sentences such as "I want to go home".  Pt fatigues quickly      Exercises General Exercises - Lower Extremity Long Arc Quad: AAROM;Both;10 reps;Seated     General Comments General comments (skin integrity, edema, etc.): BP 136/67 (88) in supine prior to start of session; increased to 220/179 (192) in sitting; remained 209/189 (197) when returned to supine, RN alerted. SpO2 89-91% on 4L [place]      Pertinent Vitals/Pain Pain Assessment: Faces Faces Pain Scale: Hurts even more Pain Location: Generalized Pain Descriptors / Indicators: Grimacing;Discomfort;Guarding Pain Intervention(s): Limited activity within patient's tolerance;Monitored during session;Repositioned           PT Goals (current goals can now be found in the care plan section) Acute Rehab PT Goals Patient Stated Goal: to go home PT Goal Formulation: Patient unable to participate in goal setting Time For Goal Achievement: 12/15/20 Potential to Achieve Goals: Fair Progress towards PT goals: Progressing toward goals (slowly)    Frequency    Min 2X/week      PT Plan Current plan remains appropriate    Co-evaluation PT/OT/SLP Co-Evaluation/Treatment: Yes Reason for Co-Treatment: For patient/therapist safety;To address functional/ADL transfers PT goals addressed during session: Mobility/safety with mobility;Balance OT goals addressed during session: ADL's and self-care      AM-PAC PT "6 Clicks" Mobility   Outcome Measure  Help needed turning from your back to your side while in a flat bed without using bedrails?: Total Help needed moving from lying on your back to sitting on the side of a flat bed without using bedrails?: Total Help needed moving to and from a bed to a chair (including a wheelchair)?: Total Help needed standing up from a chair using your arms (e.g., wheelchair or bedside chair)?: Total Help needed to walk in hospital room?: Total Help needed climbing 3-5 steps with a railing? : Total 6 Click Score: 6    End of Session Equipment Utilized During Treatment: Oxygen Activity Tolerance: Patient limited by fatigue Patient left: in bed;with call  bell/phone within reach;with bed alarm set;with restraints reapplied;with SCD's reapplied;with nursing/sitter in room Nurse Communication: Mobility status (elevated BP) PT Visit Diagnosis: Other abnormalities of gait and mobility (R26.89);Difficulty in walking, not elsewhere classified (R26.2)     Time: 6948-5462 PT Time Calculation (min) (ACUTE ONLY): 29 min  Charges:  $Therapeutic Activity: 8-22 mins                     Rolm Baptise, PT, DPT   Acute Rehabilitation Department Pager #: [phone]   Gaetana Michaelis 12/07/2020, 2:25 PM

## 2020-12-07 NOTE — Progress Notes (Signed)
SLP Cancellation Note  Patient Details Name: Kaylee King MRN: [id number] DOB: Nov 28, 1940   Cancelled treatment:       Reason Eval/Treat Not Completed: Patient at procedure or test/unavailable - pt currently with PT/OT. Will continue efforts.  Kaylee King B. Murvin Natal, [facility], CCC-SLP Speech Language Pathologist Office: [phone] Pager: [phone]  Kaylee King 12/07/2020, 11:23 AM

## 2020-12-07 NOTE — Progress Notes (Signed)
[facility] ([facility]) Community Based Palliative Care       This patient has been referred to our palliative care services in the community.  [facility] will continue to follow for any discharge planning needs and to coordinate admission onto palliative care.    Thank you for the opportunity to participate in this patient's care.     Chrislyn King, BSN, RN [facility] Hospital Liaison   [phone] (24h on call) 

## 2020-12-07 NOTE — Progress Notes (Signed)
NAME:  Kaylee King, MRN:  [id number], DOB:  November 21, 1940, LOS: 14 ADMISSION DATE:  11/23/2020, CONSULTATION DATE:  11/23/20 REFERRING MD:  Bernette Mayers CHIEF COMPLAINT:  AMS   Brief History:  60 yoF with history of NASH cirrhosis and hypothyroidism noncompliant admitted with GIB, hemorrhagic shock, and encephalopathy presumed secondary to hepatic/ metabolic with elevated TSH, UTI, presumed pna requiring intubation and aggressive resuscitation.  Underwent EGD and varices banded.  Extubated and transferred to [facility] on 1/29 but has remained encephalopathic without clear etiology.  On 2/2 patient had desaturation on her Merom requiring RRT and NRB, placed on BiPAP.  PCCM consulted for evaluation and possible transfer back to ICU.   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 1/25 transfused 1 unit of packed red blood cells 1/29 tx to [facility] service 2/2 PCCM reconsult for altered mental status and respiratory distress  Consults:  PCCM GI 1/20 PMT 1/29 Neurology 2/2  Procedures:  Central venous cath 1/20 >> 1/28 ETT  1/20>> 1/27 cortrak 1/24 >>  Significant Diagnostic Tests:  1/20 CT Head >> 1. No acute intracranial findings. 2. Periventricular white matter and corona radiata hypodensities favor chronic ischemic microvascular white matter disease.  CXR basilar atelectasis versus possible pneumonia (aspiration?)  EDG 1/20- Grade III esophageal varices, banded x6. Type I GE varices without bleeding. Nonbleeding gastric ulcers x3.   Micro Data:  1/20 SARS >> neg 1/20 MRSA  >> neg 1/20 UC >> e. Coli, morganella   Antimicrobials:  Ceftriaxone 1/20 >> 1/27 Unasyn 2/2  >>  Rifaximin 1/28 >> Interim History / Subjective:   Speaking to me some Abg performed this morning Off BIPAP Lying in bed Minimal urine output reported  Objective   Blood pressure (!) 100/56, pulse 70, temperature 98.6 F (37 C), temperature source Axillary, resp. rate 16, height 5\' 4"  (1.626 m), weight 82.5 kg, SpO2 100 %.    FiO2 (%):  [50 %] 50 %   Intake/Output Summary (Last 24 hours) at 12/07/2020 1044 Last data filed at 12/07/2020 0700 Gross per 24 hour  Intake 471.14 ml  Output 1500 ml  Net -1028.86 ml   Filed Weights   11/30/20 0500 12/01/20 0500 12/02/20 0500  Weight: 84.2 kg 77.6 kg 82.5 kg   Examination:  General:  Frail elderly female lying flat in bed HENT: NCAT OP clear PULM: CTA bilaterally today, normal effort CV: RRR, no mgr GI: BS+, soft, nontender MSK: normal bulk and tone Neuro: awake, moaning but able to focus on me, tells me she is in a hospital and she feels OK, moves all four extremities well   Resolved Hospital Problem list   Variceal bleed, hemorrhagic shock s/p banding 1/20 AKI/ hyperkalemia  UTI, - s/p 7 days complete of ceftriaxone   Assessment & Plan:   Acute hypoxic respiratory failure > improved overnight after furosemide and unasyn Dysphagia  Pleural effusion, small Doubt aspiration pneumonia Looks better on 2/3, but still clearly not returned to baseline or ready for discharge Lasix again x1 Stop unasyn Wean off O2 to maintain O2 saturation > 88%  Acute hepatic encephalopathy  Continue rifaximin, lactulose   Cirrhosis (unknown ascites), varices 2/2 NASH; poor prognosis Lactulose, rifaximin Will add spironolactone Consider GI consult to discuss  prognosis  PCCM will sign off  Best practice (evaluated daily)  Diet: TF on hold via cortrak Pain/Anxiety/Delirium protocol (if indicated): n/a VAP protocol (if indicated): n/a DVT prophylaxis: SCDs GI prophylaxis: PPI Glucose control: SSI Mobility: bedrest Disposition: ok  to remain in PCU for now Family communication: per [facility]       Heber Wallace, MD [place] [facility]M Pager: [phone] Cell: [phone] If no response, call [phone]

## 2020-12-07 NOTE — Progress Notes (Signed)
PROGRESS NOTE  Kaylee King ZOX:[id number] DOB: 07/12/1941   PCP: Fleet Contras, MD  Patient is from: Home  DOA: 11/23/2020 LOS: 14  Chief complaints: Altered mental status and GI bleed  Brief Narrative / Interim history: 80 year old female with PMH of prior alcohol abuse, cirrhosis, hypothyroidism and noncompliance presenting with coffee-ground hematemesis, hemorrhagic shock and encephalopathy and intubated for airway protection.  She was transfused 6 units of PRBC and 2 units of FFP while in ICU.  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 She also completed 7 days of IV ceftriaxone for presumed pneumonia.  Eventually extubated and transferred to [facility] service for ongoing care.  Patient was lethargic and somewhat somnolent after extubation.  CT head without acute finding. Encephalopathy presumed to be hepatic and she was treated with lactulose without improvement.  TSH was also elevated and she was started on Synthroid.  She had Cortrak placed due to dysphagia/poor p.o. intake in the setting of encephalopathy.    Patient desaturated to 60s on 7L requiring NRB. ABG with respiratory acidosis and hypercapnia.  CXR concerning for bibasilar opacities likely infiltrate raising concern for aspiration pneumonia.  Started on BiPAP and IV Unasyn.  CCM consulted for RF.    Subjective: Seen and examined earlier this morning.  Slightly hypotensive earlier this morning.  ABG now with respiratory alkalosis.  She remains confused and disoriented.  Objective: Vitals:   12/07/20 0800 12/07/20 0901 12/07/20 1059 12/07/20 1100  BP:   129/61 (!) 141/62  Pulse:  70  82  Resp:  16  20  Temp: 98.6 F (37 C)   99 F (37.2 C)  TempSrc: Axillary   Axillary  SpO2:  100%  91%  Weight:      Height:        Intake/Output Summary (Last 24 hours) at 12/07/2020 1303 Last data filed at 12/07/2020 0700 Gross per 24 hour  Intake 471.14 ml  Output 1500 ml  Net -1028.86 ml   Filed Weights   11/30/20 0500  12/01/20 0500 12/02/20 0500  Weight: 84.2 kg 77.6 kg 82.5 kg    Examination:  GENERAL: Confused.  Somewhat restless. HEENT: MMM.  Vision and hearing grossly intact.  NECK: Supple.  No apparent JVD.  RESP: 95% on 5 L.  No IWOB.  Coarse breath sounds with upper airway sounds. CVS:  RRR. Heart sounds normal.  ABD/GI/GU: BS+. Abd soft, NTND.  MSK/EXT:  Moves extremities. No apparent deformity. No edema.  SKIN: no apparent skin lesion or wound NEURO: Awake but disoriented.  Does not follows command.  No apparent focal neuro deficit but limited exam. PSYCH: Somewhat restless.  Procedures:  Intubation and extubation  Microbiology summarized: COVID-19 PCR nonreactive.  Assessment & Plan: Acute metabolic encephalopathy-was concern about hepatic encephalopathy.  However, ammonia level within normal.  CT head without acute finding.  TSH 51.3 >>22.68.  Also history of alcohol abuse.  -Continue Synthroid 100 mcg daily -Reduced lactulose to 20 g twice daily -On [place]ke dose thiamine per neurology. -Reorientation and delirium precautions. -Neurology following.  Acute hypoxic hypercapnic respiratory failure likely due to aspiration pneumonia-no history of COPD or asthma.  Hypercapnia resolved.  -Wean off BiPAP to nasal cannula-minimum oxygen to keep saturation above 90% -Continue IV Unasyn and nebulizers. -Aspiration precautions. -PCCM following.  ABLA due to variceal bleed.  Patient with history of cirrhosis Recent Labs    11/29/20 1645 11/30/20 0500 12/01/20 0536 12/02/20 1604 12/03/20 8119 12/05/20 0216 12/06/20 1556 12/06/20 1657 12/07/20 0421 12/07/20 1478  HGB 7.3* 7.3* 7.7* 8.0* 7.7* 8.0* 8.8* 8.5* 7.4* 6.8*  -EGD on 1/20 showed Grade III varices (banded), clotted blood in the gastric fundus, type I GEV, nonbleeding GU x3 with no stigmata of bleeding. -Received about 6u or pRBC and 2u of FFP. -SCD for VTE prophylaxis -H. pylori IgG positive but H. pylori IgM negative-not  sure if she has been treated previously -Continue PPI therapy   Hypernatremia: Na 146>>147 -We may be able to resume free water.  Hypothyroidism: TSH 51.3>> 22.62 -Continue Synthroid  Essential hypertension: Slightly lower BP earlier this morning likely from BiPAP. -Continue monitoring.  UTI- treated with ceftriaxone   AKI: Resolved  Goal of care:  On 12/06/2020-discussed with patient's daughter over the phone.  Patient remains encephalopathic.  Overall prognosis grim.  Discussed about her CODE STATUS and the pros and cons of resuscitation and intubation.  Patient's daughter voiced understanding but wished to continue full code for now -Palliative care recommended outpatient follow-up  Feeding difficulties partly due to encephalopathy -SLP recommended n.p.o. -Continue enteral nutrition via nasogastric tube Body mass index is 31.22 kg/m. Nutrition Problem: Inadequate oral intake Etiology: acute illness Signs/Symptoms: NPO status Interventions: Tube feeding,Prostat   Pressure injury: Stage II sacral and stage II left elbow Pressure Injury 11/24/20 Sacrum Medial Stage 2 -  Partial thickness loss of dermis presenting as a shallow open injury with a red, pink wound bed without slough. non-blanchable red spot on boney area on sacurm with open skin. (Active)  11/24/20 1900  Location: Sacrum  Location Orientation: Medial  Staging: Stage 2 -  Partial thickness loss of dermis presenting as a shallow open injury with a red, pink wound bed without slough.  Wound Description (Comments): non-blanchable red spot on boney area on sacurm with open skin.  Present on Admission: No     Pressure Injury 11/29/20 Elbow Left;Posterior Stage 2 -  Partial thickness loss of dermis presenting as a shallow open injury with a red, pink wound bed without slough. (Active)  11/29/20 1000  Location: Elbow  Location Orientation: Left;Posterior  Staging: Stage 2 -  Partial thickness loss of dermis  presenting as a shallow open injury with a red, pink wound bed without slough.  Wound Description (Comments):   Present on Admission:    DVT prophylaxis:  SCDs Start: 11/23/20 1654  Code Status: Full code Family Communication: Updated patient's daughter over the phone. Level of care: Progressive Status is: Inpatient  Remains inpatient appropriate because:Altered mental status, Unsafe d/c plan, IV treatments appropriate due to intensity of illness or inability to take PO and Inpatient level of care appropriate due to severity of illness   Dispo:  Patient From: Home  Planned Disposition: Skilled Nursing Facility  Expected discharge date: 12/07/2020  Medically stable for discharge: No         Consultants:  PCCM Palliative medicine Neurology   Sch Meds:  Scheduled Meds: . amLODipine  5 mg Per NG tube Daily  . chlorhexidine  15 mL Mouth Rinse BID  . Chlorhexidine Gluconate Cloth  6 each Topical Daily  . feeding supplement (PROSource TF)  45 mL Per Tube Daily  . free water  200 mL Per Tube Q4H  . influenza vaccine adjuvanted  0.5 mL Intramuscular Tomorrow-1000  . insulin aspart  0-24 Units Subcutaneous Q4H  . ipratropium-albuterol  3 mL Nebulization Q8H  . lactulose  20 g Per Tube BID  . levothyroxine  100 mcg Per Tube Q0600  . mouth rinse  15 mL Mouth  Rinse q12n4p  . melatonin  3 mg Per Tube QHS  . pantoprazole sodium  40 mg Per Tube BID  . rifaximin  550 mg Per Tube BID  . [START ON 12/15/2020] thiamine injection  100 mg Intravenous Daily   Continuous Infusions: . sodium chloride Stopped (11/30/20 1413)  . ampicillin-sulbactam (UNASYN) IV 3 g (12/07/20 1223)  . feeding supplement (OSMOLITE 1.5 CAL) Stopped (12/06/20 1925)  . thiamine injection Stopped (12/07/20 0551)   Followed by  . [START ON 12/09/2020] thiamine injection     PRN Meds:.sodium chloride  Antimicrobials: Anti-infectives (From admission, onward)   Start     Dose/Rate Route Frequency Ordered Stop    12/06/20 1630  Ampicillin-Sulbactam (UNASYN) 3 g in sodium chloride 0.9 % 100 mL IVPB        3 g 200 mL/hr over 30 Minutes Intravenous Every 6 hours 12/06/20 1557     12/01/20 1000  rifaximin (XIFAXAN) tablet 550 mg        550 mg Per Tube 2 times daily 12/01/20 0903     11/24/20 1200  cefTRIAXone (ROCEPHIN) 2 g in sodium chloride 0.9 % 100 mL IVPB        2 g 200 mL/hr over 30 Minutes Intravenous Every 24 hours 11/24/20 1047 11/30/20 1305   11/23/20 1730  cefTRIAXone (ROCEPHIN) 2 g in sodium chloride 0.9 % 100 mL IVPB  Status:  Discontinued        2 g 200 mL/hr over 30 Minutes Intravenous Every 24 hours 11/23/20 1721 11/24/20 1047       I have personally reviewed the following labs and images: CBC: Recent Labs  Lab 12/02/20 1604 12/03/20 0629 12/05/20 0216 12/06/20 1556 12/06/20 1657 12/07/20 0421 12/07/20 0856  WBC 8.3 8.8 9.6  --  17.1* 10.0  --   NEUTROABS  --   --  7.9*  --   --   --   --   HGB 8.0* 7.7* 8.0* 8.8* 8.5* 7.4* 6.8*  HCT 25.4* 25.8* 26.0* 26.0* 29.9* 24.1* 20.0*  MCV 94.8 97.0 98.5  --  102.4* 98.0  --   PLT 120* 126* 166  --  241 221  --    BMP &GFR Recent Labs  Lab 12/01/20 0536 12/01/20 0731 12/02/20 0718 12/02/20 1604 12/03/20 0629 12/05/20 0216 12/06/20 1556 12/06/20 1657 12/07/20 0421 12/07/20 0856  NA 138   < >  --  141 140 143 145 144 146* 147*  K >7.5*   < >  --  3.9 3.7 4.0 4.4 4.3 4.1 3.6  CL 106   < >  --  104 103 106  --  105 105  --   CO2 28   < >  --  27 27 25   --  30 31  --   GLUCOSE 102*   < >  --  161* 155* 156*  --  155* 103*  --   BUN 18   < >  --  20 18 20   --  23 26*  --   CREATININE 0.93   < >  --  0.83 0.80 0.71  --  0.72 0.94  --   CALCIUM 8.4*   < >  --  8.7* 8.6* 8.5*  --  8.8* 8.5*  --   MG 1.9  --  1.9  --   --  2.2  --   --  2.3  --   PHOS 2.6  --  2.7  --   --  2.7  --   --  3.5  --    < > = values in this interval not displayed.   Estimated Creatinine Clearance: 48.8 mL/min (by C-G formula based on SCr of 0.94  mg/dL). Liver & Pancreas: Recent Labs  Lab 12/02/20 1604 12/06/20 1657 12/07/20 0421  AST 106* 173* 134*  ALT 48* 114* 88*  ALKPHOS 143* 220* 186*  BILITOT 1.2 1.4* 1.5*  PROT 6.2* 7.5 6.2*  ALBUMIN 2.7* 2.8* 2.5*   No results for input(s): LIPASE, AMYLASE in the last 168 hours. Recent Labs  Lab 12/05/20 0216 12/06/20 1657  AMMONIA 27 18   Diabetic: No results for input(s): HGBA1C in the last 72 hours. Recent Labs  Lab 12/06/20 2114 12/06/20 2348 12/07/20 0404 12/07/20 0739 12/07/20 1108  GLUCAP 87 92 89 95 95   Cardiac Enzymes: No results for input(s): CKTOTAL, CKMB, CKMBINDEX, TROPONINI in the last 168 hours. No results for input(s): PROBNP in the last 8760 hours. Coagulation Profile: No results for input(s): INR, PROTIME in the last 168 hours. Thyroid Function Tests: No results for input(s): TSH, T4TOTAL, FREET4, T3FREE, THYROIDAB in the last 72 hours. Lipid Profile: No results for input(s): CHOL, HDL, LDLCALC, TRIG, CHOLHDL, LDLDIRECT in the last 72 hours. Anemia Panel: Recent Labs    12/05/20 2032  VITAMINB12 1,540*   Urine analysis:    Component Value Date/Time   COLORURINE YELLOW 11/23/2020 1853   APPEARANCEUR CLOUDY (A) 11/23/2020 1853   LABSPEC 1.015 11/23/2020 1853   PHURINE 5.0 11/23/2020 1853   GLUCOSEU NEGATIVE 11/23/2020 1853   HGBUR NEGATIVE 11/23/2020 1853   BILIRUBINUR NEGATIVE 11/23/2020 1853   KETONESUR NEGATIVE 11/23/2020 1853   PROTEINUR 30 (A) 11/23/2020 1853   NITRITE NEGATIVE 11/23/2020 1853   LEUKOCYTESUR MODERATE (A) 11/23/2020 1853   Sepsis Labs: Invalid input(s): PROCALCITONIN, LACTICIDVEN  Microbiology: No results found for this or any previous visit (from the past 240 hour(s)).  Radiology Studies: No results found.    Miriana Gaertner T. Dawnita Molner [facility]  If 7PM-7AM, please contact night-coverage [email or web address] 12/07/2020, 1:03 PM

## 2020-12-07 NOTE — Progress Notes (Signed)
  Speech Language Pathology Treatment: Dysphagia  Patient Details Name: Kaylee King MRN: [id number] DOB: 10-27-1941 Today's Date: 12/07/2020 Time: 3295-1884 SLP Time Calculation (min) (ACUTE ONLY): 20 min  Assessment / Plan / Recommendation Clinical Impression  Pt seen at bedside to assess readiness to begin PO intake. Pt was awake, required encouragement to participate. Limited oral care completed, due to pt not cooperative. She did accept individual ice chips x3. Several minutes later pt exhibited a delayed cough and O2 sats dropped to 87%. Sats returned to 94% quickly. Pt declined further PO trials. Recommend continuing with NPO status. Cortrak in place. SLP will continue to follow to assess readiness for po intake. RN updated.   HPI HPI: Pt is an 80 year old female with history of hypothyroidism noncompliant who presented with progressive headaches, fatigue, confusion, and hypotension of unclear etiology. In the ED, had coffee ground/maroon colored hematemesis and was intubated for airway protection. CXR: Interim placement of feeding tube, its tip is below left hemidiaphragm. CT head 1/20 was negative. Pt found to have upper GI bleed from esophageal varices and is followed by GI. EDG 1/20- Grade III esophageal varices, banded x6. Type I GE varices without bleeding. Nonbleeding gastric ulcers x3. ETT 1/20-1/27. Cortrak placed on 1/24.      SLP Plan  Continue with current plan of care       Recommendations  Diet recommendations: NPO Medication Administration: Via alternative means                Oral Care Recommendations: Oral care QID;Oral care prior to ice chip/H20 Follow up Recommendations: Skilled Nursing facility;24 hour supervision/assistance SLP Visit Diagnosis: Dysphagia, unspecified (R13.10) Plan: Continue with current plan of care       GO               .Gurpreet Mariani B. Murvin Natal, [facility], CCC-SLP Speech Language Pathologist Office: [phone] Pager: [phone]  Leigh Aurora 12/07/2020, 1:25 PM

## 2020-12-08 DIAGNOSIS — L89152 Pressure ulcer of sacral region, stage 2: Secondary | ICD-10-CM | POA: Diagnosis not present

## 2020-12-08 DIAGNOSIS — E039 Hypothyroidism, unspecified: Secondary | ICD-10-CM | POA: Diagnosis not present

## 2020-12-08 DIAGNOSIS — L89022 Pressure ulcer of left elbow, stage 2: Secondary | ICD-10-CM | POA: Diagnosis not present

## 2020-12-08 DIAGNOSIS — G9341 Metabolic encephalopathy: Secondary | ICD-10-CM | POA: Diagnosis not present

## 2020-12-08 LAB — CBC
HCT: 28.3 % — ABNORMAL LOW (ref 36.0–46.0)
Hemoglobin: 8.4 g/dL — ABNORMAL LOW (ref 12.0–15.0)
MCH: 29.4 pg (ref 26.0–34.0)
MCHC: 29.7 g/dL — ABNORMAL LOW (ref 30.0–36.0)
MCV: 99 fL (ref 80.0–100.0)
Platelets: 282 10*3/uL (ref 150–400)
RBC: 2.86 MIL/uL — ABNORMAL LOW (ref 3.87–5.11)
RDW: 18.8 % — ABNORMAL HIGH (ref 11.5–15.5)
WBC: 6.9 10*3/uL (ref 4.0–10.5)
nRBC: 1.2 % — ABNORMAL HIGH (ref 0.0–0.2)

## 2020-12-08 LAB — COMPREHENSIVE METABOLIC PANEL
ALT: 77 U/L — ABNORMAL HIGH (ref 0–44)
AST: 102 U/L — ABNORMAL HIGH (ref 15–41)
Albumin: 2.9 g/dL — ABNORMAL LOW (ref 3.5–5.0)
Alkaline Phosphatase: 217 U/L — ABNORMAL HIGH (ref 38–126)
Anion gap: 15 (ref 5–15)
BUN: 30 mg/dL — ABNORMAL HIGH (ref 8–23)
CO2: 27 mmol/L (ref 22–32)
Calcium: 8.9 mg/dL (ref 8.9–10.3)
Chloride: 104 mmol/L (ref 98–111)
Creatinine, Ser: 1.14 mg/dL — ABNORMAL HIGH (ref 0.44–1.00)
GFR, Estimated: 48 mL/min — ABNORMAL LOW (ref >60)
Glucose, Bld: 149 mg/dL — ABNORMAL HIGH (ref 70–99)
Potassium: 3.5 mmol/L (ref 3.5–5.1)
Sodium: 146 mmol/L — ABNORMAL HIGH (ref 135–145)
Total Bilirubin: 1.9 mg/dL — ABNORMAL HIGH (ref 0.3–1.2)
Total Protein: 7.3 g/dL (ref 6.5–8.1)

## 2020-12-08 LAB — GLUCOSE, CAPILLARY
Glucose-Capillary: 102 mg/dL — ABNORMAL HIGH (ref 70–99)
Glucose-Capillary: 102 mg/dL — ABNORMAL HIGH (ref 70–99)
Glucose-Capillary: 110 mg/dL — ABNORMAL HIGH (ref 70–99)
Glucose-Capillary: 123 mg/dL — ABNORMAL HIGH (ref 70–99)
Glucose-Capillary: 129 mg/dL — ABNORMAL HIGH (ref 70–99)
Glucose-Capillary: 144 mg/dL — ABNORMAL HIGH (ref 70–99)
Glucose-Capillary: 91 mg/dL (ref 70–99)

## 2020-12-08 LAB — PHOSPHORUS: Phosphorus: 3 mg/dL (ref 2.5–4.6)

## 2020-12-08 LAB — APTT: aPTT: 24 seconds (ref 24–36)

## 2020-12-08 LAB — MAGNESIUM: Magnesium: 2.1 mg/dL (ref 1.7–2.4)

## 2020-12-08 LAB — PROCALCITONIN: Procalcitonin: 0.21 ng/mL

## 2020-12-08 LAB — PROTIME-INR
INR: 1.2 (ref 0.8–1.2)
Prothrombin Time: 14.9 seconds (ref 11.4–15.2)

## 2020-12-08 MED ORDER — SPIRONOLACTONE 25 MG PO TABS
25.0000 mg | ORAL_TABLET | Freq: Every day | ORAL | Status: DC
  Administered 2020-12-08 – 2020-12-13 (×4): 25 mg
  Filled 2020-12-08 (×6): qty 1

## 2020-12-08 MED ORDER — NUTRISOURCE FIBER PO PACK
1.0000 | PACK | Freq: Two times a day (BID) | ORAL | Status: DC
  Administered 2020-12-08 – 2020-12-13 (×8): 1
  Filled 2020-12-08 (×13): qty 1

## 2020-12-08 NOTE — Progress Notes (Addendum)
1945-Patient is more alert and less lethargic compared to report given from previous nurse. Pt is A&O1( to person)  2130- Pt is also calling out that she is in pain and wanting to go home. Nurse gave prn (see mar)  2200-Nurse restarted patients tube feed, osmolite 1.5 64mL per MD . Patient tolerated well.

## 2020-12-08 NOTE — Progress Notes (Signed)
PROGRESS NOTE  Kaylee King JIR:[id number] DOB: 04/05/1941   PCP: Fleet Contras, MD  Patient is from: Home  DOA: 11/23/2020 LOS: 15  Chief complaints: Altered mental status and GI bleed  Brief Narrative / Interim history: 80 year old female with PMH of prior alcohol abuse, cirrhosis, hypothyroidism and noncompliance presenting with coffee-ground hematemesis, hemorrhagic shock and encephalopathy and intubated for airway protection.  She was transfused 6 units of PRBC and 2 units of FFP while in ICU.  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 She also completed 7 days of IV ceftriaxone for presumed pneumonia.  Eventually extubated and transferred to [facility] service for ongoing care.  Patient was lethargic and somewhat somnolent after extubation.  CT head without acute finding. Encephalopathy presumed to be hepatic and she was treated with lactulose without improvement.  TSH was also elevated and she was started on Synthroid.  She had Cortrak placed due to dysphagia/poor p.o. intake in the setting of encephalopathy.    Patient desaturated to 60s on 7L requiring NRB. ABG with respiratory acidosis and hypercapnia.  CXR concerning for bibasilar opacities likely infiltrate raising concern for aspiration pneumonia.  Started on BiPAP and IV Unasyn.  CCM consulted as well.  Eventually came off BiPAP to nasal cannula.  Unasyn discontinued.  Stable from respiratory standpoint.  She is awake and alert but confused.  Only oriented to self.   Subjective: Seen and examined earlier this morning.  No major events overnight of this morning.  Awake and alert but confused.  She is only oriented to self.  Follows command appropriately.  Saturating 88 to 93% on RA.  Responds no to pain.  Objective: Vitals:   12/08/20 0547 12/08/20 0745 12/08/20 0804 12/08/20 1135  BP:  118/71  127/78  Pulse:  79 72 77  Resp:  19 (!) 23 15  Temp:  98.6 F (37 C)  98.6 F (37 C)  TempSrc:  Oral  Oral  SpO2: 99% 100% 96%  100%  Weight:      Height:        Intake/Output Summary (Last 24 hours) at 12/08/2020 1346 Last data filed at 12/08/2020 0230 Gross per 24 hour  Intake 138.86 ml  Output 900 ml  Net -761.14 ml   Filed Weights   12/01/20 0500 12/02/20 0500 12/08/20 0500  Weight: 77.6 kg 82.5 kg 76.5 kg    Examination:  GENERAL: No apparent distress.  Nontoxic. HEENT: MMM.  Vision and hearing grossly intact.  NECK: Supple.  No apparent JVD.  RESP: 88 to 93% on RA.  No IWOB.  Fair aeration bilaterally. CVS:  RRR. Heart sounds normal.  ABD/GI/GU: BS+. Abd soft, NTND.  MSK/EXT:  Moves extremities. No apparent deformity. No edema.  SKIN: no apparent skin lesion or wound NEURO: Awake and alert but confused and only oriented to self.  Follows commands.  No apparent focal neuro deficit. PSYCH: Somewhat restless.  No distress or agitation.  Procedures:  Intubation and extubation  Microbiology summarized: COVID-19 PCR nonreactive.  Assessment & Plan: Acute metabolic encephalopathy-was concern about hepatic encephalopathy.  However, ammonia level within normal.  CT head without acute finding.  TSH 51.3 >>22.68.  Also history of alcohol abuse but not lately.  Awake and alert but confused.  Oriented to self and follows commands. -Continue Synthroid 100 mcg daily -Reduced lactulose to 20 g twice daily -[place]ke dose thiamine -Reorientation and delirium precautions. -Aspiration and fall precautions -Neurology following.  Acute hypoxic hypercapnic respiratory failure-no history of COPD or asthma.  Was concern about aspiration pneumonitis/pneumonia and started on Unasyn.  Resolved.  Saturating at 88 to 93% on RA. -Unasyn discontinued.  Aspiration pneumonia felt less likely per PCCM but cannot rule out. -Continue nebulizers -Aspiration precautions. -Minimum oxygen to keep saturation above 88%.  ABLA due to variceal bleed.  Patient with history of cirrhosis Recent Labs    11/30/20 0500 12/01/20 0536  12/02/20 1604 12/03/20 0629 12/05/20 0216 12/06/20 1556 12/06/20 1657 12/07/20 0421 12/07/20 0856 12/08/20 0222  HGB 7.3* 7.7* 8.0* 7.7* 8.0* 8.8* 8.5* 7.4* 6.8* 8.4*  -EGD on 1/20 showed Grade III varices (banded), clotted blood in the gastric fundus, type I GEV, nonbleeding GU x3 with no stigmata of bleeding. -Received about 6u or pRBC and 2u of FFP. -SCD for VTE prophylaxis -H. pylori IgG positive but H. pylori IgM negative-previously treated? -Continue PPI therapy   Hypernatremia: Na 146>>147> -Continue free water-was on hold while she was on BiPAP.  Elevated liver enzymes: AST/ALT peaked at 134/88 but is downtrending. -Check CK and LFT in a.m.  Hypothyroidism: TSH 51.3>> 22.62 -Continue Synthroid -Repeat TSH in the morning  Essential hypertension: Normotensive. -Continue monitoring.  UTI- treated with ceftriaxone   AKI: Resolved  Goal of care:  On 12/06/2020-discussed with patient's daughter over the phone.  Patient remains encephalopathic.  Overall prognosis grim.  Discussed about her CODE STATUS and the pros and cons of resuscitation and intubation.  Patient's daughter voiced understanding but wished to continue full code for now -Palliative care recommended outpatient follow-up  Feeding difficulties partly due to encephalopathy -SLP recommended n.p.o. -Continue enteral nutrition via nasogastric tube Body mass index is 28.95 kg/m. Nutrition Problem: Inadequate oral intake Etiology: acute illness Signs/Symptoms: NPO status Interventions: Tube feeding,Prostat   Pressure injury: Stage II sacral and stage II left elbow Pressure Injury 11/24/20 Sacrum Medial Stage 2 -  Partial thickness loss of dermis presenting as a shallow open injury with a red, pink wound bed without slough. non-blanchable red spot on boney area on sacurm with open skin. (Active)  11/24/20 1900  Location: Sacrum  Location Orientation: Medial  Staging: Stage 2 -  Partial thickness loss of  dermis presenting as a shallow open injury with a red, pink wound bed without slough.  Wound Description (Comments): non-blanchable red spot on boney area on sacurm with open skin.  Present on Admission: No     Pressure Injury 11/29/20 Elbow Left;Posterior Stage 2 -  Partial thickness loss of dermis presenting as a shallow open injury with a red, pink wound bed without slough. (Active)  11/29/20 1000  Location: Elbow  Location Orientation: Left;Posterior  Staging: Stage 2 -  Partial thickness loss of dermis presenting as a shallow open injury with a red, pink wound bed without slough.  Wound Description (Comments):   Present on Admission:    DVT prophylaxis:  SCDs Start: 11/23/20 1654  Code Status: Full code Family Communication: Updated patient's daughter over the phone. Level of care: Progressive Status is: Inpatient  Remains inpatient appropriate because:Altered mental status, Unsafe d/c plan, IV treatments appropriate due to intensity of illness or inability to take PO and Inpatient level of care appropriate due to severity of illness   Dispo:  Patient From: Home  Planned Disposition: Skilled Nursing Facility  Expected discharge date: 12/07/2020  Medically stable for discharge: No         Consultants:  PCCM Palliative medicine Neurology   Sch Meds:  Scheduled Meds: . amLODipine  5 mg Per NG tube Daily  .  chlorhexidine  15 mL Mouth Rinse BID  . Chlorhexidine Gluconate Cloth  6 each Topical Daily  . feeding supplement (PROSource TF)  45 mL Per Tube Daily  . free water  200 mL Per Tube Q4H  . influenza vaccine adjuvanted  0.5 mL Intramuscular Tomorrow-1000  . insulin aspart  0-24 Units Subcutaneous Q4H  . lactulose  20 g Per Tube BID  . levothyroxine  100 mcg Per Tube Q0600  . mouth rinse  15 mL Mouth Rinse q12n4p  . melatonin  3 mg Per Tube QHS  . pantoprazole sodium  40 mg Per Tube BID  . rifaximin  550 mg Per Tube BID  . spironolactone  25 mg Per Tube Daily   . [START ON 12/15/2020] thiamine injection  100 mg Intravenous Daily   Continuous Infusions: . sodium chloride Stopped (11/30/20 1413)  . feeding supplement (OSMOLITE 1.5 CAL) 1,000 mL (12/08/20 0700)  . thiamine injection 500 mg (12/08/20 0522)   Followed by  . [START ON 12/09/2020] thiamine injection     PRN Meds:.sodium chloride, acetaminophen (TYLENOL) oral liquid 160 mg/5 mL, ipratropium-albuterol  Antimicrobials: Anti-infectives (From admission, onward)   Start     Dose/Rate Route Frequency Ordered Stop   12/06/20 1630  Ampicillin-Sulbactam (UNASYN) 3 g in sodium chloride 0.9 % 100 mL IVPB  Status:  Discontinued        3 g 200 mL/hr over 30 Minutes Intravenous Every 6 hours 12/06/20 1557 12/07/20 1329   12/01/20 1000  rifaximin (XIFAXAN) tablet 550 mg        550 mg Per Tube 2 times daily 12/01/20 0903     11/24/20 1200  cefTRIAXone (ROCEPHIN) 2 g in sodium chloride 0.9 % 100 mL IVPB        2 g 200 mL/hr over 30 Minutes Intravenous Every 24 hours 11/24/20 1047 11/30/20 1305   11/23/20 1730  cefTRIAXone (ROCEPHIN) 2 g in sodium chloride 0.9 % 100 mL IVPB  Status:  Discontinued        2 g 200 mL/hr over 30 Minutes Intravenous Every 24 hours 11/23/20 1721 11/24/20 1047       I have personally reviewed the following labs and images: CBC: Recent Labs  Lab 12/03/20 0629 12/05/20 0216 12/06/20 1556 12/06/20 1657 12/07/20 0421 12/07/20 0856 12/08/20 0222  WBC 8.8 9.6  --  17.1* 10.0  --  6.9  NEUTROABS  --  7.9*  --   --   --   --   --   HGB 7.7* 8.0* 8.8* 8.5* 7.4* 6.8* 8.4*  HCT 25.8* 26.0* 26.0* 29.9* 24.1* 20.0* 28.3*  MCV 97.0 98.5  --  102.4* 98.0  --  99.0  PLT 126* 166  --  241 221  --  282   BMP &GFR Recent Labs  Lab 12/02/20 0718 12/02/20 1604 12/03/20 0629 12/05/20 0216 12/06/20 1556 12/06/20 1657 12/07/20 0421 12/07/20 0856 12/08/20 0222  NA  --    < > 140 143 145 144 146* 147* 146*  K  --    < > 3.7 4.0 4.4 4.3 4.1 3.6 3.5  CL  --    < > 103 106   --  105 105  --  104  CO2  --    < > 27 25  --  30 31  --  27  GLUCOSE  --    < > 155* 156*  --  155* 103*  --  149*  BUN  --    < >  18 20  --  23 26*  --  30*  CREATININE  --    < > 0.80 0.71  --  0.72 0.94  --  1.14*  CALCIUM  --    < > 8.6* 8.5*  --  8.8* 8.5*  --  8.9  MG 1.9  --   --  2.2  --   --  2.3  --  2.1  PHOS 2.7  --   --  2.7  --   --  3.5  --  3.0   < > = values in this interval not displayed.   Estimated Creatinine Clearance: 38.7 mL/min (A) (by C-G formula based on SCr of 1.14 mg/dL (H)). Liver & Pancreas: Recent Labs  Lab 12/02/20 1604 12/06/20 1657 12/07/20 0421 12/08/20 0222  AST 106* 173* 134* 102*  ALT 48* 114* 88* 77*  ALKPHOS 143* 220* 186* 217*  BILITOT 1.2 1.4* 1.5* 1.9*  PROT 6.2* 7.5 6.2* 7.3  ALBUMIN 2.7* 2.8* 2.5* 2.9*   No results for input(s): LIPASE, AMYLASE in the last 168 hours. Recent Labs  Lab 12/05/20 0216 12/06/20 1657  AMMONIA 27 18   Diabetic: No results for input(s): HGBA1C in the last 72 hours. Recent Labs  Lab 12/07/20 1950 12/08/20 0031 12/08/20 0435 12/08/20 0744 12/08/20 1123  GLUCAP 87 102* 102* 129* 91   Cardiac Enzymes: No results for input(s): CKTOTAL, CKMB, CKMBINDEX, TROPONINI in the last 168 hours. No results for input(s): PROBNP in the last 8760 hours. Coagulation Profile: Recent Labs  Lab 12/08/20 0222  INR 1.2   Thyroid Function Tests: No results for input(s): TSH, T4TOTAL, FREET4, T3FREE, THYROIDAB in the last 72 hours. Lipid Profile: No results for input(s): CHOL, HDL, LDLCALC, TRIG, CHOLHDL, LDLDIRECT in the last 72 hours. Anemia Panel: Recent Labs    12/05/20 2032  VITAMINB12 1,540*   Urine analysis:    Component Value Date/Time   COLORURINE YELLOW 11/23/2020 1853   APPEARANCEUR CLOUDY (A) 11/23/2020 1853   LABSPEC 1.015 11/23/2020 1853   PHURINE 5.0 11/23/2020 1853   GLUCOSEU NEGATIVE 11/23/2020 1853   HGBUR NEGATIVE 11/23/2020 1853   BILIRUBINUR NEGATIVE 11/23/2020 1853   KETONESUR  NEGATIVE 11/23/2020 1853   PROTEINUR 30 (A) 11/23/2020 1853   NITRITE NEGATIVE 11/23/2020 1853   LEUKOCYTESUR MODERATE (A) 11/23/2020 1853   Sepsis Labs: Invalid input(s): PROCALCITONIN, LACTICIDVEN  Microbiology: No results found for this or any previous visit (from the past 240 hour(s)).  Radiology Studies: No results found.    Taye T. Gonfa [facility]  If 7PM-7AM, please contact night-coverage [email or web address] 12/08/2020, 1:46 PM

## 2020-12-08 NOTE — Progress Notes (Signed)
Nutrition Follow-up  DOCUMENTATION CODES:   Not applicable  INTERVENTION:   -Nutrisource Fiber BID per tube, each supplement provides 3 g of soluble fiber  -continue TF regimen of Osmolite 1.5 via Cortrak NG at 60 mL/hr.   -Pro-source TF 45 mL daily   This TF regimen provides 1768 kcals, 91 g protein and 1166 mL of free water   200 mL free water every 4 hours Total free water: 2366 mL  NUTRITION DIAGNOSIS:   Inadequate oral intake related to acute illness as evidenced by NPO status.  Ongoing.  GOAL:   Patient will meet greater than or equal to 90% of their needs  Met with tube feeding.  MONITOR:   TF tolerance,Diet advancement  REASON FOR ASSESSMENT:   Consult,Ventilator Enteral/tube feeding initiation and management  ASSESSMENT:   80 yo female admitted with coffee ground/maroon hematemesis with UGIB,shock, acute encephalopathy. PMH NASH cirrhosis, HTN  Pt told intern she had been experiencing nausea, vomiting, diarrhea and stomach pain. Intern discussed with pt's nurse and nurse mentioned that pt had been having diarrhea, but this had been a concern since pt was transferred to this unit. Nurse denied that pt had been experiencing any stomach pain, nausea or vomiting while in her care.   Nutrisource fiber will be given to pt via tube feed to add soluble fiber to her diet. Soluble fiber should help with diarrhea pt has been experiencing.   Pt's weight trends have varied. Pt had a weight loss of 13# since 01/29 where she had a weight of 181.5# and 02/04 where she had a weight of 168.3#. This weight loss is significant. However, weight trends have remained fairly consistent since her admission other than this change.   1/20 - Admitted, Intubated, EGD: 4 columns of large/grade 3 esophageal varicessix blands placed, clotted blood in gastric fundus-large clots removed 1/24 - cortrak placed; tip gastric  01/27 - extubated 01/29 - palliative care consulted 01/31 - SLP  consult, SLP recommends continue with current plan of care (NPO) 02/02 - PCCM reconsult for AMS and respiratory distress 02/03 - SLP consult, SLP recommends continue with current plan of care (NPO)  Meds Reviewed: lactulose (20 g, BID), thiamone (250 mg in sodium chloride 0.9% mL IVPB, daily).  Labs reviewed: glucose (129 mg/dL), sodium (146 mg/dL), BUN (30 mg/dL), creatinine (1.14 mg/dL), GFR (48 mL/min)  Diet Order:   Diet Order            Diet NPO time specified  Diet effective now                 EDUCATION NEEDS:   Not appropriate for education at this time  Skin:  Skin Assessment: Skin Integrity Issues: Skin Integrity Issues:: Incisions,Stage II Stage II: sacrum, elbow Incisions: MASD (perineum)  Last BM:  02/04 (type 7)  Height:   Ht Readings from Last 1 Encounters:  11/23/20 '5\' 4"'  (1.626 m)    Weight:   Wt Readings from Last 1 Encounters:  12/08/20 76.5 kg    Ideal Body Weight:  54.5 kg  BMI:  Body mass index is 28.95 kg/m.  Estimated Nutritional Needs:   Kcal:  1650-1850 kcals  Protein:  85-100 g  Fluid:  >/= 1.7 L    Salvadore Oxford, Dietetic Intern 12/08/2020 4:08 PM

## 2020-12-09 DIAGNOSIS — G9341 Metabolic encephalopathy: Secondary | ICD-10-CM | POA: Diagnosis not present

## 2020-12-09 DIAGNOSIS — E039 Hypothyroidism, unspecified: Secondary | ICD-10-CM | POA: Diagnosis not present

## 2020-12-09 DIAGNOSIS — Z515 Encounter for palliative care: Secondary | ICD-10-CM | POA: Diagnosis not present

## 2020-12-09 DIAGNOSIS — L89152 Pressure ulcer of sacral region, stage 2: Secondary | ICD-10-CM | POA: Diagnosis not present

## 2020-12-09 DIAGNOSIS — Z7189 Other specified counseling: Secondary | ICD-10-CM | POA: Diagnosis not present

## 2020-12-09 DIAGNOSIS — L89022 Pressure ulcer of left elbow, stage 2: Secondary | ICD-10-CM | POA: Diagnosis not present

## 2020-12-09 LAB — GLUCOSE, CAPILLARY
Glucose-Capillary: 90 mg/dL (ref 70–99)
Glucose-Capillary: 158 mg/dL — ABNORMAL HIGH (ref 70–99)
Glucose-Capillary: 123 mg/dL — ABNORMAL HIGH (ref 70–99)
Glucose-Capillary: 127 mg/dL — ABNORMAL HIGH (ref 70–99)
Glucose-Capillary: 116 mg/dL — ABNORMAL HIGH (ref 70–99)
Glucose-Capillary: 134 mg/dL — ABNORMAL HIGH (ref 70–99)

## 2020-12-09 LAB — COMPREHENSIVE METABOLIC PANEL
ALT: 75 U/L — ABNORMAL HIGH (ref 0–44)
AST: 98 U/L — ABNORMAL HIGH (ref 15–41)
Albumin: 2.9 g/dL — ABNORMAL LOW (ref 3.5–5.0)
Alkaline Phosphatase: 216 U/L — ABNORMAL HIGH (ref 38–126)
Anion gap: 14 (ref 5–15)
BUN: 26 mg/dL — ABNORMAL HIGH (ref 8–23)
CO2: 25 mmol/L (ref 22–32)
Calcium: 8.9 mg/dL (ref 8.9–10.3)
Chloride: 106 mmol/L (ref 98–111)
Creatinine, Ser: 0.88 mg/dL (ref 0.44–1.00)
GFR, Estimated: >60 mL/min (ref >60)
Glucose, Bld: 96 mg/dL (ref 70–99)
Potassium: 3.6 mmol/L (ref 3.5–5.1)
Sodium: 145 mmol/L (ref 135–145)
Total Bilirubin: 1.5 mg/dL — ABNORMAL HIGH (ref 0.3–1.2)
Total Protein: 7.3 g/dL (ref 6.5–8.1)

## 2020-12-09 LAB — HEMOGLOBIN AND HEMATOCRIT, BLOOD
HCT: 28.9 % — ABNORMAL LOW (ref 36.0–46.0)
Hemoglobin: 8.9 g/dL — ABNORMAL LOW (ref 12.0–15.0)

## 2020-12-09 LAB — MAGNESIUM: Magnesium: 2.2 mg/dL (ref 1.7–2.4)

## 2020-12-09 LAB — PHOSPHORUS: Phosphorus: 2.9 mg/dL (ref 2.5–4.6)

## 2020-12-09 MED ORDER — LACTULOSE 10 GM/15ML PO SOLN
20.0000 g | Freq: Every day | ORAL | Status: DC
  Administered 2020-12-09 – 2020-12-13 (×3): 20 g
  Filled 2020-12-09 (×4): qty 30

## 2020-12-09 NOTE — Progress Notes (Signed)
   Palliative Medicine Inpatient Follow Up Note  HPI: 80 y.o. female  with past medical history of hypothyroidism, NASH cirrhosis, and history of declining medications and provider visits admitted on 11/23/2020 with headaches, fatigue, confusion. In ED, had coffee ground/maroon colored hematemesis. Was intubated to protect airway. Found to have upper GI bleed from esophageal varicies and cirrhosis. Had banding of large varicies. Also found large, non bleeding gastric ulcer. Requiring multiple blood product transfusions. Extubated 1/27. TSH was 51 on admission, down to 22 now. Mental status improving though she does remain lethargic and confused. PMT consulted to discuss [place].  Today's Discussion (12/09/2020):  *Please note that this is a verbal dictation therefore any spelling or grammatical errors are due to the "[place] [employer]" system interpretation.  Chart reviewed.   I met with Kaylee King at bedside this morning. She was noted to be alert and oriented to person, time, and place. She was appropriate and responsive to questions. She endorses feeling "good" this morning and denies pain, nausea, pruritis.  There is no family at bedside at the time of assessment.   Objective Assessment: Vital Signs Vitals:   12/08/20 2309 12/09/20 0300  BP: (!) 148/58 (!) 149/71  Pulse: 78 61  Resp: 20 20  Temp: (!) 97.5 F (36.4 C) 98.1 F (36.7 C)  SpO2: 100% 97%    Intake/Output Summary (Last 24 hours) at 12/09/2020 1219 Last data filed at 12/09/2020 7588 Gross per 24 hour  Intake --  Output 1300 ml  Net -1300 ml   Last Weight  Most recent update: 12/09/2020  4:56 AM   Weight  76.5 kg (168 lb 10.4 oz)           Physical Exam Constitutional:         Appearance: Frail elderly F Pulmonary:     Effort: Pulmonary effort is normal.  Abdominal:     Comments: Cortrak infusing  Skin:    General: Skin is pale, warm, and dry.  Neurological:     Mental Status: She is oriented  SUMMARY OF  RECOMMENDATIONS   Full Code / Full Scope of Care  TOC - Referred to [place] for ongoing [place] conversations  PMT will incrementally follow though if services are needed sooner please do not hesitate to call [phone]  Time Spent: 15 Greater than 50% of the time was spent in counseling and coordination of care ______________________________________________________________________________________ [place] Team Team Cell Phone: [phone] Please utilize secure chat with additional questions, if there is no response within 30 minutes please call the above phone number  Palliative Medicine Team providers are available by phone from 7am to 7pm daily and can be reached through the team cell phone.  Should this patient require assistance outside of these hours, please call the patient's attending physician.

## 2020-12-09 NOTE — Progress Notes (Signed)
PROGRESS NOTE  Kaylee King HXT:[id number] DOB: 04/18/1941   PCP: Fleet Contras, MD  Patient is from: Home  DOA: 11/23/2020 LOS: 16  Chief complaints: Altered mental status and GI bleed  Brief Narrative / Interim history: 80 year old female with PMH of prior alcohol abuse, cirrhosis, hypothyroidism and noncompliance presenting with coffee-ground hematemesis, hemorrhagic shock and encephalopathy and intubated for airway protection.  She was transfused 6 units of PRBC and 2 units of FFP while in ICU.  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 She also completed 7 days of IV ceftriaxone for presumed pneumonia.  Eventually extubated and transferred to [facility] service for ongoing care.  Patient was lethargic and somewhat somnolent after extubation.  CT head without acute finding. Encephalopathy presumed to be hepatic and she was treated with lactulose without improvement.  TSH was also elevated and she was started on Synthroid.  She had Cortrak placed due to dysphagia/poor p.o. intake in the setting of encephalopathy.    Patient desaturated to 60s on 7L requiring NRB. ABG with respiratory acidosis and hypercapnia.  CXR concerning for bibasilar opacities likely infiltrate raising concern for aspiration pneumonia.  Started on BiPAP and IV Unasyn.  CCM consulted as well.  Eventually came off BiPAP to nasal cannula.  Unasyn discontinued.  Stable from respiratory standpoint.  She is more alert.    Subjective: Seen and examined earlier this morning.  No major events overnight of this morning.  She is awake and alert.  She is oriented to self.  She knew she is in the hospital but could not tell me the name.  She denies pain or shortness of breath.  Follows some commands.  Objective: Vitals:   12/09/20 0456 12/09/20 0812 12/09/20 1200 12/09/20 1247  BP:  (!) 143/64 (!) 148/58   Pulse:  77 76 80  Resp:  20 18 (!) 29  Temp:  98.8 F (37.1 C) 98 F (36.7 C)   TempSrc:  Oral Oral   SpO2:  93%  94% (!) 86%  Weight: 76.5 kg     Height:        Intake/Output Summary (Last 24 hours) at 12/09/2020 1445 Last data filed at 12/09/2020 1105 Gross per 24 hour  Intake 0 ml  Output 1300 ml  Net -1300 ml   Filed Weights   12/02/20 0500 12/08/20 0500 12/09/20 0456  Weight: 82.5 kg 76.5 kg 76.5 kg    Examination:  GENERAL: No apparent distress.  Nontoxic. HEENT: MMM.  Vision and hearing grossly intact.  NECK: Supple.  No apparent JVD.  RESP:  No IWOB.  Fair aeration bilaterally. CVS:  RRR. Heart sounds normal.  ABD/GI/GU: BS+. Abd soft.  Nondistended.  Mild discomfort over LLQ. MSK/EXT:  Moves extremities. No apparent deformity. No edema.  SKIN: no apparent skin lesion or wound NEURO: Awake and alert.  Oriented to self.  Knows she is in the hospital.  Follows some commands.  No apparent focal neuro deficit. PSYCH: Calm.  No distress or agitation.  Procedures:  Intubation and extubation  Microbiology summarized: COVID-19 PCR nonreactive.  Assessment & Plan: Acute metabolic encephalopathy-was concern about hepatic encephalopathy.  However, ammonia level within normal.  CT head without acute finding.  TSH 51.3 >>22.68.  Also history of alcohol abuse but not lately.  Awake and alert but confused.  Oriented to self and knew she is in the hospital.  Follows commands. -Continue Synthroid 100 mcg daily -Reduced lactulose to 20 g daily due to high output. -Completed [place] dose  thiamine-currently on maintenance dose. -Reorientation and delirium precautions. -Aspiration and fall precautions -Will clarify about further plan from neurology.  Acute hypoxic hypercapnic respiratory failure-no history of COPD or asthma.  Was concern about aspiration pneumonitis/pneumonia and started on Unasyn. Unasyn discontinued.  Aspiration pneumonia felt less likely per PCCM but cannot rule out.  Currently on 1 L by [place]. -Continue nebulizers -Aspiration precautions. -Minimum oxygen to keep saturation above  88%.  ABLA due to variceal bleed.  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 -Received about 6u or pRBC and 2u of FFP. -SCD for VTE prophylaxis -H. pylori IgG positive but H. pylori IgM negative-previously treated? -Continue PPI therapy   Hypernatremia: Likely from dehydration while on BiPAP.  Na 146>>147>145 -Continue free water  Elevated liver enzymes: AST/ALT peaked at 134/88 but is downtrending. -Check CK and LFT in a.m.  Hypothyroidism: TSH 51.3>> 22.62 -Continue Synthroid -Repeat TSH in the morning  Essential hypertension: Normotensive. -Continue monitoring.  UTI- treated with ceftriaxone   AKI: Resolved  Goal of care:  On 12/06/2020-discussed with patient's daughter over the phone.  Patient remains encephalopathic.  Overall prognosis grim.  Discussed about her CODE STATUS and the pros and cons of resuscitation and intubation.  Patient's daughter voiced understanding but wished to continue full code for now -Palliative care recommended outpatient follow-up  Feeding difficulties partly due to encephalopathy -SLP recommended n.p.o. -Continue enteral nutrition via nasogastric tube Body mass index is 28.95 kg/m. Nutrition Problem: Inadequate oral intake Etiology: acute illness Signs/Symptoms: NPO status Interventions: Tube feeding,Prostat   Pressure injury: Stage II sacral and stage II left elbow Pressure Injury 11/24/20 Sacrum Medial Stage 2 -  Partial thickness loss of dermis presenting as a shallow open injury with a red, pink wound bed without slough. non-blanchable red spot on boney area on sacurm with open skin.  (Active)  11/24/20 1900  Location: Sacrum  Location Orientation: Medial  Staging: Stage 2 -  Partial thickness loss of dermis presenting as a shallow open injury with a red, pink wound bed without slough.  Wound Description (Comments): non-blanchable red spot on boney area on sacurm with open skin.  Present on Admission: No     Pressure Injury 11/29/20 Elbow Left;Posterior Stage 2 -  Partial thickness loss of dermis presenting as a shallow open injury with a red, pink wound bed without slough. (Active)  11/29/20 1000  Location: Elbow  Location Orientation: Left;Posterior  Staging: Stage 2 -  Partial thickness loss of dermis presenting as a shallow open injury with a red, pink wound bed without slough.  Wound Description (Comments):   Present on Admission:    DVT prophylaxis:  SCDs Start: 11/23/20 1654  Code Status: Full code Family Communication: Updated patient's daughter over the phone on 2/4. Level of care: Progressive Status is: Inpatient  Remains inpatient appropriate because:Altered mental status, Unsafe d/c plan, IV treatments appropriate due to intensity of illness or inability to take PO and Inpatient level of care appropriate due to severity of illness   Dispo:  Patient From: Home  Planned Disposition: Skilled Nursing Facility  Expected discharge date: 12/11/2020  Medically stable for discharge: No  Consultants:  PCCM Palliative medicine Neurology   Sch Meds:  Scheduled Meds: . amLODipine  5 mg Per NG tube Daily  . chlorhexidine  15 mL Mouth Rinse BID  . Chlorhexidine Gluconate Cloth  6 each Topical Daily  . feeding supplement (PROSource TF)  45 mL Per Tube Daily  . fiber  1 packet Per Tube BID  . free water  200 mL Per Tube Q4H  . influenza vaccine adjuvanted  0.5 mL Intramuscular Tomorrow-1000  . insulin aspart  0-24 Units Subcutaneous Q4H  . lactulose  20 g Per Tube Daily  . levothyroxine  100 mcg Per Tube Q0600  . mouth rinse  15 mL Mouth  Rinse q12n4p  . melatonin  3 mg Per Tube QHS  . pantoprazole sodium  40 mg Per Tube BID  . rifaximin  550 mg Per Tube BID  . spironolactone  25 mg Per Tube Daily  . [START ON 12/15/2020] thiamine injection  100 mg Intravenous Daily   Continuous Infusions: . sodium chloride Stopped (11/30/20 1413)  . feeding supplement (OSMOLITE 1.5 CAL) 60 mL/hr at 12/09/20 1134  . thiamine injection Stopped (12/09/20 1105)   PRN Meds:.sodium chloride, acetaminophen (TYLENOL) oral liquid 160 mg/5 mL, ipratropium-albuterol  Antimicrobials: Anti-infectives (From admission, onward)   Start     Dose/Rate Route Frequency Ordered Stop   12/06/20 1630  Ampicillin-Sulbactam (UNASYN) 3 g in sodium chloride 0.9 % 100 mL IVPB  Status:  Discontinued        3 g 200 mL/hr over 30 Minutes Intravenous Every 6 hours 12/06/20 1557 12/07/20 1329   12/01/20 1000  rifaximin (XIFAXAN) tablet 550 mg        550 mg Per Tube 2 times daily 12/01/20 0903     11/24/20 1200  cefTRIAXone (ROCEPHIN) 2 g in sodium chloride 0.9 % 100 mL IVPB        2 g 200 mL/hr over 30 Minutes Intravenous Every 24 hours 11/24/20 1047 11/30/20 1305   11/23/20 1730  cefTRIAXone (ROCEPHIN) 2 g in sodium chloride 0.9 % 100 mL IVPB  Status:  Discontinued        2 g 200 mL/hr over 30 Minutes Intravenous Every 24 hours 11/23/20 1721 11/24/20 1047       I have personally reviewed the following labs and images: CBC: Recent Labs  Lab 12/03/20 0629 12/05/20 0216 12/06/20 1556 12/06/20 1657 12/07/20 0421 12/07/20 0856 12/08/20 0222 12/09/20 0244  WBC 8.8 9.6  --  17.1* 10.0  --  6.9  --   NEUTROABS  --  7.9*  --   --   --   --   --   --   HGB 7.7* 8.0*   < > 8.5* 7.4* 6.8* 8.4* 8.9*  HCT 25.8* 26.0*   < > 29.9* 24.1* 20.0* 28.3* 28.9*  MCV 97.0 98.5  --  102.4* 98.0  --  99.0  --   PLT 126* 166  --  241 221  --  282  --    < > = values in this interval not displayed.   BMP &GFR Recent Labs  Lab 12/05/20 0216 12/06/20 1556 12/06/20 1657  12/07/20 0421 12/07/20 0856 12/08/20 0222 12/09/20 0244  NA 143   < > 144 146* 147* 146* 145  K 4.0   < > 4.3 4.1 3.6 3.5 3.6  CL 106  --  105 105  --  104 106  CO2 25  --  30 31  --  27 25  GLUCOSE 156*  --  155* 103*  --  149* 96  BUN 20  --  23 26*  --  30* 26*  CREATININE 0.71  --  0.72 0.94  --  1.14* 0.88  CALCIUM 8.5*  --  8.8* 8.5*  --  8.9 8.9  MG 2.2  --   --  2.3  --  2.1 2.2  PHOS 2.7  --   --  3.5  --  3.0 2.9   < > = values in this interval not displayed.   Estimated Creatinine Clearance: 50.2 mL/min (by C-G formula based on SCr of 0.88 mg/dL). Liver & Pancreas: Recent Labs  Lab 12/02/20 1604 12/06/20 1657 12/07/20 0421 12/08/20 0222 12/09/20 0244  AST 106* 173* 134* 102* 98*  ALT 48* 114* 88* 77* 75*  ALKPHOS 143* 220* 186* 217* 216*  BILITOT 1.2 1.4* 1.5* 1.9* 1.5*  PROT 6.2* 7.5 6.2* 7.3 7.3  ALBUMIN 2.7* 2.8* 2.5* 2.9* 2.9*   No results for input(s): LIPASE, AMYLASE in the last 168 hours. Recent Labs  Lab 12/05/20 0216 12/06/20 1657  AMMONIA 27 18   Diabetic: No results for input(s): HGBA1C in the last 72 hours. Recent Labs  Lab 12/08/20 1936 12/08/20 2349 12/09/20 0317 12/09/20 0810 12/09/20 1206  GLUCAP 110* 144* 90 158* 123*   Cardiac Enzymes: No results for input(s): CKTOTAL, CKMB, CKMBINDEX, TROPONINI in the last 168 hours. No results for input(s): PROBNP in the last 8760 hours. Coagulation Profile: Recent Labs  Lab 12/08/20 0222  INR 1.2   Thyroid Function Tests: No results for input(s): TSH, T4TOTAL, FREET4, T3FREE, THYROIDAB in the last 72 hours. Lipid Profile: No results for input(s): CHOL, HDL, LDLCALC, TRIG, CHOLHDL, LDLDIRECT in the last 72 hours. Anemia Panel: No results for input(s): VITAMINB12, FOLATE, FERRITIN, TIBC, IRON, RETICCTPCT in the last 72 hours. Urine analysis:    Component Value Date/Time   COLORURINE YELLOW 11/23/2020 1853   APPEARANCEUR CLOUDY (A) 11/23/2020 1853   LABSPEC 1.015 11/23/2020 1853    PHURINE 5.0 11/23/2020 1853   GLUCOSEU NEGATIVE 11/23/2020 1853   HGBUR NEGATIVE 11/23/2020 1853   BILIRUBINUR NEGATIVE 11/23/2020 1853   KETONESUR NEGATIVE 11/23/2020 1853   PROTEINUR 30 (A) 11/23/2020 1853   NITRITE NEGATIVE 11/23/2020 1853   LEUKOCYTESUR MODERATE (A) 11/23/2020 1853   Sepsis Labs: Invalid input(s): PROCALCITONIN, LACTICIDVEN  Microbiology: No results found for this or any previous visit (from the past 240 hour(s)).  Radiology Studies: No results found.    Taye T. Gonfa [facility]  If 7PM-7AM, please contact night-coverage [email or web address] 12/09/2020, 2:45 PM

## 2020-12-09 NOTE — Plan of Care (Signed)
  Problem: Education: Goal: Knowledge of General Education information will improve Description: Including pain rating scale, medication(s)/side effects and non-pharmacologic comfort measures Outcome: Progressing   Problem: Health Behavior/Discharge Planning: Goal: Ability to manage health-related needs will improve Outcome: Progressing   Problem: Clinical Measurements: Goal: Ability to maintain clinical measurements within normal limits will improve Outcome: Progressing Goal: Will remain free from infection Outcome: Progressing Goal: Diagnostic test results will improve Outcome: Progressing Goal: Respiratory complications will improve Outcome: Progressing Goal: Cardiovascular complication will be avoided Outcome: Progressing   Problem: Activity: Goal: Risk for activity intolerance will decrease Outcome: Progressing   Problem: Nutrition: Goal: Adequate nutrition will be maintained Outcome: Progressing   Problem: Coping: Goal: Level of anxiety will decrease Outcome: Progressing   Problem: Elimination: Goal: Will not experience complications related to bowel motility Outcome: Progressing Goal: Will not experience complications related to urinary retention Outcome: Progressing   Problem: Pain Managment: Goal: General experience of comfort will improve Outcome: Progressing   Problem: Safety: Goal: Ability to remain free from injury will improve Outcome: Progressing   Problem: Skin Integrity: Goal: Risk for impaired skin integrity will decrease Outcome: Progressing   Problem: Safety: Goal: Non-violent Restraint(s) Outcome: Progressing   Problem: Activity: Goal: Ability to tolerate increased activity will improve Outcome: Progressing   Problem: Respiratory: Goal: Ability to maintain a clear airway and adequate ventilation will improve Outcome: Progressing   Problem: Role Relationship: Goal: Method of communication will improve Outcome: Progressing   

## 2020-12-10 ENCOUNTER — Inpatient Hospital Stay (HOSPITAL_COMMUNITY): Payer: Medicare Other

## 2020-12-10 DIAGNOSIS — L89152 Pressure ulcer of sacral region, stage 2: Secondary | ICD-10-CM | POA: Diagnosis not present

## 2020-12-10 DIAGNOSIS — G9341 Metabolic encephalopathy: Secondary | ICD-10-CM | POA: Diagnosis not present

## 2020-12-10 DIAGNOSIS — L89022 Pressure ulcer of left elbow, stage 2: Secondary | ICD-10-CM | POA: Diagnosis not present

## 2020-12-10 DIAGNOSIS — E039 Hypothyroidism, unspecified: Secondary | ICD-10-CM | POA: Diagnosis not present

## 2020-12-10 LAB — GLUCOSE, CAPILLARY
Glucose-Capillary: 109 mg/dL — ABNORMAL HIGH (ref 70–99)
Glucose-Capillary: 114 mg/dL — ABNORMAL HIGH (ref 70–99)
Glucose-Capillary: 117 mg/dL — ABNORMAL HIGH (ref 70–99)
Glucose-Capillary: 119 mg/dL — ABNORMAL HIGH (ref 70–99)
Glucose-Capillary: 119 mg/dL — ABNORMAL HIGH (ref 70–99)
Glucose-Capillary: 151 mg/dL — ABNORMAL HIGH (ref 70–99)

## 2020-12-10 NOTE — Progress Notes (Signed)
PROGRESS NOTE  Kaylee King RJJ:[id number] DOB: [birth year]   PCP: Fleet Contras, MD  Patient is from: Home  DOA: 11/23/2020 LOS: 17  Chief complaints: Altered mental status and GI bleed  Brief Narrative / Interim history: 80 year old female with PMH of prior alcohol abuse, cirrhosis, hypothyroidism and noncompliance presenting with coffee-ground hematemesis, hemorrhagic shock and encephalopathy and intubated for airway protection.  She was transfused 6 units of PRBC and 2 units of FFP while in ICU.  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 She also completed 7 days of IV ceftriaxone for presumed pneumonia.  Eventually extubated and transferred to [facility] service for ongoing care.  Patient was lethargic and somewhat somnolent after extubation.  CT head without acute finding. Encephalopathy presumed to be hepatic and she was treated with lactulose without improvement.  TSH was also elevated and she was started on Synthroid.  She had Cortrak placed due to dysphagia/poor p.o. intake in the setting of encephalopathy.    Patient desaturated to 60s on 7L requiring NRB. ABG with respiratory acidosis and hypercapnia.  CXR concerning for bibasilar opacities likely infiltrate raising concern for aspiration pneumonia.  Started on BiPAP and IV Unasyn.  CCM consulted as well.  Eventually came off BiPAP to nasal cannula.  Unasyn discontinued. Stable from respiratory standpoint but mental status remains poor.    Subjective: Seen and examined earlier this morning.  No major events overnight of this morning.  She is awake but not quite alert as yesterday.  She responds no to pain.  She is oriented to self.  She knew she is in the hospital.  Follows some commands.  Objective: Vitals:   12/10/20 0230 12/10/20 0429 12/10/20 0717 12/10/20 1103  BP:  140/69 (!) 148/66 (!) 146/56  Pulse:  80 78 65  Resp:  (!) 22 (!) 21 (!) 21  Temp:  97.9 F (36.6 C) 98.1 F (36.7 C) 97.6 F (36.4 C)  TempSrc:   Axillary Axillary Axillary  SpO2:  97% (!) 89% 94%  Weight: 72.1 kg     Height:        Intake/Output Summary (Last 24 hours) at 12/10/2020 1235 Last data filed at 12/10/2020 0300 Gross per 24 hour  Intake 720 ml  Output 800 ml  Net -80 ml   Filed Weights   12/08/20 0500 12/09/20 0456 12/10/20 0230  Weight: 76.5 kg 76.5 kg 72.1 kg    Examination:  GENERAL: No apparent distress.  Nontoxic. HEENT: MMM.  Vision and hearing grossly intact.  NECK: Supple.  No apparent JVD.  RESP: 90% on 3 L.  No IWOB.  Fair aeration bilaterally. CVS:  RRR. Heart sounds normal.  ABD/GI/GU: BS+. Abd soft, NTND.  MSK/EXT:  Moves extremities. No apparent deformity. No edema.  SKIN: no apparent skin lesion or wound NEURO: Awake.  Oriented to self and "hospital".  No apparent focal neuro deficit. PSYCH: Calm.  No distress or agitation.  Procedures:  Intubation and extubation  Microbiology summarized: COVID-19 PCR nonreactive.  Assessment & Plan: Acute metabolic encephalopathy-was concern about hepatic encephalopathy.  However, ammonia level within normal.  CT head without acute finding.  TSH 51.3 >>22.68.  Also history of alcohol abuse but not lately.  Awake and alert but confused.  Oriented to self and knew she is in the hospital.  Follows commands. -Continue Synthroid 100 mcg daily -Reduced lactulose to 20 g daily due to high output. -Completed Warnicke dose thiamine-currently on maintenance dose. -Reorientation and delirium precautions. -Aspiration and fall precautions -Reached  out to neuro, Dr. Thomasena Edis on 2/5>>MRI and EEG-ordered.  MRI is with general anesthesia on 2/7 at 8 AM. Tube feed will be on hold after midnight.  Notified patient's RN.  Acute hypoxic hypercapnic respiratory failure-no history of COPD or asthma.  Was concern about aspiration pneumonitis/pneumonia and started on Unasyn. Unasyn discontinued.  Aspiration pneumonia felt less likely per PCCM but cannot rule out.  Currently  requiring 1 to 3 L. -Continue nebulizers -Aspiration precautions. -Minimum oxygen to keep saturation above 88%.  ABLA due to variceal bleed.  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 -Received about 6u or pRBC and 2u of FFP. -SCD for VTE prophylaxis -H. pylori IgG positive but H. pylori IgM negative-previously treated? -Continue PPI therapy   Hypernatremia: Likely from dehydration while on BiPAP.  Na 146>>147>145 -Continue free water  Elevated liver enzymes: AST/ALT peaked at 134/88 but is downtrending. -Check CK and LFT in a.m.  Hypothyroidism: TSH 51.3>> 22.62 -Continue Synthroid -Repeat TSH in the morning  Essential hypertension: Normotensive. -Continue monitoring.  UTI- treated with ceftriaxone   AKI: Resolved  Goal of care:  On 12/06/2020-discussed with patient's daughter over the phone.  Patient remains encephalopathic.  Overall prognosis grim.  Discussed about her CODE STATUS and the pros and cons of resuscitation and intubation.  Patient's daughter voiced understanding but wished to continue full code for now -Palliative care recommended outpatient follow-up  Feeding difficulties partly due to encephalopathy -SLP recommended n.p.o. -Continue enteral nutrition via nasogastric tube Body mass index is 27.28 kg/m. Nutrition Problem: Inadequate oral intake Etiology: acute illness Signs/Symptoms: NPO status Interventions: Tube feeding,Prostat   Pressure injury: Stage II sacral and stage II left elbow Pressure Injury 11/24/20 Sacrum Medial Stage 2 -  Partial thickness loss of dermis presenting as a shallow open  injury with a red, pink wound bed without slough. non-blanchable red spot on boney area on sacurm with open skin. (Active)  11/24/20 1900  Location: Sacrum  Location Orientation: Medial  Staging: Stage 2 -  Partial thickness loss of dermis presenting as a shallow open injury with a red, pink wound bed without slough.  Wound Description (Comments): non-blanchable red spot on boney area on sacurm with open skin.  Present on Admission: No     Pressure Injury 11/29/20 Elbow Left;Posterior Stage 2 -  Partial thickness loss of dermis presenting as a shallow open injury with a red, pink wound bed without slough. (Active)  11/29/20 1000  Location: Elbow  Location Orientation: Left;Posterior  Staging: Stage 2 -  Partial thickness loss of dermis presenting as a shallow open injury with a red, pink wound bed without slough.  Wound Description (Comments):   Present on Admission:    DVT prophylaxis:  SCDs Start: 11/23/20 1654  Code Status: Full code Family Communication: None at bedside. Level of care: Progressive Status is: Inpatient  Remains inpatient appropriate because:Altered mental status, Unsafe d/c plan, IV treatments appropriate due to intensity of illness or inability to take PO and Inpatient level of care appropriate due to severity of illness   Dispo:  Patient From: Home  Planned Disposition: Skilled Nursing Facility  Expected discharge date: 12/11/2020  Medically stable for discharge: No         Consultants:  PCCM Palliative medicine Neurology   Sch Meds:  Scheduled Meds: . amLODipine  5 mg Per NG tube Daily  . chlorhexidine  15 mL Mouth Rinse BID  . Chlorhexidine Gluconate Cloth  6 each Topical Daily  . feeding supplement (PROSource TF)  45 mL Per Tube Daily  . fiber  1 packet Per Tube BID  . free water  200 mL Per Tube Q4H  . influenza vaccine adjuvanted  0.5 mL Intramuscular Tomorrow-1000  . insulin aspart  0-24 Units Subcutaneous Q4H  . lactulose  20 g Per Tube  Daily  . levothyroxine  100 mcg Per Tube Q0600  . mouth rinse  15 mL Mouth Rinse q12n4p  . melatonin  3 mg Per Tube QHS  . pantoprazole sodium  40 mg Per Tube BID  . rifaximin  550 mg Per Tube BID  . spironolactone  25 mg Per Tube Daily  . [START ON 12/15/2020] thiamine injection  100 mg Intravenous Daily   Continuous Infusions: . sodium chloride 10 mL/hr at 12/10/20 0903  . feeding supplement (OSMOLITE 1.5 CAL) 60 mL/hr at 12/09/20 1835  . thiamine injection 250 mg (12/10/20 0904)   PRN Meds:.sodium chloride, acetaminophen (TYLENOL) oral liquid 160 mg/5 mL, ipratropium-albuterol  Antimicrobials: Anti-infectives (From admission, onward)   Start     Dose/Rate Route Frequency Ordered Stop   12/06/20 1630  Ampicillin-Sulbactam (UNASYN) 3 g in sodium chloride 0.9 % 100 mL IVPB  Status:  Discontinued        3 g 200 mL/hr over 30 Minutes Intravenous Every 6 hours 12/06/20 1557 12/07/20 1329   12/01/20 1000  rifaximin (XIFAXAN) tablet 550 mg        550 mg Per Tube 2 times daily 12/01/20 0903     11/24/20 1200  cefTRIAXone (ROCEPHIN) 2 g in sodium chloride 0.9 % 100 mL IVPB        2 g 200 mL/hr over 30 Minutes Intravenous Every 24 hours 11/24/20 1047 11/30/20 1305   11/23/20 1730  cefTRIAXone (ROCEPHIN) 2 g in sodium chloride 0.9 % 100 mL IVPB  Status:  Discontinued        2 g 200 mL/hr over 30 Minutes Intravenous Every 24 hours 11/23/20 1721 11/24/20 1047       I have personally reviewed the following labs and images: CBC: Recent Labs  Lab 12/05/20 0216 12/06/20 1556 12/06/20 1657 12/07/20 0421 12/07/20 0856 12/08/20 0222 12/09/20 0244  WBC 9.6  --  17.1* 10.0  --  6.9  --   NEUTROABS 7.9*  --   --   --   --   --   --   HGB 8.0*   < > 8.5* 7.4* 6.8* 8.4* 8.9*  HCT 26.0*   < > 29.9* 24.1* 20.0* 28.3* 28.9*  MCV 98.5  --  102.4* 98.0  --  99.0  --   PLT 166  --  241 221  --  282  --    < > = values in this interval not displayed.   BMP &GFR Recent Labs  Lab 12/05/20 0216  12/06/20 1556 12/06/20 1657 12/07/20 0421 12/07/20 0856 12/08/20 0222 12/09/20 0244  NA 143   < > 144 146* 147* 146* 145  K 4.0   < > 4.3 4.1 3.6 3.5 3.6  CL 106  --  105 105  --  104 106  CO2 25  --  30  31  --  27 25  GLUCOSE 156*  --  155* 103*  --  149* 96  BUN 20  --  23 26*  --  30* 26*  CREATININE 0.71  --  0.72 0.94  --  1.14* 0.88  CALCIUM 8.5*  --  8.8* 8.5*  --  8.9 8.9  MG 2.2  --   --  2.3  --  2.1 2.2  PHOS 2.7  --   --  3.5  --  3.0 2.9   < > = values in this interval not displayed.   Estimated Creatinine Clearance: 48.8 mL/min (by C-G formula based on SCr of 0.88 mg/dL). Liver & Pancreas: Recent Labs  Lab 12/06/20 1657 12/07/20 0421 12/08/20 0222 12/09/20 0244  AST 173* 134* 102* 98*  ALT 114* 88* 77* 75*  ALKPHOS 220* 186* 217* 216*  BILITOT 1.4* 1.5* 1.9* 1.5*  PROT 7.5 6.2* 7.3 7.3  ALBUMIN 2.8* 2.5* 2.9* 2.9*   No results for input(s): LIPASE, AMYLASE in the last 168 hours. Recent Labs  Lab 12/05/20 0216 12/06/20 1657  AMMONIA 27 18   Diabetic: No results for input(s): HGBA1C in the last 72 hours. Recent Labs  Lab 12/09/20 2004 12/09/20 2327 12/10/20 0420 12/10/20 0714 12/10/20 1106  GLUCAP 116* 134* 151* 119* 114*   Cardiac Enzymes: No results for input(s): CKTOTAL, CKMB, CKMBINDEX, TROPONINI in the last 168 hours. No results for input(s): PROBNP in the last 8760 hours. Coagulation Profile: Recent Labs  Lab 12/08/20 0222  INR 1.2   Thyroid Function Tests: No results for input(s): TSH, T4TOTAL, FREET4, T3FREE, THYROIDAB in the last 72 hours. Lipid Profile: No results for input(s): CHOL, HDL, LDLCALC, TRIG, CHOLHDL, LDLDIRECT in the last 72 hours. Anemia Panel: No results for input(s): VITAMINB12, FOLATE, FERRITIN, TIBC, IRON, RETICCTPCT in the last 72 hours. Urine analysis:    Component Value Date/Time   COLORURINE YELLOW 11/23/2020 1853   APPEARANCEUR CLOUDY (A) 11/23/2020 1853   LABSPEC 1.015 11/23/2020 1853   PHURINE 5.0  11/23/2020 1853   GLUCOSEU NEGATIVE 11/23/2020 1853   HGBUR NEGATIVE 11/23/2020 1853   BILIRUBINUR NEGATIVE 11/23/2020 1853   KETONESUR NEGATIVE 11/23/2020 1853   PROTEINUR 30 (A) 11/23/2020 1853   NITRITE NEGATIVE 11/23/2020 1853   LEUKOCYTESUR MODERATE (A) 11/23/2020 1853   Sepsis Labs: Invalid input(s): PROCALCITONIN, LACTICIDVEN  Microbiology: No results found for this or any previous visit (from the past 240 hour(s)).  Radiology Studies: DG Abd Portable 1V  Result Date: 12/10/2020 CLINICAL DATA:  Determine of the tip of the feeding tube is metallic prior to MRI. EXAM: PORTABLE ABDOMEN - 1 VIEW COMPARISON:  None. FINDINGS: Feeding tube with the tip projecting over the antrum of the stomach. Tip of the feeding tube appears to be metallic. No bowel dilatation to suggest obstruction. No evidence of pneumoperitoneum, portal venous gas or pneumatosis. No pathologic calcifications along the expected course of the ureters. Bibasilar airspace disease which may reflect atelectasis versus pneumonia. No acute osseous abnormality. IMPRESSION: Feeding tube with the tip projecting over the antrum of the stomach. Tip of the feeding tube appears to be metallic. Recommend correlation with manufacturer guidelines as to whether the feeding tube is MRI compatible or not. Electronically Signed   By: Elige Ko   On: 12/10/2020 12:08      Aishi Courts T. Myda Detwiler [facility]  If 7PM-7AM, please contact night-coverage [email or web address] 12/10/2020, 12:35 PM

## 2020-12-11 ENCOUNTER — Encounter (HOSPITAL_COMMUNITY): Payer: Self-pay | Admitting: Internal Medicine

## 2020-12-11 ENCOUNTER — Inpatient Hospital Stay (HOSPITAL_COMMUNITY): Payer: Medicare Other

## 2020-12-11 DIAGNOSIS — G9341 Metabolic encephalopathy: Secondary | ICD-10-CM | POA: Diagnosis not present

## 2020-12-11 DIAGNOSIS — E039 Hypothyroidism, unspecified: Secondary | ICD-10-CM | POA: Diagnosis not present

## 2020-12-11 DIAGNOSIS — L89022 Pressure ulcer of left elbow, stage 2: Secondary | ICD-10-CM | POA: Diagnosis not present

## 2020-12-11 DIAGNOSIS — L89152 Pressure ulcer of sacral region, stage 2: Secondary | ICD-10-CM | POA: Diagnosis not present

## 2020-12-11 DIAGNOSIS — D62 Acute posthemorrhagic anemia: Secondary | ICD-10-CM | POA: Diagnosis not present

## 2020-12-11 LAB — COMPREHENSIVE METABOLIC PANEL
ALT: 54 U/L — ABNORMAL HIGH (ref 0–44)
AST: 68 U/L — ABNORMAL HIGH (ref 15–41)
Albumin: 2.6 g/dL — ABNORMAL LOW (ref 3.5–5.0)
Alkaline Phosphatase: 171 U/L — ABNORMAL HIGH (ref 38–126)
Anion gap: 8 (ref 5–15)
BUN: 21 mg/dL (ref 8–23)
CO2: 28 mmol/L (ref 22–32)
Calcium: 8.6 mg/dL — ABNORMAL LOW (ref 8.9–10.3)
Chloride: 108 mmol/L (ref 98–111)
Creatinine, Ser: 0.8 mg/dL (ref 0.44–1.00)
GFR, Estimated: >60 mL/min (ref >60)
Glucose, Bld: 132 mg/dL — ABNORMAL HIGH (ref 70–99)
Potassium: 4.2 mmol/L (ref 3.5–5.1)
Sodium: 144 mmol/L (ref 135–145)
Total Bilirubin: 1 mg/dL (ref 0.3–1.2)
Total Protein: 6.5 g/dL (ref 6.5–8.1)

## 2020-12-11 LAB — GLUCOSE, CAPILLARY
Glucose-Capillary: 111 mg/dL — ABNORMAL HIGH (ref 70–99)
Glucose-Capillary: 104 mg/dL — ABNORMAL HIGH (ref 70–99)
Glucose-Capillary: 97 mg/dL (ref 70–99)
Glucose-Capillary: 108 mg/dL — ABNORMAL HIGH (ref 70–99)

## 2020-12-11 LAB — MAGNESIUM: Magnesium: 2.4 mg/dL (ref 1.7–2.4)

## 2020-12-11 LAB — HEMOGLOBIN AND HEMATOCRIT, BLOOD
HCT: 26.8 % — ABNORMAL LOW (ref 36.0–46.0)
Hemoglobin: 7.8 g/dL — ABNORMAL LOW (ref 12.0–15.0)

## 2020-12-11 LAB — PHOSPHORUS: Phosphorus: 3.2 mg/dL (ref 2.5–4.6)

## 2020-12-11 NOTE — Progress Notes (Signed)
Patient resting comfortably on 3L [place]. No respiratory distress noted. BIPAP not needed at this time. RT will monitor as needed. 

## 2020-12-11 NOTE — Progress Notes (Signed)
This RN spoke to Dr. Alanda Slim on the phone regarding pt's MRI and confirmed with MD to keep pt completely NPO with no medication administration.   Robina Ade, RN

## 2020-12-11 NOTE — Progress Notes (Signed)
  Speech Language Pathology Treatment: Dysphagia  Patient Details Name: Kaylee King MRN: [id number] DOB: 01/29/1941 Today's Date: 12/11/2020 Time: 2355-7322 SLP Time Calculation (min) (ACUTE ONLY): 12 min  Assessment / Plan / Recommendation Clinical Impression  Pt was seen for dysphagia treatment. Pt's case was discussed with Kaylee Citizen, RN who indicated that it was fine for SLP to proceed with treatment. RN indicated at the end of the session that pt was NPO for procedure, but indicated that the quantity which was provided during the session should not be problematic. She was alert and cooperative during the session. Her mentation appears improved compared to prior sessions. She was conversant and reported that she has been asking staff for water for months, but has not been given any. Pt was educated regarding her performance up until today's session and the rationale for her NPO status. No s/sx of aspiration were noted during today's session. Trials were limited to 1/4 piece of graham cracker, water, and 3-4 boluses of 1/2 to full-tsp boluses of puree. Mastication was prolonged with regular texture solids, likely due to dental status. Respiratory function was well maintained with SpO2 95-97% and RR <25. A dysphagia 1 diet with thin liquids may be initiated once pt is no longer NPO for MRI.    HPI HPI: Pt is an 81 year old female with history of hypothyroidism noncompliant who presented with progressive headaches, fatigue, confusion, and hypotension of unclear etiology. In the ED, had coffee ground/maroon colored hematemesis and was intubated for airway protection. CXR: Interim placement of feeding tube, its tip is below left hemidiaphragm. CT head 1/20 was negative. Pt found to have upper GI bleed from esophageal varices and is followed by GI. EDG 1/20- Grade III esophageal varices, banded x6. Type I GE varices without bleeding. Nonbleeding gastric ulcers x3. ETT 1/20-1/27. Cortrak placed on 1/24. CXR 2/6:  Markedly improved aeration of both lungs.  2. Persistent mild interstitial pattern and bilateral pleural  effusions, right greater than left.      SLP Plan  Continue with current plan of care       Recommendations  Diet recommendations: Dysphagia 1 (puree);Thin liquid Liquids provided via: Cup;Straw Medication Administration: Via alternative means Supervision: Staff to assist with self feeding;Full supervision/cueing for compensatory strategies Compensations: Slow rate;Small sips/bites Postural Changes and/or Swallow Maneuvers: Seated upright 90 degrees                Oral Care Recommendations: Oral care BID Follow up Recommendations: Skilled Nursing facility SLP Visit Diagnosis: Dysphagia, unspecified (R13.10) Plan: Continue with current plan of care       Kaylee King I. Kaylee Clock, MS, CCC-SLP Acute Rehabilitation Services Office number [phone] Pager [phone]                Kaylee King 12/11/2020, 12:01 PM

## 2020-12-11 NOTE — Procedures (Addendum)
Patient Name: Kaylee King  MRN: [id number]  Epilepsy Attending: Charlsie Quest  Referring Physician/Provider: Dr Candelaria Stagers Date: 12/11/2020 Duration: 24.37 mins  Patient history: 80yo F with ams. EEG to evaluate for seizure  Level of alertness: Awake  AEDs during EEG study: None  Technical aspects: This EEG study was done with scalp electrodes positioned according to the 10-20 International system of electrode placement. Electrical activity was acquired at a sampling rate of 500Hz  and reviewed with a high frequency filter of 70Hz  and a low frequency filter of 1Hz . EEG data were recorded continuously and digitally stored.   Description: The posterior dominant rhythm consists of 8 Hz activity of moderate voltage (25-35 uV) seen predominantly in posterior head regions, symmetric and reactive to eye opening and eye closing. EEG showed continuous generalized 5 to 6 Hz theta slowing.  Hyperventilation and photic stimulation were not performed.     ABNORMALITY -Continuous slow, generalized  IMPRESSION: This study is suggestive of mild diffuse encephalopathy, nonspecific etiology. No seizures or epileptiform discharges were seen throughout the recording.  Kaylee King 

## 2020-12-11 NOTE — Progress Notes (Addendum)
Speech contacted this RN to ask about pt's status today. Speech then worked with pt and did give pt small amount to test swallowing - afterwards changing pt's diet. This RN will keep pt NPO despite diet change in Epic and will confirm if pt is still safe for procedure today.   Robina Ade, RN

## 2020-12-11 NOTE — Progress Notes (Addendum)
MRI called and said they will take pt at 0800 tomorrow. Dr.  Alanda Slim gave orders to go ahead and start pt's tube feedings now and stop them at midnight. Will pass on to nightshift. Order in Epic also placed for NPO at midnight.   Robina Ade, RN

## 2020-12-11 NOTE — Anesthesia Preprocedure Evaluation (Addendum)
Anesthesia Evaluation  Patient identified by MRN, date of birth, ID band Patient confused  General Assessment Comment:Awake  Reviewed: Allergy & Precautions, H&P , NPO status , Patient's Chart, lab work & pertinent test results  Airway Mallampati: II   Neck ROM: full    Dental   Pulmonary former smoker,  Recently intubated for PNA   breath sounds clear to auscultation       Cardiovascular hypertension,  Rhythm:regular Rate:Normal     Neuro/Psych    GI/Hepatic PUD, (+) Cirrhosis       , Varicies (recent bleeding required banding and PRBC transfusion)   Endo/Other  Hypothyroidism   Renal/GU      Musculoskeletal   Abdominal   Peds  Hematology  (+) anemia ,   Anesthesia Other Findings   Reproductive/Obstetrics                             Anesthesia Physical Anesthesia Plan  ASA: IV  Anesthesia Plan: General   Post-op Pain Management:    Induction: Intravenous  PONV Risk Score and Plan: 3 and Ondansetron, Propofol infusion and Treatment may vary due to age or medical condition  Airway Management Planned: Oral ETT  Additional Equipment:   Intra-op Plan:   Post-operative Plan: Extubation in OR and Possible Post-op intubation/ventilation  Informed Consent: I have reviewed the patients History and Physical, chart, labs and discussed the procedure including the risks, benefits and alternatives for the proposed anesthesia with the patient or authorized representative who has indicated his/her understanding and acceptance.       Plan Discussed with: CRNA, Anesthesiologist and Surgeon  Anesthesia Plan Comments:         Anesthesia Quick Evaluation

## 2020-12-11 NOTE — Progress Notes (Signed)
630- Nurse received call from radiology stating patient would go down for MRI. This nurse was unaware that patient was going to be placed under anesthesia and that TF needed to be stopped.   0700-When radiology called for report nurse let Molly Maduro RN know that this nurse did not stop tube feed and was unaware of needing to stop it. RN stated they would send patient back to floor and gave number to call charge CRNA to see when patient will have MRI rescheduled.   730- This nurse reported to [employer] this information. To stop tube feed when patient is brought back to floor and to call CRNA charge about the rescheduling of MRI. Also communicated if not done today to tell night shift nurse to stop tube feed at midnight.    Celene Squibb RN

## 2020-12-11 NOTE — Progress Notes (Signed)
Per Felizardo Hoffmann, RN she did not know that MRI is under anesthesia. Patient with tube feeding still running that per X-Ray on 12/10/2020 tip is sitting in stomach. Dr. Chaney Malling notified and will delay anesthesia 8 hours. Spoke to [employer], Database administrator the will coordinate with MRI and determine if same can be done later today. Requested floor stop tube feeding on patient arrival back to unit and communicate with charge CRNA to determine if same will be completed today. Ask Felizardo Hoffmann, RN to communicate with day RN that if procedure is not done today tube feedings will need to be stopped at midnight.

## 2020-12-11 NOTE — Progress Notes (Signed)
Pt will be scheduled for MRI tomorrow, as anesthesia does not have availability for pt once she would be cleared to go. Dr. Alanda Slim updated via secure chat.   Robina Ade, RN

## 2020-12-11 NOTE — Progress Notes (Signed)
Tube feeds stopped at this time to prepare pt for MRI under anesthesia.   Robina Ade, RN

## 2020-12-11 NOTE — Progress Notes (Signed)
Portable EEG completed, results pending. 

## 2020-12-11 NOTE — Progress Notes (Signed)
PROGRESS NOTE  Kaylee King LOV:[id number] DOB: 07-05-1941   PCP: Fleet Contras, MD  Patient is from: Home  DOA: 11/23/2020 LOS: 18  Chief complaints: Altered mental status and GI bleed  Brief Narrative / Interim history: 80 year old female with PMH of prior alcohol abuse, cirrhosis, hypothyroidism and noncompliance presenting with coffee-ground hematemesis, hemorrhagic shock and encephalopathy and intubated for airway protection.  She was transfused 6 units of PRBC and 2 units of FFP while in ICU.  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 She also completed 7 days of IV ceftriaxone for presumed pneumonia.  Eventually extubated and transferred to [facility] service for ongoing care.  Patient was lethargic and somewhat somnolent after extubation.  CT head without acute finding. Encephalopathy presumed to be hepatic and she was treated with lactulose without improvement.  TSH was also elevated and she was started on Synthroid.  She had Cortrak placed due to dysphagia/poor p.o. intake in the setting of encephalopathy.    Patient desaturated to 60s on 7L requiring NRB. ABG with respiratory acidosis and hypercapnia.  CXR concerning for bibasilar opacities likely infiltrate raising concern for aspiration pneumonia.  Started on BiPAP and IV Unasyn.  CCM consulted as well.  Eventually came off BiPAP to nasal cannula.  Unasyn discontinued. Stable from respiratory standpoint but mental status remains poor for most part although seems to be fluctuating.    Subjective: Seen and examined earlier this morning.  No major events overnight of this morning.  She did not get her MRI brain as planned this morning as her tube feed was not held last night. MRI pushed to this afternoon.  Patient is awake and more alert.  She is oriented to self, place, month and the president.  Follows commands.  She responds no to pain but grunting.  Saturating at 90% on 4 L.  Objective: Vitals:   12/10/20 2341 12/11/20 0305  12/11/20 0931 12/11/20 1200  BP: (!) 141/63 (!) 141/76 140/69 (!) 159/70  Pulse: 75 79 80 79  Resp: (!) 22 19 (!) 21 20  Temp: 98.2 F (36.8 C)  (!) 96.7 F (35.9 C) 97.7 F (36.5 C)  TempSrc: Axillary  Axillary Axillary  SpO2: 98% 100% 93% 97%  Weight:      Height:        Intake/Output Summary (Last 24 hours) at 12/11/2020 1413 Last data filed at 12/11/2020 1227 Gross per 24 hour  Intake -  Output 700 ml  Net -700 ml   Filed Weights   12/08/20 0500 12/09/20 0456 12/10/20 0230  Weight: 76.5 kg 76.5 kg 72.1 kg    Examination:  GENERAL: No apparent distress.  Nontoxic. HEENT: MMM.  Vision and hearing grossly intact.  NECK: Supple.  No apparent JVD.  RESP: 90% on 4 L.  No IWOB.  Fair aeration bilaterally. CVS:  RRR. Heart sounds normal.  ABD/GI/GU: BS+. Abd soft, NTND.  MSK/EXT:  Moves extremities. No apparent deformity. No edema.  SKIN: no apparent skin lesion or wound NEURO: Awake and alert.  Oriented to self, place, month and the president.  Follows commands.  No apparent focal neuro deficit. PSYCH: Calm.  No distress or agitation.  Procedures:  Intubation and extubation  Microbiology summarized: COVID-19 PCR nonreactive.  Assessment & Plan: Acute metabolic encephalopathy-was concern about hepatic encephalopathy.  However, ammonia level within normal.  CT head without acute finding.  TSH 51.3 >>22.68.  Also history of alcohol abuse but not lately.  She is awake and alert.  Oriented to  self, place, month and president.  -Continue Synthroid 100 mcg daily -Reduced lactulose to 20 g daily due to high output. -Completed [place] dose thiamine-currently on maintenance dose. -Reorientation and delirium precautions. -Aspiration and fall precautions -Reached out to neuro, Dr. Thomasena Edis on 2/5>>MRI and EEG-ordered.   -MRI was planned for 8 AM this morning but overnight RN didn't hold the TF. MRI pushed to this PM  Acute hypoxic hypercapnic respiratory failure-no history of COPD  or asthma.  Was concern about aspiration pneumonitis/pneumonia and started on Unasyn. Unasyn discontinued.  Aspiration pneumonia felt less likely per PCCM but cannot rule out.  Currently requiring 4 L. -Continue nebulizers -Aspiration precautions. -Minimum oxygen to keep saturation above 88%.  ABLA due to variceal bleed.  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 -Received about 6u or pRBC and 2u of FFP. -SCD for VTE prophylaxis -H. pylori IgG positive but H. pylori IgM negative-previously treated? -Continue PPI therapy   Hypernatremia: Resolved. -Continue free water with tube feed.  Elevated liver enzymes: AST/ALT peaked at 134/88 but is downtrending. -Monitor intermittently  Hypothyroidism: TSH 51.3>> 22.62 -Continue Synthroid -Repeat TSH in the morning  Essential hypertension: Normotensive. -Continue monitoring.  UTI- treated with ceftriaxone   AKI: Resolved  Goal of care:  On 12/06/2020-discussed with patient's daughter over the phone.  Patient remains encephalopathic.  Overall prognosis grim.  Discussed about her CODE STATUS and the pros and cons of resuscitation and intubation.  Patient's daughter voiced understanding but wished to continue full code for now -Palliative care recommended outpatient follow-up  Feeding difficulties partly due to encephalopathy -SLP advanced her to dysphagia 1 diet -Continue enteral nutrition via nasogastric tube Body mass index is 27.28 kg/m. Nutrition Problem: Inadequate oral intake Etiology: acute illness Signs/Symptoms: NPO status Interventions: Tube feeding,Prostat   Pressure  injury: Stage II sacral and stage II left elbow Pressure Injury 11/24/20 Sacrum Medial Stage 2 -  Partial thickness loss of dermis presenting as a shallow open injury with a red, pink wound bed without slough. non-blanchable red spot on boney area on sacurm with open skin. (Active)  11/24/20 1900  Location: Sacrum  Location Orientation: Medial  Staging: Stage 2 -  Partial thickness loss of dermis presenting as a shallow open injury with a red, pink wound bed without slough.  Wound Description (Comments): non-blanchable red spot on boney area on sacurm with open skin.  Present on Admission: No     Pressure Injury 11/29/20 Elbow Left;Posterior Stage 2 -  Partial thickness loss of dermis presenting as a shallow open injury with a red, pink wound bed without slough. (Active)  11/29/20 1000  Location: Elbow  Location Orientation: Left;Posterior  Staging: Stage 2 -  Partial thickness loss of dermis presenting as a shallow open injury with a red, pink wound bed without slough.  Wound Description (Comments):   Present on Admission:    DVT prophylaxis:  SCDs Start: 11/23/20 1654  Code Status: Full code Family Communication: Updated patient's daughter over the phone. Level of care: Progressive Status is: Inpatient  Remains inpatient appropriate because:Altered mental status, Unsafe d/c plan, IV treatments appropriate due to intensity of illness or inability  to take PO and Inpatient level of care appropriate due to severity of illness   Dispo:  Patient From: Home  Planned Disposition: Skilled Nursing Facility  Expected discharge date: 12/11/2020  Medically stable for discharge: No         Consultants:  PCCM Palliative medicine Neurology   Sch Meds:  Scheduled Meds: . amLODipine  5 mg Per NG tube Daily  . chlorhexidine  15 mL Mouth Rinse BID  . Chlorhexidine Gluconate Cloth  6 each Topical Daily  . feeding supplement (PROSource TF)  45 mL Per Tube Daily  . fiber  1 packet Per  Tube BID  . free water  200 mL Per Tube Q4H  . influenza vaccine adjuvanted  0.5 mL Intramuscular Tomorrow-1000  . insulin aspart  0-24 Units Subcutaneous Q4H  . lactulose  20 g Per Tube Daily  . levothyroxine  100 mcg Per Tube Q0600  . mouth rinse  15 mL Mouth Rinse q12n4p  . melatonin  3 mg Per Tube QHS  . pantoprazole sodium  40 mg Per Tube BID  . rifaximin  550 mg Per Tube BID  . spironolactone  25 mg Per Tube Daily  . [START ON 12/15/2020] thiamine injection  100 mg Intravenous Daily   Continuous Infusions: . sodium chloride 10 mL/hr at 12/10/20 0903  . feeding supplement (OSMOLITE 1.5 CAL) 60 mL/hr at 12/09/20 1835  . thiamine injection 250 mg (12/10/20 0904)   PRN Meds:.sodium chloride, acetaminophen (TYLENOL) oral liquid 160 mg/5 mL, ipratropium-albuterol  Antimicrobials: Anti-infectives (From admission, onward)   Start     Dose/Rate Route Frequency Ordered Stop   12/06/20 1630  Ampicillin-Sulbactam (UNASYN) 3 g in sodium chloride 0.9 % 100 mL IVPB  Status:  Discontinued        3 g 200 mL/hr over 30 Minutes Intravenous Every 6 hours 12/06/20 1557 12/07/20 1329   12/01/20 1000  rifaximin (XIFAXAN) tablet 550 mg        550 mg Per Tube 2 times daily 12/01/20 0903     11/24/20 1200  cefTRIAXone (ROCEPHIN) 2 g in sodium chloride 0.9 % 100 mL IVPB        2 g 200 mL/hr over 30 Minutes Intravenous Every 24 hours 11/24/20 1047 11/30/20 1305   11/23/20 1730  cefTRIAXone (ROCEPHIN) 2 g in sodium chloride 0.9 % 100 mL IVPB  Status:  Discontinued        2 g 200 mL/hr over 30 Minutes Intravenous Every 24 hours 11/23/20 1721 11/24/20 1047       I have personally reviewed the following labs and images: CBC: Recent Labs  Lab 12/05/20 0216 12/06/20 1556 12/06/20 1657 12/07/20 0421 12/07/20 0856 12/08/20 0222 12/09/20 0244 12/11/20 0126  WBC 9.6  --  17.1* 10.0  --  6.9  --   --   NEUTROABS 7.9*  --   --   --   --   --   --   --   HGB 8.0*   < > 8.5* 7.4* 6.8* 8.4* 8.9* 7.8*   HCT 26.0*   < > 29.9* 24.1* 20.0* 28.3* 28.9* 26.8*  MCV 98.5  --  102.4* 98.0  --  99.0  --   --   PLT 166  --  241 221  --  282  --   --    < > = values in this interval not displayed.   BMP &GFR Recent Labs  Lab 12/05/20 0216 12/06/20 1556 12/06/20 1657 12/07/20 0421 12/07/20  4944 12/08/20 0222 12/09/20 0244 12/11/20 0126  NA 143   < > 144 146* 147* 146* 145 144  K 4.0   < > 4.3 4.1 3.6 3.5 3.6 4.2  CL 106  --  105 105  --  104 106 108  CO2 25  --  30 31  --  27 25 28   GLUCOSE 156*  --  155* 103*  --  149* 96 132*  BUN 20  --  23 26*  --  30* 26* 21  CREATININE 0.71  --  0.72 0.94  --  1.14* 0.88 0.80  CALCIUM 8.5*  --  8.8* 8.5*  --  8.9 8.9 8.6*  MG 2.2  --   --  2.3  --  2.1 2.2 2.4  PHOS 2.7  --   --  3.5  --  3.0 2.9 3.2   < > = values in this interval not displayed.   Estimated Creatinine Clearance: 52.8 mL/min (by C-G formula based on SCr of 0.8 mg/dL). Liver & Pancreas: Recent Labs  Lab 12/06/20 1657 12/07/20 0421 12/08/20 0222 12/09/20 0244 12/11/20 0126  AST 173* 134* 102* 98* 68*  ALT 114* 88* 77* 75* 54*  ALKPHOS 220* 186* 217* 216* 171*  BILITOT 1.4* 1.5* 1.9* 1.5* 1.0  PROT 7.5 6.2* 7.3 7.3 6.5  ALBUMIN 2.8* 2.5* 2.9* 2.9* 2.6*   No results for input(s): LIPASE, AMYLASE in the last 168 hours. Recent Labs  Lab 12/05/20 0216 12/06/20 1657  AMMONIA 27 18   Diabetic: No results for input(s): HGBA1C in the last 72 hours. Recent Labs  Lab 12/10/20 1513 12/10/20 1935 12/10/20 2340 12/11/20 0311 12/11/20 1225  GLUCAP 119* 117* 109* 111* 104*   Cardiac Enzymes: No results for input(s): CKTOTAL, CKMB, CKMBINDEX, TROPONINI in the last 168 hours. No results for input(s): PROBNP in the last 8760 hours. Coagulation Profile: Recent Labs  Lab 12/08/20 0222  INR 1.2   Thyroid Function Tests: No results for input(s): TSH, T4TOTAL, FREET4, T3FREE, THYROIDAB in the last 72 hours. Lipid Profile: No results for input(s): CHOL, HDL, LDLCALC, TRIG,  CHOLHDL, LDLDIRECT in the last 72 hours. Anemia Panel: No results for input(s): VITAMINB12, FOLATE, FERRITIN, TIBC, IRON, RETICCTPCT in the last 72 hours. Urine analysis:    Component Value Date/Time   COLORURINE YELLOW 11/23/2020 1853   APPEARANCEUR CLOUDY (A) 11/23/2020 1853   LABSPEC 1.015 11/23/2020 1853   PHURINE 5.0 11/23/2020 1853   GLUCOSEU NEGATIVE 11/23/2020 1853   HGBUR NEGATIVE 11/23/2020 1853   BILIRUBINUR NEGATIVE 11/23/2020 1853   KETONESUR NEGATIVE 11/23/2020 1853   PROTEINUR 30 (A) 11/23/2020 1853   NITRITE NEGATIVE 11/23/2020 1853   LEUKOCYTESUR MODERATE (A) 11/23/2020 1853   Sepsis Labs: Invalid input(s): PROCALCITONIN, LACTICIDVEN  Microbiology: No results found for this or any previous visit (from the past 240 hour(s)).  Radiology Studies: No results found.    Zanayah Shadowens T. Kalen Neidert [facility]  If 7PM-7AM, please contact night-coverage [email or web address] 12/11/2020, 2:13 PM

## 2020-12-11 NOTE — Progress Notes (Signed)
RN called to give report from 4NP when asking about NPO status they advised that speech therapy had seen patient and patient had PO food. Per Dr. Stephannie Peters delay until 1800. Spoke to charge CRNA and patient will need to be rescheduled for a different day. MRI notified and will coordinate with anesthesia for timing. MRI notified that tube feedings/ PO intake would need to be stopped at least 8 hours prior to anesthesia. 4NP RN notified.

## 2020-12-12 ENCOUNTER — Encounter (HOSPITAL_COMMUNITY)
Admission: EM | Disposition: A | Discharge status: Skilled Nursing Facility | Payer: Self-pay | Source: Home / Self Care | Attending: Student

## 2020-12-12 ENCOUNTER — Inpatient Hospital Stay (HOSPITAL_COMMUNITY): Payer: Medicare Other | Admitting: Anesthesiology

## 2020-12-12 ENCOUNTER — Encounter (HOSPITAL_COMMUNITY): Payer: Self-pay | Admitting: Internal Medicine

## 2020-12-12 ENCOUNTER — Inpatient Hospital Stay (HOSPITAL_COMMUNITY): Payer: Medicare Other

## 2020-12-12 DIAGNOSIS — E039 Hypothyroidism, unspecified: Secondary | ICD-10-CM | POA: Diagnosis not present

## 2020-12-12 DIAGNOSIS — L89022 Pressure ulcer of left elbow, stage 2: Secondary | ICD-10-CM | POA: Diagnosis not present

## 2020-12-12 DIAGNOSIS — L89152 Pressure ulcer of sacral region, stage 2: Secondary | ICD-10-CM | POA: Diagnosis not present

## 2020-12-12 DIAGNOSIS — D62 Acute posthemorrhagic anemia: Secondary | ICD-10-CM | POA: Diagnosis not present

## 2020-12-12 HISTORY — PX: RADIOLOGY WITH ANESTHESIA: SHX6223

## 2020-12-12 LAB — HEPATIC FUNCTION PANEL
ALT: 51 U/L — ABNORMAL HIGH (ref 0–44)
AST: 65 U/L — ABNORMAL HIGH (ref 15–41)
Albumin: 2.6 g/dL — ABNORMAL LOW (ref 3.5–5.0)
Alkaline Phosphatase: 176 U/L — ABNORMAL HIGH (ref 38–126)
Bilirubin, Direct: 0.3 mg/dL — ABNORMAL HIGH (ref 0.0–0.2)
Indirect Bilirubin: 1 mg/dL — ABNORMAL HIGH (ref 0.3–0.9)
Total Bilirubin: 1.3 mg/dL — ABNORMAL HIGH (ref 0.3–1.2)
Total Protein: 6.7 g/dL (ref 6.5–8.1)

## 2020-12-12 LAB — GLUCOSE, CAPILLARY
Glucose-Capillary: 103 mg/dL — ABNORMAL HIGH (ref 70–99)
Glucose-Capillary: 131 mg/dL — ABNORMAL HIGH (ref 70–99)
Glucose-Capillary: 88 mg/dL (ref 70–99)
Glucose-Capillary: 90 mg/dL (ref 70–99)
Glucose-Capillary: 94 mg/dL (ref 70–99)
Glucose-Capillary: 98 mg/dL (ref 70–99)

## 2020-12-12 LAB — HEMOGLOBIN AND HEMATOCRIT, BLOOD
HCT: 28.6 % — ABNORMAL LOW (ref 36.0–46.0)
Hemoglobin: 8.3 g/dL — ABNORMAL LOW (ref 12.0–15.0)

## 2020-12-12 LAB — BLOOD GAS, ARTERIAL
Acid-Base Excess: 2.5 mmol/L — ABNORMAL HIGH (ref 0.0–2.0)
Bicarbonate: 26.6 mmol/L (ref 20.0–28.0)
Drawn by: 519031
FIO2: 28
O2 Saturation: 89.3 %
Patient temperature: 36.5
pCO2 arterial: 40.5 mmHg (ref 32.0–48.0)
pH, Arterial: 7.43 (ref 7.350–7.450)
pO2, Arterial: 55.2 mmHg — ABNORMAL LOW (ref 83.0–108.0)

## 2020-12-12 LAB — TSH: TSH: 20.643 u[IU]/mL — ABNORMAL HIGH (ref 0.350–4.500)

## 2020-12-12 LAB — AMMONIA: Ammonia: 24 umol/L (ref 9–35)

## 2020-12-12 LAB — CK: Total CK: 50 U/L (ref 38–234)

## 2020-12-12 SURGERY — MRI WITH ANESTHESIA
Anesthesia: General

## 2020-12-12 MED ORDER — DEXTROSE IN LACTATED RINGERS 5 % IV SOLN: INTRAVENOUS | Status: DC

## 2020-12-12 MED ORDER — ONDANSETRON HCL 4 MG/2ML IJ SOLN
INTRAMUSCULAR | Status: AC
  Filled 2020-12-12: qty 2

## 2020-12-12 MED ORDER — SODIUM CHLORIDE 0.9 % IV SOLN: INTRAVENOUS | Status: DC | PRN

## 2020-12-12 MED ORDER — LEVOTHYROXINE SODIUM 75 MCG PO TABS
112.5000 ug | ORAL_TABLET | Freq: Every day | ORAL | Status: DC
  Administered 2020-12-13: 112.5 ug
  Filled 2020-12-12: qty 2

## 2020-12-12 MED ORDER — OSMOLITE 1.2 CAL PO LIQD: 1000.0000 mL | ORAL | Status: DC

## 2020-12-12 MED ORDER — PHENYLEPHRINE 40 MCG/ML (10ML) SYRINGE FOR IV PUSH (FOR BLOOD PRESSURE SUPPORT)
PREFILLED_SYRINGE | INTRAVENOUS | Status: AC
  Filled 2020-12-12: qty 10

## 2020-12-12 MED ORDER — METOCLOPRAMIDE HCL 5 MG/5ML PO SOLN
5.0000 mg | Freq: Four times a day (QID) | ORAL | Status: DC
  Administered 2020-12-13 (×3): 5 mg
  Filled 2020-12-12 (×8): qty 10

## 2020-12-12 MED ORDER — FENTANYL CITRATE (PF) 250 MCG/5ML IJ SOLN
INTRAMUSCULAR | Status: AC
  Filled 2020-12-12: qty 5

## 2020-12-12 MED ORDER — CALCIUM CHLORIDE 10 % IV SOLN
INTRAVENOUS | Status: AC
  Filled 2020-12-12: qty 10

## 2020-12-12 NOTE — Progress Notes (Signed)
Pt drowsy, disoriented, and non-interactive today. Dr. Alanda Slim ordered labs and ABG. When ABG results came back, Dr. Alanda Slim was notified of P02 55.2. He said to continue with keeping her 02 above 90%. Pt currently sating 98%.   Robina Ade, RN

## 2020-12-12 NOTE — Anesthesia Procedure Notes (Signed)
Procedure Name: MAC Date/Time: 12/12/2020 8:15 AM Performed by: Inda Coke, CRNA Pre-anesthesia Checklist: Patient identified, Emergency Drugs available, Suction available, Timeout performed and Patient being monitored Patient Re-evaluated:Patient Re-evaluated prior to induction Oxygen Delivery Method: Nasal cannula Dental Injury: Teeth and Oropharynx as per pre-operative assessment

## 2020-12-12 NOTE — Progress Notes (Signed)
Pt transported to OR with Little Ishikawa CRNA on portable progressive monitor.

## 2020-12-12 NOTE — Progress Notes (Signed)
RT NOTES: ABG obtained and sent to lab. Lab tech notified.  

## 2020-12-12 NOTE — Transfer of Care (Signed)
Immediate Anesthesia Transfer of Care Note  Patient: Kaylee King  Procedure(s) Performed: MRI WITH ANESTHESIA (N/A )  Patient Location: 4N progressive  Anesthesia Type:MAC  Level of Consciousness: awake and confused  Airway & Oxygen Therapy: Patient Spontanous Breathing and Patient connected to nasal cannula oxygen  Post-op Assessment: Report given to RN and Post -op Vital signs reviewed and stable  Post vital signs: Reviewed and stable  Last Vitals:  Vitals Value Taken Time  BP 135/56 12/12/20 0913  Temp    Pulse 69 12/12/20 0917  Resp 24 12/12/20 0917  SpO2 97 % 12/12/20 0917  Vitals shown include unvalidated device data.  Last Pain:  Vitals:   12/12/20 0319  TempSrc: Axillary  PainSc:          Complications: No complications documented.

## 2020-12-12 NOTE — Progress Notes (Signed)
PROGRESS NOTE  Kaylee King URK:[id number] DOB: 03/02/1941   PCP: Fleet Contras, MD  Patient is from: Home  DOA: 11/23/2020 LOS: 19  Chief complaints: Altered mental status and GI bleed  Brief Narrative / Interim history: 80 year old female with PMH of prior alcohol abuse, cirrhosis, hypothyroidism and noncompliance presenting with coffee-ground hematemesis, hemorrhagic shock and encephalopathy and intubated for airway protection.  She was transfused 6 units of PRBC and 2 units of FFP while in ICU.  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 She also completed 7 days of IV ceftriaxone for presumed pneumonia.  Eventually extubated and transferred to [facility] service for ongoing care.  Patient was lethargic and somewhat somnolent after extubation.  CT head without acute finding. Encephalopathy presumed to be hepatic and she was treated with lactulose without improvement.  TSH was also elevated and she was started on Synthroid.  She had Cortrak placed due to dysphagia/poor p.o. intake in the setting of encephalopathy.    Patient desaturated to 60s on 7L requiring NRB. ABG with respiratory acidosis and hypercapnia.  CXR concerning for bibasilar opacities likely infiltrate raising concern for aspiration pneumonia.  Started on BiPAP and IV Unasyn.  CCM consulted as well.  Eventually came off BiPAP to nasal cannula.  Unasyn discontinued. Stable from respiratory standpoint but mental status remains poor for most part although seems to be fluctuating.    Subjective: No major events overnight or this morning.  Awake but confused.  Not answering orientation questions.  She was n.p.o. after midnight for MRI brain at 8 AM this morning.  Objective: Vitals:   12/11/20 2357 12/12/20 0300 12/12/20 0319 12/12/20 0331  BP: (!) 145/69 (!) 141/68    Pulse: 77 77    Resp: 17 20    Temp: 98.7 F (37.1 C)  97.9 F (36.6 C)   TempSrc: Axillary  Axillary   SpO2: 98% 93%    Weight:    72 kg  Height:         Intake/Output Summary (Last 24 hours) at 12/12/2020 0734 Last data filed at 12/12/2020 8315 Gross per 24 hour  Intake 860 ml  Output 950 ml  Net -90 ml   Filed Weights   12/09/20 0456 12/10/20 0230 12/12/20 0331  Weight: 76.5 kg 72.1 kg 72 kg    Examination:  GENERAL: No apparent distress.  Nontoxic. HEENT: MMM.  Vision and hearing grossly intact.  NECK: Supple.  No apparent JVD.  RESP: 93% on 3 L.  No IWOB.  Fair aeration bilaterally. CVS:  RRR. Heart sounds normal.  ABD/GI/GU: BS+. Abd soft, NTND.  MSK/EXT:  Moves extremities. No apparent deformity. No edema.  SKIN: no apparent skin lesion or wound NEURO: Awake but disoriented.  Follows some commands.  No apparent focal neuro deficit. PSYCH: Calm.  No distress or agitation.  Procedures:  Intubation and extubation  Microbiology summarized: COVID-19 PCR nonreactive.  Assessment & Plan: Acute metabolic encephalopathy-was concern about hepatic encephalopathy.  However, ammonia level within normal.  CT head without acute finding.  TSH 51.3 >>22.68>20.64.  EEG with mild diffuse encephalopathy but no seizure or epileptiform discharge.  Also history of alcohol abuse but not lately.  She is awake but somewhat disoriented.  Mental status seems to be waxing or waning. -Increase Synthroid to 112.5 mcg daily -Reduced lactulose to 20 g daily due to high output. -Completed Warnicke dose thiamine-currently on maintenance dose. -Reorientation and delirium precautions. -Aspiration and fall precautions -Reached out to neuro, Dr. Thomasena Edis on 2/5>>MRI and  EEG-ordered.   -MRI was planned for 8 AM this morning but overnight RN didn't hold the TF. MRI pushed to this PM  Acute hypoxic hypercapnic respiratory failure-no history of COPD or asthma. Aspiration pneumonia felt less likely and Unasyn discontinued.  Currently on 3 L. -Continue nebulizers -Aspiration precautions. -Minimum oxygen to keep saturation above 88%.  ABLA due to variceal  bleed.  Patient with history of cirrhosis Recent Labs    12/03/20 0629 12/05/20 0216 12/06/20 1556 12/06/20 1657 12/07/20 0421 12/07/20 0856 12/08/20 0222 12/09/20 0244 12/11/20 0126 12/12/20 0409  HGB 7.7* 8.0* 8.8* 8.5* 7.4* 6.8* 8.4* 8.9* 7.8* 8.3*  -EGD on 1/20 showed Grade III varices (banded), clotted blood in the gastric fundus, type I GEV, nonbleeding GU x3 with no stigmata of bleeding. -Received about 6u or pRBC and 2u of FFP. -SCD for VTE prophylaxis -H. pylori IgG positive but H. pylori IgM negative-previously treated? -Continue PPI therapy   Hypernatremia: Resolved. -Continue free water with tube feed.  Elevated liver enzymes: AST/ALT peaked at 134/88 but is downtrending. -Monitor intermittently  Hypothyroidism: TSH 51.3>> 22.62> 20.64 -Increase Synthroid to 112.5 mcg daily -Repeat TSH in 2 to 3 weeks  Essential hypertension: Normotensive for most part -Continue monitoring.  UTI- treated with ceftriaxone   AKI: Resolved  Goal of care:  On 12/06/2020-discussed with patient's daughter over the phone.  Patient remains encephalopathic.  Overall prognosis grim.  Discussed about her CODE STATUS and the pros and cons of resuscitation and intubation.  Patient's daughter voiced understanding but wished to continue full code for now -Palliative care recommended outpatient follow-up  Feeding difficulties partly due to encephalopathy -SLP advanced her to dysphagia 1 diet -Continue enteral nutrition via nasogastric tube Body mass index is 27.25 kg/m. Nutrition Problem: Inadequate oral intake Etiology: acute illness Signs/Symptoms: NPO status Interventions: Tube feeding,Prostat   Pressure injury: Stage II sacral and stage II left elbow Pressure Injury 11/24/20 Sacrum Medial Stage 2 -  Partial thickness loss of dermis presenting as a shallow open injury with a red, pink wound bed without slough. non-blanchable red spot on boney area on sacurm with open skin.  (Active)  11/24/20 1900  Location: Sacrum  Location Orientation: Medial  Staging: Stage 2 -  Partial thickness loss of dermis presenting as a shallow open injury with a red, pink wound bed without slough.  Wound Description (Comments): non-blanchable red spot on boney area on sacurm with open skin.  Present on Admission: No     Pressure Injury 11/29/20 Elbow Left;Posterior Stage 2 -  Partial thickness loss of dermis presenting as a shallow open injury with a red, pink wound bed without slough. (Active)  11/29/20 1000  Location: Elbow  Location Orientation: Left;Posterior  Staging: Stage 2 -  Partial thickness loss of dermis presenting as a shallow open injury with a red, pink wound bed without slough.  Wound Description (Comments):   Present on Admission:    DVT prophylaxis:  SCDs Start: 11/23/20 1654  Code Status: Full code Family Communication: Updated patient's daughter over the phone on 2/7. Level of care: Progressive Status is: Inpatient  Remains inpatient appropriate because:Altered mental status, Unsafe d/c plan, IV treatments appropriate due to intensity of illness or inability to take PO and Inpatient level of care appropriate due to severity of illness   Dispo:  Patient From: Home  Planned Disposition: Skilled Nursing Facility  Expected discharge date: 12/11/2020  Medically stable for discharge: No         Consultants:  PCCM  Palliative medicine Neurology   Sch Meds:  Scheduled Meds: . [MAR Hold] amLODipine  5 mg Per NG tube Daily  . [MAR Hold] chlorhexidine  15 mL Mouth Rinse BID  . [MAR Hold] Chlorhexidine Gluconate Cloth  6 each Topical Daily  . [MAR Hold] feeding supplement (PROSource TF)  45 mL Per Tube Daily  . [MAR Hold] fiber  1 packet Per Tube BID  . [MAR Hold] free water  200 mL Per Tube Q4H  . influenza vaccine adjuvanted  0.5 mL Intramuscular Tomorrow-1000  . [MAR Hold] insulin aspart  0-24 Units Subcutaneous Q4H  . [MAR Hold] lactulose  20 g  Per Tube Daily  . [MAR Hold] levothyroxine  100 mcg Per Tube Q0600  . [MAR Hold] mouth rinse  15 mL Mouth Rinse q12n4p  . [MAR Hold] melatonin  3 mg Per Tube QHS  . [MAR Hold] pantoprazole sodium  40 mg Per Tube BID  . [MAR Hold] rifaximin  550 mg Per Tube BID  . [MAR Hold] spironolactone  25 mg Per Tube Daily  . [MAR Hold] thiamine injection  100 mg Intravenous Daily   Continuous Infusions: . [MAR Hold] sodium chloride 10 mL/hr at 12/10/20 0903  . feeding supplement (OSMOLITE 1.5 CAL) Stopped (12/11/20 2355)  . [MAR Hold] thiamine injection 250 mg (12/11/20 1537)   PRN Meds:.[MAR Hold] sodium chloride, [MAR Hold] acetaminophen (TYLENOL) oral liquid 160 mg/5 mL, [MAR Hold] ipratropium-albuterol  Antimicrobials: Anti-infectives (From admission, onward)   Start     Dose/Rate Route Frequency Ordered Stop   12/06/20 1630  Ampicillin-Sulbactam (UNASYN) 3 g in sodium chloride 0.9 % 100 mL IVPB  Status:  Discontinued        3 g 200 mL/hr over 30 Minutes Intravenous Every 6 hours 12/06/20 1557 12/07/20 1329   12/01/20 1000  [MAR Hold]  rifaximin (XIFAXAN) tablet 550 mg        (MAR Hold since Tue 12/12/2020 at 0732.Hold Reason: Transfer to a Procedural area.)   550 mg Per Tube 2 times daily 12/01/20 0903     11/24/20 1200  cefTRIAXone (ROCEPHIN) 2 g in sodium chloride 0.9 % 100 mL IVPB        2 g 200 mL/hr over 30 Minutes Intravenous Every 24 hours 11/24/20 1047 11/30/20 1305   11/23/20 1730  cefTRIAXone (ROCEPHIN) 2 g in sodium chloride 0.9 % 100 mL IVPB  Status:  Discontinued        2 g 200 mL/hr over 30 Minutes Intravenous Every 24 hours 11/23/20 1721 11/24/20 1047       I have personally reviewed the following labs and images: CBC: Recent Labs  Lab 12/06/20 1657 12/07/20 0421 12/07/20 0856 12/08/20 0222 12/09/20 0244 12/11/20 0126 12/12/20 0409  WBC 17.1* 10.0  --  6.9  --   --   --   HGB 8.5* 7.4* 6.8* 8.4* 8.9* 7.8* 8.3*  HCT 29.9* 24.1* 20.0* 28.3* 28.9* 26.8* 28.6*  MCV  102.4* 98.0  --  99.0  --   --   --   PLT 241 221  --  282  --   --   --    BMP &GFR Recent Labs  Lab 12/06/20 1657 12/07/20 0421 12/07/20 0856 12/08/20 0222 12/09/20 0244 12/11/20 0126  NA 144 146* 147* 146* 145 144  K 4.3 4.1 3.6 3.5 3.6 4.2  CL 105 105  --  104 106 108  CO2 30 31  --  27 25 28   GLUCOSE 155* 103*  --  149* 96 132*  BUN 23 26*  --  30* 26* 21  CREATININE 0.72 0.94  --  1.14* 0.88 0.80  CALCIUM 8.8* 8.5*  --  8.9 8.9 8.6*  MG  --  2.3  --  2.1 2.2 2.4  PHOS  --  3.5  --  3.0 2.9 3.2   Estimated Creatinine Clearance: 52.7 mL/min (by C-G formula based on SCr of 0.8 mg/dL). Liver & Pancreas: Recent Labs  Lab 12/07/20 0421 12/08/20 0222 12/09/20 0244 03-Jan-2021 0126 12/12/20 0409  AST 134* 102* 98* 68* 65*  ALT 88* 77* 75* 54* 51*  ALKPHOS 186* 217* 216* 171* 176*  BILITOT 1.5* 1.9* 1.5* 1.0 1.3*  PROT 6.2* 7.3 7.3 6.5 6.7  ALBUMIN 2.5* 2.9* 2.9* 2.6* 2.6*   No results for input(s): LIPASE, AMYLASE in the last 168 hours. Recent Labs  Lab 12/06/20 1657  AMMONIA 18   Diabetic: No results for input(s): HGBA1C in the last 72 hours. Recent Labs  Lab 2021-01-03 1225 03-Jan-2021 1536 01-03-2021 2006 12/12/20 0008 12/12/20 0327  GLUCAP 104* 97 108* 131* 103*   Cardiac Enzymes: Recent Labs  Lab 12/12/20 0409  CKTOTAL 50   No results for input(s): PROBNP in the last 8760 hours. Coagulation Profile: Recent Labs  Lab 12/08/20 0222  INR 1.2   Thyroid Function Tests: Recent Labs    12/12/20 0409  TSH 20.643*   Lipid Profile: No results for input(s): CHOL, HDL, LDLCALC, TRIG, CHOLHDL, LDLDIRECT in the last 72 hours. Anemia Panel: No results for input(s): VITAMINB12, FOLATE, FERRITIN, TIBC, IRON, RETICCTPCT in the last 72 hours. Urine analysis:    Component Value Date/Time   COLORURINE YELLOW 11/23/2020 1853   APPEARANCEUR CLOUDY (A) 11/23/2020 1853   LABSPEC 1.015 11/23/2020 1853   PHURINE 5.0 11/23/2020 1853   GLUCOSEU NEGATIVE 11/23/2020  1853   HGBUR NEGATIVE 11/23/2020 1853   BILIRUBINUR NEGATIVE 11/23/2020 1853   KETONESUR NEGATIVE 11/23/2020 1853   PROTEINUR 30 (A) 11/23/2020 1853   NITRITE NEGATIVE 11/23/2020 1853   LEUKOCYTESUR MODERATE (A) 11/23/2020 1853   Sepsis Labs: Invalid input(s): PROCALCITONIN, LACTICIDVEN  Microbiology: No results found for this or any previous visit (from the past 240 hour(s)).  Radiology Studies: EEG adult  Result Date: 01-03-2021 Charlsie Quest, MD     2021/01/03  2:42 PM Patient Name: Kaylee King MRN: [id number] Epilepsy Attending: Charlsie Quest Referring Physician/Provider: Dr Candelaria Stagers Date: 01/03/21 Duration: 24.37 mins Patient history: 80yo F with ams. EEG to evaluate for seizure Level of alertness: Awake AEDs during EEG study: None Technical aspects: This EEG study was done with scalp electrodes positioned according to the 10-20 International system of electrode placement. Electrical activity was acquired at a sampling rate of 500Hz  and reviewed with a high frequency filter of 70Hz  and a low frequency filter of 1Hz . EEG data were recorded continuously and digitally stored. Description: The posterior dominant rhythm consists of 8 Hz activity of moderate voltage (25-35 uV) seen predominantly in posterior head regions, symmetric and reactive to eye opening and eye closing. EEG showed continuous generalized 5 to 6 Hz theta slowing.  Hyperventilation and photic stimulation were not performed.   ABNORMALITY -Continuous slow, generalized IMPRESSION: This study is suggestive of mild diffuse encephalopathy, nonspecific etiology. No seizures or epileptiform discharges were seen throughout the recording. Kaylee King      Kaylee King [facility]  If 7PM-7AM, please contact night-coverage [email or web address] 12/12/2020, 7:34 AM

## 2020-12-12 NOTE — Progress Notes (Signed)
Pt off unit to go to MRI. Pt NPO overnight. She is agitated this am, but denies pain and responds "I don't know" to orientation questions.   Robina Ade, RN

## 2020-12-12 NOTE — Progress Notes (Signed)
OT Cancellation Note  Patient Details Name: Kaylee King MRN: [id number] DOB: 08/10/1941   Cancelled Treatment:    Reason Eval/Treat Not Completed: Patient at procedure or test/ unavailable (Will return as schedule allows.)  [facility] MSOT, OTR/L Acute Rehab Pager: [phone] Office: [phone] 12/12/2020, 7:49 AM

## 2020-12-12 NOTE — Progress Notes (Signed)
Occupational Therapy Treatment Patient Details Name: Kaylee King MRN: [id number] DOB: 23-Aug-1941 Today's Date: 12/12/2020    History of present illness 80 year old female with history of hypothyroidism, medical noncompliance, NASH cirrhosis, presents to [facility] on 1/20 with progressive headaches, fatigue, confusion, and hypotension of unclear etiology. In the ED, had coffee ground/maroon colored hematemesis and was intubated for airway protection. CT head 1/20 was negative. Pt found to have upper GI bleed from esophageal varices and is followed by GI. EDG 1/20- Grade III esophageal varices, banded x6. Type I GE varices without bleeding. Nonbleeding gastric ulcers x3. ETT 1/20-1/27.   OT comments  Pt continues to present with decreased activity tolerance, strength, balance, and occupational participation. Pt requiring Total A +2 for bed mobility. Pt participating in grooming with Min Guard-Min A for sitting balance and Max cues for sequencing. Max A for don socks. Pt requiring Total A +2 for sit<>stand from EOB; x2. Continue to recommend dc to SNF and will continue to follow acutely as admitted.      Follow Up Recommendations  SNF    Equipment Recommendations  Wheelchair (measurements OT);Wheelchair cushion (measurements OT);Hospital bed;Other (comment) Michiel Sites)    Recommendations for Other Services Other (comment) (Palliative)    Precautions / Restrictions Precautions Precautions: Fall Precaution Comments: flexiseal, cortrak, mitts       Mobility Bed Mobility Overal bed mobility: Needs Assistance Bed Mobility: Rolling;Supine to Sit;Sit to Supine Rolling: Mod assist;Supervision   Supine to sit: Total assist;+2 for physical assistance Sit to supine: +2 for physical assistance;Mod assist   General bed mobility comments: Pt supervision for rolling when pt desires to, mod assist for trunk translation with PT/OT. Total +2 for trunk and LE management supine>sit, scooting to and from EOB.  Mod  assist for return to supine for trunk lowering and positioning in bed, pt able to lift legs into bed.  Transfers Overall transfer level: Needs assistance Equipment used: 2 person hand held assist Transfers: Sit to/from Stand Sit to Stand: Total assist;+2 physical assistance;+2 safety/equipment;From elevated surface         General transfer comment: Total +2 for power up, rise, hip extension with use of bed pads, and steadying upon standing. Standing tolerance x20 seconds, STS x2.    Balance Overall balance assessment: Needs assistance Sitting-balance support: Feet supported Sitting balance-Leahy Scale: Poor Sitting balance - Comments: min-mod assist initially, evolving to min guard-min assist for posterior truncal support. EOB sitting x8 minutes, PT encouraging upright sitting although pt preferring propping elbows on knees Postural control: Posterior lean;Left lateral lean   Standing balance-Leahy Scale: Zero Standing balance comment: total +2                           ADL either performed or assessed with clinical judgement   ADL Overall ADL's : Needs assistance/impaired     Grooming: Bed level;Wash/dry face;Minimal assistance;Min guard;Sitting Grooming Details (indicate cue type and reason): Min Guard-Min A for sitting balance. Pt able to bring wash clothe to face with cues to sequence                 Toilet Transfer: Total assistance;+2 for physical assistance;+2 for safety/equipment (sit<>Stand at EOB)           Functional mobility during ADLs: Total assistance;+2 for physical assistance (sit<>Stand) General ADL Comments: Pt performing grooming at EOB and sit<>stand twice.     Vision   Vision Assessment?:  (unable to assess)   Perception  Praxis      Cognition Arousal/Alertness: Awake/alert Behavior During Therapy: Flat affect Overall Cognitive Status: Difficult to assess Area of Impairment: Orientation;Attention;Memory;Following  commands;Safety/judgement;Awareness;Problem solving                 Orientation Level: Disoriented to;Time;Situation;Place Current Attention Level: Focused Memory: Decreased short-term memory;Decreased recall of precautions Following Commands: Follows one step commands with increased time;Follows one step commands inconsistently Safety/Judgement: Decreased awareness of safety;Decreased awareness of deficits Awareness: Intellectual Problem Solving: Slow processing;Decreased initiation;Difficulty sequencing;Requires verbal cues;Requires tactile cues General Comments: Pt wanting to "go to bed" throughout the session, but participates with verbal and tactile encouragement. Pt follows commands inconsistently secondary to desire to sleep.        Exercises     Shoulder Instructions       General Comments vss, 2LO2 satting 89-90% after mobility    Pertinent Vitals/ Pain       Pain Assessment: Faces Faces Pain Scale: Hurts little more Pain Location: generalized Pain Descriptors / Indicators: Grimacing;Discomfort;Guarding Pain Intervention(s): Monitored during session;Limited activity within patient's tolerance;Repositioned  Home Living                                          Prior Functioning/Environment              Frequency  Min 2X/week        Progress Toward Goals  OT Goals(current goals can now be found in the care plan section)  Progress towards OT goals: Progressing toward goals  Acute Rehab OT Goals Patient Stated Goal: to go home OT Goal Formulation: Patient unable to participate in goal setting Potential to Achieve Goals: Fair ADL Goals Additional ADL Goal #1: pt will tolerate palm guards for 4 hours Additional ADL Goal #2: pt will complete bed mobility total +2 mod (A) Additional ADL Goal #3: pt will demonstates sustained attention for adls task  Plan Discharge plan remains appropriate;Frequency remains appropriate     Co-evaluation    PT/OT/SLP Co-Evaluation/Treatment: Yes Reason for Co-Treatment: For patient/therapist safety;To address functional/ADL transfers   OT goals addressed during session: ADL's and self-care      AM-PAC OT "6 Clicks" Daily Activity     Outcome Measure   Help from another person eating meals?: Total Help from another person taking care of personal grooming?: Total Help from another person toileting, which includes using toliet, bedpan, or urinal?: Total Help from another person bathing (including washing, rinsing, drying)?: Total Help from another person to put on and taking off regular upper body clothing?: Total Help from another person to put on and taking off regular lower body clothing?: Total 6 Click Score: 6    End of Session Equipment Utilized During Treatment: Oxygen (2L)  OT Visit Diagnosis: Unsteadiness on feet (R26.81);Muscle weakness (generalized) (M62.81)   Activity Tolerance Patient tolerated treatment well;Patient limited by fatigue   Patient Left in bed;with call bell/phone within reach;with bed alarm set   Nurse Communication Mobility status        Time: 2536-6440 OT Time Calculation (min): 16 min  Charges: OT General Charges $OT Visit: 1 Visit  Kaylee King MSOT, OTR/L Acute Rehab Pager: [phone] Office: [phone]   Theodoro Grist Kaylee King 12/12/2020, 2:23 PM

## 2020-12-12 NOTE — Progress Notes (Signed)
The pt's coretrak is clogged. This RN and Lyndal Rainbow, RN both attempted to unclog with no success. This RN secure chatted the MD, Dr. Alanda Slim, to let him know that pt has not been able to receive meds or feeds because coretrak is clogged. [facility] team is only available MWF, so options for today are place pt back on diet and crush meds, or try NG tube.   Robina Ade, RN

## 2020-12-12 NOTE — Progress Notes (Signed)
Physical Therapy Treatment Patient Details Name: Kaylee King MRN: [id number] DOB: February 28, 1941 Today's Date: 12/12/2020    History of Present Illness 80 year old female with history of hypothyroidism, medical noncompliance, NASH cirrhosis, presents to [facility] on 1/20 with progressive headaches, fatigue, confusion, and hypotension of unclear etiology. In the ED, had coffee ground/maroon colored hematemesis and was intubated for airway protection. CT head 1/20 was negative. Pt found to have upper GI bleed from esophageal varices and is followed by GI. EDG 1/20- Grade III esophageal varices, banded x6. Type I GE varices without bleeding. Nonbleeding gastric ulcers x3. ETT 1/20-1/27.    PT Comments    Pt resting upon arrival to room, requires mod to max verbal encouragement to participate today as pt wants to lay down to return to sleep. Pt performs several mobility tasks spontaneously, like rolling, without physical assist but does not perform on command which increases pt's required assist level. Pt requiring mod-total +2 assist for bed mobility and for standing trials x2. PT continuing to recommend SNF level of care post-acutely.     Follow Up Recommendations  SNF;Supervision/Assistance - 24 hour     Equipment Recommendations  Other (comment) (tbd)    Recommendations for Other Services       Precautions / Restrictions Precautions Precautions: Fall Precaution Comments: flexiseal, cortrak, mitts Restrictions Weight Bearing Restrictions: No    Mobility  Bed Mobility Overal bed mobility: Needs Assistance Bed Mobility: Rolling;Supine to Sit;Sit to Supine Rolling: Mod assist;Supervision   Supine to sit: Total assist;+2 for physical assistance Sit to supine: +2 for physical assistance;Mod assist   General bed mobility comments: Pt supervision for rolling when pt desires to, mod assist for trunk translation with PT/OT. Total +2 for trunk and LE management supine>sit, scooting to and from EOB.   Mod assist for return to supine for trunk lowering and positioning in bed, pt able to lift legs into bed.  Transfers     Transfers: Sit to/from Stand Sit to Stand: Total assist;+2 physical assistance;+2 safety/equipment;From elevated surface         General transfer comment: Total +2 for power up, rise, hip extension with use of bed pads, and steadying upon standing. Standing tolerance x20 seconds, STS x2.  Ambulation/Gait                 Stairs             Wheelchair Mobility    Modified Rankin (Stroke Patients Only)       Balance Overall balance assessment: Needs assistance Sitting-balance support: Feet supported Sitting balance-Leahy Scale: Poor Sitting balance - Comments: min-mod assist initially, evolving to min guard-min assist for posterior truncal support. EOB sitting x8 minutes, PT encouraging upright sitting although pt preferring propping elbows on knees Postural control: Posterior lean;Left lateral lean   Standing balance-Leahy Scale: Zero Standing balance comment: total +2                            Cognition Arousal/Alertness: Awake/alert Behavior During Therapy: Flat affect Overall Cognitive Status: Difficult to assess Area of Impairment: Orientation;Attention;Memory;Following commands;Safety/judgement;Awareness;Problem solving                 Orientation Level: Disoriented to;Time;Situation;Place Current Attention Level: Focused Memory: Decreased short-term memory;Decreased recall of precautions Following Commands: Follows one step commands with increased time;Follows one step commands inconsistently Safety/Judgement: Decreased awareness of safety;Decreased awareness of deficits Awareness: Intellectual Problem Solving: Slow processing;Decreased initiation;Difficulty sequencing;Requires verbal cues;Requires tactile cues  General Comments: Pt wanting to "go to bed" throughout the session, but participates with verbal and  tactile encouragement. Pt follows commands inconsistently secondary to desire to sleep.      Exercises      General Comments General comments (skin integrity, edema, etc.): vss, 2LO2 satting 89-90% after mobility      Pertinent Vitals/Pain Pain Assessment: Faces Faces Pain Scale: Hurts little more Pain Location: generalized Pain Descriptors / Indicators: Grimacing;Discomfort;Guarding Pain Intervention(s): Limited activity within patient's tolerance;Monitored during session;Repositioned    Home Living                      Prior Function            PT Goals (current goals can now be found in the care plan section) Acute Rehab PT Goals Patient Stated Goal: to go home PT Goal Formulation: Patient unable to participate in goal setting Time For Goal Achievement: 12/15/20 Potential to Achieve Goals: Fair Progress towards PT goals: Progressing toward goals    Frequency    Min 2X/week      PT Plan Current plan remains appropriate    Co-evaluation PT/OT/SLP Co-Evaluation/Treatment: Yes Reason for Co-Treatment: For patient/therapist safety;To address functional/ADL transfers;Necessary to address cognition/behavior during functional activity PT goals addressed during session: Balance;Mobility/safety with mobility        AM-PAC PT "6 Clicks" Mobility   Outcome Measure  Help needed turning from your back to your side while in a flat bed without using bedrails?: Total Help needed moving from lying on your back to sitting on the side of a flat bed without using bedrails?: Total Help needed moving to and from a bed to a chair (including a wheelchair)?: Total Help needed standing up from a chair using your arms (e.g., wheelchair or bedside chair)?: Total Help needed to walk in hospital room?: Total Help needed climbing 3-5 steps with a railing? : Total 6 Click Score: 6    End of Session Equipment Utilized During Treatment: Oxygen Activity Tolerance: Patient  limited by fatigue Patient left: in bed;with call bell/phone within reach;with bed alarm set;with restraints reapplied;with SCD's reapplied;with nursing/sitter in room (L hand mitt) Nurse Communication: Mobility status PT Visit Diagnosis: Other abnormalities of gait and mobility (R26.89);Difficulty in walking, not elsewhere classified (R26.2)     Time: 9528-4132 PT Time Calculation (min) (ACUTE ONLY): 19 min  Charges:  $Therapeutic Activity: 8-22 mins                     Marye Round, PT Acute Rehabilitation Services Pager [phone]  Office [phone]    [name] E Christain [place] 12/12/2020, 10:16 AM

## 2020-12-12 NOTE — Anesthesia Postprocedure Evaluation (Signed)
Anesthesia Post Note  Patient: Kaylee King  Procedure(s) Performed: MRI WITH ANESTHESIA (N/A )     Anesthesia Type: MAC Level of consciousness: awake and alert Pain management: pain level controlled Vital Signs Assessment: post-procedure vital signs reviewed and stable Respiratory status: spontaneous breathing, nonlabored ventilation and respiratory function stable Cardiovascular status: blood pressure returned to baseline and stable Postop Assessment: no apparent nausea or vomiting Anesthetic complications: no   No complications documented.  Last Vitals:  Vitals:   12/12/20 0913 12/12/20 1129  BP: (!) 135/56   Pulse: 66 73  Resp: (!) 22 16  Temp:  36.6 C  SpO2: 97% 90%    Last Pain:  Vitals:   12/12/20 1129  TempSrc: Oral  PainSc:                  Lidia Collum

## 2020-12-12 NOTE — Progress Notes (Signed)
PIV consult. Assessed L upper arm PIV: appears patent.

## 2020-12-13 ENCOUNTER — Encounter (HOSPITAL_COMMUNITY): Payer: Self-pay | Admitting: Radiology

## 2020-12-13 DIAGNOSIS — L89152 Pressure ulcer of sacral region, stage 2: Secondary | ICD-10-CM | POA: Diagnosis not present

## 2020-12-13 DIAGNOSIS — I472 Ventricular tachycardia: Secondary | ICD-10-CM

## 2020-12-13 DIAGNOSIS — G9341 Metabolic encephalopathy: Secondary | ICD-10-CM | POA: Diagnosis not present

## 2020-12-13 DIAGNOSIS — L89022 Pressure ulcer of left elbow, stage 2: Secondary | ICD-10-CM | POA: Diagnosis not present

## 2020-12-13 DIAGNOSIS — E039 Hypothyroidism, unspecified: Secondary | ICD-10-CM | POA: Diagnosis not present

## 2020-12-13 LAB — GLUCOSE, CAPILLARY
Glucose-Capillary: 103 mg/dL — ABNORMAL HIGH (ref 70–99)
Glucose-Capillary: 104 mg/dL — ABNORMAL HIGH (ref 70–99)
Glucose-Capillary: 117 mg/dL — ABNORMAL HIGH (ref 70–99)
Glucose-Capillary: 159 mg/dL — ABNORMAL HIGH (ref 70–99)
Glucose-Capillary: 91 mg/dL (ref 70–99)
Glucose-Capillary: 92 mg/dL (ref 70–99)

## 2020-12-13 MED ORDER — PROSOURCE TF PO LIQD
45.0000 mL | Freq: Three times a day (TID) | ORAL | Status: DC
  Filled 2020-12-13: qty 45

## 2020-12-13 MED ORDER — LACTULOSE 10 GM/15ML PO SOLN
20.0000 g | Freq: Two times a day (BID) | ORAL | Status: DC
  Filled 2020-12-13: qty 30

## 2020-12-13 MED ORDER — OSMOLITE 1.5 CAL PO LIQD
900.0000 mL | ORAL | Status: DC
  Filled 2020-12-13: qty 948
  Filled 2020-12-13 (×2): qty 1000

## 2020-12-13 MED ORDER — DEXTROSE-NACL 5-0.9 % IV SOLN: INTRAVENOUS | Status: DC

## 2020-12-13 NOTE — Progress Notes (Signed)
Patient is agitated and states that she is in pain located in her stomach. I gave her tylenopl prn for pain and will reassess. Patient had a run of SVT for 14 beats ranging in the  130-140 bpm range before returning back to the 70s shortly after.   Mikki Harbor, RN\

## 2020-12-13 NOTE — Progress Notes (Signed)
0930 Patient is tolerating puree diet and thin liquids well. Has not coughed at all throughout feeding and has ate her entire plate.  Mikki Harbor, RN

## 2020-12-13 NOTE — Progress Notes (Signed)
Nutrition Follow-up  DOCUMENTATION CODES:   Not applicable  INTERVENTION:  Provide Ensure Enlive po BID, each supplement provides 350 kcal and 20 grams of protein.  Transition to nocturnal tube feeds via Cortrak NGT, Osmolite 1.5 cal formula at rate of 75 ml/hr x 12 hours (7pm-7am)  Provide 45 ml Prosource TF TID per tube.   Free water flushes of 200 ml q 4 hours.  Tube feeding regimen to provide 1470 kcal (89% of kcal needs), 89 grams of protein (100% of protein needs), and 1884 ml free water.  Continue Nutrisource fiber BID per tube.   NUTRITION DIAGNOSIS:   Inadequate oral intake related to acute illness as evidenced by NPO status; diet advanced, progressing  GOAL:   Patient will meet greater than or equal to 90% of their needs; met with TF  MONITOR:   TF tolerance,Diet advancement  REASON FOR ASSESSMENT:   Consult,Ventilator Enteral/tube feeding initiation and management  ASSESSMENT:   80 yo female admitted with coffee ground/maroon hematemesis with UGIB,shock, acute encephalopathy. PMH NASH cirrhosis, HTN  1/20 - Admitted, Intubated, EGD: 4 columns of large/grade 3 esophageal varicessix blands placed, clotted blood in gastric fundus-large clots removed 1/24 - cortrak placed; tip gastric  01/27 - extubated 01/29 - palliative care consulted 01/31 - SLP consult, SLP recommends continue with current plan of care (NPO) 02/02 - PCCM reconsult for AMS and respiratory distress 02/03 - SLP consult, SLP recommends continue with current plan of care (NPO)  Diet advanced yesterday to a dysphagia 1 diet with thin liquids due to Cortrak NGT being clogged and unable to be used. Meal completion has been 20-80%. Cortrak NGT replaced today. Per MD, plans to continue tube feeding until po intake improves. RD to modify tube feeding orders and transition to nocturnal feeds to encouraged po intake during the day. Will additionally order Ensure to aid in PO.   Labs and medications  reviewed.   Diet Order:   Diet Order            DIET - DYS 1 Room service appropriate? Yes; Fluid consistency: Thin  Diet effective now                 EDUCATION NEEDS:   Not appropriate for education at this time  Skin:  Skin Assessment: Reviewed RN Assessment Skin Integrity Issues:: Incisions,Stage II Stage II: sacrum, elbow Incisions: MASD (perineum)  Last BM:  2/8  Height:   Ht Readings from Last 1 Encounters:  11/23/20 '5\' 4"'  (1.626 m)    Weight:   Wt Readings from Last 1 Encounters:  12/13/20 75.8 kg    Ideal Body Weight:  54.5 kg  BMI:  Body mass index is 28.68 kg/m.  Estimated Nutritional Needs:   Kcal:  1650-1850 kcals  Protein:  85-100 g  Fluid:  >/= 1.7 L  Kaylee Parker, MS, RD, LDN RD pager number/after hours weekend pager number on Amion.

## 2020-12-13 NOTE — Progress Notes (Signed)
Pt resting comfortably on 3L [place] with no respiratory distress noted. BIPAP not needed at this time. RT will monitor as needed.

## 2020-12-13 NOTE — Progress Notes (Signed)
PROGRESS NOTE  Kaylee King FIE:[id number] DOB: 1941-09-07   PCP: Fleet Contras, MD  Patient is from: Home  DOA: 11/23/2020 LOS: 20  Chief complaints: Altered mental status and GI bleed  Brief Narrative / Interim history: 80 year old female with PMH of prior alcohol abuse, cirrhosis, hypothyroidism and noncompliance presenting with coffee-ground hematemesis, hemorrhagic shock and encephalopathy and intubated for airway protection.  She was transfused 6 units of PRBC and 2 units of FFP while in ICU.  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 She also completed 7 days of IV ceftriaxone for presumed pneumonia.  Eventually extubated and transferred to [facility] service for ongoing care.  Patient was lethargic and somewhat somnolent after extubation.  She had extensive work-up that was unrevealing except for hypothyroidism for which she has been started on Synthroid without significant improvement in her encephalopathy.    Patient has had Cortrak placed due to dysphagia/poor p.o. intake in the setting of encephalopathy.  Currently on dysphagia 1 diet and tube feed.  Patient went into respiratory failure with hypoxia and hypercapnia on 12/08/2020 but improved with brief BiPAP for about 24 hours.  There was concern about aspiration pneumonia which felt to be less likely. Unasyn discontinued.  Currently requiring 1 to 3 L.   Subjective: Seen and examined earlier this morning.  No major events overnight of this morning.  She is awake but confused.  She is oriented to self and place but not time or situation.  She responds no to pain but seems to be moaning.  Does not appear to be in respiratory distress.  She follows commands.  Objective: Vitals:   12/13/20 0500 12/13/20 0807 12/13/20 0932 12/13/20 1100  BP:  (!) 152/66 (!) 153/72 (!) 153/72  Pulse:  74  67  Resp:  17    Temp:  98 F (36.7 C)  97.8 F (36.6 C)  TempSrc:  Oral  Oral  SpO2:  94%  100%  Weight: 75.8 kg     Height:         Intake/Output Summary (Last 24 hours) at 12/13/2020 1512 Last data filed at 12/13/2020 1444 Gross per 24 hour  Intake 1888.85 ml  Output 750 ml  Net 1138.85 ml   Filed Weights   12/10/20 0230 12/12/20 0331 12/13/20 0500  Weight: 72.1 kg 72 kg 75.8 kg    Examination:  GENERAL: No apparent distress.  Nontoxic. HEENT: MMM.  Vision and hearing grossly intact.  NECK: Supple.  No apparent JVD.  RESP: 93% on 3 L.  No IWOB.  Fair aeration bilaterally. CVS:  RRR. Heart sounds normal.  ABD/GI/GU: BS+. Abd soft, NTND.  MSK/EXT:  Moves extremities. No apparent deformity. No edema.  SKIN: no apparent skin lesion or wound NEURO: Awake but confused.  Oriented to self and place but not time or situation.  Follows commands.  No apparent focal neuro deficit. PSYCH: Calm.  No distress or agitation.  Procedures:  Intubation and extubation  Microbiology summarized: COVID-19 PCR nonreactive.  Assessment & Plan: Acute metabolic encephalopathy-was concern about hepatic encephalopathy.  However, ammonia level within normal.  CT head and MRI brain without acute finding.  TSH 51.3 >>22.68>20.64.  EEG with mild diffuse encephalopathy but no seizure or epileptiform discharge.  Mental status seems to be waxing or waning. -Increase Synthroid from 100 mcg to 112.5 mcg daily on 2/8 -Increase lactulose from 20 g daily to twice daily -Completed [place] dose thiamine-currently on maintenance dose. -Reorientation and delirium precautions. -Aspiration and fall precautions -Neurology  signed off.  Acute hypoxic hypercapnic respiratory failure- currently on 3 L. -Continue nebulizers -Aspiration precautions. -Minimum oxygen to keep saturation above 88%.  ABLA due to variceal bleed: patient with history of cirrhosis Recent Labs    12/03/20 0629 12/05/20 0216 12/06/20 1556 12/06/20 1657 12/07/20 0421 12/07/20 0856 12/08/20 0222 12/09/20 0244 12/11/20 0126 12/12/20 0409  HGB 7.7* 8.0* 8.8* 8.5* 7.4*  6.8* 8.4* 8.9* 7.8* 8.3*  -EGD on 1/20 showed Grade III varices (banded), clotted blood in the gastric fundus, type I GEV, nonbleeding GU x3 with no stigmata of bleeding. -Received about 6u or pRBC and 2u of FFP. -SCD for VTE prophylaxis -H. pylori IgG positive but H. pylori IgM negative-previously treated? -Continue PPI therapy   NSVT: Patient had 14 beats of NSVT ranging from 130 to 140 bpm this morning.  -Continue telemetry -Optimize electrolytes.  Hypernatremia: Resolved. -Continue free water with tube feed.  Elevated liver enzymes: AST/ALT peaked at 134/88 but is downtrending. -Monitor intermittently  Hypothyroidism: TSH 51.3>> 22.62> 20.64 -Increase Synthroid from 100 mcg to 112.5 mcg on 2/8. -Repeat TSH in 2 to 3 weeks  Essential hypertension: Normotensive for most part -Continue monitoring.  UTI- treated with ceftriaxone  AKI: Resolved  Goal of care:  On 12/06/2020-discussed with patient's daughter over the phone.  Patient remains encephalopathic.  Overall prognosis grim.  Discussed about her CODE STATUS and the pros and cons of resuscitation and intubation.  Patient's daughter voiced understanding but wished to continue full code for now -Palliative care recommended outpatient follow-up  Feeding difficulties partly due to encephalopathy -SLP advanced her to dysphagia 1 diet -Continue enteral nutrition via nasogastric tube -Discussed about G-tube with daughter for long-term if p.o. intake does not improve over the next 24 to 48 hours. Body mass index is 28.68 kg/m. Nutrition Problem: Inadequate oral intake Etiology: acute illness Signs/Symptoms: NPO status Interventions: Tube feeding,Prostat   Pressure injury: Stage II sacral and stage II left elbow Pressure Injury 11/24/20 Sacrum Medial Stage 2 -  Partial thickness loss of dermis presenting as a shallow open injury with a red, pink wound bed without slough. non-blanchable red spot on boney area on sacurm with  open skin. (Active)  11/24/20 1900  Location: Sacrum  Location Orientation: Medial  Staging: Stage 2 -  Partial thickness loss of dermis presenting as a shallow open injury with a red, pink wound bed without slough.  Wound Description (Comments): non-blanchable red spot on boney area on sacurm with open skin.  Present on Admission: No     Pressure Injury 11/29/20 Elbow Left;Posterior Stage 2 -  Partial thickness loss of dermis presenting as a shallow open injury with a red, pink wound bed without slough. (Active)  11/29/20 1000  Location: Elbow  Location Orientation: Left;Posterior  Staging: Stage 2 -  Partial thickness loss of dermis presenting as a shallow open injury with a red, pink wound bed without slough.  Wound Description (Comments):   Present on Admission:    DVT prophylaxis:  SCDs Start: 11/23/20 1654  Code Status: Full code Family Communication: Updated patient's daughter over the phone Level of care: Progressive Status is: Inpatient  Remains inpatient appropriate because:Altered mental status, Unsafe d/c plan, IV treatments appropriate due to intensity of illness or inability to take PO and Inpatient level of care appropriate due to severity of illness   Dispo:  Patient From: Home  Planned Disposition: Skilled Nursing Facility  Expected discharge date: 12/15/2020  Medically stable for discharge: No  Consultants:  PCCM Palliative medicine Neurology   Sch Meds:  Scheduled Meds: . amLODipine  5 mg Per NG tube Daily  . chlorhexidine  15 mL Mouth Rinse BID  . Chlorhexidine Gluconate Cloth  6 each Topical Daily  . feeding supplement (PROSource TF)  45 mL Per Tube Daily  . fiber  1 packet Per Tube BID  . free water  200 mL Per Tube Q4H  . influenza vaccine adjuvanted  0.5 mL Intramuscular Tomorrow-1000  . insulin aspart  0-24 Units Subcutaneous Q4H  . lactulose  20 g Per Tube Daily  . levothyroxine  112.5 mcg Per Tube Q0600  . mouth rinse  15 mL  Mouth Rinse q12n4p  . melatonin  3 mg Per Tube QHS  . metoCLOPramide  5 mg Per Tube Q6H  . pantoprazole sodium  40 mg Per Tube BID  . rifaximin  550 mg Per Tube BID  . spironolactone  25 mg Per Tube Daily  . [START ON 12/15/2020] thiamine injection  100 mg Intravenous Daily   Continuous Infusions: . sodium chloride 10 mL/hr at 12/10/20 0903  . feeding supplement (OSMOLITE 1.5 CAL) Stopped (12/11/20 2355)  . thiamine injection 250 mg (12/12/20 1010)   PRN Meds:.sodium chloride, acetaminophen (TYLENOL) oral liquid 160 mg/5 mL, ipratropium-albuterol  Antimicrobials: Anti-infectives (From admission, onward)   Start     Dose/Rate Route Frequency Ordered Stop   12/06/20 1630  Ampicillin-Sulbactam (UNASYN) 3 g in sodium chloride 0.9 % 100 mL IVPB  Status:  Discontinued        3 g 200 mL/hr over 30 Minutes Intravenous Every 6 hours 12/06/20 1557 12/07/20 1329   12/01/20 1000  rifaximin (XIFAXAN) tablet 550 mg        550 mg Per Tube 2 times daily 12/01/20 0903     11/24/20 1200  cefTRIAXone (ROCEPHIN) 2 g in sodium chloride 0.9 % 100 mL IVPB        2 g 200 mL/hr over 30 Minutes Intravenous Every 24 hours 11/24/20 1047 11/30/20 1305   11/23/20 1730  cefTRIAXone (ROCEPHIN) 2 g in sodium chloride 0.9 % 100 mL IVPB  Status:  Discontinued        2 g 200 mL/hr over 30 Minutes Intravenous Every 24 hours 11/23/20 1721 11/24/20 1047       I have personally reviewed the following labs and images: CBC: Recent Labs  Lab 12/06/20 1657 12/07/20 0421 12/07/20 0856 12/08/20 0222 12/09/20 0244 12/11/20 0126 12/12/20 0409  WBC 17.1* 10.0  --  6.9  --   --   --   HGB 8.5* 7.4* 6.8* 8.4* 8.9* 7.8* 8.3*  HCT 29.9* 24.1* 20.0* 28.3* 28.9* 26.8* 28.6*  MCV 102.4* 98.0  --  99.0  --   --   --   PLT 241 221  --  282  --   --   --    BMP &GFR Recent Labs  Lab 12/06/20 1657 12/07/20 0421 12/07/20 0856 12/08/20 0222 12/09/20 0244 12/11/20 0126  NA 144 146* 147* 146* 145 144  K 4.3 4.1 3.6 3.5  3.6 4.2  CL 105 105  --  104 106 108  CO2 30 31  --  27 25 28   GLUCOSE 155* 103*  --  149* 96 132*  BUN 23 26*  --  30* 26* 21  CREATININE 0.72 0.94  --  1.14* 0.88 0.80  CALCIUM 8.8* 8.5*  --  8.9 8.9 8.6*  MG  --  2.3  --  2.1 2.2 2.4  PHOS  --  3.5  --  3.0 2.9 3.2   Estimated Creatinine Clearance: 54 mL/min (by C-G formula based on SCr of 0.8 mg/dL). Liver & Pancreas: Recent Labs  Lab 12/07/20 0421 12/08/20 0222 12/09/20 0244 12/11/20 0126 12/12/20 0409  AST 134* 102* 98* 68* 65*  ALT 88* 77* 75* 54* 51*  ALKPHOS 186* 217* 216* 171* 176*  BILITOT 1.5* 1.9* 1.5* 1.0 1.3*  PROT 6.2* 7.3 7.3 6.5 6.7  ALBUMIN 2.5* 2.9* 2.9* 2.6* 2.6*   No results for input(s): LIPASE, AMYLASE in the last 168 hours. Recent Labs  Lab 12/06/20 1657 12/12/20 1726  AMMONIA 18 24   Diabetic: No results for input(s): HGBA1C in the last 72 hours. Recent Labs  Lab 12/12/20 1957 12/12/20 2339 12/13/20 0329 12/13/20 0806 12/13/20 1104  GLUCAP 98 94 104* 92 159*   Cardiac Enzymes: Recent Labs  Lab 12/12/20 0409  CKTOTAL 50   No results for input(s): PROBNP in the last 8760 hours. Coagulation Profile: Recent Labs  Lab 12/08/20 0222  INR 1.2   Thyroid Function Tests: Recent Labs    12/12/20 0409  TSH 20.643*   Lipid Profile: No results for input(s): CHOL, HDL, LDLCALC, TRIG, CHOLHDL, LDLDIRECT in the last 72 hours. Anemia Panel: No results for input(s): VITAMINB12, FOLATE, FERRITIN, TIBC, IRON, RETICCTPCT in the last 72 hours. Urine analysis:    Component Value Date/Time   COLORURINE YELLOW 11/23/2020 1853   APPEARANCEUR CLOUDY (A) 11/23/2020 1853   LABSPEC 1.015 11/23/2020 1853   PHURINE 5.0 11/23/2020 1853   GLUCOSEU NEGATIVE 11/23/2020 1853   HGBUR NEGATIVE 11/23/2020 1853   BILIRUBINUR NEGATIVE 11/23/2020 1853   KETONESUR NEGATIVE 11/23/2020 1853   PROTEINUR 30 (A) 11/23/2020 1853   NITRITE NEGATIVE 11/23/2020 1853   LEUKOCYTESUR MODERATE (A) 11/23/2020 1853    Sepsis Labs: Invalid input(s): PROCALCITONIN, LACTICIDVEN  Microbiology: No results found for this or any previous visit (from the past 240 hour(s)).  Radiology Studies: No results found.    Kaylee King T. Gorden Stthomas [facility]  If 7PM-7AM, please contact night-coverage [email or web address] 12/13/2020, 3:12 PM

## 2020-12-13 NOTE — Procedures (Signed)
Cortrak  Person Inserting Tube:  Osa Craver, RD Tube Type:  Cortrak - 43 inches Tube Location:  Left nare Initial Placement:  Stomach Secured by: Bridle Technique Used to Measure Tube Placement:  Documented cm marking at nare/ corner of mouth Cortrak Secured At:  62 cm    Cortrak Tube Team Note:  Consult received to replace Cortrak feeding tube. Cortrak tube clogged and unable to unclog. Current Cortrak tube removed, bridle retained. Cortrak successfully replaced  No x-ray is required. RN may begin using tube.    If the tube becomes dislodged please keep the tube and contact the Cortrak team at [email or web address] (password [email or web address]) for replacement.  If after hours and replacement cannot be delayed, place a NG tube and confirm placement with an abdominal x-ray.    [name] MS, RDN, LDN, CNSC Registered Dietitian III Clinical Nutrition RD Pager and On-Call Pager Number Located in [place]

## 2020-12-14 DIAGNOSIS — L89152 Pressure ulcer of sacral region, stage 2: Secondary | ICD-10-CM | POA: Diagnosis not present

## 2020-12-14 DIAGNOSIS — E039 Hypothyroidism, unspecified: Secondary | ICD-10-CM | POA: Diagnosis not present

## 2020-12-14 DIAGNOSIS — G9341 Metabolic encephalopathy: Secondary | ICD-10-CM | POA: Diagnosis not present

## 2020-12-14 DIAGNOSIS — L89022 Pressure ulcer of left elbow, stage 2: Secondary | ICD-10-CM | POA: Diagnosis not present

## 2020-12-14 LAB — CBC
HCT: 28.5 % — ABNORMAL LOW (ref 36.0–46.0)
Hemoglobin: 8.2 g/dL — ABNORMAL LOW (ref 12.0–15.0)
MCH: 28.7 pg (ref 26.0–34.0)
MCHC: 28.8 g/dL — ABNORMAL LOW (ref 30.0–36.0)
MCV: 99.7 fL (ref 80.0–100.0)
Platelets: 477 10*3/uL — ABNORMAL HIGH (ref 150–400)
RBC: 2.86 MIL/uL — ABNORMAL LOW (ref 3.87–5.11)
RDW: 18.6 % — ABNORMAL HIGH (ref 11.5–15.5)
WBC: 6.1 10*3/uL (ref 4.0–10.5)
nRBC: 0.5 % — ABNORMAL HIGH (ref 0.0–0.2)

## 2020-12-14 LAB — COMPREHENSIVE METABOLIC PANEL
ALT: 45 U/L — ABNORMAL HIGH (ref 0–44)
AST: 57 U/L — ABNORMAL HIGH (ref 15–41)
Albumin: 2.7 g/dL — ABNORMAL LOW (ref 3.5–5.0)
Alkaline Phosphatase: 187 U/L — ABNORMAL HIGH (ref 38–126)
Anion gap: 10 (ref 5–15)
BUN: 18 mg/dL (ref 8–23)
CO2: 24 mmol/L (ref 22–32)
Calcium: 8.7 mg/dL — ABNORMAL LOW (ref 8.9–10.3)
Chloride: 108 mmol/L (ref 98–111)
Creatinine, Ser: 0.83 mg/dL (ref 0.44–1.00)
GFR, Estimated: >60 mL/min (ref >60)
Glucose, Bld: 101 mg/dL — ABNORMAL HIGH (ref 70–99)
Potassium: 4 mmol/L (ref 3.5–5.1)
Sodium: 142 mmol/L (ref 135–145)
Total Bilirubin: 1.2 mg/dL (ref 0.3–1.2)
Total Protein: 6.7 g/dL (ref 6.5–8.1)

## 2020-12-14 LAB — PHOSPHORUS: Phosphorus: 3.3 mg/dL (ref 2.5–4.6)

## 2020-12-14 LAB — MAGNESIUM: Magnesium: 2.2 mg/dL (ref 1.7–2.4)

## 2020-12-14 LAB — GLUCOSE, CAPILLARY
Glucose-Capillary: 106 mg/dL — ABNORMAL HIGH (ref 70–99)
Glucose-Capillary: 102 mg/dL — ABNORMAL HIGH (ref 70–99)
Glucose-Capillary: 96 mg/dL (ref 70–99)
Glucose-Capillary: 101 mg/dL — ABNORMAL HIGH (ref 70–99)
Glucose-Capillary: 93 mg/dL (ref 70–99)

## 2020-12-14 MED ORDER — AMLODIPINE BESYLATE 5 MG PO TABS
5.0000 mg | ORAL_TABLET | Freq: Every day | ORAL | Status: DC
  Administered 2020-12-16 – 2020-12-19 (×2): 5 mg via ORAL
  Filled 2020-12-14 (×3): qty 1

## 2020-12-14 MED ORDER — LEVOTHYROXINE SODIUM 75 MCG PO TABS
112.5000 ug | ORAL_TABLET | Freq: Every day | ORAL | Status: DC
  Administered 2020-12-15 – 2020-12-18 (×4): 112.5 ug via ORAL
  Filled 2020-12-14 (×5): qty 2

## 2020-12-14 MED ORDER — SPIRONOLACTONE 25 MG PO TABS
25.0000 mg | ORAL_TABLET | Freq: Every day | ORAL | Status: DC
  Administered 2020-12-16 – 2020-12-19 (×2): 25 mg via ORAL
  Filled 2020-12-14 (×3): qty 1

## 2020-12-14 MED ORDER — INSULIN ASPART 100 UNIT/ML ~~LOC~~ SOLN
0.0000 [IU] | Freq: Three times a day (TID) | SUBCUTANEOUS | Status: DC
  Administered 2020-12-16 – 2020-12-17 (×2): 2 [IU] via SUBCUTANEOUS

## 2020-12-14 MED ORDER — PANTOPRAZOLE SODIUM 40 MG PO TBEC
40.0000 mg | DELAYED_RELEASE_TABLET | Freq: Two times a day (BID) | ORAL | Status: DC
  Administered 2020-12-14 – 2020-12-19 (×5): 40 mg via ORAL
  Filled 2020-12-14 (×7): qty 1

## 2020-12-14 MED ORDER — MELATONIN 3 MG PO TABS
3.0000 mg | ORAL_TABLET | Freq: Every day | ORAL | Status: DC
  Administered 2020-12-14 – 2020-12-17 (×4): 3 mg via ORAL
  Filled 2020-12-14 (×4): qty 1

## 2020-12-14 MED ORDER — LACTULOSE 10 GM/15ML PO SOLN
20.0000 g | Freq: Two times a day (BID) | ORAL | Status: DC
  Administered 2020-12-14 – 2020-12-19 (×4): 20 g via ORAL
  Filled 2020-12-14 (×7): qty 30

## 2020-12-14 MED ORDER — ACETAMINOPHEN 325 MG PO TABS
650.0000 mg | ORAL_TABLET | Freq: Four times a day (QID) | ORAL | Status: DC | PRN
  Administered 2020-12-18 – 2020-12-19 (×2): 650 mg via ORAL
  Filled 2020-12-14 (×3): qty 2

## 2020-12-14 MED ORDER — RIFAXIMIN 550 MG PO TABS
550.0000 mg | ORAL_TABLET | Freq: Two times a day (BID) | ORAL | Status: DC
Start: 2020-12-14 — End: 2020-12-20
  Administered 2020-12-14 – 2020-12-19 (×5): 550 mg via ORAL
  Filled 2020-12-14 (×11): qty 1

## 2020-12-14 NOTE — Progress Notes (Signed)
PROGRESS NOTE  Kaylee King CBJ:[id number] DOB: Nov 22, 1940   PCP: Fleet Contras, MD  Patient is from: Home  DOA: 11/23/2020 LOS: 21  Chief complaints: Altered mental status and GI bleed  Brief Narrative / Interim history: 80 year old female with PMH of prior alcohol abuse, cirrhosis, hypothyroidism and noncompliance presenting with coffee-ground hematemesis, hemorrhagic shock and encephalopathy and intubated for airway protection.  She was transfused 6 units of PRBC and 2 units of FFP while in ICU.  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 She also completed 7 days of IV ceftriaxone for presumed pneumonia.  Eventually extubated and transferred to [facility] for ongoing care.  Patient was lethargic and somewhat somnolent after extubation.  She had extensive work-up that was unrevealing except for hypothyroidism for which she has been started on Synthroid without significant improvement in her encephalopathy.    Patient went into respiratory failure with hypoxia and hypercapnia on 12/08/2020 but improved with brief BiPAP for about 24 hours.  There was concern about aspiration pneumonia which felt to be less likely. Unasyn discontinued.  Currently requiring 1 to 3 L.  SLP upgraded her to dysphagia 2 diet on 2/9.   Subjective: Seen and examined earlier this morning.  Patient pulled her NG tube yesterday in the evening.  She is working with therapy this morning.  She was out of the bed sitting on bedside chair with the help of therapy.  She looks quite alert but only oriented to self and her birthday.  Follows commands appropriately.  She responds no to pain or trouble breathing.  Objective: Vitals:   12/13/20 2351 12/14/20 0500 12/14/20 0735 12/14/20 1101  BP: 137/67   138/64  Pulse: 75   73  Resp: 20   17  Temp: 98.2 F (36.8 C)  98 F (36.7 C) 98.2 F (36.8 C)  TempSrc: Oral  Oral Oral  SpO2: 98%   95%  Weight:  75.9 kg    Height:        Intake/Output Summary (Last 24  hours) at 12/14/2020 1400 Last data filed at 12/14/2020 1100 Gross per 24 hour  Intake 798.34 ml  Output 1700 ml  Net -901.66 ml   Filed Weights   12/12/20 0331 12/13/20 0500 12/14/20 0500  Weight: 72 kg 75.8 kg 75.9 kg    Examination:  GENERAL: No apparent distress.  Sitting on bedside chair. HEENT: MMM.  Vision and hearing grossly intact.  NECK: Supple.  No apparent JVD.  RESP: 98% on 3 L.  No IWOB.  Fair aeration bilaterally. CVS:  RRR. Heart sounds normal.  ABD/GI/GU: BS+. Abd soft, NTND.  MSK/EXT:  Moves extremities. No apparent deformity. No edema.  SKIN: no apparent skin lesion or wound NEURO: Awake and alert.  Oriented to self and her birthday.  Follows commands.  No apparent focal neuro deficit. PSYCH: Calm.  No distress or agitation.  Procedures:  Intubation and extubation  Microbiology summarized: COVID-19 PCR nonreactive.  Assessment & Plan: Acute metabolic encephalopathy-was concern about hepatic encephalopathy.  However, ammonia level within normal.  CT head and MRI brain without acute finding.  TSH 51.3 >>22.68>20.64.  EEG with mild diffuse encephalopathy but no seizure or epileptiform discharge.  Mental status seems to be waxing or waning. -Increased Synthroid from 100 mcg to 112.5 mcg daily on 2/8 -Increased lactulose from 20 g daily to twice daily on 2/9 -Completed Warnicke dose thiamine-currently on maintenance dose. -Reorientation and delirium precautions. -Aspiration and fall precautions -Neurology signed off.  Acute hypoxic  hypercapnic respiratory failure- currently on 1 to 3 L by . -Continue nebulizers -Aspiration precautions. -Minimum oxygen to keep saturation above 88%.  ABLA due to variceal bleed: patient with history of cirrhosis Recent Labs    12/05/20 0216 12/06/20 1556 12/06/20 1657 12/07/20 0421 12/07/20 0856 12/08/20 0222 12/09/20 0244 12/11/20 0126 12/12/20 0409 12/14/20 0244  HGB 8.0* 8.8* 8.5* 7.4* 6.8* 8.4* 8.9* 7.8* 8.3*  8.2*  -EGD on 1/20 showed Grade III varices (banded), clotted blood in the gastric fundus, type I GEV, nonbleeding GU x3 with no stigmata of bleeding. -Received about 6u or pRBC and 2u of FFP. -SCD for VTE prophylaxis -H. pylori IgG positive but H. pylori IgM negative-previously treated? -Continue PPI therapy   NSVT: Patient had 14 beats of NSVT ranging from 130 to 140 bpm on 2/9. -Continue telemetry -Optimize electrolytes.  Hypernatremia: Resolved. -Continue free water with tube feed.  Elevated liver enzymes: AST/ALT peaked at 134/88 but is downtrending. -Monitor intermittently  Hypothyroidism: TSH 51.3>> 22.62> 20.64 -Increase Synthroid from 100 mcg to 112.5 mcg on 2/8. -Repeat TSH in 2 to 3 weeks  Essential hypertension: Normotensive for most part -Continue monitoring.  UTI- treated with ceftriaxone  AKI: Resolved  Goal of care:  On 12/06/2020-discussed with patient's daughter over the phone.  Patient remains encephalopathic.  Overall prognosis grim.  Discussed about her CODE STATUS and the pros and cons of resuscitation and intubation.  Patient's daughter voiced understanding but wished to continue full code for now -Palliative care recommended outpatient follow-up  Feeding difficulties partly due to encephalopathy -Pulled out NG tube on 2/9. -SLP advanced her to dysphagia 2.  We may able to avoid NG tube if she demonstrates good p.o. intake. -Assistance with feeding. Body mass index is 28.72 kg/m. Nutrition Problem: Inadequate oral intake Etiology: acute illness Signs/Symptoms: NPO status Interventions: Tube feeding,Prostat   Pressure injury: Stage II sacral and stage II left elbow Pressure Injury 11/24/20 Sacrum Medial Stage 2 -  Partial thickness loss of dermis presenting as a shallow open injury with a red, pink wound bed without slough. non-blanchable red spot on boney area on sacurm with open skin. (Active)  11/24/20 1900  Location: Sacrum  Location  Orientation: Medial  Staging: Stage 2 -  Partial thickness loss of dermis presenting as a shallow open injury with a red, pink wound bed without slough.  Wound Description (Comments): non-blanchable red spot on boney area on sacurm with open skin.  Present on Admission: No     Pressure Injury 11/29/20 Elbow Left;Posterior Stage 2 -  Partial thickness loss of dermis presenting as a shallow open injury with a red, pink wound bed without slough. (Active)  11/29/20 1000  Location: Elbow  Location Orientation: Left;Posterior  Staging: Stage 2 -  Partial thickness loss of dermis presenting as a shallow open injury with a red, pink wound bed without slough.  Wound Description (Comments):   Present on Admission:    DVT prophylaxis:  SCDs Start: 11/23/20 1654  Code Status: Full code Family Communication: Updated patient's daughter over the phone on 2/9 Level of care: Progressive Status is: Inpatient  Remains inpatient appropriate because:Altered mental status, Unsafe d/c plan and Inpatient level of care appropriate due to severity of illness   Dispo:  Patient From: Home  Planned Disposition: Skilled Nursing Facility  Expected discharge date: 12/15/2020  Medically stable for discharge: No         Consultants:  PCCM Palliative medicine Neurology   Sch Meds:  Scheduled Meds: .  amLODipine  5 mg Per NG tube Daily  . chlorhexidine  15 mL Mouth Rinse BID  . Chlorhexidine Gluconate Cloth  6 each Topical Daily  . feeding supplement (OSMOLITE 1.5 CAL)  900 mL Per Tube Q24H  . feeding supplement (PROSource TF)  45 mL Per Tube TID  . fiber  1 packet Per Tube BID  . free water  200 mL Per Tube Q4H  . influenza vaccine adjuvanted  0.5 mL Intramuscular Tomorrow-1000  . insulin aspart  0-24 Units Subcutaneous Q4H  . lactulose  20 g Per Tube BID  . levothyroxine  112.5 mcg Per Tube Q0600  . mouth rinse  15 mL Mouth Rinse q12n4p  . melatonin  3 mg Per Tube QHS  . pantoprazole sodium  40  mg Per Tube BID  . rifaximin  550 mg Per Tube BID  . spironolactone  25 mg Per Tube Daily  . [START ON 12/15/2020] thiamine injection  100 mg Intravenous Daily   Continuous Infusions: . sodium chloride 10 mL/hr at 12/10/20 0903  . thiamine injection 250 mg (12/14/20 1048)   PRN Meds:.sodium chloride, acetaminophen (TYLENOL) oral liquid 160 mg/5 mL, ipratropium-albuterol  Antimicrobials: Anti-infectives (From admission, onward)   Start     Dose/Rate Route Frequency Ordered Stop   12/06/20 1630  Ampicillin-Sulbactam (UNASYN) 3 g in sodium chloride 0.9 % 100 mL IVPB  Status:  Discontinued        3 g 200 mL/hr over 30 Minutes Intravenous Every 6 hours 12/06/20 1557 12/07/20 1329   12/01/20 1000  rifaximin (XIFAXAN) tablet 550 mg        550 mg Per Tube 2 times daily 12/01/20 0903     11/24/20 1200  cefTRIAXone (ROCEPHIN) 2 g in sodium chloride 0.9 % 100 mL IVPB        2 g 200 mL/hr over 30 Minutes Intravenous Every 24 hours 11/24/20 1047 11/30/20 1305   11/23/20 1730  cefTRIAXone (ROCEPHIN) 2 g in sodium chloride 0.9 % 100 mL IVPB  Status:  Discontinued        2 g 200 mL/hr over 30 Minutes Intravenous Every 24 hours 11/23/20 1721 11/24/20 1047       I have personally reviewed the following labs and images: CBC: Recent Labs  Lab 12/08/20 0222 12/09/20 0244 12/11/20 0126 12/12/20 0409 12/14/20 0244  WBC 6.9  --   --   --  6.1  HGB 8.4* 8.9* 7.8* 8.3* 8.2*  HCT 28.3* 28.9* 26.8* 28.6* 28.5*  MCV 99.0  --   --   --  99.7  PLT 282  --   --   --  477*   BMP &GFR Recent Labs  Lab 12/08/20 0222 12/09/20 0244 12/11/20 0126 12/14/20 0244  NA 146* 145 144 142  K 3.5 3.6 4.2 4.0  CL 104 106 108 108  CO2 27 25 28 24   GLUCOSE 149* 96 132* 101*  BUN 30* 26* 21 18  CREATININE 1.14* 0.88 0.80 0.83  CALCIUM 8.9 8.9 8.6* 8.7*  MG 2.1 2.2 2.4 2.2  PHOS 3.0 2.9 3.2 3.3   Estimated Creatinine Clearance: 52.1 mL/min (by C-G formula based on SCr of 0.83 mg/dL). Liver &  Pancreas: Recent Labs  Lab 12/08/20 0222 12/09/20 0244 12/11/20 0126 12/12/20 0409 12/14/20 0244  AST 102* 98* 68* 65* 57*  ALT 77* 75* 54* 51* 45*  ALKPHOS 217* 216* 171* 176* 187*  BILITOT 1.9* 1.5* 1.0 1.3* 1.2  PROT 7.3 7.3 6.5 6.7 6.7  ALBUMIN 2.9* 2.9* 2.6* 2.6* 2.7*   No results for input(s): LIPASE, AMYLASE in the last 168 hours. Recent Labs  Lab 12/12/20 1726  AMMONIA 24   Diabetic: No results for input(s): HGBA1C in the last 72 hours. Recent Labs  Lab 12/13/20 1955 12/13/20 2347 12/14/20 0458 12/14/20 0738 12/14/20 1148  GLUCAP 103* 91 106* 102* 96   Cardiac Enzymes: Recent Labs  Lab 12/12/20 0409  CKTOTAL 50   No results for input(s): PROBNP in the last 8760 hours. Coagulation Profile: Recent Labs  Lab 12/08/20 0222  INR 1.2   Thyroid Function Tests: Recent Labs    12/12/20 0409  TSH 20.643*   Lipid Profile: No results for input(s): CHOL, HDL, LDLCALC, TRIG, CHOLHDL, LDLDIRECT in the last 72 hours. Anemia Panel: No results for input(s): VITAMINB12, FOLATE, FERRITIN, TIBC, IRON, RETICCTPCT in the last 72 hours. Urine analysis:    Component Value Date/Time   COLORURINE YELLOW 11/23/2020 1853   APPEARANCEUR CLOUDY (A) 11/23/2020 1853   LABSPEC 1.015 11/23/2020 1853   PHURINE 5.0 11/23/2020 1853   GLUCOSEU NEGATIVE 11/23/2020 1853   HGBUR NEGATIVE 11/23/2020 1853   BILIRUBINUR NEGATIVE 11/23/2020 1853   KETONESUR NEGATIVE 11/23/2020 1853   PROTEINUR 30 (A) 11/23/2020 1853   NITRITE NEGATIVE 11/23/2020 1853   LEUKOCYTESUR MODERATE (A) 11/23/2020 1853   Sepsis Labs: Invalid input(s): PROCALCITONIN, LACTICIDVEN  Microbiology: No results found for this or any previous visit (from the past 240 hour(s)).  Radiology Studies: No results found.    Demarea Lorey T. Sakia Schrimpf [facility]  If 7PM-7AM, please contact night-coverage [email or web address] 12/14/2020, 2:00 PM

## 2020-12-14 NOTE — Progress Notes (Signed)
Occupational Therapy Treatment Patient Details Name: Kaylee King MRN: [id number] DOB: 1941/05/02 Today's Date: 12/14/2020    History of present illness 80 year old female with history of hypothyroidism, medical noncompliance, NASH cirrhosis, presents to [facility] on 1/20 with progressive headaches, fatigue, confusion, and hypotension of unclear etiology. In the ED, had coffee ground/maroon colored hematemesis and was intubated for airway protection. CT head 1/20 was negative. Pt found to have upper GI bleed from esophageal varices and is followed by GI. EDG 1/20- Grade III esophageal varices, banded x6. Type I GE varices without bleeding. Nonbleeding gastric ulcers x3. ETT 1/20-1/27.   OT comments  Pt making good progress towards OT goals this session. Pt able to progress OOB to recliner this session with MIN A +2 with Rw with pt able to ambulate ~ 3 ft to recliner. Pt continue to present with cognitive deficits, decreased activity tolerance and generalized weakness impacting pts ability to complete BADLs independently. Pt continues to be most limited by cognition in the areas of orientation, attention, memory, following commands, safety/judgement, awareness and Problem solving. This was pts first time up OOB, therefore DC plan and listed DME currently remains appropriate, but will continue to follow acutely and update DC plan/ DME recs pending continued pt progression with therapies.    pt on 3L at start of session with sats dropping to 83% with mobility bumped pt up to 4L for mobility but able to titrate O2 back down to 3L at end of session with sats 100% from chair. BP from sitting in chair 148/65.  Follow Up Recommendations  SNF    Equipment Recommendations  Wheelchair (measurements OT);Wheelchair cushion (measurements OT);Hospital bed;Other (comment) Michiel Sites)    Recommendations for Other Services      Precautions / Restrictions Precautions Precautions: Fall Precaution Comments: flexiseal,  mitts Restrictions Weight Bearing Restrictions: No       Mobility Bed Mobility Overal bed mobility: Needs Assistance Bed Mobility: Supine to Sit     Supine to sit: Mod assist;+2 for physical assistance;HOB elevated     General bed mobility comments: pt assisting more with bed mobility this session with pt able to exit to R side of bed needing assist to maneuver BLEs to EOB, cues for hand placement on bed rail to assist with elevatink trunk into sitting  Transfers Overall transfer level: Needs assistance Equipment used: Rolling walker (2 wheeled) Transfers: Sit to/from Stand Sit to Stand: Min assist;+2 physical assistance;From elevated surface         General transfer comment: pt able to rise from EOB with MIN A +2, initial steadying assist needed for safety, pt able to ambulate ~ 3 ft    Balance Overall balance assessment: Needs assistance Sitting-balance support: Feet supported;Single extremity supported Sitting balance-Leahy Scale: Poor Sitting balance - Comments: pt able to maintain static sitting balance with one UE supported on bed rail with close min guard   Standing balance support: Bilateral upper extremity supported Standing balance-Leahy Scale: Poor Standing balance comment: reliant on BUE support of RW and external assist                           ADL either performed or assessed with clinical judgement   ADL Overall ADL's : Needs assistance/impaired     Grooming: Brushing hair;Sitting;Total assistance Grooming Details (indicate cue type and reason): pt required total A for UB grooming task of bruhsing hair as pt states " no you do it"  Upper Body Dressing : Maximal assistance;Sitting Upper Body Dressing Details (indicate cue type and reason): to down gown as back side cover Lower Body Dressing: Total assistance;Bed level Lower Body Dressing Details (indicate cue type and reason): to don socks Toilet Transfer: Minimal  assistance;RW;Ambulation;Cueing for safety Toilet Transfer Details (indicate cue type and reason): simulated via functional mobility with RW, MIN A +2 to rise from EOB, assist to manage RW and cues for safety in relation to reaching back for chair         Functional mobility during ADLs: Minimal assistance;Rolling walker;Cueing for safety General ADL Comments: pt making good progress this session able to progress OOB to recliner with MIN A +2, pt continues to present with cogntive deficits, decreased activity tolerance, impaired safety awareness and impaired attention     Vision       Perception     Praxis      Cognition Arousal/Alertness: Awake/alert Behavior During Therapy: WFL for tasks assessed/performed Overall Cognitive Status: Impaired/Different from baseline Area of Impairment: Orientation;Attention;Memory;Following commands;Safety/judgement;Awareness;Problem solving                 Orientation Level: Disoriented to;Place;Time;Situation (pt states its March 2020 and thinks she is at a school) Current Attention Level: Focused Memory: Decreased short-term memory (unable to recall correct date stated at end of session) Following Commands: Follows one step commands consistently;Follows multi-step commands with increased time Safety/Judgement: Decreased awareness of safety;Decreased awareness of deficits Awareness: Intellectual Problem Solving: Slow processing;Decreased initiation;Difficulty sequencing;Requires verbal cues;Requires tactile cues General Comments: pt following all commands this session, pt continues to be disoriented but pleasant and willing to participate. pt with noted STM deficits unable to recall any education offered during session. continued deficits noted in safety awareness and attention        Exercises     Shoulder Instructions       General Comments pt on 3L at start of session with sats dropping to 83% with mobility bumped pt up to 4L for  mobility but able to titrate O2 back down to 3L at end of session with sats 100% from chair. BP from sitting in chair 148/65.    Pertinent Vitals/ Pain       Pain Assessment: No/denies pain  Home Living                                          Prior Functioning/Environment              Frequency  Min 2X/week        Progress Toward Goals  OT Goals(current goals can now be found in the care plan section)  Progress towards OT goals: Progressing toward goals  Acute Rehab OT Goals Patient Stated Goal: to go home Potential to Achieve Goals: Fair  Plan Discharge plan remains appropriate;Frequency remains appropriate    Co-evaluation                 AM-PAC OT "6 Clicks" Daily Activity     Outcome Measure   Help from another person eating meals?: A Little Help from another person taking care of personal grooming?: A Lot Help from another person toileting, which includes using toliet, bedpan, or urinal?: A Lot Help from another person bathing (including washing, rinsing, drying)?: A Lot Help from another person to put on and taking off regular upper body clothing?: A Lot Help from another  person to put on and taking off regular lower body clothing?: Total 6 Click Score: 12    End of Session Equipment Utilized During Treatment: Gait belt;Rolling walker;Oxygen;Other (comment) (3-4L)  OT Visit Diagnosis: Unsteadiness on feet (R26.81);Muscle weakness (generalized) (M62.81)   Activity Tolerance Patient tolerated treatment well   Patient Left in chair;with call bell/phone within reach;with chair alarm set   Nurse Communication Mobility status;Other (comment) (stand pivot transfer back to bed with RW, +2 assist)        Time: 2706-2376 OT Time Calculation (min): 23 min  Charges: OT General Charges $OT Visit: 1 Visit OT Treatments $Self Care/Home Management : 23-37 mins  Lenor Derrick., COTA/L Acute Rehabilitation  Services [phone]    Barron Schmid 12/14/2020, 10:45 AM

## 2020-12-14 NOTE — Progress Notes (Signed)
  Speech Language Pathology Treatment: Dysphagia  Patient Details Name: Kaylee King MRN: [id number] DOB: Jul 01, 1941 Today's Date: 12/14/2020 Time: 7062-3762 SLP Time Calculation (min) (ACUTE ONLY): 11 min  Assessment / Plan / Recommendation Clinical Impression  Pt was seen for dysphagia treatment. She was alert and cooperative during the session, and stated that she was anxious to eat and drink anything. Pt tolerated thin liquids and dysphagia 1, dysphagia 2, and regular texture solids without overt s/sx of aspiration. Mastication was prolonged with regular texture solids, likely secondary to edentulous status, but functional for dysphagia 2. No significant oral residue was noted. A dysphagia 2 diet with thin liquids will be initiated at this time. SLP will follow to assess diet tolerance and for possible diet advancement.    HPI HPI: Pt is an 80 year old female with history of hypothyroidism noncompliant who presented with progressive headaches, fatigue, confusion, and hypotension of unclear etiology. In the ED, had coffee ground/maroon colored hematemesis and was intubated for airway protection. CXR: Interim placement of feeding tube, its tip is below left hemidiaphragm. CT head 1/20 was negative. Pt found to have upper GI bleed from esophageal varices and is followed by GI. EDG 1/20- Grade III esophageal varices, banded x6. Type I GE varices without bleeding. Nonbleeding gastric ulcers x3. ETT 1/20-1/27. Cortrak placed on 1/24. CXR 2/6: Markedly improved aeration of both lungs.  2. Persistent mild interstitial pattern and bilateral pleural  effusions, right greater than left. MRI 2/8: No evidence of  acute infarct or other acute intracranial abnormality.      SLP Plan  Continue with current plan of care       Recommendations  Diet recommendations: Dysphagia 2 (fine chop);Thin liquid Liquids provided via: Cup;Straw Medication Administration: Whole meds with puree Supervision: Staff to assist  with self feeding;Full supervision/cueing for compensatory strategies Compensations: Slow rate;Small sips/bites Postural Changes and/or Swallow Maneuvers: Seated upright 90 degrees                Oral Care Recommendations: Oral care BID Follow up Recommendations: Skilled Nursing facility SLP Visit Diagnosis: Dysphagia, unspecified (R13.10) Plan: Continue with current plan of care       Meron Bocchino I. Vear Clock, MS, CCC-SLP Acute Rehabilitation Services Office number [phone] Pager [phone]                Scheryl Marten 12/14/2020, 1:30 PM

## 2020-12-15 DIAGNOSIS — G9341 Metabolic encephalopathy: Secondary | ICD-10-CM | POA: Diagnosis not present

## 2020-12-15 DIAGNOSIS — E039 Hypothyroidism, unspecified: Secondary | ICD-10-CM | POA: Diagnosis not present

## 2020-12-15 DIAGNOSIS — L89022 Pressure ulcer of left elbow, stage 2: Secondary | ICD-10-CM | POA: Diagnosis not present

## 2020-12-15 DIAGNOSIS — L89152 Pressure ulcer of sacral region, stage 2: Secondary | ICD-10-CM | POA: Diagnosis not present

## 2020-12-15 LAB — CBC
HCT: 27.8 % — ABNORMAL LOW (ref 36.0–46.0)
Hemoglobin: 8.3 g/dL — ABNORMAL LOW (ref 12.0–15.0)
MCH: 29.4 pg (ref 26.0–34.0)
MCHC: 29.9 g/dL — ABNORMAL LOW (ref 30.0–36.0)
MCV: 98.6 fL (ref 80.0–100.0)
Platelets: 473 10*3/uL — ABNORMAL HIGH (ref 150–400)
RBC: 2.82 MIL/uL — ABNORMAL LOW (ref 3.87–5.11)
RDW: 18.5 % — ABNORMAL HIGH (ref 11.5–15.5)
WBC: 6.8 10*3/uL (ref 4.0–10.5)
nRBC: 0.3 % — ABNORMAL HIGH (ref 0.0–0.2)

## 2020-12-15 LAB — COMPREHENSIVE METABOLIC PANEL
ALT: 38 U/L (ref 0–44)
AST: 46 U/L — ABNORMAL HIGH (ref 15–41)
Albumin: 2.7 g/dL — ABNORMAL LOW (ref 3.5–5.0)
Alkaline Phosphatase: 172 U/L — ABNORMAL HIGH (ref 38–126)
Anion gap: 9 (ref 5–15)
BUN: 14 mg/dL (ref 8–23)
CO2: 24 mmol/L (ref 22–32)
Calcium: 8.6 mg/dL — ABNORMAL LOW (ref 8.9–10.3)
Chloride: 108 mmol/L (ref 98–111)
Creatinine, Ser: 0.87 mg/dL (ref 0.44–1.00)
GFR, Estimated: >60 mL/min (ref >60)
Glucose, Bld: 101 mg/dL — ABNORMAL HIGH (ref 70–99)
Potassium: 3.7 mmol/L (ref 3.5–5.1)
Sodium: 141 mmol/L (ref 135–145)
Total Bilirubin: 1.4 mg/dL — ABNORMAL HIGH (ref 0.3–1.2)
Total Protein: 6.7 g/dL (ref 6.5–8.1)

## 2020-12-15 LAB — MAGNESIUM: Magnesium: 2 mg/dL (ref 1.7–2.4)

## 2020-12-15 LAB — GLUCOSE, CAPILLARY
Glucose-Capillary: 118 mg/dL — ABNORMAL HIGH (ref 70–99)
Glucose-Capillary: 119 mg/dL — ABNORMAL HIGH (ref 70–99)
Glucose-Capillary: 88 mg/dL (ref 70–99)
Glucose-Capillary: 92 mg/dL (ref 70–99)
Glucose-Capillary: 95 mg/dL (ref 70–99)

## 2020-12-15 LAB — PHOSPHORUS: Phosphorus: 3.4 mg/dL (ref 2.5–4.6)

## 2020-12-15 MED ORDER — ENSURE ENLIVE PO LIQD
237.0000 mL | Freq: Three times a day (TID) | ORAL | Status: DC
  Administered 2020-12-15 – 2020-12-16 (×4): 237 mL via ORAL

## 2020-12-15 MED ORDER — KCL IN DEXTROSE-NACL 10-5-0.45 MEQ/L-%-% IV SOLN
INTRAVENOUS | Status: DC
  Administered 2020-12-16: 65 mL/h via INTRAVENOUS
  Filled 2020-12-15 (×8): qty 1000

## 2020-12-15 NOTE — [place] FL2 (Signed)
[place] MEDICAID FL2 LEVEL OF CARE SCREENING TOOL     IDENTIFICATION  Patient Name: Kaylee King Birthdate: Aug 30, 1941 Sex: female Admission Date (Current Location): 11/23/2020  [facility] and [place] Number:  Producer, television/film/video and Address:  [facility] [place], [address] [place], [place] [address]      Provider Number: [id number]  Attending Physician Name and Address:  Almon Hercules, MD  Relative Name and Phone Number:       Current Level of Care: Hospital Recommended Level of Care: Skilled Nursing Facility Prior Approval Number:    Date Approved/Denied:   PASRR Number: [id number]  Discharge Plan: SNF    Current Diagnoses: Patient Active Problem List   Diagnosis Date Noted  . Acute hypoxemic respiratory failure (HCC)   . Respiratory failure (HCC)   . Pressure injury of skin 11/27/2020  . Acute metabolic encephalopathy 11/23/2020  . UGI bleed   . 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 Portal hypertension (HCC)   . Acute gastric ulcer without hemorrhage or perforation   . Hypothyroid 01/01/2012  . HTN (hypertension) 01/01/2012    Orientation RESPIRATION BLADDER Height & Weight     Self  O2 Incontinent,External catheter Weight: 166 lb 14.2 oz (75.7 kg) Height:  5\' 4"  (162.6 cm)  BEHAVIORAL SYMPTOMS/MOOD NEUROLOGICAL BOWEL NUTRITION STATUS      Incontinent (Retal pouch) Diet (please see discharge summary)  AMBULATORY STATUS COMMUNICATION OF NEEDS Skin   Supervision Verbally  (Pressure Injury Sacrum Medial Stage II, Pressure Injury Elbow Posterior Stage II, would /incision (MASD))                       Personal Care Assistance Level of Assistance  Bathing,Feeding,Dressing Bathing Assistance: Limited assistance Feeding assistance: Limited assistance Dressing Assistance: Limited assistance     Functional Limitations Info  Sight,Hearing,Speech Sight Info: Adequate Hearing Info: Adequate Speech Info: Adequate     SPECIAL CARE FACTORS FREQUENCY  PT (By licensed PT),OT (By licensed OT)     PT Frequency: 5x per weel OT Frequency: 5x per week            Contractures Contractures Info: Not present    Additional Factors Info  Code Status,Allergies Code Status Info: FULL Allergies Info: NKA           Current Medications (12/15/2020):  This is the current hospital active medication list Current Facility-Administered Medications  Medication Dose Route Frequency Provider Last Rate Last Admin  . 0.9 %  sodium chloride infusion   Intravenous PRN 02/12/2021, MD 10 mL/hr at 12/10/20 0903 New Bag at 12/10/20 0903  . acetaminophen (TYLENOL) tablet 650 mg  650 mg Oral Q6H PRN Gonfa, Taye T, MD      . amLODipine (NORVASC) tablet 5 mg  5 mg Oral Daily Gonfa, Taye T, MD      . chlorhexidine (PERIDEX) 0.12 % solution 15 mL  15 mL Mouth Rinse BID 02/07/21, MD   15 mL at 12/13/20 0929  . Chlorhexidine Gluconate Cloth 2 % PADS 6 each  6 each Topical Daily 02/10/21, MD   6 each at 12/14/20 1037  . influenza vaccine adjuvanted (FLUAD) injection 0.5 mL  0.5 mL Intramuscular Tomorrow-1000 02/11/21 V, MD      . insulin aspart (novoLOG) injection 0-24 Units  0-24 Units Subcutaneous TID WC Gonfa, Taye T, MD      . ipratropium-albuterol (DUONEB) 0.5-2.5 (3) MG/3ML  nebulizer solution 3 mL  3 mL Nebulization Q4H PRN Gonfa, Taye T, MD      . lactulose (CHRONULAC) 10 GM/15ML solution 20 g  20 g Oral BID Candelaria Stagers T, MD   20 g at 12/14/20 2116  . levothyroxine (SYNTHROID) tablet 112.5 mcg  112.5 mcg Oral Q0600 Candelaria Stagers T, MD   112.5 mcg at 12/15/20 0557  . MEDLINE mouth rinse  15 mL Mouth Rinse q12n4p Oretha Milch, MD   15 mL at 12/14/20 1656  . melatonin tablet 3 mg  3 mg Oral QHS Candelaria Stagers T, MD   3 mg at 12/14/20 2115  . pantoprazole (PROTONIX) EC tablet 40 mg  40 mg Oral BID Candelaria Stagers T, MD   40 mg at 12/14/20 2115  . rifaximin (XIFAXAN) tablet 550 mg  550 mg Oral BID Candelaria Stagers T,  MD   550 mg at 12/14/20 2115  . spironolactone (ALDACTONE) tablet 25 mg  25 mg Oral Daily Gonfa, Taye T, MD      . thiamine (B-1) injection 100 mg  100 mg Intravenous Daily Erick Blinks, MD         Discharge Medications: Please see discharge summary for a list of discharge medications.  Relevant Imaging Results:  Relevant Lab Results:   Additional Information SSN [id number]  patient has received covid vaccines- no booster shot  Eduard Roux, [address]

## 2020-12-15 NOTE — Progress Notes (Signed)
1140 PT is only oriented to place. PT has ate minimal of last nights dinner and is refusing to eat breakfast or lunch. PT did drink 118 ml of grape juice. PT declines taking medicine and refused to take pills whole and crushed/mixed in apple sauce.   Mikki Harbor, RN

## 2020-12-15 NOTE — Progress Notes (Signed)
Nutrition Follow-up  DOCUMENTATION CODES:   Not applicable  INTERVENTION:  Provide Ensure Enlive po TID, each supplement provides 350 kcal and 20 grams of protein.  Once G-tube is placed, and ready for use, Recommend Osmolite 1.2 cal formula at goal rate of 60 ml/hr with 45 ml Prosource TF once daily.  Tube feeding to provide 1768 kcal, 91 grams of protein, and 1166 ml free water.   NUTRITION DIAGNOSIS:   Inadequate oral intake related to acute illness as evidenced by NPO status; diet advanced; progressing  GOAL:   Patient will meet greater than or equal to 90% of their needs; progressing  MONITOR:   PO intake,Supplement acceptance,Diet advancement,Skin,Weight trends,Labs,I & O's  REASON FOR ASSESSMENT:   Consult,Ventilator Enteral/tube feeding initiation and management  ASSESSMENT:   80 yo female admitted with coffee ground/maroon hematemesis with UGIB,shock, acute encephalopathy. PMH NASH cirrhosis, HTN  1/20-Admitted, Intubated, EGD: 4 columns of large/grade 3 esophageal varicessix blands placed, clotted blood in gastric fundus-large clots removed 1/24-cortrak placed; tip gastric 01/27 - extubated 01/29 - palliative care consulted 01/31 - SLP consult, SLP recommends continue with current plan of care (NPO) 02/02 - PCCM reconsult for AMS and respiratory distress 02/03 - SLP consult, SLP recommends continue with current plan of care (NPO) 2/9 - Cortrak replaced, then pulled out by pt later in evening, diet advanced to dysphagia 1 with thin liquids.   Diet has been advanced to a dysphagia 2 diet with thin liquids. Pt only oriented to self with waxing and waning mental status. Calorie count initiated by MD. Pt refusing PO intake today and with minimal po yesterday at breakfast. Daughter agreeable to G-tube. Per MD, IR has been consulted for G tube placement. RD to discontinue calorie count as pt refusing PO with current plans for Gtube. RD to order Ensure to aid in PO  if pt accepting of supplement. Tube feeding recommendations stated above once Gtube ready for use.   Labs and medications reviewed.   Diet Order:   Diet Order            DIET DYS 2 Room service appropriate? Yes with Assist; Fluid consistency: Thin  Diet effective now                 EDUCATION NEEDS:   Not appropriate for education at this time  Skin:  Skin Assessment: Reviewed RN Assessment Skin Integrity Issues:: Incisions,Stage II Stage II: sacrum, elbow Incisions: MASD (perineum)  Last BM:  2/8  Height:   Ht Readings from Last 1 Encounters:  11/23/20 5\' 4"  (1.626 m)    Weight:   Wt Readings from Last 1 Encounters:  12/15/20 75.7 kg   BMI:  Body mass index is 28.65 kg/m.  Estimated Nutritional Needs:   Kcal:  1650-1850 kcals  Protein:  85-100 g  Fluid:  >/= 1.7 L  02/12/21, MS, RD, LDN RD pager number/after hours weekend pager number on Amion.

## 2020-12-15 NOTE — Progress Notes (Signed)
Physical Therapy Treatment Patient Details Name: Kaylee King MRN: [id number] DOB: 04-Jun-1941 Today's Date: 12/15/2020    History of Present Illness 80 year old female with history of hypothyroidism, medical noncompliance, NASH cirrhosis, presents to [facility] on 1/20 with progressive headaches, fatigue, confusion, and hypotension of unclear etiology. In the ED, had coffee ground/maroon colored hematemesis and was intubated for airway protection. CT head 1/20 was negative. Pt found to have upper GI bleed from esophageal varices and is followed by GI. EDG 1/20- Grade III esophageal varices, banded x6. Type I GE varices without bleeding. Nonbleeding gastric ulcers x3. ETT 1/20-1/27.    PT Comments    Pt sleeping upon arrival, but wakes upon PT arrival. Pt not oriented this day, but agreeable to OOB mobility. Pt overall requiring mod assist for bed mobility, pre-gait and gait activity, and pivot to and from [facility]. Pt demonstrating progressed mobility this session versus previous PT sessions. Pt tolerated LE exercises well to address LE weakness. Continuing to recommend SNF, will continue to follow.     Follow Up Recommendations  SNF;Supervision/Assistance - 24 hour     Equipment Recommendations  Other (comment) (tbd)    Recommendations for Other Services       Precautions / Restrictions Precautions Precautions: Fall Precaution Comments: flexiseal, L mitt Restrictions Weight Bearing Restrictions: No    Mobility  Bed Mobility Overal bed mobility: Needs Assistance Bed Mobility: Supine to Sit     Supine to sit: Mod assist     General bed mobility comments: mod assist for trunk elevation, LE lowering over side of bed. Pt able to scoot to EOB with increased time and effort, preference for leaning very anteriorly once EOB.    Transfers Overall transfer level: Needs assistance Equipment used: Rolling walker (2 wheeled);1 person hand held assist Transfers: Sit to/from [employer] Sit to Stand: Min assist Stand pivot transfers: Mod assist       General transfer comment: min assist for power up, rise, and steady; mod assist for steadying and guiding pt trunk to [facility]. VERY forward flexed trunk positioning and pt states "I can't stand up" when PT cues for upright posture.  Ambulation/Gait Ambulation/Gait assistance: Min assist Gait Distance (Feet): 5 Feet Assistive device: Rolling walker (2 wheeled) Gait Pattern/deviations: Step-through pattern;Decreased stride length;Drifts right/left;Trunk flexed Gait velocity: decr   General Gait Details: Mod assist to steady, guide pt and RW; cues for upright posture and placement in RW not followed during dynamic activity.   Stairs             Wheelchair Mobility    Modified Rankin (Stroke Patients Only)       Balance Overall balance assessment: Needs assistance Sitting-balance support: Feet supported;Single extremity supported Sitting balance-Leahy Scale: Fair Sitting balance - Comments: able to sit EOB without PT assist, weight shift. Pt with anterior leaning preference once sitting EOB stating "I am gonna fall"   Standing balance support: Bilateral upper extremity supported Standing balance-Leahy Scale: Poor Standing balance comment: reliant on external support                            Cognition Arousal/Alertness: Awake/alert Behavior During Therapy: WFL for tasks assessed/performed Overall Cognitive Status: Impaired/Different from baseline Area of Impairment: Orientation;Attention;Memory;Following commands;Safety/judgement;Awareness;Problem solving                 Orientation Level: Disoriented to;Place;Time;Situation;Person Current Attention Level: Sustained Memory: Decreased short-term memory Following Commands: Follows one step commands consistently;Follows multi-step  commands with increased time Safety/Judgement: Decreased awareness of safety;Decreased awareness of  deficits Awareness: Intellectual Problem Solving: Slow processing;Decreased initiation;Difficulty sequencing;Requires verbal cues;Requires tactile cues General Comments: follows commands consistently prior to mobility but cannot apply mobility cues during dynamic activity well. PT asks what pt's name is and she states "it's mine" and cannot otherwise state. Pt near-tearful at times during session, when asked what is wrong pt unable to state.      Exercises General Exercises - Lower Extremity Long Arc Quad: AAROM;Both;Seated;15 reps Hip ABduction/ADduction: AAROM;Both;15 reps;Seated Hip Flexion/Marching: AROM;Both;15 reps;Seated    General Comments General comments (skin integrity, edema, etc.): 3LO2 during mobility, VSS. Pressure injury to sacrum noted, reddened but not open. NT notified.      Pertinent Vitals/Pain Pain Assessment: Faces Faces Pain Scale: Hurts little more Pain Location: generalized Pain Descriptors / Indicators: Grimacing;Discomfort;Guarding Pain Intervention(s): Limited activity within patient's tolerance;Monitored during session;Repositioned    Home Living                      Prior Function            PT Goals (current goals can now be found in the care plan section) Acute Rehab PT Goals Patient Stated Goal: to go home PT Goal Formulation: With patient Time For Goal Achievement: 12/29/20 Potential to Achieve Goals: Fair Progress towards PT goals: Progressing toward goals    Frequency    Min 2X/week      PT Plan Current plan remains appropriate    Co-evaluation              AM-PAC PT "6 Clicks" Mobility   Outcome Measure  Help needed turning from your back to your side while in a flat bed without using bedrails?: A Lot Help needed moving from lying on your back to sitting on the side of a flat bed without using bedrails?: A Lot Help needed moving to and from a bed to a chair (including a wheelchair)?: A Lot Help needed standing  up from a chair using your arms (e.g., wheelchair or bedside chair)?: A Lot Help needed to walk in hospital room?: A Lot Help needed climbing 3-5 steps with a railing? : Total 6 Click Score: 11    End of Session Equipment Utilized During Treatment: Oxygen Activity Tolerance: Patient limited by fatigue Patient left: with call bell/phone within reach;with nursing/sitter in room;in chair (L hand mitt) Nurse Communication: Mobility status PT Visit Diagnosis: Other abnormalities of gait and mobility (R26.89);Difficulty in walking, not elsewhere classified (R26.2)     Time: 8185-6314 PT Time Calculation (min) (ACUTE ONLY): 26 min  Charges:  $Gait Training: 8-22 mins $Therapeutic Activity: 8-22 mins                     Marye Round, PT Acute Rehabilitation Services Pager [phone]  Office [phone]    [name] E Christain [place] 12/15/2020, 10:54 AM

## 2020-12-15 NOTE — TOC Initial Note (Addendum)
Transition of Care [facility]) - Initial/Assessment Note    Patient Details  Name: Kaylee King MRN: [id number] Date of Birth: 03-29-1941  Transition of Care [facility]) CM/SW Contact:    Eduard Roux, LCSWA Phone Number: 12/15/2020, 10:51 AM  Clinical Narrative:                  CSW spoke with patient's daughter,Pamela. CSW introduced self and explained role. CSW discussed PT recommendation of short term rehab at [facility]. Patient in the home with her daughter. Patient's daughter works during the day. She is agreeable to short term rehab at [facility]. CSW explained the SNF process. No preferred SNF.   CSW will provide bed offers once available CSW will continue to follow and assist with discharge planning   Antony Blackbird, MSW, LCSW Clinical Social Worker   Expected Discharge Plan: Skilled Nursing Facility Barriers to Discharge: Continued Medical Work up,Insurance Authorization,SNF Pending bed offer   Patient Goals and CMS Choice        Expected Discharge Plan and Services Expected Discharge Plan: Skilled Nursing Facility In-house Referral: Clinical Social Work     Living arrangements for the past 2 months: Skilled Nursing Facility                                      Prior Living Arrangements/Services Living arrangements for the past 2 months: Skilled Nursing Facility Lives with:: Self,Adult Children                   Activities of Daily Living      Permission Sought/Granted                  Emotional Assessment       Orientation: : Oriented to Self Alcohol / Substance Use: Not Applicable Psych Involvement: No (comment)  Admission diagnosis:  Encephalopathy [G93.40] History of ETT [Z92.89] H/O medication noncompliance [Z91.14] Symptomatic anemia [D64.9] Acute metabolic encephalopathy [G93.41] Hypothyroidism, unspecified type [E03.9] Patient Active Problem List   Diagnosis Date Noted  . Acute hypoxemic respiratory failure (HCC)   . Respiratory  failure (HCC)   . Pressure injury of skin 11/27/2020  . Acute metabolic encephalopathy 11/23/2020  . UGI bleed   . 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 Portal hypertension (HCC)   . Acute gastric ulcer without hemorrhage or perforation   . Hypothyroid 01/01/2012  . HTN (hypertension) 01/01/2012   PCP:  Fleet Contras, MD Pharmacy:   [facility] [address] ([place]), [place] - [address] [place] ([place]) [place] [address] Phone: [phone] Fax: [phone]     Social Determinants of Health (SDOH) Interventions    Readmission Risk Interventions No flowsheet data found.

## 2020-12-15 NOTE — Progress Notes (Signed)
PROGRESS NOTE  Kaylee King URK:[id number] DOB: [birth year]   PCP: Fleet Contras, MD  Patient is from: Home  DOA: 11/23/2020 LOS: 22  Chief complaints: Altered mental status and GI bleed  Brief Narrative / Interim history: 80 year old female with PMH of prior alcohol abuse, cirrhosis, hypothyroidism and noncompliance presenting with coffee-ground hematemesis, hemorrhagic shock and encephalopathy and intubated for airway protection.  She was transfused 6 units of PRBC and 2 units of FFP while in ICU.  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 She also completed 7 days of IV ceftriaxone for presumed pneumonia.  Eventually extubated and transferred to [facility] service for ongoing care.  Patient was lethargic and somewhat somnolent after extubation.  She had extensive work-up that was unrevealing except for hypothyroidism for which she has been started on Synthroid without significant improvement in her encephalopathy.    Patient went into respiratory failure with hypoxia and hypercapnia on 12/08/2020 but improved with brief BiPAP for about 24 hours.  There was concern about aspiration pneumonia which felt to be less likely. Unasyn discontinued.  Currently requiring 1 to 3 L.  SLP upgraded her to dysphagia 2 diet on 2/9 but poor PO intake due to waxing and waning mental status.  IR consulted for G-tube placement.  Subjective: Seen and examined earlier this morning.  No major events overnight of this morning.  Awake but not quite alert.  Somewhat confused.  Only oriented to self.  Follows commands.   Objective: Vitals:   12/14/20 2350 12/15/20 0408 12/15/20 0409 12/15/20 0741  BP: (!) 131/53 139/65  (!) 150/69  Pulse: 78 75  72  Resp: 20 14  19   Temp: 98.1 F (36.7 C) 97.7 F (36.5 C)  97.8 F (36.6 C)  TempSrc: Oral Oral  Oral  SpO2: 96% 97%  100%  Weight:   75.7 kg   Height:        Intake/Output Summary (Last 24 hours) at 12/15/2020 1401 Last data filed at 12/15/2020 0839 Gross  per 24 hour  Intake 218 ml  Output 1200 ml  Net -982 ml   Filed Weights   12/13/20 0500 12/14/20 0500 12/15/20 0409  Weight: 75.8 kg 75.9 kg 75.7 kg    Examination:  GENERAL: No apparent distress.  Nontoxic. HEENT: MMM.  Vision and hearing grossly intact.  NECK: Supple.  No apparent JVD.  RESP: 96% on 2.5 L.  No IWOB.  Fair aeration bilaterally. CVS:  RRR. Heart sounds normal.  ABD/GI/GU: BS+. Abd soft, NTND.  MSK/EXT:  Moves extremities. No apparent deformity. No edema.  SKIN: no apparent skin lesion or wound NEURO: Awake but not alert.  Only oriented to self.  Follows commands.  No apparent focal neuro deficit. PSYCH: Calm.  No distress or agitation.  Procedures:  Intubation and extubation  Microbiology summarized: COVID-19 PCR nonreactive.  Assessment & Plan: Acute metabolic encephalopathy-was concern about hepatic encephalopathy.  However, ammonia level within normal.  CT head and MRI brain without acute finding.  TSH 51.3 >>22.68>20.64.  EEG with mild diffuse encephalopathy but no seizure or epileptiform discharge.  Mental status seems to be waxing or waning. -Increased Synthroid from 100 mcg to 112.5 mcg daily on 2/8 -Increased lactulose from 20 g daily to twice daily on 2/9 -Completed Warn[place]ke dose thiamine-currently on maintenance dose. -Reorientation and delirium precautions. -Aspiration and fall precautions -Neurology signed off.  Acute hypoxic hypercapnic respiratory failure- currently on 1 to 3 L by Andrews. -Continue nebulizers -Aspiration precautions. -Minimum oxygen to keep saturation  above 88%.  Discussed with RN.  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 -Received about 6u or pRBC and 2u of FFP. -SCD for VTE prophylaxis -H. pylori IgG positive but H. pylori IgM negative-previously treated? -Continue PPI therapy   NSVT: Patient had 14 beats of NSVT ranging from 130 to 140 bpm on 2/9. -Continue telemetry -Optimize electrolytes.  Hypernatremia: Resolved. -Continue free water with tube feed.  Elevated liver enzymes: AST/ALT peaked at 134/88 but is downtrending. -Monitor intermittently  Hypothyroidism: TSH 51.3>> 22.62> 20.64 -Increase Synthroid from 100 mcg to 112.5 mcg on 2/8. -Repeat TSH in 2 to 3 weeks  Essential hypertension: Normotensive for most part -Continue monitoring.  UTI- treated with ceftriaxone  AKI: Resolved  Goal of care:  On 12/06/2020-discussed with patient's daughter over the phone.  Patient remains encephalopathic.  Overall prognosis grim.  Discussed about her CODE STATUS and the pros and cons of resuscitation and intubation.  Patient's daughter voiced understanding but wished to continue full code for now -Palliative care recommended outpatient follow-up  Feeding difficulties partly due to encephalopathy -Pulled out NG tube on 2/9. -SLP advanced her to dysphagia 2 but refusing to take PO now -IR consulted for G-tube placement after discussion with patient's daughter. Body mass index is 28.65 kg/m. Nutrition Problem: Inadequate oral intake Etiology: acute illness Signs/Symptoms: NPO status Interventions: Tube feeding,Prostat   Pressure injury: Stage II sacral and stage II left elbow Pressure Injury 11/24/20 Sacrum Medial Stage 2 -  Partial thickness loss of dermis presenting as a shallow open injury with a red, pink wound bed without slough. non-blanchable red spot on boney area on sacurm with open skin. (Active)  11/24/20 1900  Location: Sacrum  Location Orientation: Medial  Staging: Stage 2 -  Partial thickness loss of  dermis presenting as a shallow open injury with a red, pink wound bed without slough.  Wound Description (Comments): non-blanchable red spot on boney area on sacurm with open skin.  Present on Admission: No     Pressure Injury 11/29/20 Elbow Left;Posterior Stage 2 -  Partial thickness loss of dermis presenting as a shallow open injury with a red, pink wound bed without slough. (Active)  11/29/20 1000  Location: Elbow  Location Orientation: Left;Posterior  Staging: Stage 2 -  Partial thickness loss of dermis presenting as a shallow open injury with a red, pink wound bed without slough.  Wound Description (Comments):   Present on Admission:    DVT prophylaxis:  SCDs Start: 11/23/20 1654  Code Status: Full code Family Communication: Updated patient's daughter over the phone Level of care: Telemetry Medical Status is: Inpatient  Remains inpatient appropriate because:Altered mental status, Unsafe d/c plan and Inpatient level of care appropriate due to severity of illness   Dispo:  Patient From: Home  Planned Disposition: Skilled Nursing Facility  Expected discharge date: 12/18/2020  Medically stable for discharge: No         Consultants:  PCCM Palliative medicine Neurology Interventional radiology   Sch Meds:  Scheduled Meds: . amLODipine  5 mg Oral Daily  . chlorhexidine  15 mL Mouth Rinse BID  .  Chlorhexidine Gluconate Cloth  6 each Topical Daily  . influenza vaccine adjuvanted  0.5 mL Intramuscular Tomorrow-1000  . insulin aspart  0-24 Units Subcutaneous TID WC  . lactulose  20 g Oral BID  . levothyroxine  112.5 mcg Oral Q0600  . mouth rinse  15 mL Mouth Rinse q12n4p  . melatonin  3 mg Oral QHS  . pantoprazole  40 mg Oral BID  . rifaximin  550 mg Oral BID  . spironolactone  25 mg Oral Daily  . thiamine injection  100 mg Intravenous Daily   Continuous Infusions: . sodium chloride 10 mL/hr at 12/10/20 0903   PRN Meds:.sodium chloride, acetaminophen,  ipratropium-albuterol  Antimicrobials: Anti-infectives (From admission, onward)   Start     Dose/Rate Route Frequency Ordered Stop   12/14/20 2200  rifaximin (XIFAXAN) tablet 550 mg        550 mg Oral 2 times daily 12/14/20 1624     12/06/20 1630  Ampicillin-Sulbactam (UNASYN) 3 g in sodium chloride 0.9 % 100 mL IVPB  Status:  Discontinued        3 g 200 mL/hr over 30 Minutes Intravenous Every 6 hours 12/06/20 1557 12/07/20 1329   12/01/20 1000  rifaximin (XIFAXAN) tablet 550 mg  Status:  Discontinued        550 mg Per Tube 2 times daily 12/01/20 0903 12/14/20 1624   11/24/20 1200  cefTRIAXone (ROCEPHIN) 2 g in sodium chloride 0.9 % 100 mL IVPB        2 g 200 mL/hr over 30 Minutes Intravenous Every 24 hours 11/24/20 1047 11/30/20 1305   11/23/20 1730  cefTRIAXone (ROCEPHIN) 2 g in sodium chloride 0.9 % 100 mL IVPB  Status:  Discontinued        2 g 200 mL/hr over 30 Minutes Intravenous Every 24 hours 11/23/20 1721 11/24/20 1047       I have personally reviewed the following labs and images: CBC: Recent Labs  Lab 12/09/20 0244 12/11/20 0126 12/12/20 0409 12/14/20 0244 12/15/20 0417  WBC  --   --   --  6.1 6.8  HGB 8.9* 7.8* 8.3* 8.2* 8.3*  HCT 28.9* 26.8* 28.6* 28.5* 27.8*  MCV  --   --   --  99.7 98.6  PLT  --   --   --  477* 473*   BMP &GFR Recent Labs  Lab 12/09/20 0244 12/11/20 0126 12/14/20 0244 12/15/20 0417  NA 145 144 142 141  K 3.6 4.2 4.0 3.7  CL 106 108 108 108  CO2 25 28 24 24   GLUCOSE 96 132* 101* 101*  BUN 26* 21 18 14   CREATININE 0.88 0.80 0.83 0.87  CALCIUM 8.9 8.6* 8.7* 8.6*  MG 2.2 2.4 2.2 2.0  PHOS 2.9 3.2 3.3 3.4   Estimated Creatinine Clearance: 49.7 mL/min (by C-G formula based on SCr of 0.87 mg/dL). Liver & Pancreas: Recent Labs  Lab 12/09/20 0244 12/11/20 0126 12/12/20 0409 12/14/20 0244 12/15/20 0417  AST 98* 68* 65* 57* 46*  ALT 75* 54* 51* 45* 38  ALKPHOS 216* 171* 176* 187* 172*  BILITOT 1.5* 1.0 1.3* 1.2 1.4*  PROT 7.3 6.5  6.7 6.7 6.7  ALBUMIN 2.9* 2.6* 2.6* 2.7* 2.7*   No results for input(s): LIPASE, AMYLASE in the last 168 hours. Recent Labs  Lab 12/12/20 1726  AMMONIA 24   Diabetic: No results for input(s): HGBA1C in the last 72 hours. Recent Labs  Lab 12/14/20 1607 12/14/20 2014 12/15/20 0031 12/15/20 0739 12/15/20  1221  GLUCAP 101* 93 88 95 118*   Cardiac Enzymes: Recent Labs  Lab 12/12/20 0409  CKTOTAL 50   No results for input(s): PROBNP in the last 8760 hours. Coagulation Profile: No results for input(s): INR, PROTIME in the last 168 hours. Thyroid Function Tests: No results for input(s): TSH, T4TOTAL, FREET4, T3FREE, THYROIDAB in the last 72 hours. Lipid Profile: No results for input(s): CHOL, HDL, LDLCALC, TRIG, CHOLHDL, LDLDIRECT in the last 72 hours. Anemia Panel: No results for input(s): VITAMINB12, FOLATE, FERRITIN, TIBC, IRON, RETICCTPCT in the last 72 hours. Urine analysis:    Component Value Date/Time   COLORURINE YELLOW 11/23/2020 1853   APPEARANCEUR CLOUDY (A) 11/23/2020 1853   LABSPEC 1.015 11/23/2020 1853   PHURINE 5.0 11/23/2020 1853   GLUCOSEU NEGATIVE 11/23/2020 1853   HGBUR NEGATIVE 11/23/2020 1853   BILIRUBINUR NEGATIVE 11/23/2020 1853   KETONESUR NEGATIVE 11/23/2020 1853   PROTEINUR 30 (A) 11/23/2020 1853   NITRITE NEGATIVE 11/23/2020 1853   LEUKOCYTESUR MODERATE (A) 11/23/2020 1853   Sepsis Labs: Invalid input(s): PROCALCITONIN, LACTICIDVEN  Microbiology: No results found for this or any previous visit (from the past 240 hour(s)).  Radiology Studies: No results found.    Taye T. Gonfa [facility]  If 7PM-7AM, please contact night-coverage [email or web address] 12/15/2020, 2:01 PM

## 2020-12-16 ENCOUNTER — Inpatient Hospital Stay (HOSPITAL_COMMUNITY): Payer: Medicare Other

## 2020-12-16 DIAGNOSIS — G9341 Metabolic encephalopathy: Secondary | ICD-10-CM | POA: Diagnosis not present

## 2020-12-16 LAB — GLUCOSE, CAPILLARY
Glucose-Capillary: 102 mg/dL — ABNORMAL HIGH (ref 70–99)
Glucose-Capillary: 124 mg/dL — ABNORMAL HIGH (ref 70–99)
Glucose-Capillary: 111 mg/dL — ABNORMAL HIGH (ref 70–99)

## 2020-12-16 MED ORDER — CEFAZOLIN SODIUM-DEXTROSE 2-4 GM/100ML-% IV SOLN
2.0000 g | Freq: Once | INTRAVENOUS | Status: AC
  Administered 2020-12-18: 2 g via INTRAVENOUS
  Filled 2020-12-16: qty 100

## 2020-12-16 NOTE — Consult Note (Signed)
Chief Complaint: Dysphagia. Request is for gastrostomy tube placement  Referring Physician(s): Dr. Ludwig Lean  Supervising Physician: Malachy Moan  Patient Status: [facility] - In-pt  History of Present Illness: Kaylee King is a 80 y.o. female History of HTN,  COPD, cirrhosis. presented to the ED at [facility] with progressive headaches, fatigue, confusion and hypotension. Found to have encephalopathy be in hemorrhagic shock secondary to variceal bleed and hypoxia. Patient s/p 6 units of PRBC transfusions s/p banding on 1.20.20. Patient is now extubated but remains confused  with dysphagia. Team is requesting a gastrostomy tube placement for nutritional access.  Patient seen at beside, confused asking repetitive questions. Mittens present however the patient had her legs over the mayo stand and her head over the opposite side of the bed. RN made aware.   Discussed care of gastrostomy tube with the  Patient's daughter Kaylee King.. All questions were answered and concerns were addressed. Patient's daughter agree with plan.    Past Medical History:  Diagnosis Date  . Cirrhosis (HCC)   . Thyroid disease    hypothyroid     Past Surgical History:  Procedure Laterality Date  . ESOPHAGEAL BANDING  11/23/2020   Procedure: ESOPHAGEAL BANDING;  Surgeon: Beverley Fiedler, MD;  Location: [facility] ENDOSCOPY;  Service: Gastroenterology;;  . ESOPHAGOGASTRODUODENOSCOPY N/A 11/23/2020   Procedure: ESOPHAGOGASTRODUODENOSCOPY (EGD);  Surgeon: Beverley Fiedler, MD;  Location: [facility] ENDOSCOPY;  Service: Gastroenterology;  Laterality: N/A;  . RADIOLOGY WITH ANESTHESIA N/A 12/12/2020   Procedure: MRI WITH ANESTHESIA;  Surgeon: Radiologist, Medication, MD;  Location: [facility] OR;  Service: Radiology;  Laterality: N/A;    Allergies: Patient has no known allergies.  Medications: Prior to Admission medications   Medication Sig Start Date End Date Taking? Authorizing Provider  aspirin-acetaminophen-caffeine (EXCEDRIN MIGRAINE)  [phone] MG tablet Take 2 tablets by mouth every 12 (twelve) hours as needed (pain).   Yes [provider]  naproxen sodium (ALEVE) 220 MG tablet Take 440 mg by mouth every 12 (twelve) hours as needed (pain).   Yes [provider]     History reviewed. No pertinent family history.  Social History   Socioeconomic History  . Marital status: Legally Separated    Spouse name: Not on file  . Number of children: Not on file  . Years of education: Not on file  . Highest education level: Not on file  Occupational History  . Not on file  Tobacco Use  . Smoking status: Former Smoker    Quit date: 12/31/1981    Years since quitting: 38.9  . Smokeless tobacco: Never Used  Substance and Sexual Activity  . Alcohol use: Never  . Drug use: Never  . Sexual activity: Not on file  Other Topics Concern  . Not on file  Social History Narrative  . Not on file   Social Determinants of Health   Financial Resource Strain: Not on file  Food Insecurity: Not on file  Transportation Needs: Not on file  Physical Activity: Not on file  Stress: Not on file  Social Connections: Not on file     Review of Systems: A 12 point ROS discussed and pertinent positives are indicated in the HPI above.  All other systems are negative.  Review of Systems  Constitutional: Negative for fatigue and fever.  HENT: Negative for congestion.   Respiratory: Negative for cough and shortness of breath.   Gastrointestinal: Negative for abdominal pain, diarrhea, nausea and vomiting.  Psychiatric/Behavioral: Positive for confusion.    Vital Signs:  BP 113/87   Pulse 72   Temp 98.4 F (36.9 C) (Oral)   Resp 19   Ht 5\' 4"  (1.626 m)   Wt 160 lb 4.4 oz (72.7 kg)   SpO2 95%   BMI 27.51 kg/m   Physical Exam Vitals and nursing note reviewed.  Constitutional:      Appearance: She is well-developed.  HENT:     Head: Normocephalic and atraumatic.  Eyes:     Conjunctiva/sclera: Conjunctivae normal.   Cardiovascular:     Rate and Rhythm: Normal rate and regular rhythm.     Heart sounds: Normal heart sounds.  Pulmonary:     Effort: Pulmonary effort is normal.     Breath sounds: Normal breath sounds.  Musculoskeletal:        General: Normal range of motion.     Cervical back: Normal range of motion.  Skin:    General: Skin is warm.  Neurological:     Mental Status: She is alert.     Comments: Oriented to person. Asking repetitive questions.      Imaging: CT ABDOMEN WO CONTRAST  Result Date: 12/16/2020 CLINICAL DATA:  Assess anatomy for possible percutaneous gastrostomy tube placement EXAM: CT ABDOMEN WITHOUT CONTRAST TECHNIQUE: Multidetector CT imaging of the abdomen was performed following the standard protocol without IV contrast. COMPARISON:  None. FINDINGS: Lower chest: Multifocal patchy airspace opacities in the dependent aspect of the left lower lobe, and to a lesser extent the right lower lobe. Trace bilateral pleural effusions. Enlarged main pulmonary artery measuring up to 4 cm in diameter raising the possibility of underlying pulmonary arterial hypertension. Atherosclerotic calcifications visualized throughout the coronary arteries. The heart itself is normal in size. Trace pericardial fluid versus thickening. Ectatic but nonaneurysmal descending thoracic aorta with scattered atherosclerotic plaque. Hepatobiliary: Limited evaluation in the absence of intravenous contrast. Grossly normal morphology and contour. Gallbladder is unremarkable. No intra or extrahepatic biliary ductal dilatation. Pancreas: Unremarkable. No pancreatic ductal dilatation or surrounding inflammatory changes. Spleen: Normal in size without focal abnormality. Adrenals/Urinary Tract: Small nephrolithiasis bilaterally. No evidence of hydronephrosis. No exophytic renal mass. Limited evaluation in the absence of intravenous contrast. Stomach/Bowel: Normal anatomic position of the stomach without colonic  interposition. No focal bowel wall thickening or evidence of obstruction. Vascular/Lymphatic: Limited evaluation in the absence of intravenous contrast. Atherosclerotic calcifications throughout the abdominal aorta. Mild aneurysmal dilation of the juxtarenal abdominal aorta measuring up to 3.2 cm. No suspicious lymphadenopathy. Other: Expansion of the right psoas muscle relative to the left with central low attenuation. Nonspecific stranding versus small volume fluid along the right pararenal fascia, likely representing a small amount of hemorrhage. Musculoskeletal: Chronic appearing compression fractures at T10 and T12. Mild levoconvex scoliosis of the lumbar spine with associated multilevel degenerative disc disease. IMPRESSION: 1. Anatomy is suitable for percutaneous gastrostomy tube placement. 2. Relative expansion and central low attenuation of the right psoas muscle relative to the left. Differential considerations include intro psoas hematoma versus abscess. 3. 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 Recommend follow-up every 3 years. This recommendation follows ACR consensus guidelines: White Paper of the ACR Incidental Findings Committee II on Vascular Findings. J Am Coll Radiol 2013; 10:789-794. Aortic Atherosclerosis (ICD10-I70.0); Aortic aneurysm NOS (ICD10-I71.9). 4. Multifocal patchy airspace opacities in the left greater than right lower lobe concerning for pneumonia versus aspiration. 5. Enlarged main pulmonary artery suggests pulmonary arterial hypertension. 6. Small nonobstructing nephrolithiasis bilaterally. 7. Chronic appearing T12 and T10 compression fractures. Electronically Signed  By: Malachy Moan M.D.   On: 12/16/2020 11:17   DG Chest 1 View  Result Date: 11/27/2020 CLINICAL DATA:  Pneumonia.  Patient is intubated. EXAM: CHEST  1 VIEW COMPARISON:  One-view chest x-ray 11/25/2020 FINDINGS: Endotracheal tube is stable and in satisfactory position. Left  IJ catheter is also stable and in satisfactory position. Heart is enlarged. Atherosclerotic calcifications are again noted at the arch. Bilateral pleural effusions and bibasilar airspace disease are similar the prior study. IMPRESSION: 1. Stable chest x-ray with bilateral pleural effusions and bibasilar airspace disease. 2. Stable support apparatus. Electronically Signed   By: Marin Roberts M.D.   On: 11/27/2020 08:56   CT Head Wo Contrast  Result Date: 11/23/2020 CLINICAL DATA:  Headache, altered mental status. EXAM: CT HEAD WITHOUT CONTRAST TECHNIQUE: Contiguous axial images were obtained from the base of the skull through the vertex without intravenous contrast. COMPARISON:  None. FINDINGS: Brain: The brainstem, cerebellum, cerebral peduncles, thalami, basal ganglia, basilar cisterns, and ventricular system appear within normal limits. Periventricular white matter and corona radiata hypodensities favor chronic ischemic microvascular white matter disease. No intracranial hemorrhage, mass lesion, or acute CVA. Vascular: There is atherosclerotic calcification of the cavernous carotid arteries bilaterally. Skull: Unremarkable Sinuses/Orbits: Unremarkable Other: No supplemental non-categorized findings. IMPRESSION: 1. No acute intracranial findings. 2. Periventricular white matter and corona radiata hypodensities favor chronic ischemic microvascular white matter disease. Electronically Signed   By: Gaylyn Rong M.D.   On: 11/23/2020 13:55   MR BRAIN WO CONTRAST  Result Date: 12/12/2020 CLINICAL DATA:  Mental status change, unknown cause. Additional history provided: Patient presenting with coffee ground emesis, hemorrhagic shock, encephalopathy. Presumed pneumonia. EXAM: MRI HEAD WITHOUT CONTRAST TECHNIQUE: Multiplanar, multiecho pulse sequences of the brain and surrounding structures were obtained without intravenous contrast. COMPARISON:  Head CT 11/23/2020. FINDINGS: Brain: Multiple sequences  are significantly motion degraded, limiting evaluation. Most notably, there is moderate motion degradation of the axial T2 weighted sequence, moderate motion degradation of the axial T2/FLAIR sequence, moderate motion degradation of the axial SWI sequence, moderately severe motion degradation of the sagittal T1 weighted sequence and moderate/severe motion degradation of the coronal T2 weighted sequence. Moderate generalized cerebral atrophy with comparatively mild cerebellar atrophy. Chronic left basal ganglia lacunar infarcts. Moderately severe multifocal T2/FLAIR hyperintensity within the cerebral white matter is nonspecific, but compatible with chronic small vessel ischemic disease. The diffusion-weighted imaging is of good quality. No evidence of acute infarction. Small foci of SWI signal loss within the right corona radiata and left thalamus likely reflect chronic microhemorrhages. An additional curvilinear focus of SWI signal loss within the left cerebellar hemisphere may reflect a chronic microhemorrhage or small developmental venous anomaly (an anatomic variant). No evidence of intracranial mass. No extra-axial fluid collection. No midline shift. Vascular: Expected proximal arterial flow voids. Skull and upper cervical spine: Within the limitations of motion degradation, no focal marrow lesion is identified. Sinuses/Orbits: Visualized orbits show no acute finding. Mild ethmoid sinus mucosal thickening. Other: Bilateral mastoid effusions. IMPRESSION: Motion degraded examination, as described and limiting evaluation. The diffusion-weighted imaging is of good quality. No evidence of acute infarct or other acute intracranial abnormality. Chronic left basal ganglia lacunar infarcts. Moderately severe cerebral white matter chronic small vessel ischemic disease. Moderate cerebral atrophy with mild cerebellar atrophy. Mild ethmoid sinus mucosal thickening. Bilateral mastoid effusions. Electronically Signed   By:  Jackey Loge DO   On: 12/12/2020 09:01   DG Chest Port 1 View  Result Date: 12/10/2020 CLINICAL DATA:  Hypoxemia. EXAM:  PORTABLE CHEST 1 VIEW COMPARISON:  One-view chest x-ray 12/06/2020 FINDINGS: Small bore feeding tube extends off the inferior border the film. The heart is enlarged. Aeration of both lungs is improved. Mild interstitial pattern remains. Bibasilar airspace opacities are present. Small effusions remain, right greater than left. IMPRESSION: 1. Markedly improved aeration of both lungs. 2. Persistent mild interstitial pattern and bilateral pleural effusions, right greater than left. Electronically Signed   By: Marin Roberts M.D.   On: 12/10/2020 13:04   DG Chest Port 1 View  Result Date: 12/06/2020 CLINICAL DATA:  Hypoxemia. EXAM: PORTABLE CHEST 1 VIEW COMPARISON:  November 29, 2020. FINDINGS: Stable cardiomegaly. No pneumothorax is noted. Feeding tube is seen entering stomach. Bibasilar opacities are noted concerning for atelectasis or infiltrates with associated pleural effusions. Bony thorax is unremarkable. IMPRESSION: Bibasilar opacities are noted concerning for atelectasis or infiltrates with associated pleural effusions. Electronically Signed   By: Lupita Raider M.D.   On: 12/06/2020 12:28   DG Chest Port 1 View  Result Date: 11/29/2020 CLINICAL DATA:  Abnormal respirations. EXAM: PORTABLE CHEST 1 VIEW COMPARISON:  11/27/2020. FINDINGS: Interim placement of feeding tube, its tip is below the left hemidiaphragm. Endotracheal tube, left IJ line stable position. Cardiomegaly. Bilateral mild interstitial prominence. Interstitial edema and/or pneumonitis can not be excluded. Bibasilar atelectasis. Small bilateral pleural effusions cannot be excluded. IMPRESSION: 1. Interim placement of feeding tube, its tip is below left hemidiaphragm. Endotracheal tube and left IJ line stable position. 2.  Cardiomegaly. 3. Mild bilateral interstitial prominence. Interstitial edema and/or pneumonitis  cannot be excluded. Electronically Signed   By: Maisie Fus  Register   On: 11/29/2020 05:38   DG CHEST PORT 1 VIEW  Result Date: 11/25/2020 CLINICAL DATA:  Respiratory failure, altered mental status EXAM: PORTABLE CHEST 1 VIEW COMPARISON:  11/24/2020 FINDINGS: Endotracheal tube terminates 4.5 cm above the carina. Left IJ venous catheter terminates in the distal left brachiocephalic vein, unchanged. Mild patchy left lower lobe opacity, suspicious for pneumonia. Suspected small bilateral pleural effusions. No pneumothorax. The heart is normal in size.  Thoracic aortic atherosclerosis. IMPRESSION: Endotracheal tube terminates 4.5 cm above the carina. Mild patchy left lower lobe opacity, suspicious for pneumonia. Suspected small bilateral pleural effusions. Electronically Signed   By: Charline Bills M.D.   On: 11/25/2020 11:53   DG Chest Port 1 View  Result Date: 11/24/2020 CLINICAL DATA:  Hypoxia EXAM: PORTABLE CHEST 1 VIEW COMPARISON:  November 23, 2020 FINDINGS: Endotracheal tube tip is 1.4 cm above the carina. Central catheter tip in region of left innominate vein, stable. No pneumothorax. There is ill-defined opacity in the left base, similar to 1 day prior. Areas of atelectatic change noted in each perihilar region. Heart is mildly enlarged with pulmonary vascular normal. No adenopathy. There is aortic atherosclerosis. No bone lesions. IMPRESSION: Tube and catheter positions as described without pneumothorax. Ill-defined opacity left base concerning for focal pneumonia with atelectasis. There is perihilar atelectatic change as well. Stable cardiac silhouette. Aortic Atherosclerosis (ICD10-I70.0). Electronically Signed   By: Bretta Bang III M.D.   On: 11/24/2020 07:59   DG Chest Portable 1 View  Result Date: 11/23/2020 CLINICAL DATA:  Post central line placement EXAM: PORTABLE CHEST 1 VIEW COMPARISON:  11/23/2020 FINDINGS: Interval intubation, tip of the endotracheal tube is about 3.8 cm superior to  the carina. Left-sided central venous catheter with tip projecting over the SVC origin. Low lung volumes. Airspace disease at the left base. Enlarged cardiomediastinal silhouette with aortic atherosclerosis. No pneumothorax. IMPRESSION: 1.  Interval intubation with tip of endotracheal tube about 3.8 cm superior to the carina. Left-sided central venous catheter tip overlies the SVC origin. No pneumothorax 2. Low lung volumes with left basilar airspace disease which may reflect atelectasis, pneumonia or aspiration Electronically Signed   By: Jasmine Pang M.D.   On: 11/23/2020 19:04   DG Chest Portable 1 View  Result Date: 11/23/2020 CLINICAL DATA:  Endotracheal tube placement. EXAM: PORTABLE CHEST 1 VIEW COMPARISON:  None. FINDINGS: An endotracheal tube is seen with its distal tip approximately 4.1 cm from the carina. Mild atelectasis and/or infiltrate is seen within the left lung base. This is increased in severity when compared to the prior study. There is no evidence of a pleural effusion or pneumothorax. The heart size and mediastinal contours are within normal limits. The visualized skeletal structures are unremarkable. IMPRESSION: 1. Endotracheal tube in good position. 2. Mild left basilar atelectasis and/or infiltrate, increased in severity when compared to the prior study. Electronically Signed   By: Aram Candela M.D.   On: 11/23/2020 18:05   DG Chest Port 1 View  Result Date: 11/23/2020 CLINICAL DATA:  Weakness declined appetite increased confusion x2 days EXAM: PORTABLE CHEST 1 VIEW COMPARISON:  None. FINDINGS: The heart size and mediastinal contours are mildly enlarged, accentuated by technique. Aortic atherosclerosis. Streaky left lower lobe opacity. No pleural effusion or pneumothorax. Thoracic spondylosis and bilateral AC joints DJD. IMPRESSION: Streaky left lower lobe opacity, likely atelectasis but pneumonia not excluded. Electronically Signed   By: Maudry Mayhew MD   On: 11/23/2020 13:07    DG Abd Portable 1V  Result Date: 12/10/2020 CLINICAL DATA:  Determine of the tip of the feeding tube is metallic prior to MRI. EXAM: PORTABLE ABDOMEN - 1 VIEW COMPARISON:  None. FINDINGS: Feeding tube with the tip projecting over the antrum of the stomach. Tip of the feeding tube appears to be metallic. No bowel dilatation to suggest obstruction. No evidence of pneumoperitoneum, portal venous gas or pneumatosis. No pathologic calcifications along the expected course of the ureters. Bibasilar airspace disease which may reflect atelectasis versus pneumonia. No acute osseous abnormality. IMPRESSION: Feeding tube with the tip projecting over the antrum of the stomach. Tip of the feeding tube appears to be metallic. Recommend correlation with manufacturer guidelines as to whether the feeding tube is MRI compatible or not. Electronically Signed   By: Elige Ko   On: 12/10/2020 12:08   EEG adult  Result Date: 12/11/2020 Charlsie Quest, MD     12/11/2020  2:42 PM Patient Name: Kaylee King MRN: [id number] Epilepsy Attending: Charlsie Quest Referring Physician/Provider: Dr Candelaria Stagers Date: 12/11/2020 Duration: 24.37 mins Patient history: 80yo F with ams. EEG to evaluate for seizure Level of alertness: Awake AEDs during EEG study: None Technical aspects: This EEG study was done with scalp electrodes positioned according to the 10-20 International system of electrode placement. Electrical activity was acquired at a sampling rate of 500Hz  and reviewed with a high frequency filter of 70Hz  and a low frequency filter of 1Hz . EEG data were recorded continuously and digitally stored. Description: The posterior dominant rhythm consists of 8 Hz activity of moderate voltage (25-35 uV) seen predominantly in posterior head regions, symmetric and reactive to eye opening and eye closing. EEG showed continuous generalized 5 to 6 Hz theta slowing.  Hyperventilation and photic stimulation were not performed.   ABNORMALITY  -Continuous slow, generalized IMPRESSION: This study is suggestive of mild diffuse encephalopathy, nonspecific etiology. No seizures or epileptiform  discharges were seen throughout the recording. Priyanka O Yadav    Labs:  CBC: Recent Labs    12/07/20 0421 12/07/20 9562 12/08/20 0222 12/09/20 0244 12/11/20 0126 12/12/20 0409 12/14/20 0244 12/15/20 0417  WBC 10.0  --  6.9  --   --   --  6.1 6.8  HGB 7.4*   < > 8.4*   < > 7.8* 8.3* 8.2* 8.3*  HCT 24.1*   < > 28.3*   < > 26.8* 28.6* 28.5* 27.8*  PLT 221  --  282  --   --   --  477* 473*   < > = values in this interval not displayed.    COAGS: Recent Labs    11/24/20 0018 12/08/20 0222  INR 1.3* 1.2  APTT  --  24    BMP: Recent Labs    12/09/20 0244 12/11/20 0126 12/14/20 0244 12/15/20 0417  NA 145 144 142 141  K 3.6 4.2 4.0 3.7  CL 106 108 108 108  CO2 GLUCOSE 96 132* 101* 101*  BUN 26* CALCIUM 8.9 8.6* 8.7* 8.6*  CREATININE 0.88 0.80 0.83 0.87  GFRNONAA >60 >60 >60 >60    LIVER FUNCTION TESTS: Recent Labs    12/11/20 0126 12/12/20 0409 12/14/20 0244 12/15/20 0417  BILITOT 1.0 1.3* 1.2 1.4*  AST 68* 65* 57* 46*  ALT 54* 51* 45* 38  ALKPHOS 171* 176* 187* 172*  PROT 6.5 6.7 6.7 6.7  ALBUMIN 2.6* 2.6* 2.7* 2.7*    Assessment and Plan:  80 y.o. female inpatient. History of HTN,  COPD, cirrhosis. presented to the ED at [facility] with progressive headaches, fatigue, confusion and hypotension. Found to have encephalopathy be in hemorrhagic shock secondary to variceal bleed and hypoxia. Patient s/p 6 units of PRBC transfusions s/p banding on 1.20.20. Patient is now extubated but remains confused  with dysphagia. Team is requesting a gastrostomy tube placement for nutritional access.   CT abdomen shows the stomach in accessible position. Hgb 8.3. Alkaline Phosphatase 172, AST 46. NKDA.  IR consulted for possible gastrostomy tube placement. Case has been reviewed and procedure approved by Dr.  Archer Asa.  Patient tentatively scheduled for 2.14.22.  Team instructed to: Keep Patient to be NPO after midnight  IR will call patient when ready.  Risks and benefits image guided gastrostomy tube placement was discussed with the patient's daughter including, but not limited to the need for a barium enema during the procedure, bleeding, infection, peritonitis and/or damage to adjacent structures.  All of the patient's daughter's questions were answered, patient's daughter is agreeable to proceed.  Consent signed and in chart.     Thank you for this interesting consult.  I greatly enjoyed meeting Kaylee King and look forward to participating in their care.  A copy of this report was sent to the requesting provider on this date.  Electronically Signed: Alene Mires, NP 12/16/2020, 1:59 PM   I spent a total of 40 Minutes    in face to face in clinical consultation, greater than 50% of which was counseling/coordinating care for gastrostomy tube placement

## 2020-12-16 NOTE — Progress Notes (Signed)
Pt refused to wear BIPAP tonight . She is resting well on 2L [place] no distress noted .

## 2020-12-16 NOTE — Progress Notes (Signed)
PROGRESS NOTE    Kaylee King  WUJ:[id number]  DOB: 13-Jul-1941  DOA: 11/23/2020 PCP: Fleet Contras, MD Outpatient Specialists:   Hospital course:  80 year old female with cirrhosis and alcohol use was admitted 11/23/2020 with hemorrhagic shock secondary to variceal bleed and encephalopathy.  She is required 6 units PRBC and 2 units FFP.  Patient had been intubated for airway protection however was also diagnosed with pneumonia and treated with 7 days of IV ceftriaxone.  Patient remained lethargic after extubation.  Work-up has been negative.  Patient had acute on chronic respiratory failure with hypoxia and hypercapnia on 12/08/2020 but improved with BiPAP x24 hours.  Subjective:  She is lying in bed in a fetal position eyes closed intermittently moaning.  She will open her eyes and look at me and speak in short sentences but then will close her eyes and start moaning again.  Patient specifically denies any pain or discomfort.   Objective: Vitals:   12/16/20 0500 12/16/20 0719 12/16/20 0800 12/16/20 1200  BP:  104/68 115/70 113/87  Pulse:  62 67 72  Resp:  20 20 19   Temp:  98.5 F (36.9 C)  98.4 F (36.9 C)  TempSrc:  Oral  Oral  SpO2:  98% 94% 95%  Weight: 72.7 kg     Height:        Intake/Output Summary (Last 24 hours) at 12/16/2020 1307 Last data filed at 12/16/2020 1100 Gross per 24 hour  Intake 1211.54 ml  Output --  Net 1211.54 ml   Filed Weights   12/14/20 0500 12/15/20 0409 12/16/20 0500  Weight: 75.9 kg 75.7 kg 72.7 kg     Exam:  General: Patient looks unwell and acutely ill on top of chronic illness. Eyes: sclera anicteric, conjuctiva mild injection bilaterally CVS: S1-S2, regular  Respiratory:  decreased air entry bilaterally secondary to decreased inspiratory effort, rales at bases  GI: NABS, soft, NT  LE: Decreased muscle tone bilaterally Neuro: Patient is somnolent but arousable by voice alone.  Noncooperative to neuro exam. Psych: Unable to assess  due to altered mental status.  Assessment & Plan:   80 year old with process was admitted with hemorrhagic shock secondary to variceal bleed has persistent confusion of unclear etiology after extubation.  Acute metabolic encephalopathy Work-up is been negative with normal ammonia level, negative head CT and MRI brain. EEG shows mild diffuse encephalopathy without seizure activity. 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 Continue PPI  Acute on chronic hypoxic hypercapnic respiratory failure secondary to COPD Now resolved Continue  inhaled bronchodilators Keep O2 sats greater than 88%  Cirrhosis of the liver Continue spironolactone, lactulose and rifaximin  Hypothyroidism Synthroid has been increased to 112.5 MCG on December 12, 2020 TSH is down trended from 51 down to 20. Repeat TSH in 1 month.  Abnormal LFTs Transaminases are trending down Alkaline phosphatase remains moderately elevated and stable  HTN Good control on Norvasc and spironolactone Can consider decreasing Norvasc as warranted  NSVT 18 beat run on December 13, 2020 Continue telemetry  Copied from Dr. December 15, 2020 note: Goal of  care:  On 12/06/2020-discussed with patient's daughter over the phone.  Patient remains encephalopathic.  Overall prognosis grim.  Discussed about her CODE STATUS and the pros and cons of resuscitation and intubation.  Patient's daughter voiced understanding but wished to continue full code for now -Palliative care recommended outpatient  follow-up  Feeding difficulties partly due to encephalopathy -Pulled out NG tube on 2/9. -SLP advanced her to dysphagia 2 but refusing to take PO now -IR consulted for G-tube placement after discussion with patient's daughter. Body mass index is 28.65 kg/m. Nutrition Problem: Inadequate oral intake Etiology: acute illness Signs/Symptoms: NPO status Interventions: Tube feeding,Prostat     DVT prophylaxis: SCD Code Status: Full, see note above Family Communication: None today Disposition Plan:   Patient is from: Home  Anticipated Discharge Location: SNF  Barriers to Discharge: Acutely encephalopathic  Is patient medically stable for Discharge: No   Consultants: PCCM  Procedures:  Intubation and extubation  EGD and banding of varices  Antimicrobials:  None at present, status post ceftriaxone x7 days   Data Reviewed:  Basic Metabolic Panel: Recent Labs  Lab 12/11/20 0126 12/14/20 0244 12/15/20 0417  NA 144 142 141  K 4.2 4.0 3.7  CL 108 108 108  CO2 28 24 24   GLUCOSE 132* 101* 101*  BUN 21 18 14   CREATININE 0.80 0.83 0.87  CALCIUM 8.6* 8.7* 8.6*  MG 2.4 2.2 2.0  PHOS 3.2 3.3 3.4   Liver Function Tests: Recent Labs  Lab 12/11/20 0126 12/12/20 0409 12/14/20 0244 12/15/20 0417  AST 68* 65* 57* 46*  ALT 54* 51* 45* 38  ALKPHOS 171* 176* 187* 172*  BILITOT 1.0 1.3* 1.2 1.4*  PROT 6.5 6.7 6.7 6.7  ALBUMIN 2.6* 2.6* 2.7* 2.7*   No results for input(s): LIPASE, AMYLASE in the last 168 hours. Recent Labs  Lab 12/12/20 1726  AMMONIA 24   CBC: Recent Labs  Lab 12/11/20 0126 12/12/20 0409 12/14/20 0244 12/15/20 0417  WBC  --   --  6.1 6.8  HGB 7.8* 8.3* 8.2* 8.3*  HCT 26.8* 28.6* 28.5* 27.8*  MCV  --   --  99.7 98.6  PLT  --   --  477* 473*   Cardiac Enzymes: Recent Labs  Lab 12/12/20 0409  CKTOTAL 50   BNP (last 3 results) No results for input(s): PROBNP in the last 8760 hours. CBG: Recent Labs  Lab 12/15/20 1221 12/15/20 1607  12/15/20 2209 12/16/20 0757 12/16/20 1208  GLUCAP 118* 92 119* 102* 124*    No results found for this or any previous visit (from the past 240 hour(s)).    Studies: CT ABDOMEN WO CONTRAST  Result Date: 12/16/2020 CLINICAL DATA:  Assess anatomy for possible percutaneous gastrostomy tube placement EXAM: CT ABDOMEN WITHOUT CONTRAST TECHNIQUE: Multidetector CT imaging of the abdomen was performed following the standard protocol without IV contrast. COMPARISON:  None. FINDINGS: Lower chest: Multifocal patchy airspace opacities in the dependent aspect of the left lower lobe, and to a lesser extent the right lower lobe. Trace bilateral pleural effusions. Enlarged main pulmonary artery measuring up to 4 cm in diameter raising the possibility of underlying pulmonary arterial hypertension. Atherosclerotic calcifications visualized throughout the coronary arteries. The heart itself is normal in size. Trace pericardial fluid versus thickening. Ectatic but nonaneurysmal descending thoracic aorta with scattered atherosclerotic plaque. Hepatobiliary: Limited evaluation in the absence of intravenous contrast. Grossly normal morphology and contour. Gallbladder is unremarkable. No intra or extrahepatic biliary ductal dilatation. Pancreas: Unremarkable. No pancreatic ductal  dilatation or surrounding inflammatory changes. Spleen: Normal in size without focal abnormality. Adrenals/Urinary Tract: Small nephrolithiasis bilaterally. No evidence of hydronephrosis. No exophytic renal mass. Limited evaluation in the absence of intravenous contrast. Stomach/Bowel: Normal anatomic position of the stomach without colonic interposition. No focal bowel wall thickening or evidence of obstruction. Vascular/Lymphatic: Limited evaluation in the absence of intravenous contrast. Atherosclerotic calcifications throughout the abdominal aorta. Mild aneurysmal dilation of the juxtarenal abdominal aorta measuring up to 3.2 cm. No suspicious  lymphadenopathy. Other: Expansion of the right psoas muscle relative to the left with central low attenuation. Nonspecific stranding versus small volume fluid along the right pararenal fascia, likely representing a small amount of hemorrhage. Musculoskeletal: Chronic appearing compression fractures at T10 and T12. Mild levoconvex scoliosis of the lumbar spine with associated multilevel degenerative disc disease. IMPRESSION: 1. Anatomy is suitable for percutaneous gastrostomy tube placement. 2. Relative expansion and central low attenuation of the right psoas muscle relative to the left. Differential considerations include intro psoas hematoma versus abscess. 3. 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 Recommend follow-up every 3 years. This recommendation follows ACR consensus guidelines: White Paper of the ACR Incidental Findings Committee II on Vascular Findings. J Am Coll Radiol 2013; 10:789-794. Aortic Atherosclerosis (ICD10-I70.0); Aortic aneurysm NOS (ICD10-I71.9). 4. Multifocal patchy airspace opacities in the left greater than right lower lobe concerning for pneumonia versus aspiration. 5. Enlarged main pulmonary artery suggests pulmonary arterial hypertension. 6. Small nonobstructing nephrolithiasis bilaterally. 7. Chronic appearing T12 and T10 compression fractures. Electronically Signed   By: Malachy Moan M.D.   On: 12/16/2020 11:17     Scheduled Meds: . amLODipine  5 mg Oral Daily  . chlorhexidine  15 mL Mouth Rinse BID  . Chlorhexidine Gluconate Cloth  6 each Topical Daily  . feeding supplement  237 mL Oral TID BM  . influenza vaccine adjuvanted  0.5 mL Intramuscular Tomorrow-1000  . insulin aspart  0-24 Units Subcutaneous TID WC  . lactulose  20 g Oral BID  . levothyroxine  112.5 mcg Oral Q0600  . mouth rinse  15 mL Mouth Rinse q12n4p  . melatonin  3 mg Oral QHS  . pantoprazole  40 mg Oral BID  . rifaximin  550 mg Oral BID  . spironolactone  25 mg  Oral Daily  . thiamine injection  100 mg Intravenous Daily   Continuous Infusions: . sodium chloride 10 mL/hr at 12/10/20 0903  . dextrose 5 % and 0.45 % NaCl with KCl 10 mEq/L 65 mL/hr (12/16/20 0759)    Active Problems:   Acute metabolic encephalopathy   UGI bleed   Pressure injury of skin   Respiratory failure (HCC)   Acute hypoxemic respiratory failure (HCC)     Taneesha Edgin Orma Flaming, [facility]  If 7PM-7AM, please contact night-coverage [email or web address] Password [email or web address] 12/16/2020, 1:07 PM    LOS: 23 days

## 2020-12-16 NOTE — Progress Notes (Signed)
Per IR Attending Dr. Vella Redhead, CT Abdomen ordered by IR shows the following incidental findings:   1. Retroperitoneal hematoma vs. abscess in the RIGHT psoas muscle. 2. 3.2 cm AAA, rec f/u in 3 years 3. Bilateral PNA vs aspiration  These findigs were communicated via secure chat to Dr. Arthur Holms on 2.12.22 @ 11:22

## 2020-12-16 NOTE — Progress Notes (Signed)
This RN went into patient's room and found the IV pump beeping that it was occluded on the patient side, patient was bending arm and after examination of IV site in left [facility] the site was infiltrated.  IV infusion was stopped, peripheral IV taken out.  New IV site in right forearm, 20 gauge IV, infusing IV fluids.  Will continue to monitor infiltration in left arm and monitor new IV site

## 2020-12-17 DIAGNOSIS — G9341 Metabolic encephalopathy: Secondary | ICD-10-CM | POA: Diagnosis not present

## 2020-12-17 DIAGNOSIS — K922 Gastrointestinal hemorrhage, unspecified: Secondary | ICD-10-CM | POA: Diagnosis not present

## 2020-12-17 LAB — CBC
HCT: 24.7 % — ABNORMAL LOW (ref 36.0–46.0)
Hemoglobin: 7.6 g/dL — ABNORMAL LOW (ref 12.0–15.0)
MCH: 29.6 pg (ref 26.0–34.0)
MCHC: 30.8 g/dL (ref 30.0–36.0)
MCV: 96.1 fL (ref 80.0–100.0)
Platelets: 453 10*3/uL — ABNORMAL HIGH (ref 150–400)
RBC: 2.57 MIL/uL — ABNORMAL LOW (ref 3.87–5.11)
RDW: 18.1 % — ABNORMAL HIGH (ref 11.5–15.5)
WBC: 6.4 10*3/uL (ref 4.0–10.5)
nRBC: 0.3 % — ABNORMAL HIGH (ref 0.0–0.2)

## 2020-12-17 LAB — COMPREHENSIVE METABOLIC PANEL
ALT: 34 U/L (ref 0–44)
AST: 43 U/L — ABNORMAL HIGH (ref 15–41)
Albumin: 2.5 g/dL — ABNORMAL LOW (ref 3.5–5.0)
Alkaline Phosphatase: 169 U/L — ABNORMAL HIGH (ref 38–126)
Anion gap: 11 (ref 5–15)
BUN: 13 mg/dL (ref 8–23)
CO2: 21 mmol/L — ABNORMAL LOW (ref 22–32)
Calcium: 8.4 mg/dL — ABNORMAL LOW (ref 8.9–10.3)
Chloride: 109 mmol/L (ref 98–111)
Creatinine, Ser: 0.92 mg/dL (ref 0.44–1.00)
GFR, Estimated: >60 mL/min (ref >60)
Glucose, Bld: 121 mg/dL — ABNORMAL HIGH (ref 70–99)
Potassium: 3.8 mmol/L (ref 3.5–5.1)
Sodium: 141 mmol/L (ref 135–145)
Total Bilirubin: 1.3 mg/dL — ABNORMAL HIGH (ref 0.3–1.2)
Total Protein: 6.1 g/dL — ABNORMAL LOW (ref 6.5–8.1)

## 2020-12-17 LAB — GLUCOSE, CAPILLARY
Glucose-Capillary: 117 mg/dL — ABNORMAL HIGH (ref 70–99)
Glucose-Capillary: 130 mg/dL — ABNORMAL HIGH (ref 70–99)
Glucose-Capillary: 96 mg/dL (ref 70–99)
Glucose-Capillary: 96 mg/dL (ref 70–99)

## 2020-12-17 LAB — HEMOGLOBIN AND HEMATOCRIT, BLOOD
HCT: 26.3 % — ABNORMAL LOW (ref 36.0–46.0)
Hemoglobin: 8.2 g/dL — ABNORMAL LOW (ref 12.0–15.0)

## 2020-12-17 NOTE — Progress Notes (Signed)
PROGRESS NOTE    Kaylee King  YQM:[id number]  DOB: [birth year]  DOA: 11/23/2020 PCP: Fleet Contras, MD Outpatient Specialists:   Hospital course:  80 year old female with cirrhosis and alcohol use was admitted 11/23/2020 with hemorrhagic shock secondary to variceal bleed and encephalopathy.  She is required 6 units PRBC and 2 units FFP.  Patient had been intubated for airway protection however was also diagnosed with pneumonia and treated with 7 days of IV ceftriaxone.  Patient remained lethargic after extubation.  Work-up has been negative.  Patient had acute on chronic respiratory failure with hypoxia and hypercapnia on 12/08/2020 but improved with BiPAP x24 hours.  Subjective:  Patient is awake and alert today.  When I asked her how she feels she states "wonderful!"  She states that she would like to get out of bed "I would like to get up and go".  She does note that she is in the hospital at [facility].  Is not really sure how long she has been here or why she was here.  She notes she is feeling weak but only feels slightly confused today.   Objective: Vitals:   12/17/20 0500 12/17/20 0800 12/17/20 1106 12/17/20 1532  BP:  123/68 104/73 (!) 136/91  Pulse:  77 73 72  Resp:  20 18 20   Temp:  98.3 F (36.8 C) 98.6 F (37 C) 98.8 F (37.1 C)  TempSrc:  Oral Oral Oral  SpO2:  96% 94% 92%  Weight: 58.1 kg     Height:        Intake/Output Summary (Last 24 hours) at 12/17/2020 1604 Last data filed at 12/17/2020 1500 Gross per 24 hour  Intake 1912.01 ml  Output 225 ml  Net 1687.01 ml   Filed Weights   12/15/20 0409 12/16/20 0500 12/17/20 0500  Weight: 75.7 kg 72.7 kg 58.1 kg     Exam:  General: Marked improvement in patient since yesterday.  She sitting up in bed speaking coherently and asking questions which makes sense.   Eyes: sclera anicteric, conjuctiva mild injection bilaterally CVS: S1-S2, regular  Respiratory:  decreased air entry bilaterally secondary to decreased  inspiratory effort, rales at bases  GI: NABS, soft, NT  LE: Decreased muscle tone bilaterally Neuro: Patient is somnolent but arousable by voice alone.  Noncooperative to neuro exam. Psych: Unable to assess due to altered mental status.  Assessment & Plan:   80 year old with process was admitted with hemorrhagic shock secondary to variceal bleed has persistent confusion of unclear etiology after extubation.  Acute metabolic encephalopathy Marked improvement in encephalopathy overnight. Yesterday she was lying in a fetal position moaning, this morning she states she would like to go home and that she feels wonderful. Continue treatment with PT and OT Work-up is been negative with normal ammonia level, negative head CT and MRI brain. EEG shows mild diffuse encephalopathy without seizure activity. Of note patient does have significant hypothyroidism which is being treated Continue Synthroid 112.5 Continue lactulose 20 g twice daily and rifaximin Patient has completed Warn[place]ke dose thiamine, currently on maintenance dose Delirium precautions  S/P hemorrhagic shock secondary to variceal bleed with acute blood loss anemia Patient has had decrease in hemoglobin from 8.3-7.6 overnight. Blood pressure has remained stable in fact improved this morning. Repeat hemoglobin tonight and tomorrow morning No clinical evidence of recurrent bleed. 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 Continue PPI  Acute on chronic hypoxic hypercapnic respiratory failure secondary to COPD Now resolved Continue  inhaled bronchodilators Keep O2 sats greater than 88%  Cirrhosis of the liver Continue spironolactone, lactulose and rifaximin  Hypothyroidism Synthroid has been increased to 112.5 MCG on December 12, 2020 TSH is down trended from 51 down to 20. Repeat TSH in 1 month.  Abnormal LFTs Transaminases are  trending down Alkaline phosphatase remains moderately elevated and stable  HTN Good control on Norvasc and spironolactone Can consider decreasing Norvasc as warranted  NSVT 18 beat run on December 13, 2020 Continue telemetry  Copied from Dr. Sundra Aland note: Goal of care:  On 12/06/2020-discussed with patient's daughter over the phone.  Patient remains encephalopathic.  Overall prognosis grim.  Discussed about her CODE STATUS and the pros and cons of resuscitation and intubation.  Patient's daughter voiced understanding but wished to continue full code for now -Palliative care recommended outpatient follow-up  Feeding difficulties partly due to encephalopathy -Pulled out NG tube on 2/9. -SLP advanced her to dysphagia 2 but refusing to take PO now -IR consulted for G-tube placement after discussion with patient's daughter. Body mass index is 28.65 kg/m. Nutrition Problem: Inadequate oral intake Etiology: acute illness Signs/Symptoms: NPO status Interventions: Tube feeding,Prostat     DVT prophylaxis: SCD Code Status: Full, see note above Family Communication: None today Disposition Plan:   Patient is from: Home  Anticipated Discharge Location: SNF  Barriers to Discharge: Acutely encephalopathic  Is patient medically stable for Discharge: No   Consultants: PCCM  Procedures:  Intubation and extubation  EGD and banding of varices  Antimicrobials:  None at present, status post ceftriaxone x7 days   Data Reviewed:  Basic Metabolic Panel: Recent Labs  Lab 12/11/20 0126 12/14/20 0244 12/15/20 0417 12/17/20 0442  NA 144 142 141 141  K 4.2 4.0 3.7 3.8  CL 108 108 108 109  CO2 28 24 24  21*  GLUCOSE 132* 101* 101* 121*  BUN 21 18 14 13   CREATININE 0.80 0.83 0.87 0.92  CALCIUM 8.6* 8.7* 8.6* 8.4*  MG 2.4 2.2 2.0  --   PHOS 3.2 3.3 3.4  --    Liver Function Tests: Recent Labs  Lab 12/11/20 0126 12/12/20 0409 12/14/20 0244 12/15/20 0417 12/17/20 0442  AST  68* 65* 57* 46* 43*  ALT 54* 51* 45* 38 34  ALKPHOS 171* 176* 187* 172* 169*  BILITOT 1.0 1.3* 1.2 1.4* 1.3*  PROT 6.5 6.7 6.7 6.7 6.1*  ALBUMIN 2.6* 2.6* 2.7* 2.7* 2.5*   No results for input(s): LIPASE, AMYLASE in the last 168 hours. Recent Labs  Lab 12/12/20 1726  AMMONIA 24   CBC: Recent Labs  Lab 12/11/20 0126 12/12/20 0409 12/14/20 0244 12/15/20 0417 12/17/20 0442  WBC  --   --  6.1 6.8 6.4  HGB 7.8* 8.3* 8.2* 8.3* 7.6*  HCT 26.8* 28.6* 28.5* 27.8* 24.7*  MCV  --   --  99.7 98.6 96.1  PLT  --   --  477* 473* 453*   Cardiac Enzymes: Recent Labs  Lab 12/12/20 0409  CKTOTAL 50   BNP (last 3 results) No results for input(s): PROBNP in the last 8760 hours. CBG: Recent Labs  Lab 12/16/20 0757 12/16/20 1208 12/16/20 1605 12/17/20 0900 12/17/20 1111  GLUCAP 102* 124* 111* 117* 130*    No results found for this or any previous visit (from the past 240 hour(s)).    Studies: CT ABDOMEN WO CONTRAST  Result Date: 12/16/2020 CLINICAL DATA:  Assess  anatomy for possible percutaneous gastrostomy tube placement EXAM: CT ABDOMEN WITHOUT CONTRAST TECHNIQUE: Multidetector CT imaging of the abdomen was performed following the standard protocol without IV contrast. COMPARISON:  None. FINDINGS: Lower chest: Multifocal patchy airspace opacities in the dependent aspect of the left lower lobe, and to a lesser extent the right lower lobe. Trace bilateral pleural effusions. Enlarged main pulmonary artery measuring up to 4 cm in diameter raising the possibility of underlying pulmonary arterial hypertension. Atherosclerotic calcifications visualized throughout the coronary arteries. The heart itself is normal in size. Trace pericardial fluid versus thickening. Ectatic but nonaneurysmal descending thoracic aorta with scattered atherosclerotic plaque. Hepatobiliary: Limited evaluation in the absence of intravenous contrast. Grossly normal morphology and contour. Gallbladder is unremarkable.  No intra or extrahepatic biliary ductal dilatation. Pancreas: Unremarkable. No pancreatic ductal dilatation or surrounding inflammatory changes. Spleen: Normal in size without focal abnormality. Adrenals/Urinary Tract: Small nephrolithiasis bilaterally. No evidence of hydronephrosis. No exophytic renal mass. Limited evaluation in the absence of intravenous contrast. Stomach/Bowel: Normal anatomic position of the stomach without colonic interposition. No focal bowel wall thickening or evidence of obstruction. Vascular/Lymphatic: Limited evaluation in the absence of intravenous contrast. Atherosclerotic calcifications throughout the abdominal aorta. Mild aneurysmal dilation of the juxtarenal abdominal aorta measuring up to 3.2 cm. No suspicious lymphadenopathy. Other: Expansion of the right psoas muscle relative to the left with central low attenuation. Nonspecific stranding versus small volume fluid along the right pararenal fascia, likely representing a small amount of hemorrhage. Musculoskeletal: Chronic appearing compression fractures at T10 and T12. Mild levoconvex scoliosis of the lumbar spine with associated multilevel degenerative disc disease. IMPRESSION: 1. Anatomy is suitable for percutaneous gastrostomy tube placement. 2. Relative expansion and central low attenuation of the right psoas muscle relative to the left. Differential considerations include intro psoas hematoma versus abscess. 3. 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 Recommend follow-up every 3 years. This recommendation follows ACR consensus guidelines: White Paper of the ACR Incidental Findings Committee II on Vascular Findings. J Am Coll Radiol 2013; 10:789-794. Aortic Atherosclerosis (ICD10-I70.0); Aortic aneurysm NOS (ICD10-I71.9). 4. Multifocal patchy airspace opacities in the left greater than right lower lobe concerning for pneumonia versus aspiration. 5. Enlarged main pulmonary artery suggests pulmonary  arterial hypertension. 6. Small nonobstructing nephrolithiasis bilaterally. 7. Chronic appearing T12 and T10 compression fractures. Electronically Signed   By: Malachy MoanHeath  McCullough M.D.   On: 12/16/2020 11:17     Scheduled Meds: . amLODipine  5 mg Oral Daily  . chlorhexidine  15 mL Mouth Rinse BID  . Chlorhexidine Gluconate Cloth  6 each Topical Daily  . feeding supplement  237 mL Oral TID BM  . influenza vaccine adjuvanted  0.5 mL Intramuscular Tomorrow-1000  . insulin aspart  0-24 Units Subcutaneous TID WC  . lactulose  20 g Oral BID  . levothyroxine  112.5 mcg Oral Q0600  . mouth rinse  15 mL Mouth Rinse q12n4p  . melatonin  3 mg Oral QHS  . pantoprazole  40 mg Oral BID  . rifaximin  550 mg Oral BID  . spironolactone  25 mg Oral Daily  . thiamine injection  100 mg Intravenous Daily   Continuous Infusions: . sodium chloride 10 mL/hr at 12/10/20 0903  . [START ON 12/18/2020]  ceFAZolin (ANCEF) IV    . dextrose 5 % and 0.45 % NaCl with KCl 10 mEq/L 65 mL/hr at 12/17/20 0007    Active Problems:   Acute metabolic encephalopathy   UGI bleed   Pressure  injury of skin   Respiratory failure (HCC)   Acute hypoxemic respiratory failure (HCC)     Ryot Burrous Orma Flaming, [facility]  If 7PM-7AM, please contact night-coverage [email or web address] Password [email or web address] 12/17/2020, 4:04 PM    LOS: 24 days

## 2020-12-18 ENCOUNTER — Inpatient Hospital Stay (HOSPITAL_COMMUNITY): Payer: Medicare Other

## 2020-12-18 DIAGNOSIS — J9601 Acute respiratory failure with hypoxia: Secondary | ICD-10-CM | POA: Diagnosis not present

## 2020-12-18 HISTORY — PX: IR GASTROSTOMY TUBE MOD SED: IMG625

## 2020-12-18 LAB — CBC
HCT: 27.2 % — ABNORMAL LOW (ref 36.0–46.0)
Hemoglobin: 7.8 g/dL — ABNORMAL LOW (ref 12.0–15.0)
MCH: 28.7 pg (ref 26.0–34.0)
MCHC: 28.7 g/dL — ABNORMAL LOW (ref 30.0–36.0)
MCV: 100 fL (ref 80.0–100.0)
Platelets: 431 10*3/uL — ABNORMAL HIGH (ref 150–400)
RBC: 2.72 MIL/uL — ABNORMAL LOW (ref 3.87–5.11)
RDW: 18.1 % — ABNORMAL HIGH (ref 11.5–15.5)
WBC: 5.3 10*3/uL (ref 4.0–10.5)
nRBC: 0 % (ref 0.0–0.2)

## 2020-12-18 LAB — GLUCOSE, CAPILLARY
Glucose-Capillary: 90 mg/dL (ref 70–99)
Glucose-Capillary: 94 mg/dL (ref 70–99)
Glucose-Capillary: 95 mg/dL (ref 70–99)
Glucose-Capillary: 95 mg/dL (ref 70–99)
Glucose-Capillary: 99 mg/dL (ref 70–99)

## 2020-12-18 LAB — COMPREHENSIVE METABOLIC PANEL
ALT: 29 U/L (ref 0–44)
AST: 34 U/L (ref 15–41)
Albumin: 2.5 g/dL — ABNORMAL LOW (ref 3.5–5.0)
Alkaline Phosphatase: 155 U/L — ABNORMAL HIGH (ref 38–126)
Anion gap: 10 (ref 5–15)
BUN: 9 mg/dL (ref 8–23)
CO2: 21 mmol/L — ABNORMAL LOW (ref 22–32)
Calcium: 8.5 mg/dL — ABNORMAL LOW (ref 8.9–10.3)
Chloride: 108 mmol/L (ref 98–111)
Creatinine, Ser: 0.87 mg/dL (ref 0.44–1.00)
GFR, Estimated: >60 mL/min (ref >60)
Glucose, Bld: 95 mg/dL (ref 70–99)
Potassium: 3.7 mmol/L (ref 3.5–5.1)
Sodium: 139 mmol/L (ref 135–145)
Total Bilirubin: 1.4 mg/dL — ABNORMAL HIGH (ref 0.3–1.2)
Total Protein: 6.3 g/dL — ABNORMAL LOW (ref 6.5–8.1)

## 2020-12-18 LAB — VITAMIN B1: Vitamin B1 (Thiamine): 228.8 nmol/L — ABNORMAL HIGH (ref 66.5–200.0)

## 2020-12-18 MED ORDER — FENTANYL CITRATE (PF) 100 MCG/2ML IJ SOLN
INTRAMUSCULAR | Status: DC | PRN
  Administered 2020-12-18: 50 ug via INTRAVENOUS

## 2020-12-18 MED ORDER — IOHEXOL 300 MG/ML  SOLN
50.0000 mL | Freq: Once | INTRAMUSCULAR | Status: AC | PRN
  Administered 2020-12-18: 10 mL

## 2020-12-18 MED ORDER — LIDOCAINE HCL 1 % IJ SOLN
INTRAMUSCULAR | Status: AC
  Filled 2020-12-18: qty 20

## 2020-12-18 MED ORDER — CEFAZOLIN SODIUM-DEXTROSE 2-4 GM/100ML-% IV SOLN
INTRAVENOUS | Status: AC
  Filled 2020-12-18: qty 100

## 2020-12-18 MED ORDER — LIDOCAINE HCL 1 % IJ SOLN
INTRAMUSCULAR | Status: DC | PRN
  Administered 2020-12-18: 10 mL

## 2020-12-18 MED ORDER — FENTANYL CITRATE (PF) 100 MCG/2ML IJ SOLN
INTRAMUSCULAR | Status: AC
  Filled 2020-12-18: qty 4

## 2020-12-18 MED ORDER — MIDAZOLAM HCL 2 MG/2ML IJ SOLN
INTRAMUSCULAR | Status: DC | PRN
  Administered 2020-12-18: 1 mg via INTRAVENOUS

## 2020-12-18 MED ORDER — MIDAZOLAM HCL 2 MG/2ML IJ SOLN
INTRAMUSCULAR | Status: AC
  Filled 2020-12-18: qty 4

## 2020-12-18 MED ORDER — GLUCAGON HCL RDNA (DIAGNOSTIC) 1 MG IJ SOLR
INTRAMUSCULAR | Status: AC
  Filled 2020-12-18: qty 1

## 2020-12-18 MED ORDER — BACITRACIN-NEOMYCIN-POLYMYXIN 400-5-5000 EX OINT
1.0000 "application " | TOPICAL_OINTMENT | Freq: Every day | CUTANEOUS | Status: DC
  Administered 2020-12-19: 1 via TOPICAL
  Filled 2020-12-18 (×2): qty 1

## 2020-12-18 NOTE — Progress Notes (Signed)
  Speech Language Pathology Treatment: Dysphagia  Patient Details Name: Kaylee King MRN: [id number] DOB: September 22, 1941 Today's Date: 12/18/2020 Time: 1210-1220 SLP Time Calculation (min) (ACUTE ONLY): 10 min  Assessment / Plan / Recommendation Clinical Impression  ST following pt for safety with diet texture recommendations and ability to upgrade. She upgraded from puree to Dys 2 (fine chopped) last week and today seen for upgrade if appropriate. Therapist is recommending she stay on the Dys 2. Pt needed lots of encouragement to take any solid/Dys 3 texture. Per documentation she is more alert than prior. Today her demeanor was awake, conversive but moaning, mildly restless and confused. She was able to [facility] and transit the graham cracker timely and without residue however suspect she would pocket a higher texture. Don't think she is influenced too much by the texture of the food currently. Recommend continue Dys 2, thin, and full supervision for safe intake and next venue of care (SNF) can upgrade when able.    HPI HPI: Pt is an 80 year old female with history of hypothyroidism noncompliant who presented with progressive headaches, fatigue, confusion, and hypotension of unclear etiology. In the ED, had coffee ground/maroon colored hematemesis and was intubated for airway protection. CXR: Interim placement of feeding tube, its tip is below left hemidiaphragm. CT head 1/20 was negative. Pt found to have upper GI bleed from esophageal varices and is followed by GI. EDG 1/20- Grade III esophageal varices, banded x6. Type I GE varices without bleeding. Nonbleeding gastric ulcers x3. ETT 1/20-1/27. Cortrak placed on 1/24. CXR 2/6: Markedly improved aeration of both lungs.  2. Persistent mild interstitial pattern and bilateral pleural  effusions, right greater than left. MRI 2/8: No evidence of  acute infarct or other acute intracranial abnormality.      SLP Plan  Continue with current plan of care        Recommendations  Diet recommendations: Dysphagia 2 (fine chop);Thin liquid Liquids provided via: Cup;Straw Medication Administration: Whole meds with puree Supervision: Staff to assist with self feeding;Full supervision/cueing for compensatory strategies Compensations: Slow rate;Small sips/bites Postural Changes and/or Swallow Maneuvers: Seated upright 90 degrees                Oral Care Recommendations: Oral care BID Follow up Recommendations: Skilled Nursing facility SLP Visit Diagnosis: Dysphagia, unspecified (R13.10) Plan: Continue with current plan of care       GO                Royce Macadamia 12/18/2020, 2:00 PM

## 2020-12-18 NOTE — Progress Notes (Signed)
Updated pt's daughter on G-tube procedure today.

## 2020-12-18 NOTE — Progress Notes (Signed)
[facility] ([facility]) Community Based Palliative Care       This patient has been referred to our palliative care services in the community.  [facility] will continue to follow for any discharge planning needs and to coordinate admission onto palliative care.    Thank you for the opportunity to participate in this patient's care.     Chrislyn King, BSN, RN [facility] Hospital Liaison   [phone] (24h on call) 

## 2020-12-18 NOTE — Progress Notes (Signed)
Patient is more alert and awake at this time, able to verbalized who she is and where she is ("at some hospital"). She is able to follow command and denies any pain or discomfort at this time. Will continue to monitor.

## 2020-12-18 NOTE — Procedures (Signed)
Interventional Radiology Procedure Note  Procedure: Placement of percutaneous 20F pull-through gastrostomy tube. Complications: None Recommendations: - NPO except for sips and chips remainder of today and overnight - Maintain G-tube to LWS until tomorrow morning  - May advance diet as tolerated and begin using tube tomorrow morning  Signed,   Perl Kerney S. Yaritzy Huser, DO   

## 2020-12-18 NOTE — Progress Notes (Signed)
Patient resting comfortably on room air. BIPAP not needed at this time. RT will monitor as needed. 

## 2020-12-18 NOTE — Progress Notes (Signed)
Physical Therapy Treatment Patient Details Name: Kaylee King MRN: [id number] DOB: [birth year] Today's Date: 12/18/2020    History of Present Illness 80 year old female with history of hypothyroidism, medical noncompliance, NASH cirrhosis, presents to [facility] on 1/20 with progressive headaches, fatigue, confusion, and hypotension of unclear etiology. In the ED, had coffee ground/maroon colored hematemesis and was intubated for airway protection. CT head 1/20 was negative. Pt found to have upper GI bleed from esophageal varices and is followed by GI. EDG 1/20- Grade III esophageal varices, banded x6. Type I GE varices without bleeding. Nonbleeding gastric ulcers x3. ETT 1/20-1/27. s/p PEG tube 2/14.    PT Comments    Pt moaning and restless upon arrival to room, unable to state what is wrong when asked other than "cold" during mobility. Pt tolerated bed-level session today with rolling and repositioning only, secondary to pt discomfort and fatigue. Pt soiled in brief upon arrival, and required PT assist for pericare as well. Pt overall requiring mod-max assist today. SNF remains appropriate d/c venue, will continue to follow acutely.    Follow Up Recommendations  SNF;Supervision/Assistance - 24 hour     Equipment Recommendations  Other (comment) (tbd)    Recommendations for Other Services       Precautions / Restrictions Precautions Precautions: Fall Precaution Comments: flexiseal    Mobility  Bed Mobility Overal bed mobility: Needs Assistance   Rolling: Mod assist         General bed mobility comments: mod assist for rolling bilaterally for trunk and LE management, pt initially resistant to rolling but agreeable once moving. PT encouraged roll for removing soiled brief and pads. Boost up in bed with use of bed pads and boost function on bed.    Transfers                 General transfer comment: pt declines today  Ambulation/Gait                 Stairs              Wheelchair Mobility    Modified Rankin (Stroke Patients Only)       Balance Overall balance assessment: Needs assistance Sitting-balance support: Feet supported;Single extremity supported Sitting balance-Leahy Scale: Fair Sitting balance - Comments: NT today   Standing balance support: Bilateral upper extremity supported Standing balance-Leahy Scale: Poor Standing balance comment: NT today                            Cognition Arousal/Alertness: Awake/alert Behavior During Therapy: WFL for tasks assessed/performed Overall Cognitive Status: Impaired/Different from baseline Area of Impairment: Orientation;Attention;Following commands;Safety/judgement;Awareness;Problem solving                 Orientation Level: Disoriented to;Time;Situation Current Attention Level: Sustained   Following Commands: Follows one step commands with increased time Safety/Judgement: Decreased awareness of safety;Decreased awareness of deficits Awareness: Intellectual Problem Solving: Slow processing;Decreased initiation;Difficulty sequencing;Requires verbal cues;Requires tactile cues General Comments: Pt moaning throughout session, unableto state what is wrong. PT asks if pt had a procedure today (had a peg tube placed this am), and pt states "no yesterday, it involved the pavement".      Exercises      General Comments General comments (skin integrity, edema, etc.): sacral pad placed, PT performing pericare as pt soiled in brief, NT notified she needs purewick and should take a break from brief for pt skin integrity.      Pertinent Vitals/Pain  Pain Assessment: Faces Faces Pain Scale: Hurts little more Pain Location: generalized Pain Descriptors / Indicators: Grimacing;Discomfort;Guarding Pain Intervention(s): Limited activity within patient's tolerance;Monitored during session;Repositioned    Home Living                      Prior Function            PT  Goals (current goals can now be found in the care plan section) Acute Rehab PT Goals Patient Stated Goal: to go home PT Goal Formulation: With patient Time For Goal Achievement: 12/29/20 Potential to Achieve Goals: Fair Progress towards PT goals: Progressing toward goals    Frequency    Min 2X/week      PT Plan Current plan remains appropriate    Co-evaluation              AM-PAC PT "6 Clicks" Mobility   Outcome Measure  Help needed turning from your back to your side while in a flat bed without using bedrails?: A Lot Help needed moving from lying on your back to sitting on the side of a flat bed without using bedrails?: A Lot Help needed moving to and from a bed to a chair (including a wheelchair)?: A Lot Help needed standing up from a chair using your arms (e.g., wheelchair or bedside chair)?: A Lot Help needed to walk in hospital room?: A Lot Help needed climbing 3-5 steps with a railing? : Total 6 Click Score: 11    End of Session Equipment Utilized During Treatment: Oxygen Activity Tolerance: Patient limited by fatigue Patient left: with call bell/phone within reach;in bed;with bed alarm set;with nursing/sitter in room (L hand mitt) Nurse Communication: Mobility status PT Visit Diagnosis: Other abnormalities of gait and mobility (R26.89);Difficulty in walking, not elsewhere classified (R26.2)     Time: 4627-0350 PT Time Calculation (min) (ACUTE ONLY): 19 min  Charges:  $Therapeutic Activity: 8-22 mins                     Marye Round, PT Acute Rehabilitation Services Pager [phone]  Office [phone]   Truddie Coco 12/18/2020, 5:17 PM

## 2020-12-18 NOTE — Progress Notes (Signed)
[facility] PROGRESS NOTE    Kaylee King  ZOX:[id number] DOB: 09/19/1941 DOA: 11/23/2020 PCP: Fleet ContrasAvbuere, Edwin, MD      Brief Narrative:  Mrs. Kaylee King is a 80 y.o. F with cirrhosis and alcohol use who was admitted with hemorrhagic shock due to variceal bleeding.  Received 6 units PRBCs and 2 units FFP. Intubated for airway protection, diagnosed with pneumonia and treated with 7 days ceftriaxone.  Developed acute on chronic respiratory failure with hypoxia and hypercapnia 2/4, but improved after BiPAP for 24 hours.  Encephalopathy now improving gradually. Has been referred to hospice.     Assessment & Plan:  Hemorrhagic shock due to variceal bleeding Acute blood loss anemia Hemoglobin stable, 8.2 from 7.6 yesterday, status post 6 units PRBCs and 2 units FFP. EGD on 1/20 showed grade 3 varices that were banded. 3 nonbleeding gastric ulcers. -Continue PPI  Acute on chronic hypoxic respiratory failure with hypercapnia due to COPD Now resolved -Continue bronchodilators -Continue oxygen  Acute metabolic encephalopathy Improving. Was dosed with Warnicke dose of thiamine, now back to maintenance dose. -Continue thiamine -Continue lactulose  Cirrhosis -Continue spironolactone, lactulose, rifaximin  Hypothyroidism -Continue levothyroxine  Hypertension -Continue amlodipine  Pressure Injury 11/24/20 Sacrum Medial Stage 2 -  Partial thickness loss of dermis presenting as a shallow open injury with a red, pink wound bed without slough. non-blanchable red spot on boney area on sacurm with open skin. (Active)  11/24/20 1900  Location: Sacrum  Location Orientation: Medial  Staging: Stage 2 -  Partial thickness loss of dermis presenting as a shallow open injury with a red, pink wound bed without slough.  Wound Description (Comments): non-blanchable red spot on boney area on sacurm with open skin.  Present on Admission: No     Pressure Injury 11/29/20 Elbow  Left;Posterior Stage 2 -  Partial thickness loss of dermis presenting as a shallow open injury with a red, pink wound bed without slough. (Active)  11/29/20 1000  Location: Elbow  Location Orientation: Left;Posterior  Staging: Stage 2 -  Partial thickness loss of dermis presenting as a shallow open injury with a red, pink wound bed without slough.  Wound Description (Comments):   Present on Admission:           Disposition: Status is: Inpatient  Remains inpatient appropriate because:Unsafe d/c plan   Dispo:  Patient From:    Planned Disposition: Skilled Nursing Facility  Expected discharge date: 12/18/2020  Medically stable for discharge:           Level of care: Telemetry Medical       MDM: The below labs and imaging reports were reviewed and summarized above.  Medication management as above.   DVT prophylaxis: SCDs Start: 11/23/20 1654  Code Status: Full code Family Communication:        Subjective: No vomiting, no fever, no chest pain, no dyspnea.  Objective: Vitals:   12/18/20 1009 12/18/20 1030 12/18/20 1100 12/18/20 1126  BP: (!) 135/57 131/63 (!) 125/59   Pulse: 63 63 64 71  Resp:   20 18  Temp:   98.5 F (36.9 C)   TempSrc:   Oral   SpO2: 91%  100% 95%  Weight:      Height:        Intake/Output Summary (Last 24 hours) at 12/18/2020 1212 Last data filed at 12/18/2020 1146 Gross per 24 hour  Intake 972.63 ml  Output 0 ml  Net 972.63 ml   Filed Weights   12/16/20 0500  12/17/20 0500 12/18/20 0500  Weight: 72.7 kg 58.1 kg 56.8 kg    Examination: General appearance:  adult female, sleepy and sluggish, in no distress.   HEENT: Anicteric, conjunctiva pink, lids and lashes normal. No nasal deformity, discharge, epistaxis.  Lips moist, edentulous.   Skin: Warm and dry. No jaundice.  No suspicious rashes or lesions. Cardiac: RRR, nl S1-S2, no murmurs appreciated.  Capillary refill is brisk.  JVP normal. No LE edema.  Radial  pulses 2+  and symmetric. Respiratory: Normal respiratory rate and rhythm.  CTAB without rales or wheezes. Abdomen: Abdomen soft. No TTP or guarding. No ascites, distension, hepatosplenomegaly.   MSK: No deformities or effusions. Neuro: Awake but sluggish.  EOMI, moves all extremities with severe generalized weakness t.    Psych: Somnolent, attention diminished affect blunted.  Judgment and insight appear impaired    Data Reviewed: I have personally reviewed following labs and imaging studies:  CBC: Recent Labs  Lab 12/14/20 0244 12/15/20 0417 12/17/20 0442 12/17/20 2130 12/18/20 0754  WBC 6.1 6.8 6.4  --  5.3  HGB 8.2* 8.3* 7.6* 8.2* 7.8*  HCT 28.5* 27.8* 24.7* 26.3* 27.2*  MCV 99.7 98.6 96.1  --  100.0  PLT 477* 473* 453*  --  431*   Basic Metabolic Panel: Recent Labs  Lab 12/14/20 0244 12/15/20 0417 12/17/20 0442 12/18/20 0754  NA 142 141 141 139  K 4.0 3.7 3.8 3.7  CL 108 108 109 108  CO2 24 24 21* 21*  GLUCOSE 101* 101* 121* 95  BUN 18 14 13 9   CREATININE 0.83 0.87 0.92 0.87  CALCIUM 8.7* 8.6* 8.4* 8.5*  MG 2.2 2.0  --   --   PHOS 3.3 3.4  --   --    GFR: Estimated Creatinine Clearance: 43.1 mL/min (by C-G formula based on SCr of 0.87 mg/dL). Liver Function Tests: Recent Labs  Lab 12/12/20 0409 12/14/20 0244 12/15/20 0417 12/17/20 0442 12/18/20 0754  AST 65* 57* 46* 43* 34  ALT 51* 45* 38 34 29  ALKPHOS 176* 187* 172* 169* 155*  BILITOT 1.3* 1.2 1.4* 1.3* 1.4*  PROT 6.7 6.7 6.7 6.1* 6.3*  ALBUMIN 2.6* 2.7* 2.7* 2.5* 2.5*   No results for input(s): LIPASE, AMYLASE in the last 168 hours. Recent Labs  Lab 12/12/20 1726  AMMONIA 24   Coagulation Profile: No results for input(s): INR, PROTIME in the last 168 hours. Cardiac Enzymes: Recent Labs  Lab 12/12/20 0409  CKTOTAL 50   BNP (last 3 results) No results for input(s): PROBNP in the last 8760 hours. HbA1C: No results for input(s): HGBA1C in the last 72 hours. CBG: Recent Labs  Lab 12/17/20 1111  12/17/20 1650 12/17/20 2004 12/18/20 0743 12/18/20 1157  GLUCAP 130* 96 96 95 90   Lipid Profile: No results for input(s): CHOL, HDL, LDLCALC, TRIG, CHOLHDL, LDLDIRECT in the last 72 hours. Thyroid Function Tests: No results for input(s): TSH, T4TOTAL, FREET4, T3FREE, THYROIDAB in the last 72 hours. Anemia Panel: No results for input(s): VITAMINB12, FOLATE, FERRITIN, TIBC, IRON, RETICCTPCT in the last 72 hours. Urine analysis:    Component Value Date/Time   COLORURINE YELLOW 11/23/2020 1853   APPEARANCEUR CLOUDY (A) 11/23/2020 1853   LABSPEC 1.015 11/23/2020 1853   PHURINE 5.0 11/23/2020 1853   GLUCOSEU NEGATIVE 11/23/2020 1853   HGBUR NEGATIVE 11/23/2020 1853   BILIRUBINUR NEGATIVE 11/23/2020 1853   KETONESUR NEGATIVE 11/23/2020 1853   PROTEINUR 30 (A) 11/23/2020 1853   NITRITE NEGATIVE 11/23/2020 1853  LEUKOCYTESUR MODERATE (A) 11/23/2020 1853   Sepsis Labs: @LABRCNTIP (procalcitonin:4,lacticacidven:4)  )No results found for this or any previous visit (from the past 240 hour(s)).       Radiology Studies: IR GASTROSTOMY TUBE MOD SED  Result Date: 12/18/2020 INDICATION: 80 year old female referred for gastrostomy placement EXAM: PERC PLACEMENT GASTROSTOMY MEDICATIONS: 2 g Ancef; Antibiotics were administered within 1 hour of the procedure. Scratch it ANESTHESIA/SEDATION: Versed 1.0 mg IV; Fentanyl 50 mcg IV Moderate Sedation Time:  10 minutes The patient was continuously monitored during the procedure by the interventional radiology nurse under my direct supervision. CONTRAST:  33mL OMNIPAQUE IOHEXOL 300 MG/ML SOLN - administered into the gastric lumen. FLUOROSCOPY TIME:  Fluoroscopy Time: 2 minutes 48 seconds (9 mGy). COMPLICATIONS: None PROCEDURE: Informed written consent was obtained from the patient and the patient's family after a thorough discussion of the procedural risks, benefits and alternatives. All questions were addressed. Maximal Sterile Barrier Technique was  utilized including caps, mask, sterile gowns, sterile gloves, sterile drape, hand hygiene and skin antiseptic. A timeout was performed prior to the initiation of the procedure. The epigastrium was prepped with Betadine in a sterile fashion, and a sterile drape was applied covering the operative field. A sterile gown and sterile gloves were used for the procedure. A 5-French orogastric tube is placed under fluoroscopic guidance. Scout imaging of the abdomen confirms barium within the transverse colon. The stomach was distended with gas. Under fluoroscopic guidance, an 18 gauge needle was utilized to puncture the anterior wall of the body of the stomach. An Amplatz wire was advanced through the needle passing a T fastener into the lumen of the stomach. The T fastener was secured for gastropexy. A 9-French sheath was inserted. A snare was advanced through the 9-French sheath. A 9m was advanced through the orogastric tube. It was snared then pulled out the oral cavity, pulling the snare, as well. The leading edge of the gastrostomy was attached to the snare. It was then pulled down the esophagus and out the percutaneous site. Tube secured in place. Contrast was injected. Patient tolerated the procedure well and remained hemodynamically stable throughout. No complications were encountered and no significant blood loss encountered. IMPRESSION: Status post fluoroscopic placed percutaneous gastrostomy tube, with 20 Teena Dunk pull-through. Signed, [place]. Yvone Neu, DO [facility] Electronically Signed   By: [facility] D.O.   On: 12/18/2020 11:11        Scheduled Meds: . amLODipine  5 mg Oral Daily  . chlorhexidine  15 mL Mouth Rinse BID  . Chlorhexidine Gluconate Cloth  6 each Topical Daily  . feeding supplement  237 mL Oral TID BM  . fentaNYL      . influenza vaccine adjuvanted  0.5 mL Intramuscular Tomorrow-1000  . insulin aspart  0-24 Units  Subcutaneous TID WC  . lactulose  20 g Oral BID  . levothyroxine  112.5 mcg Oral Q0600  . lidocaine      . mouth rinse  15 mL Mouth Rinse q12n4p  . melatonin  3 mg Oral QHS  . midazolam      . neomycin-bacitracin-polymyxin  1 application Topical Daily  . pantoprazole  40 mg Oral BID  . rifaximin  550 mg Oral BID  . spironolactone  25 mg Oral Daily  . thiamine injection  100 mg Intravenous Daily   Continuous Infusions: . sodium chloride 10 mL/hr at 12/10/20 0903  . dextrose 5 % and 0.45 % NaCl with KCl 10 mEq/L 65  mL/hr at 12/18/20 1122     LOS: 25 days    Time spent: 25-minute    Alberteen Sam, MD [facility] 12/18/2020, 12:12 PM     Please page though AMION or Epic secure chat:  For [employer], Higher education careers adviser

## 2020-12-18 NOTE — Progress Notes (Addendum)
Patient returned from procedural area. VSS. Appears lethrgic but able to response to pain, appeared in no distress. G-tube to low int suction. Pt's daughter's updated. Surgical site noted clean/dry/intact. Will continue to monitor .  POC:G-tube will be able to utilize at 948AM (2/15).

## 2020-12-18 NOTE — TOC Progression Note (Signed)
Transition of Care [facility]) - Progression Note    Patient Details  Name: Kaylee King MRN: [id number] Date of Birth: 1941/04/27  Transition of Care [facility]) CM/SW Contact  Eduard Roux, [place] Phone Number: 12/18/2020, 4:40 PM  Clinical Narrative:     CSW spoke with patient's daughter,Pamela- advised CSW needs a SNF choice-she states she is leaning towards [place] but would like to call and discuss with her brother. CSW requested to return call back today if possible or leave voice message of SNF choice.   CSW will continue to follow and assist with discharge planning.  Antony Blackbird, MSW, LCSW Clinical Social Worker   Expected Discharge Plan: Skilled Nursing Facility Barriers to Discharge: Continued Medical Work up,Insurance Authorization,SNF Pending bed offer  Expected Discharge Plan and Services Expected Discharge Plan: Skilled Nursing Facility In-house Referral: Clinical Social Work     Living arrangements for the past 2 months: Skilled Nursing Facility                                       Social Determinants of Health (SDOH) Interventions    Readmission Risk Interventions No flowsheet data found.

## 2020-12-18 NOTE — TOC Progression Note (Addendum)
Transition of Care [facility]) - Progression Note    Patient Details  Name: Kaylee King MRN: [id number] Date of Birth: [birth year]  Transition of Care [facility]) CM/SW Contact  Eduard Roux, [place] Phone Number: 12/18/2020, 3:22 PM  Clinical Narrative:     CSW called patient's daughter,Pamela( need bed choice )- left voice message to return call  Antony Blackbird, MSW, LCSW Clinical Social Worker   Expected Discharge Plan: Skilled Nursing Facility Barriers to Discharge: Continued Medical Work up,Insurance Authorization,SNF Pending bed offer  Expected Discharge Plan and Services Expected Discharge Plan: Skilled Nursing Facility In-house Referral: Clinical Social Work     Living arrangements for the past 2 months: Skilled Nursing Facility                                       Social Determinants of Health (SDOH) Interventions    Readmission Risk Interventions No flowsheet data found.

## 2020-12-19 DIAGNOSIS — J9601 Acute respiratory failure with hypoxia: Secondary | ICD-10-CM | POA: Diagnosis not present

## 2020-12-19 LAB — RESP PANEL BY RT-PCR (FLU A&B, COVID) ARPGX2
Influenza A by PCR: NEGATIVE
Influenza B by PCR: NEGATIVE
SARS Coronavirus 2 by RT PCR: NEGATIVE

## 2020-12-19 LAB — GLUCOSE, CAPILLARY
Glucose-Capillary: 100 mg/dL — ABNORMAL HIGH (ref 70–99)
Glucose-Capillary: 111 mg/dL — ABNORMAL HIGH (ref 70–99)

## 2020-12-19 MED ORDER — SPIRONOLACTONE 25 MG PO TABS: 25.0000 mg | ORAL_TABLET | Freq: Every day | ORAL | Status: DC

## 2020-12-19 MED ORDER — LACTULOSE 10 GM/15ML PO SOLN: 20.0000 g | Freq: Two times a day (BID) | ORAL | 0 refills | Status: DC

## 2020-12-19 MED ORDER — ACETAMINOPHEN 325 MG PO TABS: 650.0000 mg | ORAL_TABLET | Freq: Four times a day (QID) | ORAL | Status: AC | PRN

## 2020-12-19 MED ORDER — PROSOURCE TF PO LIQD
45.0000 mL | Freq: Every day | ORAL | Status: DC
  Administered 2020-12-19: 45 mL
  Filled 2020-12-19: qty 45

## 2020-12-19 MED ORDER — AMLODIPINE BESYLATE 5 MG PO TABS: 5.0000 mg | ORAL_TABLET | Freq: Every day | ORAL | Status: DC

## 2020-12-19 MED ORDER — FREE WATER
200.0000 mL | Freq: Four times a day (QID) | Status: DC
  Administered 2020-12-19: 200 mL

## 2020-12-19 MED ORDER — MORPHINE SULFATE 20 MG/5ML PO SOLN: 10.0000 mg | ORAL | 0 refills | Status: DC | PRN

## 2020-12-19 MED ORDER — LEVOTHYROXINE SODIUM 75 MCG PO TABS: 112.5000 ug | ORAL_TABLET | Freq: Every day | ORAL | Status: DC

## 2020-12-19 MED ORDER — THIAMINE HCL 100 MG PO TABS: 100.0000 mg | ORAL_TABLET | Freq: Every day | ORAL | Status: DC

## 2020-12-19 MED ORDER — PROSOURCE TF PO LIQD: 45.0000 mL | Freq: Every day | ORAL | Status: DC

## 2020-12-19 MED ORDER — RIFAXIMIN 550 MG PO TABS: 550.0000 mg | ORAL_TABLET | Freq: Two times a day (BID) | ORAL | Status: DC

## 2020-12-19 MED ORDER — OSMOLITE 1.2 CAL PO LIQD
1000.0000 mL | ORAL | Status: DC
  Administered 2020-12-19: 1000 mL

## 2020-12-19 MED ORDER — FREE WATER: 200.0000 mL | Freq: Four times a day (QID) | Status: DC

## 2020-12-19 MED ORDER — OSMOLITE 1.2 CAL PO LIQD: 1000.0000 mL | ORAL | 0 refills | Status: DC

## 2020-12-19 NOTE — Progress Notes (Signed)
IR.  History of dysphagia s/p percutaneous gastrostomy tune placement in IR 12/18/2020.  Patient laying in bed resting comfortably. She responds to voice and follows simple commands, however eyes remain closed during exam. No complaints. Gastrostomy tube hooked to LWS; site soft without active bleeding, drainage, or hematoma; bumper cinched to skin.  Gastrostomy tube stable, tube ready for full use. Remove tube from [facility] for use. Please ensure bumper cinched to skin to prevent leakage. Further plans per [facility]- appreciate and agree with management. Please call IR with questions/concerns.   Waylan Boga Shalita Notte, PA-C 12/19/2020, 9:29 AM

## 2020-12-19 NOTE — Progress Notes (Signed)
Report called to Antonette at [facility].

## 2020-12-19 NOTE — TOC Transition Note (Signed)
Transition of Care [facility]) - CM/SW Discharge Note   Patient Details  Name: Kaylee King MRN: [id number] Date of Birth: 06/07/1941  Transition of Care [facility] CM/SW Contact:  Eduard Roux, LCSWA Phone Number: 12/19/2020, 1:45 PM   Clinical Narrative:     Patient will Discharge to: [facility]  Discharge Date: 12/19/2020 Family Notified: daughter,Pamela Transport By: Sharin Mons  Per MD patient is ready for discharge. RN, patient, and facility notified of DC. Discharge Summary sent to facility. RN given number for [phone]. Ambulance transport requested for patient.   Clinical Social Worker signing off. Antony Blackbird, MSW, LCSW Clinical Social Worker    Final next level of care: Skilled Nursing Facility Barriers to Discharge: Barriers Resolved   Patient Goals and CMS Choice        Discharge Placement              Patient chooses bed at:  [facility]) Patient to be transferred to facility by: [facility] Name of family member notified: daughter,Pam Patient and family notified of of transfer: 12/19/20  Discharge Plan and Services In-house Referral: Clinical Social Work                                   Social Determinants of Health (SDOH) Interventions     Readmission Risk Interventions No flowsheet data found.

## 2020-12-19 NOTE — Progress Notes (Signed)
When assessing patient at 0400 this nurse noted L wrist IV had been removed by patient.  When looking for new site to place IV this RN noted the site on the R forearm where the IV was taken out earlier was red, edematous, warm to touch and and vein was hardened to touch under the skin. Put in IV team consult for new line for fluids. Will put warm compress on skin for comfort and continue to monitor.

## 2020-12-19 NOTE — Discharge Summary (Signed)
Physician Discharge Summary  Kaylee King:[id number] DOB: 11/19/1940 DOA: 11/23/2020  PCP: Fleet Contras, MD  Admit date: 11/23/2020 Discharge date: 12/19/2020  Admitted From: Home  Disposition:  SNF   Recommendations for Outpatient Follow-up:  1. Please refer to [facility] and consider Hospice enrollment 2. Please continue SLP therapy and if able to advance to full diet by mouth, refer for removal of PEG tube 3. Please apply a barrier dressing to sacral pressure injury 4. Check TSH in 3 months and adjust levothyroxine as needed 5. Check BMP and CBC in 1 week to monitor Cr, K, and Hgb      Home Health: N/A  Equipment/Devices: TBD at SNF  Discharge Condition: Declining  CODE STATUS: FULL Diet recommendation: Per tube  Brief/Interim Summary: Kaylee King is a 80 y.o. F with cirrhosis and alcohol use who was admitted with hemorrhagic shock due to variceal bleeding.  Received 6 units PRBCs and 2 units FFP. Intubated for airway protection, diagnosed with pneumonia and treated with 7 days ceftriaxone.      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 Intubated for airway protection.   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 Patient had several nonbleeding gastric ulcers.  She was started on PPI and had no further bleeding.    Continue PPI    Acute on chronic hypoxic respiratory failure with hypercapnia due to COPD and pneumonia The patient was diagnosed with pneumonia and treated with 7 days ceftriaxone.  She was weaned off of ventilator, and was stable for several days until she developed acute on chronic respiratory failure again with hypoxia and hypercapnia on 2/4. She was placed on BiPAP, for 24 hours, and improved after that.  Continue oxygen.   Continue bronchodilators.    Acute metabolic encephalopathy Patient had negative CT head and MRI brain. Ammonia normal. EEG showed diffuse encephalopathy without seizure activity. She had hypothyroidism, this was started on treatment. Lactulose and rifaximin were started. She was given Warnicke dose thiamine, and is discharged on iron supplementation.  It is likely that her acute metabolic encephalopathy with delirium in setting of critical illness.   Recommend rehabilitation and continued nutritional optimization.\    Poor oral intake, failure to thrive Due to encephalopathy, the patient's oral intake was minimal. She often refused food.  PEG tube was placed after calorie counting showed the patient was not able to take enough calories and fluids to meet her nutrition and fluid needs.  As her encephalopathy is rehabilitated, and her mentation improves, and her function improves, she may be able to advance to an oral diet again with speech therapy training.  If that were to happen, would be reasonable to remove her PEG tube.  Provide Osmolite 1.2 at 65 ml/hr, Prosource 45 ml BID and 100 cc free water four times per day.   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         Discharge Instructions  Discharge Instructions    Increase activity slowly   Complete by: As directed    No wound care   Complete by: As directed      Allergies as of 12/19/2020   No Known Allergies     Medication List    STOP taking these medications  aspirin-acetaminophen-caffeine 250-250-65 MG tablet Commonly known as: EXCEDRIN MIGRAINE   naproxen sodium 220 MG tablet Commonly known as: ALEVE     TAKE these medications   acetaminophen 325 MG tablet Commonly known as: TYLENOL Place 2 tablets (650 mg total) into feeding tube every 6 (six) hours as needed for headache,  fever or mild pain.   amLODipine 5 MG tablet Commonly known as: NORVASC Place 1 tablet (5 mg total) into feeding tube daily. Start taking on: December 20, 2020   feeding supplement (OSMOLITE 1.2 CAL) Liqd Place 1,000 mLs into feeding tube continuous.   feeding supplement (PROSource TF) liquid Place 45 mLs into feeding tube daily.   free water Soln Place 200 mLs into feeding tube every 6 (six) hours.   lactulose 10 GM/15ML solution Commonly known as: CHRONULAC Place 30 mLs (20 g total) into feeding tube 2 (two) times daily.   levothyroxine 75 MCG tablet Commonly known as: SYNTHROID Place 1.5 tablets (112.5 mcg total) into feeding tube daily at 6 (six) AM. Start taking on: December 20, 2020   morphine 20 MG/5ML solution Take 2.5 mLs (10 mg total) by mouth every 4 (four) hours as needed for pain.   rifaximin 550 MG Tabs tablet Commonly known as: XIFAXAN Place 1 tablet (550 mg total) into feeding tube 2 (two) times daily.   spironolactone 25 MG tablet Commonly known as: ALDACTONE Place 1 tablet (25 mg total) into feeding tube daily. Start taking on: December 20, 2020   thiamine 100 MG tablet Place 1 tablet (100 mg total) into feeding tube daily.       No Known Allergies  Consultations:  Gastroenterology  Flight of care  Neurology  Interventional radiology   Procedures/Studies: CT ABDOMEN WO CONTRAST  Result Date: 12/16/2020 CLINICAL DATA:  Assess anatomy for possible percutaneous gastrostomy tube placement EXAM: CT ABDOMEN WITHOUT CONTRAST TECHNIQUE: Multidetector CT imaging of the abdomen was performed following the standard protocol without IV contrast. COMPARISON:  None. FINDINGS: Lower chest: Multifocal patchy airspace opacities in the dependent aspect of the left lower lobe, and to a lesser extent the right lower lobe. Trace bilateral pleural effusions. Enlarged main pulmonary artery measuring up to 4 cm in diameter raising the possibility of underlying  pulmonary arterial hypertension. Atherosclerotic calcifications visualized throughout the coronary arteries. The heart itself is normal in size. Trace pericardial fluid versus thickening. Ectatic but nonaneurysmal descending thoracic aorta with scattered atherosclerotic plaque. Hepatobiliary: Limited evaluation in the absence of intravenous contrast. Grossly normal morphology and contour. Gallbladder is unremarkable. No intra or extrahepatic biliary ductal dilatation. Pancreas: Unremarkable. No pancreatic ductal dilatation or surrounding inflammatory changes. Spleen: Normal in size without focal abnormality. Adrenals/Urinary Tract: Small nephrolithiasis bilaterally. No evidence of hydronephrosis. No exophytic renal mass. Limited evaluation in the absence of intravenous contrast. Stomach/Bowel: Normal anatomic position of the stomach without colonic interposition. No focal bowel wall thickening or evidence of obstruction. Vascular/Lymphatic: Limited evaluation in the absence of intravenous contrast. Atherosclerotic calcifications throughout the abdominal aorta. Mild aneurysmal dilation of the juxtarenal abdominal aorta measuring up to 3.2 cm. No suspicious lymphadenopathy. Other: Expansion of the right psoas muscle relative to the left with central low attenuation. Nonspecific stranding versus small volume fluid along the right pararenal fascia, likely representing a small amount of hemorrhage. Musculoskeletal: Chronic appearing compression fractures at T10 and T12. Mild levoconvex scoliosis of the lumbar spine with associated multilevel degenerative disc disease. IMPRESSION: 1. Anatomy is suitable for percutaneous gastrostomy tube placement. 2. Relative expansion and central low attenuation  of the right psoas muscle relative to the left. Differential considerations include intro psoas hematoma versus abscess. 3. 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 Recommend follow-up every 3  years. This recommendation follows ACR consensus guidelines: White Paper of the ACR Incidental Findings Committee II on Vascular Findings. J Am Coll Radiol 2013; 10:789-794. Aortic Atherosclerosis (ICD10-I70.0); Aortic aneurysm NOS (ICD10-I71.9). 4. Multifocal patchy airspace opacities in the left greater than right lower lobe concerning for pneumonia versus aspiration. 5. Enlarged main pulmonary artery suggests pulmonary arterial hypertension. 6. Small nonobstructing nephrolithiasis bilaterally. 7. Chronic appearing T12 and T10 compression fractures. Electronically Signed   By: Malachy MoanHeath  McCullough M.D.   On: 12/16/2020 11:17   DG Chest 1 View  Result Date: 11/27/2020 CLINICAL DATA:  Pneumonia.  Patient is intubated. EXAM: CHEST  1 VIEW COMPARISON:  One-view chest x-ray 11/25/2020 FINDINGS: Endotracheal tube is stable and in satisfactory position. Left IJ catheter is also stable and in satisfactory position. Heart is enlarged. Atherosclerotic calcifications are again noted at the arch. Bilateral pleural effusions and bibasilar airspace disease are similar the prior study. IMPRESSION: 1. Stable chest x-ray with bilateral pleural effusions and bibasilar airspace disease. 2. Stable support apparatus. Electronically Signed   By: Marin Robertshristopher  Mattern M.D.   On: 11/27/2020 08:56   CT Head Wo Contrast  Result Date: 11/23/2020 CLINICAL DATA:  Headache, altered mental status. EXAM: CT HEAD WITHOUT CONTRAST TECHNIQUE: Contiguous axial images were obtained from the base of the skull through the vertex without intravenous contrast. COMPARISON:  None. FINDINGS: Brain: The brainstem, cerebellum, cerebral peduncles, thalami, basal ganglia, basilar cisterns, and ventricular system appear within normal limits. Periventricular white matter and corona radiata hypodensities favor chronic ischemic microvascular white matter disease. No intracranial hemorrhage, mass lesion, or acute CVA. Vascular: There is atherosclerotic  calcification of the cavernous carotid arteries bilaterally. Skull: Unremarkable Sinuses/Orbits: Unremarkable Other: No supplemental non-categorized findings. IMPRESSION: 1. No acute intracranial findings. 2. Periventricular white matter and corona radiata hypodensities favor chronic ischemic microvascular white matter disease. Electronically Signed   By: Gaylyn RongWalter  Liebkemann M.D.   On: 11/23/2020 13:55   MR BRAIN WO CONTRAST  Result Date: 12/12/2020 CLINICAL DATA:  Mental status change, unknown cause. Additional history provided: Patient presenting with coffee ground emesis, hemorrhagic shock, encephalopathy. Presumed pneumonia. EXAM: MRI HEAD WITHOUT CONTRAST TECHNIQUE: Multiplanar, multiecho pulse sequences of the brain and surrounding structures were obtained without intravenous contrast. COMPARISON:  Head CT 11/23/2020. FINDINGS: Brain: Multiple sequences are significantly motion degraded, limiting evaluation. Most notably, there is moderate motion degradation of the axial T2 weighted sequence, moderate motion degradation of the axial T2/FLAIR sequence, moderate motion degradation of the axial SWI sequence, moderately severe motion degradation of the sagittal T1 weighted sequence and moderate/severe motion degradation of the coronal T2 weighted sequence. Moderate generalized cerebral atrophy with comparatively mild cerebellar atrophy. Chronic left basal ganglia lacunar infarcts. Moderately severe multifocal T2/FLAIR hyperintensity within the cerebral white matter is nonspecific, but compatible with chronic small vessel ischemic disease. The diffusion-weighted imaging is of good quality. No evidence of acute infarction. Small foci of SWI signal loss within the right corona radiata and left thalamus likely reflect chronic microhemorrhages. An additional curvilinear focus of SWI signal loss within the left cerebellar hemisphere may reflect a chronic microhemorrhage or small developmental venous anomaly (an  anatomic variant). No evidence of intracranial mass. No extra-axial fluid collection. No midline shift. Vascular: Expected proximal arterial flow voids. Skull and upper cervical spine: Within the limitations of motion degradation,  no focal marrow lesion is identified. Sinuses/Orbits: Visualized orbits show no acute finding. Mild ethmoid sinus mucosal thickening. Other: Bilateral mastoid effusions. IMPRESSION: Motion degraded examination, as described and limiting evaluation. The diffusion-weighted imaging is of good quality. No evidence of acute infarct or other acute intracranial abnormality. Chronic left basal ganglia lacunar infarcts. Moderately severe cerebral white matter chronic small vessel ischemic disease. Moderate cerebral atrophy with mild cerebellar atrophy. Mild ethmoid sinus mucosal thickening. Bilateral mastoid effusions. Electronically Signed   By: Jackey Loge DO   On: 12/12/2020 09:01   IR GASTROSTOMY TUBE MOD SED  Result Date: 12/18/2020 INDICATION: 80 year old female referred for gastrostomy placement EXAM: PERC PLACEMENT GASTROSTOMY MEDICATIONS: 2 g Ancef; Antibiotics were administered within 1 hour of the procedure. Scratch it ANESTHESIA/SEDATION: Versed 1.0 mg IV; Fentanyl 50 mcg IV Moderate Sedation Time:  10 minutes The patient was continuously monitored during the procedure by the interventional radiology nurse under my direct supervision. CONTRAST:  10mL OMNIPAQUE IOHEXOL 300 MG/ML SOLN - administered into the gastric lumen. FLUOROSCOPY TIME:  Fluoroscopy Time: 2 minutes 48 seconds (9 mGy). COMPLICATIONS: None PROCEDURE: Informed written consent was obtained from the patient and the patient's family after a thorough discussion of the procedural risks, benefits and alternatives. All questions were addressed. Maximal Sterile Barrier Technique was utilized including caps, mask, sterile gowns, sterile gloves, sterile drape, hand hygiene and skin antiseptic. A timeout was performed prior to  the initiation of the procedure. The epigastrium was prepped with Betadine in a sterile fashion, and a sterile drape was applied covering the operative field. A sterile gown and sterile gloves were used for the procedure. A 5-French orogastric tube is placed under fluoroscopic guidance. Scout imaging of the abdomen confirms barium within the transverse colon. The stomach was distended with gas. Under fluoroscopic guidance, an 18 gauge needle was utilized to puncture the anterior wall of the body of the stomach. An Amplatz wire was advanced through the needle passing a T fastener into the lumen of the stomach. The T fastener was secured for gastropexy. A 9-French sheath was inserted. A snare was advanced through the 9-French sheath. A Teena Dunk was advanced through the orogastric tube. It was snared then pulled out the oral cavity, pulling the snare, as well. The leading edge of the gastrostomy was attached to the snare. It was then pulled down the esophagus and out the percutaneous site. Tube secured in place. Contrast was injected. Patient tolerated the procedure well and remained hemodynamically stable throughout. No complications were encountered and no significant blood loss encountered. IMPRESSION: Status post fluoroscopic placed percutaneous gastrostomy tube, with 20 [place] pull-through. Signed, Yvone Neu. Loreta Ave, DO [facility] Electronically Signed   By: Gilmer Mor D.O.   On: 12/18/2020 11:11   DG Chest Port 1 View  Result Date: 12/10/2020 CLINICAL DATA:  Hypoxemia. EXAM: PORTABLE CHEST 1 VIEW COMPARISON:  One-view chest x-ray 12/06/2020 FINDINGS: Small bore feeding tube extends off the inferior border the film. The heart is enlarged. Aeration of both lungs is improved. Mild interstitial pattern remains. Bibasilar airspace opacities are present. Small effusions remain, right greater than left. IMPRESSION: 1. Markedly improved aeration of both lungs.  2. Persistent mild interstitial pattern and bilateral pleural effusions, right greater than left. Electronically Signed   By: Marin Roberts M.D.   On: 12/10/2020 13:04   DG Chest Port 1 View  Result Date: 12/06/2020 CLINICAL DATA:  Hypoxemia. EXAM: PORTABLE CHEST 1 VIEW COMPARISON:  November 29, 2020. FINDINGS:  Stable cardiomegaly. No pneumothorax is noted. Feeding tube is seen entering stomach. Bibasilar opacities are noted concerning for atelectasis or infiltrates with associated pleural effusions. Bony thorax is unremarkable. IMPRESSION: Bibasilar opacities are noted concerning for atelectasis or infiltrates with associated pleural effusions. Electronically Signed   By: Lupita Raider M.D.   On: 12/06/2020 12:28   DG Chest Port 1 View  Result Date: 11/29/2020 CLINICAL DATA:  Abnormal respirations. EXAM: PORTABLE CHEST 1 VIEW COMPARISON:  11/27/2020. FINDINGS: Interim placement of feeding tube, its tip is below the left hemidiaphragm. Endotracheal tube, left IJ line stable position. Cardiomegaly. Bilateral mild interstitial prominence. Interstitial edema and/or pneumonitis can not be excluded. Bibasilar atelectasis. Small bilateral pleural effusions cannot be excluded. IMPRESSION: 1. Interim placement of feeding tube, its tip is below left hemidiaphragm. Endotracheal tube and left IJ line stable position. 2.  Cardiomegaly. 3. Mild bilateral interstitial prominence. Interstitial edema and/or pneumonitis cannot be excluded. Electronically Signed   By: Maisie Fus  Register   On: 11/29/2020 05:38   DG CHEST PORT 1 VIEW  Result Date: 11/25/2020 CLINICAL DATA:  Respiratory failure, altered mental status EXAM: PORTABLE CHEST 1 VIEW COMPARISON:  11/24/2020 FINDINGS: Endotracheal tube terminates 4.5 cm above the carina. Left IJ venous catheter terminates in the distal left brachiocephalic vein, unchanged. Mild patchy left lower lobe opacity, suspicious for pneumonia. Suspected small bilateral pleural  effusions. No pneumothorax. The heart is normal in size.  Thoracic aortic atherosclerosis. IMPRESSION: Endotracheal tube terminates 4.5 cm above the carina. Mild patchy left lower lobe opacity, suspicious for pneumonia. Suspected small bilateral pleural effusions. Electronically Signed   By: Charline Bills M.D.   On: 11/25/2020 11:53   DG Chest Port 1 View  Result Date: 11/24/2020 CLINICAL DATA:  Hypoxia EXAM: PORTABLE CHEST 1 VIEW COMPARISON:  November 23, 2020 FINDINGS: Endotracheal tube tip is 1.4 cm above the carina. Central catheter tip in region of left innominate vein, stable. No pneumothorax. There is ill-defined opacity in the left base, similar to 1 day prior. Areas of atelectatic change noted in each perihilar region. Heart is mildly enlarged with pulmonary vascular normal. No adenopathy. There is aortic atherosclerosis. No bone lesions. IMPRESSION: Tube and catheter positions as described without pneumothorax. Ill-defined opacity left base concerning for focal pneumonia with atelectasis. There is perihilar atelectatic change as well. Stable cardiac silhouette. Aortic Atherosclerosis (ICD10-I70.0). Electronically Signed   By: Bretta Bang III M.D.   On: 11/24/2020 07:59   DG Chest Portable 1 View  Result Date: 11/23/2020 CLINICAL DATA:  Post central line placement EXAM: PORTABLE CHEST 1 VIEW COMPARISON:  11/23/2020 FINDINGS: Interval intubation, tip of the endotracheal tube is about 3.8 cm superior to the carina. Left-sided central venous catheter with tip projecting over the SVC origin. Low lung volumes. Airspace disease at the left base. Enlarged cardiomediastinal silhouette with aortic atherosclerosis. No pneumothorax. IMPRESSION: 1. Interval intubation with tip of endotracheal tube about 3.8 cm superior to the carina. Left-sided central venous catheter tip overlies the SVC origin. No pneumothorax 2. Low lung volumes with left basilar airspace disease which may reflect atelectasis,  pneumonia or aspiration Electronically Signed   By: Jasmine Pang M.D.   On: 11/23/2020 19:04   DG Chest Portable 1 View  Result Date: 11/23/2020 CLINICAL DATA:  Endotracheal tube placement. EXAM: PORTABLE CHEST 1 VIEW COMPARISON:  None. FINDINGS: An endotracheal tube is seen with its distal tip approximately 4.1 cm from the carina. Mild atelectasis and/or infiltrate is seen within the left lung base. This is  increased in severity when compared to the prior study. There is no evidence of a pleural effusion or pneumothorax. The heart size and mediastinal contours are within normal limits. The visualized skeletal structures are unremarkable. IMPRESSION: 1. Endotracheal tube in good position. 2. Mild left basilar atelectasis and/or infiltrate, increased in severity when compared to the prior study. Electronically Signed   By: Aram Candela M.D.   On: 11/23/2020 18:05   DG Chest Port 1 View  Result Date: 11/23/2020 CLINICAL DATA:  Weakness declined appetite increased confusion x2 days EXAM: PORTABLE CHEST 1 VIEW COMPARISON:  None. FINDINGS: The heart size and mediastinal contours are mildly enlarged, accentuated by technique. Aortic atherosclerosis. Streaky left lower lobe opacity. No pleural effusion or pneumothorax. Thoracic spondylosis and bilateral AC joints DJD. IMPRESSION: Streaky left lower lobe opacity, likely atelectasis but pneumonia not excluded. Electronically Signed   By: Maudry Mayhew MD   On: 11/23/2020 13:07   DG Abd Portable 1V  Result Date: 12/10/2020 CLINICAL DATA:  Determine of the tip of the feeding tube is metallic prior to MRI. EXAM: PORTABLE ABDOMEN - 1 VIEW COMPARISON:  None. FINDINGS: Feeding tube with the tip projecting over the antrum of the stomach. Tip of the feeding tube appears to be metallic. No bowel dilatation to suggest obstruction. No evidence of pneumoperitoneum, portal venous gas or pneumatosis. No pathologic calcifications along the expected course of the ureters.  Bibasilar airspace disease which may reflect atelectasis versus pneumonia. No acute osseous abnormality. IMPRESSION: Feeding tube with the tip projecting over the antrum of the stomach. Tip of the feeding tube appears to be metallic. Recommend correlation with manufacturer guidelines as to whether the feeding tube is MRI compatible or not. Electronically Signed   By: Elige Ko   On: 12/10/2020 12:08   EEG adult  Result Date: 12/11/2020 Charlsie Quest, MD     12/11/2020  2:42 PM Patient Name: Kaylee King MRN: [id number] Epilepsy Attending: Charlsie Quest Referring Physician/Provider: Dr Candelaria Stagers Date: 12/11/2020 Duration: 24.37 mins Patient history: 80yo F with ams. EEG to evaluate for seizure Level of alertness: Awake AEDs during EEG study: None Technical aspects: This EEG study was done with scalp electrodes positioned according to the 10-20 International system of electrode placement. Electrical activity was acquired at a sampling rate of 500Hz  and reviewed with a high frequency filter of 70Hz  and a low frequency filter of 1Hz . EEG data were recorded continuously and digitally stored. Description: The posterior dominant rhythm consists of 8 Hz activity of moderate voltage (25-35 uV) seen predominantly in posterior head regions, symmetric and reactive to eye opening and eye closing. EEG showed continuous generalized 5 to 6 Hz theta slowing.  Hyperventilation and photic stimulation were not performed.   ABNORMALITY -Continuous slow, generalized IMPRESSION: This study is suggestive of mild diffuse encephalopathy, nonspecific etiology. No seizures or epileptiform discharges were seen throughout the recording. Priyanka       Subjective: Patient has some diffuse crampy mild nominal discomfort. She has no blurring, no redness or drainage around her PEG tube. No fever. No respiratory distress. No vomiting. She remains somnolent.  Discharge Exam: Vitals:   12/18/20 2348 12/19/20 0310  BP: (!)  113/58 128/70  Pulse: 72 69  Resp: 20 20  Temp: 98.5 F (36.9 C) (!) 97.4 F (36.3 C)  SpO2: 91% 92%   Vitals:   12/18/20 2026 12/18/20 2348 12/19/20 0310 12/19/20 0500  BP: 112/72 (!) 113/58 128/70   Pulse: 70 72 69  Resp: Temp: 98.7 F (37.1 C) 98.5 F (36.9 C) (!) 97.4 F (36.3 C)   TempSrc: Oral Oral Oral   SpO2: 90% 91% 92%   Weight:    72.2 kg  Height:        General: Patient is somnolent, lying in bed, interactive, responds to questions but does not open her eyes or follow commands consistently.   Cardiovascular: RRR, nl S1-S2, no murmurs appreciated.   No LE edema.   Respiratory: Normal respiratory rate and rhythm.  CTAB without rales or wheezes. Abdominal: Abdomen soft and diffusely tender without guarding or rebound. No distension or HSM.  PEG tube in place. Neuro/Psych: Does not consistently follow commands, but appears to have symmetric movements of the upper extremities. Speech is fluent. Judgment and insight appear impaired.   The results of significant diagnostics from this hospitalization (including imaging, microbiology, ancillary and laboratory) are listed below for reference.     Microbiology: Recent Results (from the past 240 hour(s))  Resp Panel by RT-PCR (Flu A&B, Covid) Nasopharyngeal Swab     Status: None   Collection Time: 12/19/20  8:56 AM   Specimen: Nasopharyngeal Swab; Nasopharyngeal(NP) swabs in vial transport medium  Result Value Ref Range Status   SARS Coronavirus 2 by RT PCR NEGATIVE NEGATIVE Final    Comment: (NOTE) SARS-CoV-2 target nucleic acids are NOT DETECTED.  The SARS-CoV-2 RNA is generally detectable in upper respiratory specimens during the acute phase of infection. The lowest concentration of SARS-CoV-2 viral copies this assay can detect is 138 copies/mL. A negative result does not preclude SARS-Cov-2 infection and should not be used as the sole basis for treatment or other patient management decisions. A negative  result may occur with  improper specimen collection/handling, submission of specimen other than nasopharyngeal swab, presence of viral mutation(s) within the areas targeted by this assay, and inadequate number of viral copies(<138 copies/mL). A negative result must be combined with clinical observations, patient history, and epidemiological information. The expected result is Negative.  Fact Sheet for Patients:  BloggerCourse.com  Fact Sheet for Healthcare Providers:  SeriousBroker.it  This test is no t yet approved or cleared by the [place] FDA and  has been authorized for detection and/or diagnosis of SARS-CoV-2 by FDA under an Emergency Use Authorization (EUA). This EUA will remain  in effect (meaning this test can be used) for the duration of the COVID-19 declaration under Section 564(b)(1) of the Act, 21 U.S.C.section 360bbb-3(b)(1), unless the authorization is terminated  or revoked sooner.       Influenza A by PCR NEGATIVE NEGATIVE Final   Influenza B by PCR NEGATIVE NEGATIVE Final    Comment: (NOTE) The Xpert Xpress SARS-CoV-2/FLU/RSV plus assay is intended as an aid in the diagnosis of influenza from Nasopharyngeal swab specimens and should not be used as a sole basis for treatment. Nasal washings and aspirates are unacceptable for Xpert Xpress SARS-CoV-2/FLU/RSV testing.  Fact Sheet for Patients: BloggerCourse.com  Fact Sheet for Healthcare Providers: SeriousBroker.it  This test is not yet approved or cleared by the [place] FDA and has been authorized for detection and/or diagnosis of SARS-CoV-2 by FDA under an Emergency Use Authorization (EUA). This EUA will remain in effect (meaning this test can be used) for the duration of the COVID-19 declaration under Section 564(b)(1) of the Act, 21 U.S.C. section 360bbb-3(b)(1), unless the authorization is  terminated or revoked.  Performed at [facility], [address] [place], [place] [address]  Labs: BNP (last 3 results) Recent Labs    11/27/20 0523 12/06/20 1249  BNP 455.7* 231.5*   Basic Metabolic Panel: Recent Labs  Lab 12/14/20 0244 12/15/20 0417 12/17/20 0442 12/18/20 0754  NA 142 141 141 139  K 4.0 3.7 3.8 3.7  CL 108 108 109 108  CO2 24 24 21* 21*  GLUCOSE 101* 101* 121* 95  BUN CREATININE 0.83 0.87 0.92 0.87  CALCIUM 8.7* 8.6* 8.4* 8.5*  MG 2.2 2.0  --   --   PHOS 3.3 3.4  --   --    Liver Function Tests: Recent Labs  Lab 12/14/20 0244 12/15/20 0417 12/17/20 0442 12/18/20 0754  AST 57* 46* 43* 34  ALT 45* 38 34 29  ALKPHOS 187* 172* 169* 155*  BILITOT 1.2 1.4* 1.3* 1.4*  PROT 6.7 6.7 6.1* 6.3*  ALBUMIN 2.7* 2.7* 2.5* 2.5*   No results for input(s): LIPASE, AMYLASE in the last 168 hours. Recent Labs  Lab 12/12/20 1726  AMMONIA 24   CBC: Recent Labs  Lab 12/14/20 0244 12/15/20 0417 12/17/20 0442 12/17/20 2130 12/18/20 0754  WBC 6.1 6.8 6.4  --  5.3  HGB 8.2* 8.3* 7.6* 8.2* 7.8*  HCT 28.5* 27.8* 24.7* 26.3* 27.2*  MCV 99.7 98.6 96.1  --  100.0  PLT 477* 473* 453*  --  431*   Cardiac Enzymes: No results for input(s): CKTOTAL, CKMB, CKMBINDEX, TROPONINI in the last 168 hours. BNP: Invalid input(s): POCBNP CBG: Recent Labs  Lab 12/18/20 1534 12/18/20 2154 12/18/20 2354 12/19/20 0807 12/19/20 1142  GLUCAP 94 99 95 100* 111*   D-Dimer No results for input(s): DDIMER in the last 72 hours. Hgb A1c No results for input(s): HGBA1C in the last 72 hours. Lipid Profile No results for input(s): CHOL, HDL, LDLCALC, TRIG, CHOLHDL, LDLDIRECT in the last 72 hours. Thyroid function studies No results for input(s): TSH, T4TOTAL, T3FREE, THYROIDAB in the last 72 hours.  Invalid input(s): FREET3 Anemia work up No results for input(s): VITAMINB12, FOLATE, FERRITIN, TIBC, IRON, RETICCTPCT in the last 72  hours. Urinalysis    Component Value Date/Time   COLORURINE YELLOW 11/23/2020 1853   APPEARANCEUR CLOUDY (A) 11/23/2020 1853   LABSPEC 1.015 11/23/2020 1853   PHURINE 5.0 11/23/2020 1853   GLUCOSEU NEGATIVE 11/23/2020 1853   HGBUR NEGATIVE 11/23/2020 1853   BILIRUBINUR NEGATIVE 11/23/2020 1853   KETONESUR NEGATIVE 11/23/2020 1853   PROTEINUR 30 (A) 11/23/2020 1853   NITRITE NEGATIVE 11/23/2020 1853   LEUKOCYTESUR MODERATE (A) 11/23/2020 1853   Sepsis Labs Invalid input(s): PROCALCITONIN,  WBC,  LACTICIDVEN Microbiology Recent Results (from the past 240 hour(s))  Resp Panel by RT-PCR (Flu A&B, Covid) Nasopharyngeal Swab     Status: None   Collection Time: 12/19/20  8:56 AM   Specimen: Nasopharyngeal Swab; Nasopharyngeal(NP) swabs in vial transport medium  Result Value Ref Range Status   SARS Coronavirus 2 by RT PCR NEGATIVE NEGATIVE Final    Comment: (NOTE) SARS-CoV-2 target nucleic acids are NOT DETECTED.  The SARS-CoV-2 RNA is generally detectable in upper respiratory specimens during the acute phase of infection. The lowest concentration of SARS-CoV-2 viral copies this assay can detect is 138 copies/mL. A negative result does not preclude SARS-Cov-2 infection and should not be used as the sole basis for treatment or other patient management decisions. A negative result may occur with  improper specimen collection/handling, submission of specimen other than nasopharyngeal swab, presence of viral mutation(s) within the areas targeted by  this assay, and inadequate number of viral copies(<138 copies/mL). A negative result must be combined with clinical observations, patient history, and epidemiological information. The expected result is Negative.  Fact Sheet for Patients:  BloggerCourse.com  Fact Sheet for Healthcare Providers:  SeriousBroker.it  This test is no t yet approved or cleared by the [place] FDA and  has  been authorized for detection and/or diagnosis of SARS-CoV-2 by FDA under an Emergency Use Authorization (EUA). This EUA will remain  in effect (meaning this test can be used) for the duration of the COVID-19 declaration under Section 564(b)(1) of the Act, 21 U.S.C.section 360bbb-3(b)(1), unless the authorization is terminated  or revoked sooner.       Influenza A by PCR NEGATIVE NEGATIVE Final   Influenza B by PCR NEGATIVE NEGATIVE Final    Comment: (NOTE) The Xpert Xpress SARS-CoV-2/FLU/RSV plus assay is intended as an aid in the diagnosis of influenza from Nasopharyngeal swab specimens and should not be used as a sole basis for treatment. Nasal washings and aspirates are unacceptable for Xpert Xpress SARS-CoV-2/FLU/RSV testing.  Fact Sheet for Patients: BloggerCourse.com  Fact Sheet for Healthcare Providers: SeriousBroker.it  This test is not yet approved or cleared by the [place] FDA and has been authorized for detection and/or diagnosis of SARS-CoV-2 by FDA under an Emergency Use Authorization (EUA). This EUA will remain in effect (meaning this test can be used) for the duration of the COVID-19 declaration under Section 564(b)(1) of the Act, 21 U.S.C. section 360bbb-3(b)(1), unless the authorization is terminated or revoked.  Performed at [facility], [address] [place], [place] [address]      Time coordinating discharge: 35 minutes The [place] controlled substances registry was reviewed for this patient       SIGNED:   Alberteen Sam, MD  [facility] 12/19/2020, 1:17 PM

## 2021-01-06 ENCOUNTER — Inpatient Hospital Stay (HOSPITAL_COMMUNITY)
Admission: EM | Admit: 2021-01-06 | Discharge: 2021-01-12 | DRG: 871 | Disposition: A | Discharge status: Skilled Nursing Facility | Payer: Medicare Other | Source: Skilled Nursing Facility | Attending: Internal Medicine | Admitting: Internal Medicine

## 2021-01-06 ENCOUNTER — Emergency Department (HOSPITAL_COMMUNITY): Payer: Medicare Other

## 2021-01-06 DIAGNOSIS — Z7401 Bed confinement status: Secondary | ICD-10-CM

## 2021-01-06 DIAGNOSIS — I8510 Secondary esophageal varices without bleeding: Secondary | ICD-10-CM | POA: Diagnosis present

## 2021-01-06 DIAGNOSIS — B952 Enterococcus as the cause of diseases classified elsewhere: Secondary | ICD-10-CM | POA: Diagnosis present

## 2021-01-06 DIAGNOSIS — Z931 Gastrostomy status: Secondary | ICD-10-CM | POA: Diagnosis not present

## 2021-01-06 DIAGNOSIS — R1310 Dysphagia, unspecified: Secondary | ICD-10-CM

## 2021-01-06 DIAGNOSIS — K7290 Hepatic failure, unspecified without coma: Secondary | ICD-10-CM | POA: Diagnosis not present

## 2021-01-06 DIAGNOSIS — E8809 Other disorders of plasma-protein metabolism, not elsewhere classified: Secondary | ICD-10-CM | POA: Diagnosis present

## 2021-01-06 DIAGNOSIS — Z1621 Resistance to vancomycin: Secondary | ICD-10-CM | POA: Diagnosis present

## 2021-01-06 DIAGNOSIS — Z781 Physical restraint status: Secondary | ICD-10-CM

## 2021-01-06 DIAGNOSIS — Z20822 Contact with and (suspected) exposure to covid-19: Secondary | ICD-10-CM | POA: Diagnosis present

## 2021-01-06 DIAGNOSIS — J9811 Atelectasis: Secondary | ICD-10-CM | POA: Diagnosis present

## 2021-01-06 DIAGNOSIS — K7581 Nonalcoholic steatohepatitis (NASH): Secondary | ICD-10-CM | POA: Diagnosis present

## 2021-01-06 DIAGNOSIS — M7981 Nontraumatic hematoma of soft tissue: Secondary | ICD-10-CM | POA: Diagnosis present

## 2021-01-06 DIAGNOSIS — K3189 Other diseases of stomach and duodenum: Secondary | ICD-10-CM | POA: Diagnosis present

## 2021-01-06 DIAGNOSIS — N390 Urinary tract infection, site not specified: Secondary | ICD-10-CM | POA: Diagnosis present

## 2021-01-06 DIAGNOSIS — K7460 Unspecified cirrhosis of liver: Secondary | ICD-10-CM | POA: Diagnosis present

## 2021-01-06 DIAGNOSIS — B965 Pseudomonas (aeruginosa) (mallei) (pseudomallei) as the cause of diseases classified elsewhere: Secondary | ICD-10-CM | POA: Diagnosis present

## 2021-01-06 DIAGNOSIS — E039 Hypothyroidism, unspecified: Secondary | ICD-10-CM | POA: Diagnosis not present

## 2021-01-06 DIAGNOSIS — A419 Sepsis, unspecified organism: Secondary | ICD-10-CM | POA: Diagnosis present

## 2021-01-06 DIAGNOSIS — Z7189 Other specified counseling: Secondary | ICD-10-CM | POA: Diagnosis not present

## 2021-01-06 DIAGNOSIS — G9341 Metabolic encephalopathy: Secondary | ICD-10-CM | POA: Diagnosis present

## 2021-01-06 DIAGNOSIS — Z515 Encounter for palliative care: Secondary | ICD-10-CM | POA: Diagnosis not present

## 2021-01-06 DIAGNOSIS — D638 Anemia in other chronic diseases classified elsewhere: Secondary | ICD-10-CM | POA: Diagnosis present

## 2021-01-06 DIAGNOSIS — J9602 Acute respiratory failure with hypercapnia: Secondary | ICD-10-CM | POA: Diagnosis present

## 2021-01-06 DIAGNOSIS — K766 Portal hypertension: Secondary | ICD-10-CM | POA: Diagnosis present

## 2021-01-06 DIAGNOSIS — E440 Moderate protein-calorie malnutrition: Secondary | ICD-10-CM | POA: Diagnosis present

## 2021-01-06 DIAGNOSIS — R195 Other fecal abnormalities: Secondary | ICD-10-CM

## 2021-01-06 DIAGNOSIS — B954 Other streptococcus as the cause of diseases classified elsewhere: Secondary | ICD-10-CM | POA: Diagnosis present

## 2021-01-06 DIAGNOSIS — R4189 Other symptoms and signs involving cognitive functions and awareness: Secondary | ICD-10-CM | POA: Diagnosis present

## 2021-01-06 DIAGNOSIS — K6812 Psoas muscle abscess: Secondary | ICD-10-CM | POA: Diagnosis present

## 2021-01-06 DIAGNOSIS — I8511 Secondary esophageal varices with bleeding: Secondary | ICD-10-CM | POA: Diagnosis not present

## 2021-01-06 DIAGNOSIS — J9601 Acute respiratory failure with hypoxia: Secondary | ICD-10-CM | POA: Diagnosis present

## 2021-01-06 DIAGNOSIS — K922 Gastrointestinal hemorrhage, unspecified: Secondary | ICD-10-CM | POA: Diagnosis present

## 2021-01-06 DIAGNOSIS — D649 Anemia, unspecified: Secondary | ICD-10-CM | POA: Diagnosis present

## 2021-01-06 DIAGNOSIS — K644 Residual hemorrhoidal skin tags: Secondary | ICD-10-CM | POA: Diagnosis present

## 2021-01-06 DIAGNOSIS — R6520 Severe sepsis without septic shock: Secondary | ICD-10-CM | POA: Diagnosis present

## 2021-01-06 DIAGNOSIS — Z23 Encounter for immunization: Secondary | ICD-10-CM

## 2021-01-06 DIAGNOSIS — A499 Bacterial infection, unspecified: Secondary | ICD-10-CM | POA: Diagnosis not present

## 2021-01-06 DIAGNOSIS — Z79899 Other long term (current) drug therapy: Secondary | ICD-10-CM

## 2021-01-06 DIAGNOSIS — I959 Hypotension, unspecified: Secondary | ICD-10-CM | POA: Diagnosis not present

## 2021-01-06 DIAGNOSIS — B964 Proteus (mirabilis) (morganii) as the cause of diseases classified elsewhere: Secondary | ICD-10-CM | POA: Diagnosis present

## 2021-01-06 DIAGNOSIS — G8929 Other chronic pain: Secondary | ICD-10-CM | POA: Diagnosis present

## 2021-01-06 DIAGNOSIS — R4182 Altered mental status, unspecified: Secondary | ICD-10-CM | POA: Diagnosis not present

## 2021-01-06 DIAGNOSIS — I864 Gastric varices: Secondary | ICD-10-CM | POA: Diagnosis present

## 2021-01-06 DIAGNOSIS — Z79890 Hormone replacement therapy (postmenopausal): Secondary | ICD-10-CM

## 2021-01-06 DIAGNOSIS — Z87891 Personal history of nicotine dependence: Secondary | ICD-10-CM

## 2021-01-06 DIAGNOSIS — I444 Left anterior fascicular block: Secondary | ICD-10-CM | POA: Diagnosis present

## 2021-01-06 DIAGNOSIS — I10 Essential (primary) hypertension: Secondary | ICD-10-CM | POA: Diagnosis present

## 2021-01-06 DIAGNOSIS — I714 Abdominal aortic aneurysm, without rupture: Secondary | ICD-10-CM | POA: Diagnosis present

## 2021-01-06 DIAGNOSIS — B9689 Other specified bacterial agents as the cause of diseases classified elsewhere: Secondary | ICD-10-CM | POA: Diagnosis not present

## 2021-01-06 DIAGNOSIS — R0600 Dyspnea, unspecified: Secondary | ICD-10-CM | POA: Diagnosis not present

## 2021-01-06 DIAGNOSIS — Z8711 Personal history of peptic ulcer disease: Secondary | ICD-10-CM

## 2021-01-06 DIAGNOSIS — K7030 Alcoholic cirrhosis of liver without ascites: Secondary | ICD-10-CM | POA: Diagnosis not present

## 2021-01-06 DIAGNOSIS — Z66 Do not resuscitate: Secondary | ICD-10-CM | POA: Diagnosis present

## 2021-01-06 DIAGNOSIS — K259 Gastric ulcer, unspecified as acute or chronic, without hemorrhage or perforation: Secondary | ICD-10-CM | POA: Diagnosis not present

## 2021-01-06 LAB — CBC WITH DIFFERENTIAL/PLATELET
Abs Immature Granulocytes: 0 10*3/uL (ref 0.00–0.07)
Basophils Absolute: 0 10*3/uL (ref 0.0–0.1)
Basophils Relative: 0 %
Eosinophils Absolute: 0 10*3/uL (ref 0.0–0.5)
Eosinophils Relative: 0 %
HCT: 26.5 % — ABNORMAL LOW (ref 36.0–46.0)
Hemoglobin: 7.7 g/dL — ABNORMAL LOW (ref 12.0–15.0)
Lymphocytes Relative: 4 %
Lymphs Abs: 0.7 10*3/uL (ref 0.7–4.0)
MCH: 28.2 pg (ref 26.0–34.0)
MCHC: 29.1 g/dL — ABNORMAL LOW (ref 30.0–36.0)
MCV: 97.1 fL (ref 80.0–100.0)
Monocytes Absolute: 0.3 10*3/uL (ref 0.1–1.0)
Monocytes Relative: 2 %
Neutro Abs: 15.7 10*3/uL — ABNORMAL HIGH (ref 1.7–7.7)
Neutrophils Relative %: 94 %
Platelets: 498 10*3/uL — ABNORMAL HIGH (ref 150–400)
RBC: 2.73 MIL/uL — ABNORMAL LOW (ref 3.87–5.11)
RDW: 17.8 % — ABNORMAL HIGH (ref 11.5–15.5)
WBC: 16.7 10*3/uL — ABNORMAL HIGH (ref 4.0–10.5)
nRBC: 2 /100 WBC — ABNORMAL HIGH
nRBC: 2.5 % — ABNORMAL HIGH (ref 0.0–0.2)

## 2021-01-06 LAB — BLOOD CULTURE ID PANEL (REFLEXED) - BCID2
A.calcoaceticus-baumannii: NOT DETECTED
Bacteroides fragilis: NOT DETECTED
CTX-M ESBL: NOT DETECTED
Candida albicans: NOT DETECTED
Candida auris: NOT DETECTED
Candida glabrata: NOT DETECTED
Candida krusei: NOT DETECTED
Candida parapsilosis: NOT DETECTED
Candida tropicalis: NOT DETECTED
Carbapenem resist OXA 48 LIKE: NOT DETECTED
Carbapenem resistance IMP: NOT DETECTED
Carbapenem resistance KPC: NOT DETECTED
Carbapenem resistance NDM: NOT DETECTED
Carbapenem resistance VIM: NOT DETECTED
Cryptococcus neoformans/gattii: NOT DETECTED
Enterobacter cloacae complex: NOT DETECTED
Enterobacterales: DETECTED — AB
Enterococcus Faecium: DETECTED — AB
Enterococcus faecalis: DETECTED — AB
Escherichia coli: NOT DETECTED
Haemophilus influenzae: NOT DETECTED
Klebsiella aerogenes: NOT DETECTED
Klebsiella oxytoca: NOT DETECTED
Klebsiella pneumoniae: NOT DETECTED
Listeria monocytogenes: NOT DETECTED
Neisseria meningitidis: NOT DETECTED
Proteus species: DETECTED — AB
Pseudomonas aeruginosa: NOT DETECTED
Salmonella species: NOT DETECTED
Serratia marcescens: NOT DETECTED
Staphylococcus aureus (BCID): NOT DETECTED
Staphylococcus epidermidis: NOT DETECTED
Staphylococcus lugdunensis: NOT DETECTED
Staphylococcus species: NOT DETECTED
Stenotrophomonas maltophilia: NOT DETECTED
Streptococcus agalactiae: DETECTED — AB
Streptococcus pneumoniae: NOT DETECTED
Streptococcus pyogenes: NOT DETECTED
Streptococcus species: DETECTED — AB
Vancomycin resistance: DETECTED — AB

## 2021-01-06 LAB — PROTIME-INR
INR: 1.1 (ref 0.8–1.2)
INR: 1.1 (ref 0.8–1.2)
Prothrombin Time: 13.7 seconds (ref 11.4–15.2)
Prothrombin Time: 14.1 seconds (ref 11.4–15.2)

## 2021-01-06 LAB — URINALYSIS, ROUTINE W REFLEX MICROSCOPIC
Bilirubin Urine: NEGATIVE
Glucose, UA: NEGATIVE mg/dL
Hgb urine dipstick: NEGATIVE
Ketones, ur: NEGATIVE mg/dL
Nitrite: NEGATIVE
Protein, ur: 30 mg/dL — AB
Specific Gravity, Urine: 1.013 (ref 1.005–1.030)
pH: 6 (ref 5.0–8.0)

## 2021-01-06 LAB — COMPREHENSIVE METABOLIC PANEL
ALT: 57 U/L — ABNORMAL HIGH (ref 0–44)
AST: 63 U/L — ABNORMAL HIGH (ref 15–41)
Albumin: 2.3 g/dL — ABNORMAL LOW (ref 3.5–5.0)
Alkaline Phosphatase: 289 U/L — ABNORMAL HIGH (ref 38–126)
Anion gap: 10 (ref 5–15)
BUN: 26 mg/dL — ABNORMAL HIGH (ref 8–23)
CO2: 22 mmol/L (ref 22–32)
Calcium: 8.4 mg/dL — ABNORMAL LOW (ref 8.9–10.3)
Chloride: 101 mmol/L (ref 98–111)
Creatinine, Ser: 0.97 mg/dL (ref 0.44–1.00)
GFR, Estimated: 58 mL/min — ABNORMAL LOW (ref >60)
Glucose, Bld: 191 mg/dL — ABNORMAL HIGH (ref 70–99)
Potassium: 5.2 mmol/L — ABNORMAL HIGH (ref 3.5–5.1)
Sodium: 133 mmol/L — ABNORMAL LOW (ref 135–145)
Total Bilirubin: 1.1 mg/dL (ref 0.3–1.2)
Total Protein: 7.2 g/dL (ref 6.5–8.1)

## 2021-01-06 LAB — HEMOGLOBIN AND HEMATOCRIT, BLOOD
HCT: 23.4 % — ABNORMAL LOW (ref 36.0–46.0)
HCT: 23.2 % — ABNORMAL LOW (ref 36.0–46.0)
HCT: 24.4 % — ABNORMAL LOW (ref 36.0–46.0)
Hemoglobin: 6.7 g/dL — CL (ref 12.0–15.0)
Hemoglobin: 7.1 g/dL — ABNORMAL LOW (ref 12.0–15.0)
Hemoglobin: 7.7 g/dL — ABNORMAL LOW (ref 12.0–15.0)

## 2021-01-06 LAB — RESP PANEL BY RT-PCR (FLU A&B, COVID) ARPGX2
Influenza A by PCR: NEGATIVE
Influenza B by PCR: NEGATIVE
SARS Coronavirus 2 by RT PCR: NEGATIVE

## 2021-01-06 LAB — GLUCOSE, CAPILLARY: Glucose-Capillary: 81 mg/dL (ref 70–99)

## 2021-01-06 LAB — AMMONIA: Ammonia: 29 umol/L (ref 9–35)

## 2021-01-06 LAB — POC OCCULT BLOOD, ED: Fecal Occult Bld: POSITIVE — AB

## 2021-01-06 LAB — TROPONIN I (HIGH SENSITIVITY)
Troponin I (High Sensitivity): 11 ng/L (ref <18)
Troponin I (High Sensitivity): 12 ng/L (ref <18)

## 2021-01-06 LAB — TSH: TSH: 15.113 u[IU]/mL — ABNORMAL HIGH (ref 0.350–4.500)

## 2021-01-06 LAB — PREPARE RBC (CROSSMATCH)

## 2021-01-06 LAB — LACTIC ACID, PLASMA
Lactic Acid, Venous: 2.2 mmol/L (ref 0.5–1.9)
Lactic Acid, Venous: 1.3 mmol/L (ref 0.5–1.9)

## 2021-01-06 LAB — APTT: aPTT: 29 seconds (ref 24–36)

## 2021-01-06 MED ORDER — METRONIDAZOLE IN NACL 5-0.79 MG/ML-% IV SOLN
500.0000 mg | Freq: Once | INTRAVENOUS | Status: AC
  Administered 2021-01-06: 500 mg via INTRAVENOUS
  Filled 2021-01-06: qty 100

## 2021-01-06 MED ORDER — LACTULOSE 10 GM/15ML PO SOLN: 20.0000 g | Freq: Two times a day (BID) | ORAL | Status: DC

## 2021-01-06 MED ORDER — LEVOTHYROXINE SODIUM 25 MCG PO TABS
125.0000 ug | ORAL_TABLET | Freq: Every day | ORAL | Status: DC
  Administered 2021-01-07 – 2021-01-10 (×4): 125 ug
  Filled 2021-01-06 (×4): qty 1

## 2021-01-06 MED ORDER — LACTATED RINGERS IV BOLUS
1000.0000 mL | Freq: Once | INTRAVENOUS | Status: AC
  Administered 2021-01-06: 1000 mL via INTRAVENOUS

## 2021-01-06 MED ORDER — THIAMINE HCL 100 MG PO TABS
100.0000 mg | ORAL_TABLET | Freq: Every day | ORAL | Status: DC
  Administered 2021-01-06 – 2021-01-09 (×4): 100 mg
  Filled 2021-01-06 (×4): qty 1

## 2021-01-06 MED ORDER — SODIUM CHLORIDE 0.9 % IV SOLN
2.0000 g | Freq: Once | INTRAVENOUS | Status: AC
  Administered 2021-01-06: 2 g via INTRAVENOUS
  Filled 2021-01-06: qty 2

## 2021-01-06 MED ORDER — PANTOPRAZOLE SODIUM 40 MG IV SOLR: 40.0000 mg | Freq: Two times a day (BID) | INTRAVENOUS | Status: DC

## 2021-01-06 MED ORDER — SODIUM CHLORIDE 0.9 % IV SOLN: 80.0000 mg | Freq: Once | INTRAVENOUS | Status: DC

## 2021-01-06 MED ORDER — VANCOMYCIN HCL 1250 MG/250ML IV SOLN: 1250.0000 mg | INTRAVENOUS | Status: DC

## 2021-01-06 MED ORDER — SODIUM CHLORIDE 0.9 % IV SOLN: 8.0000 mg/h | INTRAVENOUS | Status: DC

## 2021-01-06 MED ORDER — SODIUM CHLORIDE 0.9 % IV SOLN: INTRAVENOUS | Status: DC

## 2021-01-06 MED ORDER — LACTATED RINGERS IV SOLN: INTRAVENOUS | Status: DC

## 2021-01-06 MED ORDER — MORPHINE SULFATE 10 MG/5ML PO SOLN
10.0000 mg | ORAL | Status: DC | PRN
  Filled 2021-01-06: qty 6

## 2021-01-06 MED ORDER — PANTOPRAZOLE SODIUM 40 MG IV SOLR
40.0000 mg | Freq: Two times a day (BID) | INTRAVENOUS | Status: DC
  Administered 2021-01-06 – 2021-01-07 (×4): 40 mg via INTRAVENOUS
  Filled 2021-01-06 (×5): qty 40

## 2021-01-06 MED ORDER — ALBUTEROL SULFATE (2.5 MG/3ML) 0.083% IN NEBU
2.5000 mg | INHALATION_SOLUTION | Freq: Four times a day (QID) | RESPIRATORY_TRACT | Status: DC | PRN
  Filled 2021-01-06: qty 3

## 2021-01-06 MED ORDER — PIPERACILLIN-TAZOBACTAM 3.375 G IVPB
3.3750 g | Freq: Three times a day (TID) | INTRAVENOUS | Status: DC
  Administered 2021-01-06 (×2): 3.375 g via INTRAVENOUS
  Filled 2021-01-06 (×2): qty 50

## 2021-01-06 MED ORDER — PIPERACILLIN-TAZOBACTAM 3.375 G IVPB 30 MIN
3.3750 g | Freq: Once | INTRAVENOUS | Status: AC
  Administered 2021-01-06: 3.375 g via INTRAVENOUS
  Filled 2021-01-06: qty 50

## 2021-01-06 MED ORDER — SODIUM CHLORIDE 0.9 % IV SOLN
560.0000 mg | Freq: Every day | INTRAVENOUS | Status: DC
  Administered 2021-01-06 – 2021-01-08 (×3): 560 mg via INTRAVENOUS
  Filled 2021-01-06 (×4): qty 11.2

## 2021-01-06 MED ORDER — VANCOMYCIN HCL 1000 MG/200ML IV SOLN: 1000.0000 mg | Freq: Once | INTRAVENOUS | Status: DC

## 2021-01-06 MED ORDER — FREE WATER
200.0000 mL | Freq: Four times a day (QID) | Status: DC
  Administered 2021-01-06 – 2021-01-12 (×22): 200 mL

## 2021-01-06 MED ORDER — SODIUM CHLORIDE 0.9 % IV SOLN
2.0000 g | INTRAVENOUS | Status: DC
  Administered 2021-01-06: 2 g via INTRAVENOUS
  Filled 2021-01-06: qty 20

## 2021-01-06 MED ORDER — SODIUM CHLORIDE 0.9% IV SOLUTION: Freq: Once | INTRAVENOUS | Status: AC

## 2021-01-06 MED ORDER — VANCOMYCIN HCL 1500 MG/300ML IV SOLN
1500.0000 mg | Freq: Once | INTRAVENOUS | Status: AC
  Administered 2021-01-06: 1500 mg via INTRAVENOUS
  Filled 2021-01-06: qty 300

## 2021-01-06 MED ORDER — SODIUM CHLORIDE 0.9 % IV SOLN
50.0000 ug/h | INTRAVENOUS | Status: DC
  Filled 2021-01-06: qty 1

## 2021-01-06 MED ORDER — PIPERACILLIN-TAZOBACTAM 3.375 G IVPB 30 MIN: 3.3750 g | Freq: Three times a day (TID) | INTRAVENOUS | Status: DC

## 2021-01-06 MED ORDER — NYSTATIN 100000 UNIT/GM EX OINT
1.0000 "application " | TOPICAL_OINTMENT | Freq: Two times a day (BID) | CUTANEOUS | Status: DC
  Administered 2021-01-07 – 2021-01-12 (×11): 1 via TOPICAL
  Filled 2021-01-06: qty 15

## 2021-01-06 MED ORDER — PNEUMOCOCCAL VAC POLYVALENT 25 MCG/0.5ML IJ INJ
0.5000 mL | INJECTION | INTRAMUSCULAR | Status: AC
  Administered 2021-01-07: 0.5 mL via INTRAMUSCULAR
  Filled 2021-01-06: qty 0.5

## 2021-01-06 MED ORDER — LACTULOSE 10 GM/15ML PO SOLN
20.0000 g | Freq: Two times a day (BID) | ORAL | Status: DC
  Administered 2021-01-07 – 2021-01-09 (×6): 20 g
  Filled 2021-01-06 (×6): qty 30

## 2021-01-06 MED ORDER — LACTATED RINGERS IV BOLUS
500.0000 mL | Freq: Once | INTRAVENOUS | Status: AC
  Administered 2021-01-06: 500 mL via INTRAVENOUS

## 2021-01-06 MED ORDER — RIFAXIMIN 550 MG PO TABS
550.0000 mg | ORAL_TABLET | Freq: Two times a day (BID) | ORAL | Status: DC
  Administered 2021-01-06 – 2021-01-12 (×13): 550 mg
  Filled 2021-01-06 (×15): qty 1

## 2021-01-06 NOTE — Progress Notes (Signed)
elink monitoring sepsis 

## 2021-01-06 NOTE — H&P (Addendum)
History and Physical    Kaylee King PZW:[id number] DOB: 07/07/1941 DOA: 01/06/2021  Referring MD/NP/PA: Marily Memos, MD PCP: Fleet Contras, MD  Patient coming from: [facility] via EMS  Chief Complaint: Altered  I have personally briefly reviewed patient's old medical records in [facility]   HPI: Kaylee King is a 80 y.o. 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  Patient unable to provide any significant history at this time and only tells me to stop trying to examine the patient.  Daughter notes that recently when she has visited her at [facility] patient is curled up on her side it seems due to pain.  Whenever her pain has been adequately treated she will sit up on the side of bed and is able to answer questions.  Recently the patient has had what appeared to be dried blood on her lips, but daughter had not been told of any episodes of vomiting.   Patient was found by EMS to be tachycardic and diaphoretic with O2 saturations around 85% on room air.  She was placed on nonrebreather at 10 L with improvement of O2 saturations to 93%.   Recently hospitalized from 1/20-2/15 after presenting with hemorrhagic shock due to a variceal bleed requiring 6 units PRBCs and 2 units of FFP.  EGD by GI on 1/20 showed esophageal varices which were banded and several nonbleeding gastric ulcers.  Patient was intubated for airway protection, and diagnosed with pneumonia treated with 7 days of Rocephin IV.  Patient was able to be weaned off the ventilator to 2 L of nasal cannula oxygen, but required PEG tube placement for nutrition.  Urine cultures from that hospitalization grew out E. coli and Morganella.  E. coli was sensitive to Rocephin, but sensitivities were not reported for Rocephin with Morganella.  She was discharged to [facility].  ED  Course: Upon admission into the emergency department patient was noted to be afebrile with respirations 19-25, blood pressure 91/47-116/45, and O2 saturations currently maintained on 4 L of nasal cannula oxygen.  Labs were significant for WBC 16.7, hemoglobin 7.7, platelets 498, sodium 133, potassium 5.2, BUN 26, creatinine 0.97, alkaline phosphatase 289, albumin 2.3, AST 63, ALT 57, and lactic acid 2.2.  COVID-19 screening were negative.  Chest x-ray was limited due to low lung volumes and noted probable atelectasis with improved aeration.  Urinalysis was positive for moderate leukocytes, rare bacteria, and 21-50 WBCs.  Blood cultures were obtained.  Patient received full fluid bolus with metronidazole, vancomycin, and cefepime.  TRH called to admit.  Review of Systems  Unable to perform ROS: Mental status change    Past Medical History:  Diagnosis Date  . Cirrhosis (HCC)   . Thyroid disease    hypothyroid     Past Surgical History:  Procedure Laterality Date  . ESOPHAGEAL BANDING  11/23/2020   Procedure: ESOPHAGEAL BANDING;  Surgeon: Beverley Fiedler, MD;  Location: [facility] ENDOSCOPY;  Service: Gastroenterology;;  . ESOPHAGOGASTRODUODENOSCOPY N/A 11/23/2020   Procedure: ESOPHAGOGASTRODUODENOSCOPY (EGD);  Surgeon: Beverley Fiedler, MD;  Location: [facility] ENDOSCOPY;  Service: Gastroenterology;  Laterality: N/A;  . IR GASTROSTOMY TUBE MOD SED  12/18/2020  . RADIOLOGY WITH ANESTHESIA N/A 12/12/2020   Procedure: MRI WITH ANESTHESIA;  Surgeon: Radiologist, Medication, MD;  Location: [facility] OR;  Service: Radiology;  Laterality: N/A;     reports that she quit smoking about 39  years ago. She has never used smokeless tobacco. She reports that she does not drink alcohol and does not use drugs.  No Known Allergies  No family history on file.  Prior to Admission medications   Medication Sig Start Date End Date Taking? Authorizing Provider  acetaminophen (TYLENOL) 325 MG tablet Place 2 tablets (650 mg total) into feeding  tube every 6 (six) hours as needed for headache, fever or mild pain. 12/19/20  Yes Danford, Earl Lites, MD  amLODipine (NORVASC) 5 MG tablet Place 1 tablet (5 mg total) into feeding tube daily. 12/20/20  Yes Danford, Earl Lites, MD  lactulose (CHRONULAC) 10 GM/15ML solution Place 30 mLs (20 g total) into feeding tube 2 (two) times daily. 12/19/20  Yes Danford, Earl Lites, MD  levothyroxine (SYNTHROID) 125 MCG tablet Place 125 mcg into feeding tube daily before breakfast.   Yes [provider]  morphine 20 MG/5ML solution Take 2.5 mLs (10 mg total) by mouth every 4 (four) hours as needed for pain. Patient taking differently: Place 10 mg into feeding tube every 4 (four) hours as needed for pain. 12/19/20  Yes Danford, Earl Lites, MD  nystatin ointment (MYCOSTATIN) Apply 1 application topically in the morning and at bedtime. Apply to affected area(s) on groin   Yes [provider]  pantoprazole (PROTONIX) 40 MG tablet Take 40 mg by mouth daily. Via tube   Yes [provider]  rifaximin (XIFAXAN) 550 MG TABS tablet Place 1 tablet (550 mg total) into feeding tube 2 (two) times daily. 12/19/20  Yes Danford, Earl Lites, MD  spironolactone (ALDACTONE) 25 MG tablet Place 1 tablet (25 mg total) into feeding tube daily. 12/20/20  Yes Danford, Earl Lites, MD  thiamine 100 MG tablet Place 1 tablet (100 mg total) into feeding tube daily. 12/19/20  Yes Danford, Earl Lites, MD  Water For Irrigation, Sterile (FREE WATER) SOLN Place 200 mLs into feeding tube every 6 (six) hours. 12/19/20  Yes Danford, Earl Lites, MD  levothyroxine (SYNTHROID) 75 MCG tablet Place 1.5 tablets (112.5 mcg total) into feeding tube daily at 6 (six) AM. Patient not taking: No sig reported 12/20/20   Danford, Earl Lites, MD  Nutritional Supplements (FEEDING SUPPLEMENT, OSMOLITE 1.2 CAL,) LIQD Place 1,000 mLs into feeding tube continuous. Patient not taking: Reported on 01/06/2021 12/19/20   Alberteen Sam, MD  Nutritional Supplements (FEEDING SUPPLEMENT, PROSOURCE TF,) liquid Place 45 mLs into feeding tube daily. Patient not taking: Reported on 01/06/2021 12/19/20   Alberteen Sam, MD    Physical Exam:  Constitutional: Lethargic curled up in a ball.  Not really following commands at this point in time. Vitals:   01/06/21 0630 01/06/21 0645 01/06/21 0715 01/06/21 0721  BP: (!) 111/56 (!) 102/49  (!) 111/42  Pulse: 73 79 79 78  Resp: 19 (!) 22 (!) 30 (!) 22  Temp:      TempSrc:      SpO2: 98% 100% 96% 97%   Eyes: PERRL, lids and conjunctivae normal ENMT: Mucous membranes are dry. Crack lips with dried blood present. Neck: normal, supple, no masses, no thyromegaly Respiratory: Mildly tachypneic with decreased aeration.  Patient currently on 2 L nasal cannula oxygen with O2 saturation maintained. Cardiovascular: Regular rate and rhythm, no murmurs / rubs / gallops. No extremity edema. 2+ pedal pulses. No carotid bruits.  Abdomen: no tenderness, no masses palpated.  No fluid wave appreciated. Bowel sounds positive.  G-tube in place. Rectal exam: External hemorrhoids appreciated and light brownish stool  appreciated that was guaiac positive Musculoskeletal: no clubbing / cyanosis. No joint deformity upper and lower extremities. Good ROM, no contractures. Normal muscle tone.  Skin: Erythema noted of the sacrum and inguinal region. Neurologic: CN 2-12 grossly intact.  Able to move all extremities. Psychiatric: Lethargic, but currently not answering questions at this    Labs on Admission: I have personally reviewed following labs and imaging studies  CBC: Recent Labs  Lab 01/06/21 0332  WBC 16.7*  NEUTROABS 15.7*  HGB 7.7*  HCT 26.5*  MCV 97.1  PLT 498*   Basic Metabolic Panel: Recent Labs  Lab 01/06/21 0332  NA 133*  K 5.2*  CL 101  CO2 22  GLUCOSE 191*  BUN 26*  CREATININE 0.97  CALCIUM 8.4*   GFR: CrCl cannot be calculated (Unknown ideal  weight.). Liver Function Tests: Recent Labs  Lab 01/06/21 0332  AST 63*  ALT 57*  ALKPHOS 289*  BILITOT 1.1  PROT 7.2  ALBUMIN 2.3*   No results for input(s): LIPASE, AMYLASE in the last 168 hours. Recent Labs  Lab 01/06/21 0436  AMMONIA 29   Coagulation Profile: Recent Labs  Lab 01/06/21 0332 01/06/21 0503  INR 1.1 1.1   Cardiac Enzymes: No results for input(s): CKTOTAL, CKMB, CKMBINDEX, TROPONINI in the last 168 hours. BNP (last 3 results) No results for input(s): PROBNP in the last 8760 hours. HbA1C: No results for input(s): HGBA1C in the last 72 hours. CBG: No results for input(s): GLUCAP in the last 168 hours. Lipid Profile: No results for input(s): CHOL, HDL, LDLCALC, TRIG, CHOLHDL, LDLDIRECT in the last 72 hours. Thyroid Function Tests: Recent Labs    01/06/21 0436  TSH 15.113*   Anemia Panel: No results for input(s): VITAMINB12, FOLATE, FERRITIN, TIBC, IRON, RETICCTPCT in the last 72 hours. Urine analysis:    Component Value Date/Time   COLORURINE YELLOW 01/06/2021 0621   APPEARANCEUR HAZY (A) 01/06/2021 0621   LABSPEC 1.013 01/06/2021 0621   PHURINE 6.0 01/06/2021 0621   GLUCOSEU NEGATIVE 01/06/2021 0621   HGBUR NEGATIVE 01/06/2021 0621   BILIRUBINUR NEGATIVE 01/06/2021 0621   KETONESUR NEGATIVE 01/06/2021 0621   PROTEINUR 30 (A) 01/06/2021 0621   NITRITE NEGATIVE 01/06/2021 0621   LEUKOCYTESUR MODERATE (A) 01/06/2021 0621   Sepsis Labs: Recent Results (from the past 240 hour(s))  Resp Panel by RT-PCR (Flu A&B, Covid) Nasopharyngeal Swab     Status: None   Collection Time: 01/06/21  4:37 AM   Specimen: Nasopharyngeal Swab; Nasopharyngeal(NP) swabs in vial transport medium  Result Value Ref Range Status   SARS Coronavirus 2 by RT PCR NEGATIVE NEGATIVE Final    Comment: (NOTE) SARS-CoV-2 target nucleic acids are NOT DETECTED.  The SARS-CoV-2 RNA is generally detectable in upper respiratory specimens during the acute phase of infection. The  lowest concentration of SARS-CoV-2 viral copies this assay can detect is 138 copies/mL. A negative result does not preclude SARS-Cov-2 infection and should not be used as the sole basis for treatment or other patient management decisions. A negative result may occur with  improper specimen collection/handling, submission of specimen other than nasopharyngeal swab, presence of viral mutation(s) within the areas targeted by this assay, and inadequate number of viral copies(<138 copies/mL). A negative result must be combined with clinical observations, patient history, and epidemiological information. The expected result is Negative.  Fact Sheet for Patients:  BloggerCourse.com  Fact Sheet for Healthcare Providers:  SeriousBroker.it  This test is no t yet approved or cleared by the [place] FDA  and  has been authorized for detection and/or diagnosis of SARS-CoV-2 by FDA under an Emergency Use Authorization (EUA). This EUA will remain  in effect (meaning this test can be used) for the duration of the COVID-19 declaration under Section 564(b)(1) of the Act, 21 U.S.C.section 360bbb-3(b)(1), unless the authorization is terminated  or revoked sooner.       Influenza A by PCR NEGATIVE NEGATIVE Final   Influenza B by PCR NEGATIVE NEGATIVE Final    Comment: (NOTE) The Xpert Xpress SARS-CoV-2/FLU/RSV plus assay is intended as an aid in the diagnosis of influenza from Nasopharyngeal swab specimens and should not be used as a sole basis for treatment. Nasal washings and aspirates are unacceptable for Xpert Xpress SARS-CoV-2/FLU/RSV testing.  Fact Sheet for Patients: BloggerCourse.com  Fact Sheet for Healthcare Providers: SeriousBroker.it  This test is not yet approved or cleared by the [place] FDA and has been authorized for detection and/or diagnosis of SARS-CoV-2 by FDA under  an Emergency Use Authorization (EUA). This EUA will remain in effect (meaning this test can be used) for the duration of the COVID-19 declaration under Section 564(b)(1) of the Act, 21 U.S.C. section 360bbb-3(b)(1), unless the authorization is terminated or revoked.  Performed at [facility], [address] [place], [place] [address]      Radiological Exams on Admission: DG Chest Port 1 View  Result Date: 01/06/2021 CLINICAL DATA:  Questionable sepsis EXAM: PORTABLE CHEST 1 VIEW COMPARISON:  12/10/2020 FINDINGS: Low volume chest although better than before. Streaky/Reticular densities at the bases which are also improved. Unremarkable heart size and mediastinal contours when allowing for marked distortion from rotation. No visible effusion or pneumothorax. IMPRESSION: 1. Limited low volume chest. 2. Probable atelectasis at the bases. Aeration is improved from the most recent comparison in February. Electronically Signed   By: Marnee Spring M.D.   On: 01/06/2021 04:44    EKG: Independently reviewed.  Sinus rhythm with first 84 bpm  Assessment/Plan Sepsis secondary to suspect UTI: Patient presented with tachypnea with WBC elevated at 16.7 and initial lactic acid elevated at 2.2.  Chest x-ray limited due to low lung volumes but appear to show improved aeration.  Urinalysis was still positive for moderate leukocytes rare bacteria, and 21-50 WBCs.  During last hospitalization patient was positive for > 100,000 E. coli and Morganelli, but had received Rocephin for treatment of a suspected community-acquired pneumonia.  E. coli was sensitive to the Rocephin, but Morganelli had no sensitivities reported for Rocephin and was resistant to cefazolin.  Sepsis protocol had been initiated today due to presenting symptoms with full fluid bolus, vancomycin, metronidazole, and Zosyn. -Admit to a progressive bed -Follow-up blood and urine culture -N.p.o. for possible need of procedure -Continue  vancomycin and started Zosyn -Trend lactic acid levels -Acetaminophen as needed for fever  Hypotension: Acute.  On admission blood pressures noted to be intermittently as low as 94/42.  Unclear at this time if symptoms are secondary to dehydration or possible bleed.  Blood pressures improved after patient received approximately 3 L of normal saline IV fluids.  RN noted that they had been restricting how much fluid she was getting at the facility. -Hold amlodipine  -Goal MAP 65 -Continue IV fluids at 100 mL/h   Normocytic anemia Possible GI bleed with history of bleeding esophageal/gastric varices: Acute on chronic.  Hemoglobin 7.7 g/dL which appears around patient's baseline upon discharge.  However suspect hemoconcentration given elevated BUN to creatinine ratio to suggest dehydration suspect hemoglobin  could be lower.  Patient was typed and screened for possible need of blood products. Question possibility of recurrent bleed. -Check stool guaiac (which revealed maroon-colored stool that was guaiac positive) -Serial monitoring of H&H -Will transfuse blood products if hemoglobin drops below 7  -Protonix 40 mg IV  -GI consulted, but after evaluation symptoms less likely representing acute GI hemorrhage  Respiratory failure with hypoxia: Acute. Initially O2 saturations 85% on room air.  Patient was placed on a nonrebreather with improvement in O2 saturations.  Chest x-ray showed low lung volumes, but increased aeration from previous exams with no clear focal infiltrate -Continuous pulse oximetry with nasal cannula oxygen maintain O2 saturation greater than 92%  -Incentive spirometry   Cirrhosis with portal hypertension and history of varices: Patient just recently had prolonged hospitalization after presenting with a variceal bleed.   She underwent EGD on 1/20 status post banding of  6 esophageal varices with noted of nonbleeding gastric ulcers. -Continue rifaximin  -Restart lactulose in a.m. if  no significant concern for bleed  Acute metabolic encephalopathy:  Patient was noted to be acutely altered at the facility.  Level was noted to be within normal limits.  Work-up concerning for possible infection and dehydration.  Her daughter states that she will curl up in a ball and less pain treated.  No clear focal deficits appreciated at this time. -Neurochecks  Hypothyroidism: Chronic.  TSH 15.113 on recheck today, and appears improved from 20.643 on 2/8.  Patient had been discharged home on levothyroxine 112.5 mcg daily, but had recently been increased to 125 mcg daily. -Continue levothyroxine 125 mcg daily per tube -Recheck TSH in outpatient setting in 4 to 6 weeks  Dysphagia s/p PEG: Patient reportedly has not been able to safely swallow yet. -Speech therapy consulted to evaluate and treat  Chronic pain: Patient had been placed on morphine 10 mg per tube every 4 hours for moderate pain.  Daughter notes that when patient's pain is adequately controlled she is able to sit up and at least try and communicate. 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  Alkaline phosphatase 289, ALT 57, AST 63.  -Continue to monitor  Suspect inguinal yeast infection: On physical exam there is erythema of the inguinal region without signs of open wound -Continue nystatin ointment  Hypoalbuminemia: Acute on chronic.  Albumin noted to be 2.3 on admission. -Nutrition consult for tube feed recommendations  DVT prophylaxis: SCDs Code Status: Full Family Communication: Daughter updated over the phone Disposition Plan: Likely discharge back to skilled nursing facility once Consults called: [facility] Admission status: Inpatient, require more than 2 midnight stay due to concern for sepsis  Kaylee Braunondell A Babara [place]  MD [facility]   If 7PM-7AM, please contact night-coverage   01/06/2021, 7:39 AM

## 2021-01-06 NOTE — ED Notes (Signed)
Pt repositioned in bed, given pillow and warm blanket.

## 2021-01-06 NOTE — Progress Notes (Signed)
Pharmacy Antibiotic Note  Kaylee King is a 80 y.o. female admitted on 01/06/2021 with sepsis.  Pharmacy has been consulted for vancomycin dosing.  WBC 16.7, SCr wnl   Plan: -Vancomycin 1500 mg IV load followed by Vancomycin 1250 mg IV Q 48 hrs. Goal AUC 400-550. Expected AUC: 476 SCr used: 0.97 -Zosyn 3.375 gm IV Q 8 hours (EI infusion)  -Monitor CBC, renal fx, cultures and clinical progress    Temp (24hrs), Avg:98.4 F (36.9 C), Min:98.4 F (36.9 C), Max:98.4 F (36.9 C)  Recent Labs  Lab 01/06/21 0332 01/06/21 0941  WBC 16.7*  --   CREATININE 0.97  --   LATICACIDVEN 2.2* 1.3    CrCl cannot be calculated (Unknown ideal weight.).    No Known Allergies  Antimicrobials this admission: Vancomycin 3/5 >>  Zosyn 3/5 >>   Dose adjustments this admission:  Microbiology results: 3/5 BCx:  3/5 UCx:     Thank you for allowing pharmacy to be a part of this patient's care.  Vinnie Level, PharmD., BCPS, BCCCP Clinical Pharmacist Please refer to [facility] for unit-specific pharmacist

## 2021-01-06 NOTE — ED Notes (Signed)
Pts brief changed  

## 2021-01-06 NOTE — Consult Note (Addendum)
Referring Provider: Dr. Fuller Plan  Primary Care Physician:  Nolene Ebbs, MD Primary Gastroenterologist:  Pyrtle   Reason for Consultation: Hematemesis  HPI: Kaylee King is a 80 y.o. 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  She developed altered mental status with acute shortness of breath and she was transferred from [place] [facility] to [facility] ED this morning for further evaluation.  Patient was tachycardic and diaphoretic per EMS, oxygen saturations were 85% on room air and she was placed on a nonrebreather 10 L. Labs in the ED showed a sodium level 133.  Potassium 5.2.  Glucose 191.  BUN 26.  Creatinine 0.97.  Alk phos 289.  AST 63.  ALT 57.  Total bili 1.1.  Albumin 2.3.  Ammonia 29.  Lactic acid 2.2.  Troponin 11.  WBC 16.7.  Hemoglobin 7.7 ( Hg 7.8 on 12/18/2020).  Hematocrit 26.5.  MCV 97.1.  Platelet 490.  INR 1.1.  TSH 15.113.  SARS coronavirus 2 negative.  Influenza AMB negative.  Urine bili negative.  Urine leukocyte moderate.  FOBT positive.  Currently, she is awake and conversant but confused.  She is restless in the ER stretcher.  She has dry blood around her lips.  The ED RN verified the patient had blood from her dry lips, no hematemesis.  The patient is unable to provide any past medical history or current.  She denies having any chest pain cough or shortness of breath.  No abdominal pain.  She passed a large amount of soft golden brown stool as reported by her RN.  I completed a rectal exam at the bedside which showed external hemorrhoids, no blood/melena or stool in the rectal vault.  She is on 2 L nasal cannula with oxygen saturations 94%.  She is hemodynamically stable.    In review of her epic  records, she was admitted to the hospital 1/20 -12/19/2020 with a upper GI bleed/hematemesis and encephalopathy.  She was placed on Octreotide, PPI infusion and received a total of 6 units of PRBCs.  She underwent an EGD by Dr. Hilarie Fredrickson 11/23/2020 which identified grade 3 esophageal varices (felt to be source of bleeding). The esophageal varices were banded x 6 and type I gastroesophageal varices 9GOV1), esophageal varices which extend along the lesser curvature) about active bleeding, nonbleeding gastric ulcers x 3.  Biopsies were not obtained.  She developed acute respiratory failure during her hospital admission due to COPD and pneumonia.  She was intubated and treated with ceftriaxone for 7 days.  She was also treated for COPD with acute pneumonia on ceftriaxone for 7 days.  She was extubated but required BiPAP for 24 hours.  She required a placement of a G-tube for feedings 2/14.  Overall, her clinical status improved and she was discharged to the SNF on 12/19/2020.   EGD 11/23/2020 by Dr. Hilarie Fredrickson at [facility]: - Grade III esophageal varices. Most likely source of today's GI bleeding. Esophageal varices banded x 6. - Clotted blood in the gastric fundus, removed as above. - Type 1 gastroesophageal varices (GOV1, esophageal varices which extend along the lesser curvature), without bleeding. - Non-bleeding gastric ulcers x 3 with no stigmata of bleeding. - Normal examined duodenum. - No specimens collected. - Images not obtained via [facility], but will be added  to [facility] for reference.  Gastrostomy tube per IR 12/18/2020: Status post fluoroscopic placed percutaneous gastrostomy tube, with 20 French pull-through  CTAP 12/16/2020 prior to G-tube placement: 1. Anatomy is suitable for percutaneous gastrostomy tube placement. 2. Relative expansion and central low attenuation of the right psoas muscle relative to the left. Differential considerations include intro psoas hematoma versus abscess. 3.  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 Recommend follow-up every 3 years. This recommendation follows ACR consensus guidelines: White Paper of the ACR Incidental Findings Committee II on Vascular Findings. J Am Coll Radiol 2013; 10:789-794. Aortic Atherosclerosis (ICD10-I70.0); Aortic aneurysm NOS (ICD10-I71.9). 4. Multifocal patchy airspace opacities in the left greater than right lower lobe concerning for pneumonia versus aspiration. 5. Enlarged main pulmonary artery suggests pulmonary arterial hypertension. 6. Small nonobstructing nephrolithiasis bilaterally. 7. Chronic appearing T12 and T10 compression fractures.  Past Medical History:  Diagnosis Date  . Cirrhosis ([place])   . Thyroid disease    hypothyroid     Past Surgical History:  Procedure Laterality Date  . ESOPHAGEAL BANDING  11/23/2020   Procedure: ESOPHAGEAL BANDING;  Surgeon: Jerene Bears, MD;  Location: [facility] ENDOSCOPY;  Service: Gastroenterology;;  . ESOPHAGOGASTRODUODENOSCOPY N/A 11/23/2020   Procedure: ESOPHAGOGASTRODUODENOSCOPY (EGD);  Surgeon: Jerene Bears, MD;  Location[facility] ENDOSCOPY;  Service: Gastroenterology;  Laterality: N/A;  . IR GASTROSTOMY TUBE MOD SED  12/18/2020  . RADIOLOGY WITH ANESTHESIA N/A 12/12/2020   Procedure: MRI WITH ANESTHESIA;  Surgeon: Radiologist, Medication, MD;  Location: [place];  Service: Radiology;  Laterality: N/A;    Prior to Admission medications   Medication Sig Start Date End Date Taking? Authorizing Provider  acetaminophen (TYLENOL) 325 MG tablet Place 2 tablets (650 mg total) into feeding tube every 6 (six) hours as needed for headache, fever or mild pain. 12/19/20  Yes Danford, Suann Larry, MD  amLODipine (NORVASC) 5 MG tablet Place 1 tablet (5 mg total) into feeding tube daily. 12/20/20  Yes Danford, Suann Larry, MD  lactulose (CHRONULAC) 10 GM/15ML solution Place 30 mLs (20 g total) into feeding tube 2 (two) times daily. 12/19/20  Yes Danford,  Suann Larry, MD  levothyroxine (SYNTHROID) 125 MCG tablet Place 125 mcg into feeding tube daily before breakfast.   Yes [provider]  morphine 20 MG/5ML solution Take 2.5 mLs (10 mg total) by mouth every 4 (four) hours as needed for pain. Patient taking differently: Place 10 mg into feeding tube every 4 (four) hours as needed for pain. 12/19/20  Yes Danford, Suann Larry, MD  nystatin ointment (MYCOSTATIN) Apply 1 application topically in the morning and at bedtime. Apply to affected area(s) on groin   Yes [provider]  pantoprazole (PROTONIX) 40 MG tablet Take 40 mg by mouth daily. Via tube   Yes [provider]  rifaximin (XIFAXAN) 550 MG TABS tablet Place 1 tablet (550 mg total) into feeding tube 2 (two) times daily. 12/19/20  Yes Danford, Suann Larry, MD  spironolactone (ALDACTONE) 25 MG tablet Place 1 tablet (25 mg total) into feeding tube daily. 12/20/20  Yes Danford, Suann Larry, MD  thiamine 100 MG tablet Place 1 tablet (100 mg total) into feeding tube daily. 12/19/20  Yes Danford, Suann Larry, MD  Water For Irrigation, Sterile (FREE WATER) SOLN Place 200 mLs into feeding tube every 6 (six) hours. 12/19/20  Yes Danford, Suann Larry, MD  levothyroxine (SYNTHROID) 75 MCG tablet Place 1.5 tablets (112.5 mcg total) into feeding tube daily at 6 (six) AM. Patient  not taking: No sig reported 12/20/20   Edwin Dada, MD  Nutritional Supplements (FEEDING SUPPLEMENT, OSMOLITE 1.2 CAL,) LIQD Place 1,000 mLs into feeding tube continuous. Patient not taking: Reported on 01/06/2021 12/19/20   Edwin Dada, MD  Nutritional Supplements (FEEDING SUPPLEMENT, PROSOURCE TF,) liquid Place 45 mLs into feeding tube daily. Patient not taking: Reported on 01/06/2021 12/19/20   Edwin Dada, MD    Current Facility-Administered Medications  Medication Dose Route Frequency Provider Last Rate Last Admin  . albuterol (PROVENTIL) (2.5 MG/3ML) 0.083%  nebulizer solution 2.5 mg  2.5 mg Nebulization Q6H PRN Tamala Julian, Rondell A, MD      . lactated ringers infusion   Intravenous Continuous Fuller Plan A, MD 250 mL/hr at 01/06/21 0710 Rate Change at 01/06/21 0710  . [START ON 01/07/2021] levothyroxine (SYNTHROID) tablet 125 mcg  125 mcg Per Tube QAC breakfast Fuller Plan A, MD      . morphine 20 MG/5ML solution 10 mg  10 mg Per Tube Q4H PRN Tamala Julian, Rondell A, MD      . nystatin ointment (MYCOSTATIN) 1 application  1 application Topical BID Smith, Rondell A, MD      . octreotide (SANDOSTATIN) 500 mcg in sodium chloride 0.9 % 250 mL (2 mcg/mL) infusion  50 mcg/hr Intravenous Continuous Smith, Rondell A, MD      . pantoprazole (PROTONIX) injection 40 mg  40 mg Intravenous Q12H Smith, Rondell A, MD      . piperacillin-tazobactam (ZOSYN) IVPB 3.375 g  3.375 g Intravenous Q8H Smith, Rondell A, MD      . rifaximin (XIFAXAN) tablet 550 mg  550 mg Per Tube BID Smith, Rondell A, MD      . thiamine tablet 100 mg  100 mg Per Tube Daily Norval Morton, MD       Current Outpatient Medications  Medication Sig Dispense Refill  . acetaminophen (TYLENOL) 325 MG tablet Place 2 tablets (650 mg total) into feeding tube every 6 (six) hours as needed for headache, fever or mild pain.    Marland Kitchen amLODipine (NORVASC) 5 MG tablet Place 1 tablet (5 mg total) into feeding tube daily.    Marland Kitchen lactulose (CHRONULAC) 10 GM/15ML solution Place 30 mLs (20 g total) into feeding tube 2 (two) times daily. 236 mL 0  . levothyroxine (SYNTHROID) 125 MCG tablet Place 125 mcg into feeding tube daily before breakfast.    . morphine 20 MG/5ML solution Take 2.5 mLs (10 mg total) by mouth every 4 (four) hours as needed for pain. (Patient taking differently: Place 10 mg into feeding tube every 4 (four) hours as needed for pain.) 100 mL 0  . nystatin ointment (MYCOSTATIN) Apply 1 application topically in the morning and at bedtime. Apply to affected area(s) on groin    . pantoprazole (PROTONIX) 40 MG  tablet Take 40 mg by mouth daily. Via tube    . rifaximin (XIFAXAN) 550 MG TABS tablet Place 1 tablet (550 mg total) into feeding tube 2 (two) times daily. 42 tablet   . spironolactone (ALDACTONE) 25 MG tablet Place 1 tablet (25 mg total) into feeding tube daily.    Marland Kitchen thiamine 100 MG tablet Place 1 tablet (100 mg total) into feeding tube daily.    . Water For Irrigation, Sterile (FREE WATER) SOLN Place 200 mLs into feeding tube every 6 (six) hours.    Marland Kitchen levothyroxine (SYNTHROID) 75 MCG tablet Place 1.5 tablets (112.5 mcg total) into feeding tube daily at 6 (six) AM. (Patient not  taking: No sig reported)    . Nutritional Supplements (FEEDING SUPPLEMENT, OSMOLITE 1.2 CAL,) LIQD Place 1,000 mLs into feeding tube continuous. (Patient not taking: Reported on 01/06/2021)  0  . Nutritional Supplements (FEEDING SUPPLEMENT, PROSOURCE TF,) liquid Place 45 mLs into feeding tube daily. (Patient not taking: Reported on 01/06/2021)      Allergies as of 01/06/2021  . (No Known Allergies)    No family history on file.  Social History   Socioeconomic History  . Marital status: Legally Separated    Spouse name: Not on file  . Number of children: Not on file  . Years of education: Not on file  . Highest education level: Not on file  Occupational History  . Not on file  Tobacco Use  . Smoking status: Former Smoker    Quit date: 12/31/1981    Years since quitting: 39.0  . Smokeless tobacco: Never Used   Review of Systems: Unable to complete review of systems as the patient has AMS. No family at the bedside.   Physical Exam: Vital signs in last 24 hours: Temp:  [98.4 F (36.9 C)] 98.4 F (36.9 C) (03/05 0435) Pulse Rate:  [73-94] 78 (03/05 0721) Resp:  [19-30] 22 (03/05 0721) BP: (91-116)/(42-56) 111/42 (03/05 0721) SpO2:  [96 %-100 %] 97 % (03/05 0721)   General: 80 year old female restless, conversant in no acute distress. Head:  Normocephalic and atraumatic. Eyes:  No scleral icterus.  Conjunctiva pink. Ears:  Normal auditory acuity. Nose:  No deformity, discharge or lesions. Mouth: Absent dentition.  No ulcers or lesions.  Neck:  Supple. No lymphadenopathy or thyromegaly.  Lungs: Breath sounds clear, diminished in the bases. Heart: Regular rate and rhythm, no murmurs. Abdomen: Soft, nondistended.  Nontender.  Positive bowel sounds all 4 quadrants.  G-tube site intact, G-tube clamped. Rectal: Inflamed nonbleeding external hemorrhoids.  No stool or blood/melena in the rectal vault. Musculoskeletal:  Symmetrical without gross deformities.  Pulses:  Normal pulses noted. Extremities:  Without clubbing or edema. Neurologic:  Alert and  oriented x 2.  Speech is softly spoken.  She moves all extremities. Skin:  Intact without significant lesions or rashes. Psych:  Alert and cooperative.   Lab Results: Recent Labs    01/06/21 0332  WBC 16.7*  HGB 7.7*  HCT 26.5*  PLT 498*   BMET Recent Labs    01/06/21 0332  NA 133*  K 5.2*  CL 101  CO2 22  GLUCOSE 191*  BUN 26*  CREATININE 0.97  CALCIUM 8.4*   LFT Recent Labs    01/06/21 0332  PROT 7.2  ALBUMIN 2.3*  AST 63*  ALT 57*  ALKPHOS 289*  BILITOT 1.1   PT/INR Recent Labs    01/06/21 0332 01/06/21 0503  LABPROT 13.7 14.1  INR 1.1 1.1    Studies/Results: DG Chest Port 1 View  Result Date: 01/06/2021 CLINICAL DATA:  Questionable sepsis EXAM: PORTABLE CHEST 1 VIEW COMPARISON:  12/10/2020 FINDINGS: Low volume chest although better than before. Streaky/Reticular densities at the bases which are also improved. Unremarkable heart size and mediastinal contours when allowing for marked distortion from rotation. No visible effusion or pneumothorax. IMPRESSION: 1. Limited low volume chest. 2. Probable atelectasis at the bases. Aeration is improved from the most recent comparison in February. Electronically Signed   By: Monte Fantasia M.D.   On: 01/06/2021 04:44    IMPRESSION/PLAN:  82.  80 year old female  with a history of Kaylee King cirrhosis previously admitted to the hospital 11/23/2020 with  an UGI bleeding secondary to esophageal varices with anemia.  She received 6 units of PRBCs during that admission and s/p EGD with 6 bands placed 11/23/2020. She presented to the ED this morning with altered mental status.  Hemoglobin 7.7 (Hg was 7.8 at the time of her prior hospital discharge 12/19/2020).  Small amount of dried blood around her lips which the ED RN reported came from cracks in her lips and not hematemesis.  Patient passed a large amount of golden brown soft stool while in the ED.  FOBT positive.  Rectal exam was negative for blood/melena.  She is hemodynamically stable at this time. -No need for Octreotide unless patient has active hematemesis/melena -Continue to monitor H&H closely -IV fluids per the hospitalist -Pantoprazole 40 mg IV daily -No plans for EGD at this time -Hold G-tube feedings for now -Further recommendations per Dr. Carlean Purl  2.  Leukocytosis, concerning for sepsis.  She  is afebrile.  Chest x-ray without acute process.  -Recommend urine culture if not already done -Recommend Blood cultures  3.  Metabolic encephalopathy  4.  Hypothyroidism  4. Acute respiratory failure, oxygen saturations 94% on 2 L nasal cannula. Chest x-ray showed probable atelectasis at the bases.     Noralyn Pick  01/06/2021, 11:04 AM    I have seen and evaluated the patient as well and agree with the excellent work-up by Ms. Berniece Pap.  When I took the call on this patient there was some concern about GI bleeding, I think we know now that this is not a GI hemorrhage.  This looks most like sepsis.  She does not need octreotide or GI bleeding treatment and we will follow up. Agree with aggressive care empirically for sepsis.  Will order ultrasound to look for ascites which should be tapped if possible. She does not seem to have ascites on exam.   Gatha Mayer, MD, [facility]  Gastroenterology 01/06/2021 11:31 AM  [phone]

## 2021-01-06 NOTE — ED Triage Notes (Signed)
BB [facility] from [place] for sudden AMS and SOB. Previous PNA 2/15 "resolved". 85% on RA. 93% on 10L NRB mask. Diaphoretic and tachy

## 2021-01-06 NOTE — ED Notes (Signed)
Pt now awake and talking. Aware she is at the hospital and from [place]. Denies pain at this time.

## 2021-01-06 NOTE — ED Notes (Addendum)
Attempted to call Son Brett Canales [phone] Daughter @ [phone]

## 2021-01-06 NOTE — Progress Notes (Signed)
PHARMACY - PHYSICIAN COMMUNICATION CRITICAL VALUE ALERT - BLOOD CULTURE IDENTIFICATION (BCID)  Kaylee King is an 80 y.o. female who presented to [facility] on 01/06/2021 with a chief complaint of altered mental status.  Assessment:  1/4 bottles growing GPC in chains/pairs/GNR - BCID finding enterococcus faecalis/faecium w/ vanc resistance, strep agalactiae, and proteus species.  Name of physician (or Provider) Contacted: Dr Loney Loh  Current antibiotics: Vancomycin, zosyn, metronidazole x1  Changes to prescribed antibiotics recommended:  Change to daptomycin 560 mg (8 mg/kg) every 24 hours + ceftriaxone 2g IV every 24 hours. F/u ID consult.   Results for orders placed or performed during the hospital encounter of 01/06/21  Blood Culture ID Panel (Reflexed) (Collected: 01/06/2021  3:50 AM)  Result Value Ref Range   Enterococcus faecalis DETECTED (A) NOT DETECTED   Enterococcus Faecium DETECTED (A) NOT DETECTED   Listeria monocytogenes NOT DETECTED NOT DETECTED   Staphylococcus species NOT DETECTED NOT DETECTED   Staphylococcus aureus (BCID) NOT DETECTED NOT DETECTED   Staphylococcus epidermidis NOT DETECTED NOT DETECTED   Staphylococcus lugdunensis NOT DETECTED NOT DETECTED   Streptococcus species DETECTED (A) NOT DETECTED   Streptococcus agalactiae DETECTED (A) NOT DETECTED   Streptococcus pneumoniae NOT DETECTED NOT DETECTED   Streptococcus pyogenes NOT DETECTED NOT DETECTED   A.calcoaceticus-baumannii NOT DETECTED NOT DETECTED   Bacteroides fragilis NOT DETECTED NOT DETECTED   Enterobacterales DETECTED (A) NOT DETECTED   Enterobacter cloacae complex NOT DETECTED NOT DETECTED   Escherichia coli NOT DETECTED NOT DETECTED   Klebsiella aerogenes NOT DETECTED NOT DETECTED   Klebsiella oxytoca NOT DETECTED NOT DETECTED   Klebsiella pneumoniae NOT DETECTED NOT DETECTED   Proteus species DETECTED (A) NOT DETECTED   Salmonella species NOT DETECTED NOT DETECTED   Serratia marcescens NOT  DETECTED NOT DETECTED   Haemophilus influenzae NOT DETECTED NOT DETECTED   Neisseria meningitidis NOT DETECTED NOT DETECTED   Pseudomonas aeruginosa NOT DETECTED NOT DETECTED   Stenotrophomonas maltophilia NOT DETECTED NOT DETECTED   Candida albicans NOT DETECTED NOT DETECTED   Candida auris NOT DETECTED NOT DETECTED   Candida glabrata NOT DETECTED NOT DETECTED   Candida krusei NOT DETECTED NOT DETECTED   Candida parapsilosis NOT DETECTED NOT DETECTED   Candida tropicalis NOT DETECTED NOT DETECTED   Cryptococcus neoformans/gattii NOT DETECTED NOT DETECTED   CTX-M ESBL NOT DETECTED NOT DETECTED   Carbapenem resistance IMP NOT DETECTED NOT DETECTED   Carbapenem resistance KPC NOT DETECTED NOT DETECTED   Carbapenem resistance NDM NOT DETECTED NOT DETECTED   Carbapenem resist OXA 48 LIKE NOT DETECTED NOT DETECTED   Vancomycin resistance DETECTED (A) NOT DETECTED   Carbapenem resistance VIM NOT DETECTED NOT DETECTED    Sherron Monday, PharmD, BCCCP Clinical Pharmacist  Phone: [phone] 01/06/2021 8:00 PM  Please check AMION for all [facility] phone numbers After 10:00 PM, call Main Pharmacy [phone]

## 2021-01-06 NOTE — ED Notes (Signed)
Occult card at bedside 

## 2021-01-06 NOTE — Progress Notes (Signed)
Notified bedside nurse of need to draw repeat lactic acid.  Secure chat sent to assigned bedside RN asking for repeat lactic to be drawn

## 2021-01-06 NOTE — ED Notes (Addendum)
Spoke with pt's son, updated on pta arrival and care thus far.

## 2021-01-06 NOTE — ED Provider Notes (Signed)
[facility] EMERGENCY DEPARTMENT Provider Note   CSN: [id number] Arrival date & time: 01/06/21  0319     History Chief Complaint  Patient presents with  . Altered Mental Status    Kaylee King is a 80 y.o. female.  Brought by nursing facility for sudden change in mental status.  Apparently patient was here until couple weeks ago for pneumonia.  Apparently she been at baseline until now.  She was found to be hypoxic, tachycardic and diaphoretic by EMS.  She is also tachypneic.  She was brought here for further evaluation.  Patient to offer much history.no    Altered Mental Status      Past Medical History:  Diagnosis Date  . Cirrhosis (HCC)   . Thyroid disease    hypothyroid     Patient Active Problem List   Diagnosis Date Noted  . Acute hypoxemic respiratory failure (HCC)   . Respiratory failure (HCC)   . Pressure injury of skin 11/27/2020  . Acute metabolic encephalopathy 11/23/2020  . UGI bleed   . 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 Portal hypertension (HCC)   . Acute gastric ulcer without hemorrhage or perforation   . Hypothyroid 01/01/2012  . HTN (hypertension) 01/01/2012    Past Surgical History:  Procedure Laterality Date  . ESOPHAGEAL BANDING  11/23/2020   Procedure: ESOPHAGEAL BANDING;  Surgeon: Beverley Fiedler, MD;  Location: [facility] ENDOSCOPY;  Service: Gastroenterology;;  . ESOPHAGOGASTRODUODENOSCOPY N/A 11/23/2020   Procedure: ESOPHAGOGASTRODUODENOSCOPY (EGD);  Surgeon: Beverley Fiedler, MD;  Location: [facility] ENDOSCOPY;  Service: Gastroenterology;  Laterality: N/A;  . IR GASTROSTOMY TUBE MOD SED  12/18/2020  . RADIOLOGY WITH ANESTHESIA N/A 12/12/2020   Procedure: MRI WITH ANESTHESIA;  Surgeon: Radiologist, Medication, MD;  Location: [facility] OR;  Service: Radiology;  Laterality: N/A;     OB History   No obstetric history on file.     No family history on file.  Social History   Tobacco Use  . Smoking status: Former Smoker     Quit date: 12/31/1981    Years since quitting: 39.0  . Smokeless tobacco: Never Used  Substance Use Topics  . Alcohol use: Never  . Drug use: Never    Home Medications Prior to Admission medications   Medication Sig Start Date End Date Taking? Authorizing Provider  acetaminophen (TYLENOL) 325 MG tablet Place 2 tablets (650 mg total) into feeding tube every 6 (six) hours as needed for headache, fever or mild pain. 12/19/20   Danford, Earl Lites, MD  amLODipine (NORVASC) 5 MG tablet Place 1 tablet (5 mg total) into feeding tube daily. 12/20/20   Danford, Earl Lites, MD  lactulose (CHRONULAC) 10 GM/15ML solution Place 30 mLs (20 g total) into feeding tube 2 (two) times daily. 12/19/20   Danford, Earl Lites, MD  levothyroxine (SYNTHROID) 75 MCG tablet Place 1.5 tablets (112.5 mcg total) into feeding tube daily at 6 (six) AM. 12/20/20   Danford, Earl Lites, MD  morphine 20 MG/5ML solution Take 2.5 mLs (10 mg total) by mouth every 4 (four) hours as needed for pain. 12/19/20   Danford, Earl Lites, MD  Nutritional Supplements (FEEDING SUPPLEMENT, OSMOLITE 1.2 CAL,) LIQD Place 1,000 mLs into feeding tube continuous. 12/19/20   Danford, Earl Lites, MD  Nutritional Supplements (FEEDING SUPPLEMENT, PROSOURCE TF,) liquid Place 45 mLs into feeding tube daily. 12/19/20   Danford, Earl Lites, MD  rifaximin (XIFAXAN) 550 MG TABS tablet Place 1 tablet (550 mg total) into feeding  tube 2 (two) times daily. 12/19/20   Danford, Earl Lites, MD  spironolactone (ALDACTONE) 25 MG tablet Place 1 tablet (25 mg total) into feeding tube daily. 12/20/20   Danford, Earl Lites, MD  thiamine 100 MG tablet Place 1 tablet (100 mg total) into feeding tube daily. 12/19/20   Danford, Earl Lites, MD  Water For Irrigation, Sterile (FREE WATER) SOLN Place 200 mLs into feeding tube every 6 (six) hours. 12/19/20   Danford, Earl Lites, MD    Allergies    Patient has no known allergies.  Review of Systems    Review of Systems  Unable to perform ROS: Mental status change    Physical Exam Updated Vital Signs BP (!) 94/42 (BP Location: Right Arm)   Pulse 94   Resp (!) 24   SpO2 100%   Physical Exam Vitals and nursing note reviewed.  Constitutional:      Appearance: She is well-developed and well-nourished.  HENT:     Head: Normocephalic and atraumatic.     Mouth/Throat:     Mouth: Mucous membranes are moist.     Pharynx: Oropharynx is clear.  Eyes:     Pupils: Pupils are equal, round, and reactive to light.  Cardiovascular:     Rate and Rhythm: Regular rhythm. Tachycardia present.  Pulmonary:     Effort: Tachypnea present. No respiratory distress.     Breath sounds: No stridor.  Abdominal:     General: Abdomen is flat. There is no distension.     Comments: G-tube in place  Musculoskeletal:     Cervical back: Normal range of motion.  Skin:    General: Skin is warm and dry.     Coloration: Skin is pale. Skin is not jaundiced.  Neurological:     Mental Status: She is alert.     ED Results / Procedures / Treatments   Labs (all labs ordered are listed, but only abnormal results are displayed) Labs Reviewed  RESP PANEL BY RT-PCR (FLU A&B, COVID) [id number]  CULTURE, BLOOD (ROUTINE X 2)  CULTURE, BLOOD (ROUTINE X 2)  URINE CULTURE  LACTIC ACID, PLASMA  LACTIC ACID, PLASMA  COMPREHENSIVE METABOLIC PANEL  CBC WITH DIFFERENTIAL/PLATELET  PROTIME-INR  APTT  URINALYSIS, ROUTINE W REFLEX MICROSCOPIC    EKG None  Radiology No results found.  Procedures Procedures CRITICAL CARE Performed by: Marily Memos Total critical care time: 35 minutes Critical care time was exclusive of separately billable procedures and treating other patients. Critical care was necessary to treat or prevent imminent or life-threatening deterioration. Critical care was time spent personally by me on the following activities: development of treatment plan with patient and/or surrogate as well as  nursing, discussions with consultants, evaluation of patient's response to treatment, examination of patient, obtaining history from patient or surrogate, ordering and performing treatments and interventions, ordering and review of laboratory studies, ordering and review of radiographic studies, pulse oximetry and re-evaluation of patient's condition.   Medications Ordered in ED Medications  lactated ringers infusion (has no administration in time range)  ceFEPIme (MAXIPIME) 2 g in sodium chloride 0.9 % 100 mL IVPB (has no administration in time range)  metroNIDAZOLE (FLAGYL) IVPB 500 mg (has no administration in time range)  vancomycin (VANCOREADY) IVPB 1500 mg/300 mL (has no administration in time range)  lactated ringers bolus 1,000 mL (has no administration in time range)    ED Course  I have reviewed the triage vital signs and the nursing notes.  Pertinent labs & imaging results  that were available during my care of the patient were reviewed by me and considered in my medical decision making (see chart for details).    MDM Rules/Calculators/A&P                         Patient mental status is improving. So far workup reveals milld AKI. Similar anemia. Leukocytosis. Already getting labs/abx for sepsis protocol but doesn't appear to be severe sepsis or septic shock at this time. No evidence of cellulitis in area of previous ulcer.  reeval and MS is stable but BP's still soft, hasn't received 30 cc/kg yet. Will order rest. In and out cath.  No definitive UTI. Possibly PNA. Will d/w hospitalist regarding admission.  Final Clinical Impression(s) / ED Diagnoses Final diagnoses:  None    Rx / DC Orders ED Discharge Orders    None       Nefertiti Mohamad, Barbara Cower, MD 01/06/21 [phone]

## 2021-01-07 ENCOUNTER — Encounter (HOSPITAL_COMMUNITY): Payer: Self-pay | Admitting: Internal Medicine

## 2021-01-07 ENCOUNTER — Inpatient Hospital Stay (HOSPITAL_COMMUNITY): Payer: Medicare Other

## 2021-01-07 ENCOUNTER — Other Ambulatory Visit: Payer: Self-pay

## 2021-01-07 DIAGNOSIS — R4182 Altered mental status, unspecified: Secondary | ICD-10-CM | POA: Diagnosis not present

## 2021-01-07 DIAGNOSIS — A419 Sepsis, unspecified organism: Principal | ICD-10-CM

## 2021-01-07 DIAGNOSIS — B9689 Other specified bacterial agents as the cause of diseases classified elsewhere: Secondary | ICD-10-CM

## 2021-01-07 DIAGNOSIS — R0600 Dyspnea, unspecified: Secondary | ICD-10-CM

## 2021-01-07 DIAGNOSIS — K7290 Hepatic failure, unspecified without coma: Secondary | ICD-10-CM

## 2021-01-07 DIAGNOSIS — K259 Gastric ulcer, unspecified as acute or chronic, without hemorrhage or perforation: Secondary | ICD-10-CM

## 2021-01-07 DIAGNOSIS — I8511 Secondary esophageal varices with bleeding: Secondary | ICD-10-CM | POA: Diagnosis not present

## 2021-01-07 DIAGNOSIS — K7581 Nonalcoholic steatohepatitis (NASH): Secondary | ICD-10-CM | POA: Diagnosis not present

## 2021-01-07 LAB — CBC
HCT: 27.8 % — ABNORMAL LOW (ref 36.0–46.0)
Hemoglobin: 8.4 g/dL — ABNORMAL LOW (ref 12.0–15.0)
MCH: 28.4 pg (ref 26.0–34.0)
MCHC: 30.2 g/dL (ref 30.0–36.0)
MCV: 93.9 fL (ref 80.0–100.0)
Platelets: 409 10*3/uL — ABNORMAL HIGH (ref 150–400)
RBC: 2.96 MIL/uL — ABNORMAL LOW (ref 3.87–5.11)
RDW: 17.5 % — ABNORMAL HIGH (ref 11.5–15.5)
WBC: 8.2 10*3/uL (ref 4.0–10.5)
nRBC: 2.1 % — ABNORMAL HIGH (ref 0.0–0.2)

## 2021-01-07 LAB — COMPREHENSIVE METABOLIC PANEL
ALT: 42 U/L (ref 0–44)
AST: 52 U/L — ABNORMAL HIGH (ref 15–41)
Albumin: 2.1 g/dL — ABNORMAL LOW (ref 3.5–5.0)
Alkaline Phosphatase: 211 U/L — ABNORMAL HIGH (ref 38–126)
Anion gap: 10 (ref 5–15)
BUN: 21 mg/dL (ref 8–23)
CO2: 21 mmol/L — ABNORMAL LOW (ref 22–32)
Calcium: 8.5 mg/dL — ABNORMAL LOW (ref 8.9–10.3)
Chloride: 106 mmol/L (ref 98–111)
Creatinine, Ser: 0.95 mg/dL (ref 0.44–1.00)
GFR, Estimated: 60 mL/min — ABNORMAL LOW (ref >60)
Glucose, Bld: 80 mg/dL (ref 70–99)
Potassium: 5.1 mmol/L (ref 3.5–5.1)
Sodium: 137 mmol/L (ref 135–145)
Total Bilirubin: 1 mg/dL (ref 0.3–1.2)
Total Protein: 6.5 g/dL (ref 6.5–8.1)

## 2021-01-07 LAB — BPAM RBC
Blood Product Expiration Date: 202204042359
ISSUE DATE / TIME: 202203051731
Unit Type and Rh: 5100

## 2021-01-07 LAB — TYPE AND SCREEN
ABO/RH(D): O POS
Antibody Screen: NEGATIVE
Unit division: 0

## 2021-01-07 LAB — GLUCOSE, CAPILLARY
Glucose-Capillary: 68 mg/dL — ABNORMAL LOW (ref 70–99)
Glucose-Capillary: 81 mg/dL (ref 70–99)
Glucose-Capillary: 82 mg/dL (ref 70–99)
Glucose-Capillary: 91 mg/dL (ref 70–99)
Glucose-Capillary: 95 mg/dL (ref 70–99)

## 2021-01-07 LAB — D-DIMER, QUANTITATIVE: D-Dimer, Quant: 6.45 ug/mL-FEU — ABNORMAL HIGH (ref 0.00–0.50)

## 2021-01-07 LAB — CK: Total CK: 34 U/L — ABNORMAL LOW (ref 38–234)

## 2021-01-07 MED ORDER — ADULT MULTIVITAMIN W/MINERALS CH
1.0000 | ORAL_TABLET | Freq: Every day | ORAL | Status: DC
  Administered 2021-01-07 – 2021-01-12 (×6): 1
  Filled 2021-01-07 (×6): qty 1

## 2021-01-07 MED ORDER — OSMOLITE 1.2 CAL PO LIQD
1000.0000 mL | ORAL | Status: DC
  Administered 2021-01-07 – 2021-01-09 (×3): 1000 mL
  Filled 2021-01-07 (×5): qty 1000

## 2021-01-07 MED ORDER — IOHEXOL 350 MG/ML SOLN
99.0000 mL | Freq: Once | INTRAVENOUS | Status: AC | PRN
  Administered 2021-01-07: 99 mL via INTRAVENOUS

## 2021-01-07 MED ORDER — IOHEXOL 9 MG/ML PO SOLN
500.0000 mL | ORAL | Status: AC
  Administered 2021-01-07 (×2): 500 mL via ORAL

## 2021-01-07 MED ORDER — SODIUM CHLORIDE 0.9 % IV SOLN
2.0000 g | Freq: Two times a day (BID) | INTRAVENOUS | Status: DC
  Administered 2021-01-07 – 2021-01-08 (×3): 2 g via INTRAVENOUS
  Filled 2021-01-07 (×3): qty 2

## 2021-01-07 MED ORDER — DEXTROSE-NACL 5-0.45 % IV SOLN: INTRAVENOUS | Status: DC

## 2021-01-07 MED ORDER — PROSOURCE TF PO LIQD
45.0000 mL | Freq: Every day | ORAL | Status: AC
  Administered 2021-01-07 – 2021-01-09 (×3): 45 mL
  Filled 2021-01-07 (×3): qty 45

## 2021-01-07 NOTE — Consult Note (Addendum)
[facility]       Reason for Consult: bacteremia    Referring Physician: Dr. Catha GosselinMikhail  Principal Problem:   Sepsis [facility](HCC) Active Problems:   Hypothyroid   Acute metabolic encephalopathy   GI bleed   Acute respiratory failure with hypoxia and hypercapnia (HCC)   Acute lower UTI   Normocytic anemia   Dysphagia   PEG (percutaneous endoscopic gastrostomy) status (HCC)   Hypotension   . feeding supplement (PROSource TF)  45 mL Per Tube Daily  . free water  200 mL Per Tube Q6H  . lactulose  20 g Per Tube BID  . levothyroxine  125 mcg Per Tube QAC breakfast  . multivitamin with minerals  1 tablet Per Tube Daily  . nystatin ointment  1 application Topical BID  . pantoprazole  40 mg Intravenous Q12H  . rifaximin  550 mg Per Tube BID  . thiamine  100 mg Per Tube Daily    Recommendations: daptomycin and cefepime  consider further work up of GI source  Assessment: She has multiple organisms in 1 bottle of 4 blood culture bottle of unknown significance.  I am not sure if it is related to her presentation of AMS and all these organisms in one bottle and not any others makes me suspicious for it being all contaminated.  Certainly though could be a GI source, doubt this is a urine infection.  Additionally, the UA and culture are relatively benign. ? GI mass. With SOB, hypoxia, tachycardia, ? PE.    Will continue antibiotics for now.   Discussed with Dr. Catha GosselinMikhail  Dr. Daiva EvesVan Dam to follow up tomorrow   Antibiotics: Daptomycin, cefepime, piptazo  HPI: Kaylee King is a 80 y.o. female with a history of NASH cirrhosis with portal hypertension, varices with a history of a GI bleed, living at a NH and came in with AMS.  No history available from the patient. She was brought in by EMS with tachycardia, diaphoresis and hypoxia and work up is positive for 1 blood culture with multiple organisms.  No report of any fever and no fever so far.  WBC 16.7 and has normalized.   Lactate elevated.  She is awake, conversant and answers questions but is confused.  Her main complaint is dypsnea.     Review of Systems:  Constitutional: negative for fevers and chills Respiratory: positive for cough and dypsnea Integument/breast: negative for rash All other systems reviewed and are negative    Past Medical History:  Diagnosis Date  . Cirrhosis (HCC)   . Thyroid disease    hypothyroid     Social History   Tobacco Use  . Smoking status: Former Smoker    Quit date: 12/31/1981    Years since quitting: 39.0  . Smokeless tobacco: Never Used  Substance Use Topics  . Alcohol use: Never  . Drug use: Never  per chart record  No family history on file. unobtainable from the patient  No Known Allergies  Physical Exam: Constitutional: in no apparent distress  Vitals:   01/07/21 0836 01/07/21 1136  BP: 118/61 (!) 112/57  Pulse: 62 64  Resp: 19 20  Temp: 98.3 F (36.8 C) 98.3 F (36.8 C)  SpO2: 99% 99%   EYES: anicteric Cardiovascular: Cor RRR Respiratory: + wheezes GI: Bowel sounds are normal, liver is not enlarged, spleen is not enlarged Musculoskeletal: no pedal edema noted Skin: negatives: no rash Neuro: alert, no oriented - thinks it is 1965, does not  know where she lives  Lab Results  Component Value Date   WBC 8.2 01/07/2021   HGB 8.4 (L) 01/07/2021   HCT 27.8 (L) 01/07/2021   MCV 93.9 01/07/2021   PLT 409 (H) 01/07/2021    Lab Results  Component Value Date   CREATININE 0.95 01/07/2021   BUN 21 01/07/2021   NA 137 01/07/2021   K 5.1 01/07/2021   CL 106 01/07/2021   CO2 21 (L) 01/07/2021    Lab Results  Component Value Date   ALT 42 01/07/2021   AST 52 (H) 01/07/2021   ALKPHOS 211 (H) 01/07/2021     Microbiology: Recent Results (from the past 240 hour(s))  Urine culture     Status: Abnormal (Preliminary result)   Collection Time: 01/06/21  3:32 AM   Specimen: In/Out Cath Urine  Result Value Ref Range Status   Specimen  Description IN/OUT CATH URINE  Final   Special Requests NONE  Final   Culture (A)  Final    20,000 COLONIES/mL GRAM NEGATIVE RODS SUSCEPTIBILITIES TO FOLLOW Performed at [facility], [address], [place], [place] [address]    Report Status PENDING  Incomplete  Blood Culture (routine x 2)     Status: None (Preliminary result)   Collection Time: 01/06/21  3:50 AM   Specimen: BLOOD  Result Value Ref Range Status   Specimen Description BLOOD SITE NOT SPECIFIED  Final   Special Requests   Final    BOTTLES DRAWN AEROBIC AND ANAEROBIC Blood Culture results may not be optimal due to an inadequate volume of blood received in culture bottles   Culture  Setup Time   Final    GRAM POSITIVE COCCI IN CHAINS IN PAIRS GRAM NEGATIVE RODS ANAEROBIC BOTTLE ONLY CRITICAL RESULT CALLED TO, READ BACK BY AND VERIFIED WITH: Malva Cogan PHARMD @1910  01/06/21 EB    Culture   Final    GRAM NEGATIVE RODS GRAM POSITIVE COCCI CULTURE REINCUBATED FOR BETTER GROWTH Performed at [facility], [address] [place], [place]    Report Status PENDING  Incomplete  Blood Culture ID Panel (Reflexed)     Status: Abnormal   Collection Time: 01/06/21  3:50 AM  Result Value Ref Range Status   Enterococcus faecalis DETECTED (A) NOT DETECTED Final    Comment: CRITICAL RESULT CALLED TO, READ BACK BY AND VERIFIED WITH: 03/08/21 PHARMD @1910  01/06/21 EB    Enterococcus Faecium DETECTED (A) NOT DETECTED Final    Comment: CRITICAL RESULT CALLED TO, READ BACK BY AND VERIFIED WITH: KIM HURTH PHARMD @1910  01/06/21 EB    Listeria monocytogenes NOT DETECTED NOT DETECTED Final   Staphylococcus species NOT DETECTED NOT DETECTED Final   Staphylococcus aureus (BCID) NOT DETECTED NOT DETECTED Final   Staphylococcus epidermidis NOT DETECTED NOT DETECTED Final   Staphylococcus lugdunensis NOT DETECTED NOT DETECTED Final   Streptococcus species DETECTED (A) NOT DETECTED Final    Comment: CRITICAL RESULT CALLED  TO, READ BACK BY AND VERIFIED WITH: KIM HURTH PHARMD @1910  01/06/21 EB    Streptococcus agalactiae DETECTED (A) NOT DETECTED Final    Comment: CRITICAL RESULT CALLED TO, READ BACK BY AND VERIFIED WITH: KIM HURTH PHARMD @1910  01/06/21 EB    Streptococcus pneumoniae NOT DETECTED NOT DETECTED Final   Streptococcus pyogenes NOT DETECTED NOT DETECTED Final   A.calcoaceticus-baumannii NOT DETECTED NOT DETECTED Final   Bacteroides fragilis NOT DETECTED NOT DETECTED Final   Enterobacterales DETECTED (A) NOT DETECTED Final  Comment: Enterobacterales represent a large order of gram negative bacteria, not a single organism. CRITICAL RESULT CALLED TO, READ BACK BY AND VERIFIED WITH: Malva Cogan PHARMD @1910  01/06/21 EB    Enterobacter cloacae complex NOT DETECTED NOT DETECTED Final   Escherichia coli NOT DETECTED NOT DETECTED Final   Klebsiella aerogenes NOT DETECTED NOT DETECTED Final   Klebsiella oxytoca NOT DETECTED NOT DETECTED Final   Klebsiella pneumoniae NOT DETECTED NOT DETECTED Final   Proteus species DETECTED (A) NOT DETECTED Final    Comment: CRITICAL RESULT CALLED TO, READ BACK BY AND VERIFIED WITH: 03/08/21 PHARMD @1910  01/06/21 EB    Salmonella species NOT DETECTED NOT DETECTED Final   Serratia marcescens NOT DETECTED NOT DETECTED Final   Haemophilus influenzae NOT DETECTED NOT DETECTED Final   Neisseria meningitidis NOT DETECTED NOT DETECTED Final   Pseudomonas aeruginosa NOT DETECTED NOT DETECTED Final   Stenotrophomonas maltophilia NOT DETECTED NOT DETECTED Final   Candida albicans NOT DETECTED NOT DETECTED Final   Candida auris NOT DETECTED NOT DETECTED Final   Candida glabrata NOT DETECTED NOT DETECTED Final   Candida krusei NOT DETECTED NOT DETECTED Final   Candida parapsilosis NOT DETECTED NOT DETECTED Final   Candida tropicalis NOT DETECTED NOT DETECTED Final   Cryptococcus neoformans/gattii NOT DETECTED NOT DETECTED Final   CTX-M ESBL NOT DETECTED NOT DETECTED Final    Carbapenem resistance IMP NOT DETECTED NOT DETECTED Final   Carbapenem resistance KPC NOT DETECTED NOT DETECTED Final   Carbapenem resistance NDM NOT DETECTED NOT DETECTED Final   Carbapenem resist OXA 48 LIKE NOT DETECTED NOT DETECTED Final   Vancomycin resistance DETECTED (A) NOT DETECTED Final    Comment: CRITICAL RESULT CALLED TO, READ BACK BY AND VERIFIED WITH: KIM HURTH PHARMD @1910  01/06/21 EB    Carbapenem resistance VIM NOT DETECTED NOT DETECTED Final    Comment: Performed at [facility], [address] [place], [facility] [address]  Blood Culture (routine x 2)     Status: None (Preliminary result)   Collection Time: 01/06/21  3:52 AM   Specimen: BLOOD  Result Value Ref Range Status   Specimen Description BLOOD SITE NOT SPECIFIED  Final   Special Requests   Final    BOTTLES DRAWN AEROBIC AND ANAEROBIC Blood Culture adequate volume   Culture   Final    NO GROWTH 1 DAY Performed at [facility], [address], [place], [facility] [address]    Report Status PENDING  Incomplete  Resp Panel by RT-PCR (Flu A&B, Covid) Nasopharyngeal Swab     Status: None   Collection Time: 01/06/21  4:37 AM   Specimen: Nasopharyngeal Swab; Nasopharyngeal(NP) swabs in vial transport medium  Result Value Ref Range Status   SARS Coronavirus 2 by RT PCR NEGATIVE NEGATIVE Final    Comment: (NOTE) SARS-CoV-2 target nucleic acids are NOT DETECTED.  The SARS-CoV-2 RNA is generally detectable in upper respiratory specimens during the acute phase of infection. The lowest concentration of SARS-CoV-2 viral copies this assay can detect is 138 copies/mL. A negative result does not preclude SARS-Cov-2 infection and should not be used as the sole basis for treatment or other patient management decisions. A negative result may occur with  improper specimen collection/handling, submission of specimen other than nasopharyngeal swab, presence of viral mutation(s) within the areas targeted by this assay,  and inadequate number of viral copies(<138 copies/mL). A negative result must be combined with clinical observations, patient history, and epidemiological information. The expected result  is Negative.  Fact Sheet for Patients:  BloggerCourse.com  Fact Sheet for Healthcare Providers:  SeriousBroker.it  This test is no t yet approved or cleared by the [place] FDA and  has been authorized for detection and/or diagnosis of SARS-CoV-2 by FDA under an Emergency Use Authorization (EUA). This EUA will remain  in effect (meaning this test can be used) for the duration of the COVID-19 declaration under Section 564(b)(1) of the Act, 21 U.S.C.section 360bbb-3(b)(1), unless the authorization is terminated  or revoked sooner.       Influenza A by PCR NEGATIVE NEGATIVE Final   Influenza B by PCR NEGATIVE NEGATIVE Final    Comment: (NOTE) The Xpert Xpress SARS-CoV-2/FLU/RSV plus assay is intended as an aid in the diagnosis of influenza from Nasopharyngeal swab specimens and should not be used as a sole basis for treatment. Nasal washings and aspirates are unacceptable for Xpert Xpress SARS-CoV-2/FLU/RSV testing.  Fact Sheet for Patients: BloggerCourse.com  Fact Sheet for Healthcare Providers: SeriousBroker.it  This test is not yet approved or cleared by the [place] FDA and has been authorized for detection and/or diagnosis of SARS-CoV-2 by FDA under an Emergency Use Authorization (EUA). This EUA will remain in effect (meaning this test can be used) for the duration of the COVID-19 declaration under Section 564(b)(1) of the Act, 21 U.S.C. section 360bbb-3(b)(1), unless the authorization is terminated or revoked.  Performed at [facility], [address], [place], [place] [address]     Gardiner Barefoot, MD [facility] [email or web address] 01/07/2021, 2:43 PM

## 2021-01-07 NOTE — Progress Notes (Addendum)
PROGRESS NOTE    Kaylee King  ZOX:[id number] DOB: 01/29/1941 DOA: 01/06/2021 PCP: Fleet ContrasAvbuere, Edwin, MD   Brief Narrative:  HPI On 01/06/2021 by Dr. Madelyn Flavorsondell Smith Kaylee King is a 80 y.o. 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  Patient unable to provide any significant history at this time and only tells me to stop trying to examine the patient.  Daughter notes that recently when she has visited her at [facility] patient is curled up on her side it seems due to pain.  Whenever her pain has been adequately treated she will sit up on the side of bed and is able to answer questions.  Recently the patient has had what appeared to be dried blood on her lips, but daughter had not been told of any episodes of vomiting.   Patient was found by EMS to be tachycardic and diaphoretic with O2 saturations around 85% on room air.  She was placed on nonrebreather at 10 L with improvement of O2 saturations to 93%.   Recently hospitalized from 1/20-2/15 after presenting with hemorrhagic shock due to a variceal bleed requiring 6 units PRBCs and 2 units of FFP.  EGD by GI on 1/20 showed esophageal varices which were banded and several nonbleeding gastric ulcers.  Patient was intubated for airway protection, and diagnosed with pneumonia treated with 7 days of Rocephin IV.  Patient was able to be weaned off the ventilator to 2 L of nasal cannula oxygen, but required PEG tube placement for nutrition.  Urine cultures from that hospitalization grew out E. coli and Morganella.  E. coli was sensitive to Rocephin, but sensitivities were not reported for Rocephin with Morganella.  She was [facility].  Interim history Admitted for severe sepsis suspected to be secondary to UTI, on broad spectrum antibiotics.  Patient also noted to have possible GI bleed,  gastroenterology consulted and appreciated. Assessment & Plan   Severe sepsis secondary to UTI  -Patient presented with tachypnea, leukocytosis and elevated lactic acid level along with acute metabolic encephalopathy -Chest x-ray unremarkable for infection. -UA positive for moderate leukocytes, rare bacteria, 21-50 WBCs -Previous hospitalization, patient had >100K of E. coli and Morganella and had received Rocephin for suspected treatment of community-acquired pneumonia -Patient initially placed on vancomycin and Zosyn -Blood culture showed GPC in chains and pairs, GNR-reflex ID detected Enterococcus faecalis, Enterococcus faecium, Streptococcus species, Streptococcus agalactiae, Enterobacterales, Proteus species, VRE -Pharmacy transitioned antibiotics to daptomycin and ceftriaxone -Will discuss blood culture results with infectious disease- recommended ruling out PE, will obtain DDimer -Discussed with gastroenterology, given the amount of different organisms on culture- will obtain CT chest/abd/pelvis with contrast   Acute metabolic encephalopathy -Patient was noted to be altered at her facility -May be secondary to the above with urinary tract infection versus cirrhosis -Continue to monitor and treat underlying conditions -Currently in soft restraints  Acute hypoxic respiratory failure -Initially oxygen saturations were 85% on room air and she required a nonrebreather -Continue supplemental oxygen to maintain 92% -As above chest x-ray unremarkable for infiltrate or infection however did show low lung volumes -Continue incentive spirometry if able  Chronic normocytic anemia/possible GI bleed -Patient with history of esophageal and gastric varices -Hemoglobin of 7.7 on admission, up to 8.4 today -FOBT was positive  -Gastroenterology consulted and appreciated -Currently holding tube feeds -Continue IV Protonix  Hypotension -Patient noted to have some soft  blood pressures -Have  placed her on IV fluids -Amlodipine held  Cirrhosis with portal hypertension and history of varices -As above, gastroenterology consulted and appreciated -She recently underwent EGD on 120 and had banding of 6 esophageal varices with noted nonbleeding gastric ulcers -Pending abdominal ultrasound -Continue rifaximin and lactulose  Hypothyroidism -TSH 15.113, appears improved from TSH in February 2022 -Continue Synthroid, dose increased 125 mcg daily -Repeat TSH in the outpatient setting in 4 to 6 weeks  Dysphagia -Patient has PEG placement -Speech therapy consulted and recommends continued n.p.o.  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 -Follow-up imaging recommended in 3 years  Elevated liver enzymes  -Continue to monitor  Possible inguinal yeast infection -Patient with noted erythema in the inguinal region, no signs of open wound -Continue nystatin  Hypoalbuminemia -Albumin 2.3 -Nutrition consulted  DVT Prophylaxis  SCDs  Code Status: Full  Family Communication: None at bedside  Disposition Plan:  Status is: Inpatient  Remains inpatient appropriate because:Altered mental status, IV treatments appropriate due to intensity of illness or inability to take PO and Inpatient level of care appropriate due to severity of illness   Dispo: The patient is from: SNF              Anticipated d/c is to: SNF              Patient currently is not medically stable to d/c.   Difficult to place patient No   Consultants Gastroenterology Infectious disease  Procedures  None  Antibiotics   Anti-infectives (From admission, onward)   Start     Dose/Rate Route Frequency Ordered Stop   01/08/21 0500  vancomycin (VANCOREADY) IVPB 1250 mg/250 mL  Status:  Discontinued        1,250 mg 166.7 mL/hr over 90 Minutes Intravenous Every 48 hours 01/06/21 1051 01/06/21 2053   01/06/21 2300   cefTRIAXone (ROCEPHIN) 2 g in sodium chloride 0.9 % 100 mL IVPB        2 g 200 mL/hr over 30 Minutes Intravenous Every 24 hours 01/06/21 2053     01/06/21 2145  DAPTOmycin (CUBICIN) 560 mg in sodium chloride 0.9 % IVPB        560 mg 222.4 mL/hr over 30 Minutes Intravenous Daily 01/06/21 2053     01/06/21 1400  piperacillin-tazobactam (ZOSYN) IVPB 3.375 g  Status:  Discontinued       "Followed by" Linked Group Details   3.375 g 12.5 mL/hr over 240 Minutes Intravenous Every 8 hours 01/06/21 0836 01/06/21 2053   01/06/21 1030  rifaximin (XIFAXAN) tablet 550 mg        550 mg Per Tube 2 times daily 01/06/21 0910     01/06/21 0845  piperacillin-tazobactam (ZOSYN) IVPB 3.375 g       "Followed by" Linked Group Details   3.375 g 100 mL/hr over 30 Minutes Intravenous  Once 01/06/21 0836 01/06/21 1010   01/06/21 0830  piperacillin-tazobactam (ZOSYN) IVPB 3.375 g  Status:  Discontinued        3.375 g 100 mL/hr over 30 Minutes Intravenous Every 8 hours 01/06/21 0826 01/06/21 0836   01/06/21 0345  ceFEPIme (MAXIPIME) 2 g in sodium chloride 0.9 % 100 mL IVPB        2 g 200 mL/hr over 30 Minutes Intravenous  Once 01/06/21 0332 01/06/21 0435   01/06/21 0345  metroNIDAZOLE (FLAGYL) IVPB 500 mg  500 mg 100 mL/hr over 60 Minutes Intravenous  Once 01/06/21 0332 01/06/21 0753   01/06/21 0345  vancomycin (VANCOREADY) IVPB 1000 mg/200 mL  Status:  Discontinued        1,000 mg 200 mL/hr over 60 Minutes Intravenous  Once 01/06/21 0332 01/06/21 0341   01/06/21 0345  vancomycin (VANCOREADY) IVPB 1500 mg/300 mL        1,500 mg 150 mL/hr over 120 Minutes Intravenous  Once 01/06/21 0341 01/06/21 0643      Subjective:   Kaylee King seen and examined today.  Patient continues to be altered this morning and not responding to any of my questions.  Objective:   Vitals:   01/07/21 0011 01/07/21 0451 01/07/21 0500 01/07/21 0836  BP:  98/62  118/61  Pulse:  67  62  Resp:  16  19  Temp:  98.7 F (37.1 C)   98.3 F (36.8 C)  TempSrc:  Oral  Oral  SpO2:  100%  99%  Weight: 70.3 kg  69.9 kg     Intake/Output Summary (Last 24 hours) at 01/07/2021 1030 Last data filed at 01/07/2021 0523 Gross per 24 hour  Intake 996.16 ml  Output --  Net 996.16 ml   Filed Weights   01/06/21 2000 01/07/21 0011 01/07/21 0500  Weight: 69.9 kg 70.3 kg 69.9 kg    Exam  General: Well developed, chronically ill-appearing, NAD  HEENT: NCAT,  mucous membranes dry-lips are cracked with dried blood present  Cardiovascular: S1 S2 auscultated, RRR  Respiratory: Diminished breath sounds, anteriorly  Abdomen: Soft, nontender, nondistended, + bowel sounds, G-tube in place  Extremities: warm dry without cyanosis clubbing or edema  Neuro: Unable to fully assess    Data Reviewed: I have personally reviewed following labs and imaging studies  CBC: Recent Labs  Lab 01/06/21 0332 01/06/21 1211 01/06/21 1638 01/06/21 2229 01/07/21 0300  WBC 16.7*  --   --   --  8.2  NEUTROABS 15.7*  --   --   --   --   HGB 7.7* 6.7* 7.1* 7.7* 8.4*  HCT 26.5* 23.4* 23.2* 24.4* 27.8*  MCV 97.1  --   --   --  93.9  PLT 498*  --   --   --  409*   Basic Metabolic Panel: Recent Labs  Lab 01/06/21 0332 01/07/21 0300  NA 133* 137  K 5.2* 5.1  CL 101 106  CO2 22 21*  GLUCOSE 191* 80  BUN 26* 21  CREATININE 0.97 0.95  CALCIUM 8.4* 8.5*   GFR: Estimated Creatinine Clearance: 43.8 mL/min (by C-G formula based on SCr of 0.95 mg/dL). Liver Function Tests: Recent Labs  Lab 01/06/21 0332 01/07/21 0300  AST 63* 52*  ALT 57* 42  ALKPHOS 289* 211*  BILITOT 1.1 1.0  PROT 7.2 6.5  ALBUMIN 2.3* 2.1*   No results for input(s): LIPASE, AMYLASE in the last 168 hours. Recent Labs  Lab 01/06/21 0436  AMMONIA 29   Coagulation Profile: Recent Labs  Lab 01/06/21 0332 01/06/21 0503  INR 1.1 1.1   Cardiac Enzymes: Recent Labs  Lab 01/07/21 0300  CKTOTAL 34*   BNP (last 3 results) No results for input(s): PROBNP  in the last 8760 hours. HbA1C: No results for input(s): HGBA1C in the last 72 hours. CBG: Recent Labs  Lab 01/06/21 1642 01/07/21 0835 01/07/21 0916  GLUCAP 81 68* 81   Lipid Profile: No results for input(s): CHOL, HDL, LDLCALC, TRIG, CHOLHDL, LDLDIRECT in the last 72  hours. Thyroid Function Tests: Recent Labs    01/06/21 0436  TSH 15.113*   Anemia Panel: No results for input(s): VITAMINB12, FOLATE, FERRITIN, TIBC, IRON, RETICCTPCT in the last 72 hours. Urine analysis:    Component Value Date/Time   COLORURINE YELLOW 01/06/2021 0621   APPEARANCEUR HAZY (A) 01/06/2021 0621   LABSPEC 1.013 01/06/2021 0621   PHURINE 6.0 01/06/2021 0621   GLUCOSEU NEGATIVE 01/06/2021 0621   HGBUR NEGATIVE 01/06/2021 0621   BILIRUBINUR NEGATIVE 01/06/2021 0621   KETONESUR NEGATIVE 01/06/2021 0621   PROTEINUR 30 (A) 01/06/2021 0621   NITRITE NEGATIVE 01/06/2021 0621   LEUKOCYTESUR MODERATE (A) 01/06/2021 0621   Sepsis Labs: @LABRCNTIP (procalcitonin:4,lacticidven:4)  ) Recent Results (from the past 240 hour(s))  Blood Culture (routine x 2)     Status: None (Preliminary result)   Collection Time: 01/06/21  3:50 AM   Specimen: BLOOD  Result Value Ref Range Status   Specimen Description BLOOD SITE NOT SPECIFIED  Final   Special Requests   Final    BOTTLES DRAWN AEROBIC AND ANAEROBIC Blood Culture results may not be optimal due to an inadequate volume of blood received in culture bottles   Culture  Setup Time   Final    GRAM POSITIVE COCCI IN CHAINS IN PAIRS GRAM NEGATIVE RODS ANAEROBIC BOTTLE ONLY CRITICAL RESULT CALLED TO, READ BACK BY AND VERIFIED WITH: 03/08/21 PHARMD @1910  01/06/21 EB Performed at [facility], [address] [place], [address] [place]    Culture GRAM POSITIVE COCCI GRAM NEGATIVE RODS   Final   Report Status PENDING  Incomplete  Blood Culture ID Panel (Reflexed)     Status: Abnormal   Collection Time: 01/06/21  3:50 AM  Result Value Ref Range Status    Enterococcus faecalis DETECTED (A) NOT DETECTED Final    Comment: CRITICAL RESULT CALLED TO, READ BACK BY AND VERIFIED WITH: KIM HURTH PHARMD @1910  01/06/21 EB    Enterococcus Faecium DETECTED (A) NOT DETECTED Final    Comment: CRITICAL RESULT CALLED TO, READ BACK BY AND VERIFIED WITH: KIM HURTH PHARMD @1910  01/06/21 EB    Listeria monocytogenes NOT DETECTED NOT DETECTED Final   Staphylococcus species NOT DETECTED NOT DETECTED Final   Staphylococcus aureus (BCID) NOT DETECTED NOT DETECTED Final   Staphylococcus epidermidis NOT DETECTED NOT DETECTED Final   Staphylococcus lugdunensis NOT DETECTED NOT DETECTED Final   Streptococcus species DETECTED (A) NOT DETECTED Final    Comment: CRITICAL RESULT CALLED TO, READ BACK BY AND VERIFIED WITH: KIM HURTH PHARMD @1910  01/06/21 EB    Streptococcus agalactiae DETECTED (A) NOT DETECTED Final    Comment: CRITICAL RESULT CALLED TO, READ BACK BY AND VERIFIED WITH: KIM HURTH PHARMD @1910  01/06/21 EB    Streptococcus pneumoniae NOT DETECTED NOT DETECTED Final   Streptococcus pyogenes NOT DETECTED NOT DETECTED Final   A.calcoaceticus-baumannii NOT DETECTED NOT DETECTED Final   Bacteroides fragilis NOT DETECTED NOT DETECTED Final   Enterobacterales DETECTED (A) NOT DETECTED Final    Comment: Enterobacterales represent a large order of gram negative bacteria, not a single organism. CRITICAL RESULT CALLED TO, READ BACK BY AND VERIFIED WITH: PHARMD @1910  01/06/21 EB    Enterobacter cloacae complex NOT DETECTED NOT DETECTED Final   Escherichia coli NOT DETECTED NOT DETECTED Final   Klebsiella aerogenes NOT DETECTED NOT DETECTED Final   Klebsiella oxytoca NOT DETECTED NOT DETECTED Final   Klebsiella pneumoniae NOT DETECTED NOT DETECTED Final   Proteus species DETECTED (A) NOT DETECTED Final  Comment: CRITICAL RESULT CALLED TO, READ BACK BY AND VERIFIED WITH: Malva Cogan PHARMD @1910  01/06/21 EB    Salmonella species NOT DETECTED NOT DETECTED Final    Serratia marcescens NOT DETECTED NOT DETECTED Final   Haemophilus influenzae NOT DETECTED NOT DETECTED Final   Neisseria meningitidis NOT DETECTED NOT DETECTED Final   Pseudomonas aeruginosa NOT DETECTED NOT DETECTED Final   Stenotrophomonas maltophilia NOT DETECTED NOT DETECTED Final   Candida albicans NOT DETECTED NOT DETECTED Final   Candida auris NOT DETECTED NOT DETECTED Final   Candida glabrata NOT DETECTED NOT DETECTED Final   Candida krusei NOT DETECTED NOT DETECTED Final   Candida parapsilosis NOT DETECTED NOT DETECTED Final   Candida tropicalis NOT DETECTED NOT DETECTED Final   Cryptococcus neoformans/gattii NOT DETECTED NOT DETECTED Final   CTX-M ESBL NOT DETECTED NOT DETECTED Final   Carbapenem resistance IMP NOT DETECTED NOT DETECTED Final   Carbapenem resistance KPC NOT DETECTED NOT DETECTED Final   Carbapenem resistance NDM NOT DETECTED NOT DETECTED Final   Carbapenem resist OXA 48 LIKE NOT DETECTED NOT DETECTED Final   Vancomycin resistance DETECTED (A) NOT DETECTED Final    Comment: CRITICAL RESULT CALLED TO, READ BACK BY AND VERIFIED WITH: 03/08/21 PHARMD @1910  01/06/21 EB    Carbapenem resistance VIM NOT DETECTED NOT DETECTED Final    Comment: Performed at [facility], [address], [place], [address] [place]  Blood Culture (routine x 2)     Status: None (Preliminary result)   Collection Time: 01/06/21  3:52 AM   Specimen: BLOOD  Result Value Ref Range Status   Specimen Description BLOOD SITE NOT SPECIFIED  Final   Special Requests   Final    BOTTLES DRAWN AEROBIC AND ANAEROBIC Blood Culture adequate volume   Culture   Final    NO GROWTH 1 DAY Performed at [facility], [address] [place], [address] [place]    Report Status PENDING  Incomplete  Resp Panel by RT-PCR (Flu A&B, Covid) Nasopharyngeal Swab     Status: None   Collection Time: 01/06/21  4:37 AM   Specimen: Nasopharyngeal Swab; Nasopharyngeal(NP) swabs in vial transport medium   Result Value Ref Range Status   SARS Coronavirus 2 by RT PCR NEGATIVE NEGATIVE Final    Comment: (NOTE) SARS-CoV-2 target nucleic acids are NOT DETECTED.  The SARS-CoV-2 RNA is generally detectable in upper respiratory specimens during the acute phase of infection. The lowest concentration of SARS-CoV-2 viral copies this assay can detect is 138 copies/mL. A negative result does not preclude SARS-Cov-2 infection and should not be used as the sole basis for treatment or other patient management decisions. A negative result may occur with  improper specimen collection/handling, submission of specimen other than nasopharyngeal swab, presence of viral mutation(s) within the areas targeted by this assay, and inadequate number of viral copies(<138 copies/mL). A negative result must be combined with clinical observations, patient history, and epidemiological information. The expected result is Negative.  Fact Sheet for Patients:  60454  Fact Sheet for Healthcare Providers:  03/08/21  This test is no t yet approved or cleared by the [employer] FDA and  has been authorized for detection and/or diagnosis of SARS-CoV-2 by FDA under an Emergency Use Authorization (EUA). This EUA will remain  in effect (meaning this test can be used) for the duration of the COVID-19 declaration under Section 564(b)(1) of the Act, 21 U.S.C.section 360bbb-3(b)(1), unless the authorization is terminated  or revoked sooner.  Influenza A by PCR NEGATIVE NEGATIVE Final   Influenza B by PCR NEGATIVE NEGATIVE Final    Comment: (NOTE) The Xpert Xpress SARS-CoV-2/FLU/RSV plus assay is intended as an aid in the diagnosis of influenza from Nasopharyngeal swab specimens and should not be used as a sole basis for treatment. Nasal washings and aspirates are unacceptable for Xpert Xpress SARS-CoV-2/FLU/RSV testing.  Fact Sheet for  Patients: [employer]  Fact Sheet for Healthcare Providers: [employer]ious[employer]ker[employer]it  This test is not yet approved or cleared by the [place] FDA and has been authorized for detection and/or diagnosis of SARS-CoV-2 by FDA under an Emergency Use Authorization (EUA). This EUA will remain in effect (meaning this test can be used) for the duration of the COVID-19 declaration under Section 564(b)(1) of the Act, 21 U.S.C. section 360bbb-3(b)(1), unless the authorization is terminated or revoked.  Performed at [facility], [address] [place], [place] [address]       Radiology Studies: DG Chest Port 1 View  Result Date: 01/06/2021 CLINICAL DATA:  Questionable sepsis EXAM: PORTABLE CHEST 1 VIEW COMPARISON:  12/10/2020 FINDINGS: Low volume chest although better than before. Streaky/Reticular densities at the bases which are also improved. Unremarkable heart size and mediastinal contours when allowing for marked distortion from rotation. No visible effusion or pneumothorax. IMPRESSION: 1. Limited low volume chest. 2. Probable atelectasis at the bases. Aeration is improved from the most recent comparison in February. Electronically Signed   By: Marnee Spring M.D.   On: 01/06/2021 04:44     Scheduled Meds: . free water  200 mL Per Tube Q6H  . lactulose  20 g Per Tube BID  . levothyroxine  125 mcg Per Tube QAC breakfast  . nystatin ointment  1 application Topical BID  . pantoprazole  40 mg Intravenous Q12H  . rifaximin  550 mg Per Tube BID  . thiamine  100 mg Per Tube Daily   Continuous Infusions: . sodium chloride Stopped (01/07/21 0844)  . cefTRIAXone (ROCEPHIN)  IV 2 g (01/06/21 2353)  . DAPTOmycin (CUBICIN)  IV 560 mg (01/06/21 2252)  . dextrose 5 % and 0.45% NaCl 50 mL/hr at 01/07/21 0844     LOS: 1 day   Time Spent in minutes   45 minutes  Zondra Lawlor D.O. on 01/07/2021 at 10:30 AM  Between 7am to 7pm -  Please see pager noted on [email or web address]  After 7pm go to [email or web address]  And look for the night coverage person covering for me after hours  [facility] Office  [phone]

## 2021-01-07 NOTE — Progress Notes (Addendum)
[facility] Progress Note  CC:  Anemia, + FOBT  Subjective: Patient is more somnolent today.  Eyes are closed.  She does not follow commands.  She is not conversant.  Bilateral wrist restraints in use.  Spoke to the patient's nurse Kaylee King verified the patient did not receive any pain medication or sedatives today.  No bowel movement today.  No family at the bedside.  Objective:  Vital signs in last 24 hours: Temp:  [98 F (36.7 C)-98.9 F (37.2 C)] 98.3 F (36.8 C) (03/06 0836) Pulse Rate:  [62-74] 62 (03/06 0836) Resp:  [14-25] 19 (03/06 0836) BP: (88-126)/(40-88) 118/61 (03/06 0836) SpO2:  [95 %-100 %] 99 % (03/06 0836) Weight:  [69.9 kg-70.3 kg] 69.9 kg (03/06 0500) Last BM Date: 01/05/21   General: Patient is difficult to arouse.  She briefly opened eyes when name called.  She is in no acute distress. Eyes: PERRLA.  No scleral icterus. Mouth: small area of dried blood right lower lip.  Heart: Regular rate and rhythm, no murmurs. Pulm: Very diminished breath sounds throughout. Abdomen: Soft, nondistended.  No obvious tenderness, rebound or guarding.  PEG tube site intact.  Positive bowel sounds to 4 quadrants.  Questionable small amount of ascites. Extremities:  Without edema. Neurologic: She is somnolent, difficult to arouse.  She is moving all extremities.  She does not follow commands.  She is nonconversant. Psych:  Alert and cooperative. Normal mood and affect.   Lab Results: Recent Labs    01/06/21 0332 01/06/21 1211 01/06/21 1638 01/06/21 2229 01/07/21 0300  WBC 16.7*  --   --   --  8.2  HGB 7.7*   < > 7.1* 7.7* 8.4*  HCT 26.5*   < > 23.2* 24.4* 27.8*  PLT 498*  --   --   --  409*   < > = values in this interval not displayed.   BMET Recent Labs    01/06/21 0332 01/07/21 0300  NA 133* 137  K 5.2* 5.1  CL 101 106  CO2 22 21*  GLUCOSE 191* 80  BUN 26* 21  CREATININE 0.97 0.95  CALCIUM 8.4* 8.5*   LFT Recent Labs    01/07/21 0300   PROT 6.5  ALBUMIN 2.1*  AST 52*  ALT 42  ALKPHOS 211*  BILITOT 1.0   PT/INR Recent Labs    01/06/21 0332 01/06/21 0503  LABPROT 13.7 14.1  INR 1.1 1.1    DG Chest Port 1 View  Result Date: 01/06/2021 CLINICAL DATA:  Questionable sepsis EXAM: PORTABLE CHEST 1 VIEW COMPARISON:  12/10/2020 FINDINGS: Low volume chest although better than before. Streaky/Reticular densities at the bases which are also improved. Unremarkable heart size and mediastinal contours when allowing for marked distortion from rotation. No visible effusion or pneumothorax. IMPRESSION: 1. Limited low volume chest. 2. Probable atelectasis at the bases. Aeration is improved from the most recent comparison in February. Electronically Signed   By: Monte Fantasia M.D.   On: 01/06/2021 04:44   [place] no ascites  Assessment / Plan:  Kaylee King is a 80 y.o. 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  63.  80 year old female with a history of Kaylee King cirrhosis previously admitted to the hospital 11/23/2020 with an UGI bleeding secondary to esophageal varices and anemia.  She received 6 units of PRBCs during that admission and s/p EGD with 6 bands placed 11/23/2020. She presented to the ED 3/5 with altered mental status.  Hemoglobin 7.7 (Hg was 7.8 at the time of her prior hospital discharge 12/19/2020).  Small amount of dried blood around her lips which the ED RN reported came from cracks in her lips and not hematemesis. Patient passed a large amount of golden brown soft stool while in the ED.  FOBT positive.  Rectal exam was negative for blood/melena.  No BM thus far today. Abdominal sonogram to assess for ascites was completed earlier this morning, results pending. Today Hg 8.4.  Alk phos 211. T. Bili 1.0. AST 52. ALT 42.  -Paracentesis with labs if enough ascites to tap, await abd sono results NO ascites CEG -Continue Pantoprazole 59m IV QD -Continue to monitor patient for active GI bleeding -Await tube feeding recommendations from RD -Further recommendations per Dr. GCarlean Purl 2.  Sepsis suspected due to UTI. WBC 16.7. She  is afebrile.  Urine and blood cultures pending. Chest x-ray without acute process.  On IV Vanco and Zosyn   3.  Metabolic encephalopathy secondary to suspected UTI/sepsi +/- hepatic encephalopathy.  Ammonia level 29 on 3/5. On Xifaxan and Lactulose.  She appears more somnolent today.  She is not conversant.  She does not follow commands at this time.  Soft wrist restraints are secured bilaterally.  I spoke to her dShamrockwho verified the patient has not received any pain medication or sedatives overnight or today.  -Consider CT of the head, defer to the medical team -Repeat ammonia level in am -Continue Lactulose via G tube bid -Continue Xifaxan via G tube bid  4.  Dysphagia s/p PEG placement 12/18/2020. Swallow study with speech pathologist attempted today, patient was uncooperative, one swallow was observed which resulted in coughing therefore she remains NPO. To initiate tube feedings.   5. Acute respiratory failure, oxygen saturations 94% on 2 L nasal cannula. Chest x-ray showed probable atelectasis at the bases.  6. Hypotension. BP 98/62 at 4:51AM. Repeat BP 118/61 at 8:30AM. On IVF D51/2 NS @ 50cc/hr  7. Hypothyroidism.    LOS: 1 day   CNoralyn Pick 01/07/2021, 10:27 AM     [facility] Attending   I have taken an interval history, reviewed the chart and examined the patient. I agree with the Advanced Practitioner's note, impression and recommendations.   NASH Cirrhosis Spespsi Hx variceal bleeding (esophagogastric) and large gastric ulcers  D/W Dr. MRee Kidaand recommended CT abd pelvis as 6 diff species ID  on blood cxs so far - enterococcus and other gut organisms seen though suspect urinary source ? If she has some other gut process driving sepsis.  No sig bleeding - not a GI bleed. BUT would repeat EGD to reassess ulcers and reband varices when medically appropriate this admit  Will f/u by AM  CGatha Mayer MD, [facility] 01/07/2021 3:07 PM

## 2021-01-07 NOTE — Progress Notes (Signed)
Pharmacy Antibiotic Note  Kaylee King is a 80 y.o. female admitted on 01/06/2021 with sepsis.  Pharmacy has been consulted for daptomycin and cefepime dosing.  Plan: Daptomycin 560 mg (8 mg/kg) IV q24h  Cefepime 2g IV every 12h Monitor CBC, renal fx, cultures and clinical progress Follow up infectious disease recommendations   Temp (24hrs), Avg:98.6 F (37 C), Min:98 F (36.7 C), Max:98.9 F (37.2 C)  Recent Labs  Lab 01/06/21 0332 01/06/21 0941 01/07/21 0300  WBC 16.7*  --  8.2  CREATININE 0.97  --  0.95  LATICACIDVEN 2.2* 1.3  --     Estimated Creatinine Clearance: 43.8 mL/min (by C-G formula based on SCr of 0.95 mg/dL).    No Known Allergies  Antimicrobials this admission: Vanc 3/5 x1 Zosyn 3/5 x1 Flagyl 3/5 x1 Daptomycin 3/5> CTX 3/5 x1 Cefepime 3/6>   Dose adjustments this admission: N/A  Microbiology results: 3/5 Bcx: 1/4 e faecalis & e faecium w/ vanc resistance, strep agalactiae, proteus  3/5 Ucx: 20k GNR   Thank you for allowing pharmacy to be a part of this patient's care.  Kinnie Feil, PharmD PGY1 Acute Care Pharmacy Resident 01/07/2021 11:05 AM  Please check [email or web address] for unit specific pharmacy phone numbers.

## 2021-01-07 NOTE — Progress Notes (Signed)
Initial Nutrition Assessment  DOCUMENTATION CODES:   Not applicable  INTERVENTION:   Initiate tube feeding via G-tube: Osmolite 1.2 at 60 ml/h (1440 ml per day) Prosource TF 45 ml once daily  Provides 1768 kcal, 91 gm protein, 1181 ml free water daily  Free water flushes 200 ml QID for a total of 1981 ml free water daily.  MVI with minerals via tube daily.  NUTRITION DIAGNOSIS:   Inadequate oral intake related to dysphagia as evidenced by NPO status.  GOAL:   Patient will meet greater than or equal to 90% of their needs  MONITOR:   TF tolerance,Diet advancement,Labs,Skin  REASON FOR ASSESSMENT:   Consult Enteral/tube feeding initiation and management  ASSESSMENT:   80 yo female admitted with severe sepsis 2/2 UTI. PMH includes hypothyroidism, NASH cirrhosis, portal HTN, GI bleed, esophageal/gastric varices, dysphagia, PEG, COPD.   S/P abdominal ultrasound this morning to look for ascites. Results not yet available.  Received MD Consult for TF initiation and management. G-tube placed last admission on 2/14 and patient was receiving Osmolite 1.2 tube feeding at discharge. TF currently on hold, but receiving 200 ml free water flushes QID. Concern that patient was dehydrated on admission. Unsure if patient was receiving any TF via PEG PTA. Home med list states "patient not taking" and "discontinued by provider" beside Osmolite 1.2 and Prosource TF. Unsure of last administration. Unsure if patient was taking POs PTA.   S/P bedside swallow evaluation with SLP this morning with recommendation to continue NPO status. Patient has a cognitive-based dysphagia and was unable to follow commands for full SLP evaluation.   Last admission, patient weighed 75.7 kg on 12/16/19. Currently 69.9 kg 8% weight loss within one month is severe, however, could be related to dehydration on admission. RD to monitor weight trend.  Labs reviewed.  CBG: 68-81  Medications reviewed and  include lactulose, Protonix, thiamine. IVF: D5 1/2 NS at 50 ml/h.  NUTRITION - FOCUSED PHYSICAL EXAM:  unable to complete  Diet Order:   Diet Order            Diet NPO time specified  Diet effective now                 EDUCATION NEEDS:   Not appropriate for education at this time  Skin:  Skin Assessment: Skin Integrity Issues: (stage II PI to sacrum and elbow, and perineal MASD last admission)  Last BM:  3/4  Height:   Ht Readings from Last 1 Encounters:  11/23/20 5\' 4"  (1.626 m)    Weight:   Wt Readings from Last 1 Encounters:  01/07/21 69.9 kg    Ideal Body Weight:  54.5 kg  BMI:  Body mass index is 26.45 kg/m.  Estimated Nutritional Needs:   Kcal:  1600-1800  Protein:  85-100 gm  Fluid:  >/= 1.6 L    03/09/21, RD, LDN, CNSC Please refer to Amion for contact information.

## 2021-01-07 NOTE — Evaluation (Addendum)
Clinical/Bedside Swallow Evaluation Patient Details  Name: Kaylee King MRN: [id number] Date of Birth: 07-04-1941  Today's Date: 01/07/2021 Time: SLP Start Time (ACUTE ONLY): 0903 SLP Stop Time (ACUTE ONLY): 0919 SLP Time Calculation (min) (ACUTE ONLY): 16 min  Past Medical History:  Past Medical History:  Diagnosis Date  . Cirrhosis (HCC)   . Thyroid disease    hypothyroid    Past Surgical History:  Past Surgical History:  Procedure Laterality Date  . ESOPHAGEAL BANDING  11/23/2020   Procedure: ESOPHAGEAL BANDING;  Surgeon: Beverley Fiedler, MD;  Location: [facility] ENDOSCOPY;  Service: Gastroenterology;;  . ESOPHAGOGASTRODUODENOSCOPY N/A 11/23/2020   Procedure: ESOPHAGOGASTRODUODENOSCOPY (EGD);  Surgeon: Beverley Fiedler, MD;  Location: [facility] ENDOSCOPY;  Service: Gastroenterology;  Laterality: N/A;  . IR GASTROSTOMY TUBE MOD SED  12/18/2020  . RADIOLOGY WITH ANESTHESIA N/A 12/12/2020   Procedure: MRI WITH ANESTHESIA;  Surgeon: Radiologist, Medication, MD;  Location: [facility] OR;  Service: Radiology;  Laterality: N/A;   HPI:  Kaylee King is a 80 y.o. 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  CXR was remarkable for "Probable atelectasis at the bases." G-tube was placed on 12/18/20.  Most recent BSE on 12/01/20 with recommendations for NPO, progressive to D2/thin during that admission.   Assessment / Plan / Recommendation Clinical Impression  Pt was seen for a bedside swallow evaluation and she presents with a cognitive-based dysphagia.  Pt was encountered awake/alert in bed with soft wrist restraints applied bilaterally.  Pt was noted to have dried blood on her R labial surface, SLP attempted to clean.  Pt was unable to follow commands to complete an oral mechanism examination, and she mostly refused PO trials on this date via turning her head away and creating a  tight labial seal.  Upon further evaluation, pt's lips appeared to be adhered together on the L side (likely secondary to dryness).  Oral gel was applied to this area, but pt refused additional intervention.  RN was made aware.  Unable to fully evaluate oral cavity.   Pt briefly accepted a small ice chip into her oral cavity, but she did not attempt lingual manipulation and instead allowed the bolus to spill anteriorly past her bilabial borders.  Brief oral care was completed; however, it was limited secondary to labial seal.  One spontaneous swallow of the pt's secretions was observed during this session followed by an immediate cough.  Recommend continuation of NPO at this time with initiation of alternative nutrition via G-tube.  SLP will f/u for PO trials to determine readiness for PO diet initiation.  SLP Visit Diagnosis: Dysphagia, unspecified (R13.10)    Aspiration Risk  Mild aspiration risk    Diet Recommendation NPO;Other (Comment) (Alternative means of nutrition via her G-tube)   Medication Administration: Via alternative means    Other  Recommendations Oral Care Recommendations: Oral care QID   Follow up Recommendations Skilled Nursing facility      Frequency and Duration min 2x/week  2 weeks       Prognosis Prognosis for Safe Diet Advancement: Fair Barriers to Reach Goals: Cognitive deficits      Swallow Study   General Date of Onset: 01/06/21 HPI: Kaylee King is a 80 y.o. 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  CXR was remarkable  for "Probable atelectasis at the bases." G-tube was placed on 12/18/20.  Most recent BSE on 12/01/20 with recommendations for NPO, progressive to D2/thin during that admission. Type of Study: Bedside Swallow Evaluation Previous Swallow Assessment: See HPI Diet Prior to this Study: NPO Temperature  Spikes Noted: No Respiratory Status: Nasal cannula History of Recent Intubation: No Behavior/Cognition: Alert;Doesn't follow directions;Distractible;Confused Oral Cavity Assessment: Other (comment) (Unable to assess) Oral Care Completed by SLP: Other (Comment) (Attempted) Oral Cavity - Dentition: Edentulous Patient Positioning: Upright in bed Baseline Vocal Quality: Not observed Volitional Cough: Cognitively unable to elicit Volitional Swallow: Unable to elicit    Oral/Motor/Sensory Function Overall Oral Motor/Sensory Function: Other (comment) (Pt unable to follow commands)   Ice Chips Ice chips: Impaired Presentation: Spoon Oral Phase Functional Implications: Oral holding   Thin Liquid Thin Liquid: Not tested    Nectar Thick Nectar Thick Liquid: Not tested   Honey Thick Honey Thick Liquid: Not tested   Puree Puree: Not tested   Solid     Solid: Not tested     Villa Herb M.S., CCC-SLP Acute Rehabilitation Services Office: [phone]  Shanon Rosser Mohamed Port[place] 01/07/2021,9:28 AM

## 2021-01-08 ENCOUNTER — Encounter (HOSPITAL_COMMUNITY): Payer: Self-pay | Admitting: Internal Medicine

## 2021-01-08 DIAGNOSIS — N390 Urinary tract infection, site not specified: Secondary | ICD-10-CM

## 2021-01-08 DIAGNOSIS — A499 Bacterial infection, unspecified: Secondary | ICD-10-CM | POA: Diagnosis not present

## 2021-01-08 DIAGNOSIS — J9602 Acute respiratory failure with hypercapnia: Secondary | ICD-10-CM

## 2021-01-08 DIAGNOSIS — A419 Sepsis, unspecified organism: Secondary | ICD-10-CM | POA: Diagnosis not present

## 2021-01-08 DIAGNOSIS — G9341 Metabolic encephalopathy: Secondary | ICD-10-CM

## 2021-01-08 DIAGNOSIS — R1310 Dysphagia, unspecified: Secondary | ICD-10-CM

## 2021-01-08 DIAGNOSIS — K6812 Psoas muscle abscess: Secondary | ICD-10-CM

## 2021-01-08 DIAGNOSIS — D649 Anemia, unspecified: Secondary | ICD-10-CM

## 2021-01-08 DIAGNOSIS — J9601 Acute respiratory failure with hypoxia: Secondary | ICD-10-CM | POA: Diagnosis not present

## 2021-01-08 HISTORY — DX: Bacterial infection, unspecified: A49.9

## 2021-01-08 LAB — CBC
HCT: 27.4 % — ABNORMAL LOW (ref 36.0–46.0)
Hemoglobin: 8.1 g/dL — ABNORMAL LOW (ref 12.0–15.0)
MCH: 28.2 pg (ref 26.0–34.0)
MCHC: 29.6 g/dL — ABNORMAL LOW (ref 30.0–36.0)
MCV: 95.5 fL (ref 80.0–100.0)
Platelets: 422 10*3/uL — ABNORMAL HIGH (ref 150–400)
RBC: 2.87 MIL/uL — ABNORMAL LOW (ref 3.87–5.11)
RDW: 17.8 % — ABNORMAL HIGH (ref 11.5–15.5)
WBC: 6.9 10*3/uL (ref 4.0–10.5)
nRBC: 1.3 % — ABNORMAL HIGH (ref 0.0–0.2)

## 2021-01-08 LAB — COMPREHENSIVE METABOLIC PANEL
ALT: 44 U/L (ref 0–44)
AST: 50 U/L — ABNORMAL HIGH (ref 15–41)
Albumin: 2.1 g/dL — ABNORMAL LOW (ref 3.5–5.0)
Alkaline Phosphatase: 206 U/L — ABNORMAL HIGH (ref 38–126)
Anion gap: 10 (ref 5–15)
BUN: 18 mg/dL (ref 8–23)
CO2: 18 mmol/L — ABNORMAL LOW (ref 22–32)
Calcium: 8.2 mg/dL — ABNORMAL LOW (ref 8.9–10.3)
Chloride: 106 mmol/L (ref 98–111)
Creatinine, Ser: 0.87 mg/dL (ref 0.44–1.00)
GFR, Estimated: >60 mL/min (ref >60)
Glucose, Bld: 126 mg/dL — ABNORMAL HIGH (ref 70–99)
Potassium: 4.2 mmol/L (ref 3.5–5.1)
Sodium: 134 mmol/L — ABNORMAL LOW (ref 135–145)
Total Bilirubin: 0.7 mg/dL (ref 0.3–1.2)
Total Protein: 6.7 g/dL (ref 6.5–8.1)

## 2021-01-08 LAB — GLUCOSE, CAPILLARY
Glucose-Capillary: 114 mg/dL — ABNORMAL HIGH (ref 70–99)
Glucose-Capillary: 124 mg/dL — ABNORMAL HIGH (ref 70–99)
Glucose-Capillary: 130 mg/dL — ABNORMAL HIGH (ref 70–99)
Glucose-Capillary: 130 mg/dL — ABNORMAL HIGH (ref 70–99)
Glucose-Capillary: 131 mg/dL — ABNORMAL HIGH (ref 70–99)

## 2021-01-08 LAB — URINE CULTURE: Culture: 20000 — AB

## 2021-01-08 LAB — AMMONIA: Ammonia: 31 umol/L (ref 9–35)

## 2021-01-08 MED ORDER — ACETAMINOPHEN 325 MG PO TABS
650.0000 mg | ORAL_TABLET | ORAL | Status: DC | PRN
  Administered 2021-01-08: 650 mg
  Filled 2021-01-08: qty 2

## 2021-01-08 MED ORDER — PANTOPRAZOLE SODIUM 40 MG PO PACK
40.0000 mg | PACK | Freq: Two times a day (BID) | ORAL | Status: DC
  Administered 2021-01-08 – 2021-01-09 (×3): 40 mg
  Filled 2021-01-08 (×4): qty 20

## 2021-01-08 MED ORDER — SODIUM CHLORIDE 0.9 % IV SOLN
INTRAVENOUS | Status: DC | PRN
  Administered 2021-01-08: 250 mL via INTRAVENOUS

## 2021-01-08 NOTE — Progress Notes (Signed)
Subjective: No new complaints   Antibiotics:  Anti-infectives (From admission, onward)   Start     Dose/Rate Route Frequency Ordered Stop   01/08/21 0500  vancomycin (VANCOREADY) IVPB 1250 mg/250 mL  Status:  Discontinued        1,250 mg 166.7 mL/hr over 90 Minutes Intravenous Every 48 hours 01/06/21 1051 01/06/21 2053   01/07/21 2200  ceFEPIme (MAXIPIME) 2 g in sodium chloride 0.9 % 100 mL IVPB        2 g 200 mL/hr over 30 Minutes Intravenous Every 12 hours 01/07/21 1105     01/06/21 2300  cefTRIAXone (ROCEPHIN) 2 g in sodium chloride 0.9 % 100 mL IVPB  Status:  Discontinued        2 g 200 mL/hr over 30 Minutes Intravenous Every 24 hours 01/06/21 2053 01/07/21 1059   01/06/21 2145  DAPTOmycin (CUBICIN) 560 mg in sodium chloride 0.9 % IVPB        560 mg 222.4 mL/hr over 30 Minutes Intravenous Daily 01/06/21 2053     01/06/21 1400  piperacillin-tazobactam (ZOSYN) IVPB 3.375 g  Status:  Discontinued       "Followed by" Linked Group Details   3.375 g 12.5 mL/hr over 240 Minutes Intravenous Every 8 hours 01/06/21 0836 01/06/21 2053   01/06/21 1030  rifaximin (XIFAXAN) tablet 550 mg        550 mg Per Tube 2 times daily 01/06/21 0910     01/06/21 0845  piperacillin-tazobactam (ZOSYN) IVPB 3.375 g       "Followed by" Linked Group Details   3.375 g 100 mL/hr over 30 Minutes Intravenous  Once 01/06/21 0836 01/06/21 1010   01/06/21 0830  piperacillin-tazobactam (ZOSYN) IVPB 3.375 g  Status:  Discontinued        3.375 g 100 mL/hr over 30 Minutes Intravenous Every 8 hours 01/06/21 0826 01/06/21 0836   01/06/21 0345  ceFEPIme (MAXIPIME) 2 g in sodium chloride 0.9 % 100 mL IVPB        2 g 200 mL/hr over 30 Minutes Intravenous  Once 01/06/21 0332 01/06/21 0435   01/06/21 0345  metroNIDAZOLE (FLAGYL) IVPB 500 mg        500 mg 100 mL/hr over 60 Minutes Intravenous  Once 01/06/21 0332 01/06/21 0753   01/06/21 0345  vancomycin (VANCOREADY) IVPB 1000 mg/200 mL  Status:  Discontinued         1,000 mg 200 mL/hr over 60 Minutes Intravenous  Once 01/06/21 0332 01/06/21 0341   01/06/21 0345  vancomycin (VANCOREADY) IVPB 1500 mg/300 mL        1,500 mg 150 mL/hr over 120 Minutes Intravenous  Once 01/06/21 0341 01/06/21 0643      Medications: Scheduled Meds: . feeding supplement (PROSource TF)  45 mL Per Tube Daily  . free water  200 mL Per Tube Q6H  . lactulose  20 g Per Tube BID  . levothyroxine  125 mcg Per Tube QAC breakfast  . multivitamin with minerals  1 tablet Per Tube Daily  . nystatin ointment  1 application Topical BID  . pantoprazole sodium  40 mg Per Tube BID  . rifaximin  550 mg Per Tube BID  . thiamine  100 mg Per Tube Daily   Continuous Infusions: . ceFEPime (MAXIPIME) IV 2 g (01/08/21 0916)  . DAPTOmycin (CUBICIN)  IV 560 mg (01/07/21 2110)  . dextrose 5 % and 0.45% NaCl 50 mL/hr at 01/07/21 0844  . feeding supplement (  OSMOLITE 1.2 CAL) 1,000 mL (01/07/21 1213)   PRN Meds:.albuterol, morphine    Objective: Weight change: 1.8 kg  Intake/Output Summary (Last 24 hours) at 01/08/2021 1208 Last data filed at 01/08/2021 0919 Gross per 24 hour  Intake 2282.33 ml  Output 1300 ml  Net 982.33 ml   Blood pressure 124/79, pulse 78, temperature 98.7 F (37.1 C), temperature source Oral, resp. rate 20, height  (1.626 m), weight 71.7 kg, SpO2 93 %. Temp:  [98.6 F (37 C)-98.7 F (37.1 C)] 98.7 F (37.1 C) (03/07 1142) Pulse Rate:  [76-78] 78 (03/07 0800) Resp:  [17-20] 20 (03/07 1142) BP: (109-129)/(55-79) 124/79 (03/07 1142) SpO2:  [93 %-98 %] 93 % (03/07 1151) Weight:  [71.7 kg] 71.7 kg (03/07 0127)  Physical Exam: Physical Exam Vitals reviewed.  HENT:     Head: Normocephalic.  Eyes:     Extraocular Movements: Extraocular movements intact.  Cardiovascular:     Rate and Rhythm: Tachycardia present.  Pulmonary:     Effort: No respiratory distress.     Breath sounds: No wheezing.  Abdominal:     General: There is no distension.      Palpations: There is no mass.  Skin:    Coloration: Skin is pale.  Neurological:     General: No focal deficit present.     Mental Status: She is alert.  Psychiatric:        Mood and Affect: Mood normal.        Speech: Speech is delayed.        Behavior: Behavior is cooperative.        Cognition and Memory: Cognition is impaired. Memory is impaired.      CBC:    BMET Recent Labs    01/07/21 0300 01/08/21 0432  NA 137 134*  K 5.1 4.2  CL 106 106  CO2 21* 18*  GLUCOSE 80 126*  BUN 21 18  CREATININE 0.95 0.87  CALCIUM 8.5* 8.2*     Liver Panel  Recent Labs    01/07/21 0300 01/08/21 0432  PROT 6.5 6.7  ALBUMIN 2.1* 2.1*  AST 52* 50*  ALT 42 44  ALKPHOS 211* 206*  BILITOT 1.0 0.7       Sedimentation Rate No results for input(s): ESRSEDRATE in the last 72 hours. C-Reactive Protein No results for input(s): CRP in the last 72 hours.  Micro Results: Recent Results (from the past 720 hour(s))  Resp Panel by RT-PCR (Flu A&B, Covid) Nasopharyngeal Swab     Status: None   Collection Time: 12/19/20  8:56 AM   Specimen: Nasopharyngeal Swab; Nasopharyngeal(NP) swabs in vial transport medium  Result Value Ref Range Status   SARS Coronavirus 2 by RT PCR NEGATIVE NEGATIVE Final    Comment: (NOTE) SARS-CoV-2 target nucleic acids are NOT DETECTED.  The SARS-CoV-2 RNA is generally detectable in upper respiratory specimens during the acute phase of infection. The lowest concentration of SARS-CoV-2 viral copies this assay can detect is 138 copies/mL. A negative result does not preclude SARS-Cov-2 infection and should not be used as the sole basis for treatment or other patient management decisions. A negative result may occur with  improper specimen collection/handling, submission of specimen other than nasopharyngeal swab, presence of viral mutation(s) within the areas targeted by this assay, and inadequate number of viral copies(<138 copies/mL). A negative result  must be combined with clinical observations, patient history, and epidemiological information. The expected result is Negative.  Fact Sheet for Patients:  BloggerCourse.com  Fact Sheet for Healthcare Providers:  SeriousBroker.it  This test is no t yet approved or cleared by the [place] FDA and  has been authorized for detection and/or diagnosis of SARS-CoV-2 by FDA under an Emergency Use Authorization (EUA). This EUA will remain  in effect (meaning this test can be used) for the duration of the COVID-19 declaration under Section 564(b)(1) of the Act, 21 U.S.C.section 360bbb-3(b)(1), unless the authorization is terminated  or revoked sooner.       Influenza A by PCR NEGATIVE NEGATIVE Final   Influenza B by PCR NEGATIVE NEGATIVE Final    Comment: (NOTE) The Xpert Xpress SARS-CoV-2/FLU/RSV plus assay is intended as an aid in the diagnosis of influenza from Nasopharyngeal swab specimens and should not be used as a sole basis for treatment. Nasal washings and aspirates are unacceptable for Xpert Xpress SARS-CoV-2/FLU/RSV testing.  Fact Sheet for Patients: BloggerCourse.com  Fact Sheet for Healthcare Providers: SeriousBroker.it  This test is not yet approved or cleared by the [place] FDA and has been authorized for detection and/or diagnosis of SARS-CoV-2 by FDA under an Emergency Use Authorization (EUA). This EUA will remain in effect (meaning this test can be used) for the duration of the COVID-19 declaration under Section 564(b)(1) of the Act, 21 U.S.C. section 360bbb-3(b)(1), unless the authorization is terminated or revoked.  Performed at [facility], [address] [place], [place] [address]   Urine culture     Status: Abnormal   Collection Time: 01/06/21  3:32 AM   Specimen: In/Out Cath Urine  Result Value Ref Range Status   Specimen Description  IN/OUT CATH URINE  Final   Special Requests   Final    NONE Performed at [facility], [address], [place], [place] [address]    Culture 20,000 COLONIES/mL PSEUDOMONAS AERUGINOSA (A)  Final   Report Status 01/08/2021 FINAL  Final   Organism ID, Bacteria PSEUDOMONAS AERUGINOSA (A)  Final      Susceptibility   Pseudomonas aeruginosa - MIC*    CEFTAZIDIME >=64 RESISTANT Resistant     CIPROFLOXACIN <=0.25 SENSITIVE Sensitive     GENTAMICIN 2 SENSITIVE Sensitive     IMIPENEM 2 SENSITIVE Sensitive     * 20,000 COLONIES/mL PSEUDOMONAS AERUGINOSA  Blood Culture (routine x 2)     Status: Abnormal (Preliminary result)   Collection Time: 01/06/21  3:50 AM   Specimen: BLOOD  Result Value Ref Range Status   Specimen Description BLOOD SITE NOT SPECIFIED  Final   Special Requests   Final    BOTTLES DRAWN AEROBIC AND ANAEROBIC Blood Culture results may not be optimal due to an inadequate volume of blood received in culture bottles   Culture  Setup Time   Final    GRAM POSITIVE COCCI IN CHAINS IN PAIRS GRAM NEGATIVE RODS ANAEROBIC BOTTLE ONLY CRITICAL RESULT CALLED TO, READ BACK BY AND VERIFIED WITH: Malva Cogan PHARMD  01/06/21 EB    Culture (A)  Final    PROTEUS MIRABILIS GRAM POSITIVE COCCI CULTURE REINCUBATED FOR BETTER GROWTH Performed at [facility], [address], [place], [place] [address]    Report Status PENDING  Incomplete  Blood Culture ID Panel (Reflexed)     Status: Abnormal   Collection Time: 01/06/21  3:50 AM  Result Value Ref Range Status   Enterococcus faecalis DETECTED (A) NOT DETECTED Final    Comment: CRITICAL RESULT CALLED TO, READ BACK BY AND VERIFIED WITH: KIM HURTH PHARMD  01/06/21 EB  Enterococcus Faecium DETECTED (A) NOT DETECTED Final    Comment: CRITICAL RESULT CALLED TO, READ BACK BY AND VERIFIED WITH: Malva Cogan PHARMD @1910  01/06/21 EB    Listeria monocytogenes NOT DETECTED NOT DETECTED Final   Staphylococcus species NOT DETECTED NOT  DETECTED Final   Staphylococcus aureus (BCID) NOT DETECTED NOT DETECTED Final   Staphylococcus epidermidis NOT DETECTED NOT DETECTED Final   Staphylococcus lugdunensis NOT DETECTED NOT DETECTED Final   Streptococcus species DETECTED (A) NOT DETECTED Final    Comment: CRITICAL RESULT CALLED TO, READ BACK BY AND VERIFIED WITH: KIM HURTH PHARMD @1910  01/06/21 EB    Streptococcus agalactiae DETECTED (A) NOT DETECTED Final    Comment: CRITICAL RESULT CALLED TO, READ BACK BY AND VERIFIED WITH: KIM HURTH PHARMD @1910  01/06/21 EB    Streptococcus pneumoniae NOT DETECTED NOT DETECTED Final   Streptococcus pyogenes NOT DETECTED NOT DETECTED Final   A.calcoaceticus-baumannii NOT DETECTED NOT DETECTED Final   Bacteroides fragilis NOT DETECTED NOT DETECTED Final   Enterobacterales DETECTED (A) NOT DETECTED Final    Comment: Enterobacterales represent a large order of gram negative bacteria, not a single organism. CRITICAL RESULT CALLED TO, READ BACK BY AND VERIFIED WITH: KIM HURTH PHARMD @1910  01/06/21 EB    Enterobacter cloacae complex NOT DETECTED NOT DETECTED Final   Escherichia coli NOT DETECTED NOT DETECTED Final   Klebsiella aerogenes NOT DETECTED NOT DETECTED Final   Klebsiella oxytoca NOT DETECTED NOT DETECTED Final   Klebsiella pneumoniae NOT DETECTED NOT DETECTED Final   Proteus species DETECTED (A) NOT DETECTED Final    Comment: CRITICAL RESULT CALLED TO, READ BACK BY AND VERIFIED WITH: KIM HURTH PHARMD @1910  01/06/21 EB    Salmonella species NOT DETECTED NOT DETECTED Final   Serratia marcescens NOT DETECTED NOT DETECTED Final   Haemophilus influenzae NOT DETECTED NOT DETECTED Final   Neisseria meningitidis NOT DETECTED NOT DETECTED Final   Pseudomonas aeruginosa NOT DETECTED NOT DETECTED Final   Stenotrophomonas maltophilia NOT DETECTED NOT DETECTED Final   Candida albicans NOT DETECTED NOT DETECTED Final   Candida auris NOT DETECTED NOT DETECTED Final   Candida glabrata NOT DETECTED NOT  DETECTED Final   Candida krusei NOT DETECTED NOT DETECTED Final   Candida parapsilosis NOT DETECTED NOT DETECTED Final   Candida tropicalis NOT DETECTED NOT DETECTED Final   Cryptococcus neoformans/gattii NOT DETECTED NOT DETECTED Final   CTX-M ESBL NOT DETECTED NOT DETECTED Final   Carbapenem resistance IMP NOT DETECTED NOT DETECTED Final   Carbapenem resistance KPC NOT DETECTED NOT DETECTED Final   Carbapenem resistance NDM NOT DETECTED NOT DETECTED Final   Carbapenem resist OXA 48 LIKE NOT DETECTED NOT DETECTED Final   Vancomycin resistance DETECTED (A) NOT DETECTED Final    Comment: CRITICAL RESULT CALLED TO, READ BACK BY AND VERIFIED WITH: KIM HURTH PHARMD @1910  01/06/21 EB    Carbapenem resistance VIM NOT DETECTED NOT DETECTED Final    Comment: Performed at [facility], [address] [place], 03/08/21  Blood Culture (routine x 2)     Status: None (Preliminary result)   Collection Time: 01/06/21  3:52 AM   Specimen: BLOOD  Result Value Ref Range Status   Specimen Description BLOOD SITE NOT SPECIFIED  Final   Special Requests   Final    BOTTLES DRAWN AEROBIC AND ANAEROBIC Blood Culture adequate volume   Culture   Final    NO GROWTH 2 DAYS Performed at [facility], [address] [place], [place]  [id number]    Report Status PENDING  Incomplete  Resp Panel by RT-PCR (Flu A&B, Covid) Nasopharyngeal Swab     Status: None   Collection Time: 01/06/21  4:37 AM   Specimen: Nasopharyngeal Swab; Nasopharyngeal(NP) swabs in vial transport medium  Result Value Ref Range Status   SARS Coronavirus 2 by RT PCR NEGATIVE NEGATIVE Final    Comment: (NOTE) SARS-CoV-2 target nucleic acids are NOT DETECTED.  The SARS-CoV-2 RNA is generally detectable in upper respiratory specimens during the acute phase of infection. The lowest concentration of SARS-CoV-2 viral copies this assay can detect is 138 copies/mL. A negative result does not preclude SARS-Cov-2 infection and  should not be used as the sole basis for treatment or other patient management decisions. A negative result may occur with  improper specimen collection/handling, submission of specimen other than nasopharyngeal swab, presence of viral mutation(s) within the areas targeted by this assay, and inadequate number of viral copies(<138 copies/mL). A negative result must be combined with clinical observations, patient history, and epidemiological information. The expected result is Negative.  Fact Sheet for Patients:  BloggerCourse.comhttps://www.fda.gov/media/152166/download  Fact Sheet for Healthcare Providers:  SeriousBroker.ithttps://www.fda.gov/media/152162/download  This test is no t yet approved or cleared by the [place] FDA and  has been authorized for detection and/or diagnosis of SARS-CoV-2 by FDA under an Emergency Use Authorization (EUA). This EUA will remain  in effect (meaning this test can be used) for the duration of the COVID-19 declaration under Section 564(b)(1) of the Act, 21 U.S.C.section 360bbb-3(b)(1), unless the authorization is terminated  or revoked sooner.       Influenza A by PCR NEGATIVE NEGATIVE Final   Influenza B by PCR NEGATIVE NEGATIVE Final    Comment: (NOTE) The Xpert Xpress SARS-CoV-2/FLU/RSV plus assay is intended as an aid in the diagnosis of influenza from Nasopharyngeal swab specimens and should not be used as a sole basis for treatment. Nasal washings and aspirates are unacceptable for Xpert Xpress SARS-CoV-2/FLU/RSV testing.  Fact Sheet for Patients: BloggerCourse.comhttps://www.fda.gov/media/152166/download  Fact Sheet for Healthcare Providers: SeriousBroker.ithttps://www.fda.gov/media/152162/download  This test is not yet approved or cleared by the [place] FDA and has been authorized for detection and/or diagnosis of SARS-CoV-2 by FDA under an Emergency Use Authorization (EUA). This EUA will remain in effect (meaning this test can be used) for the duration of the COVID-19 declaration  under Section 564(b)(1) of the Act, 21 U.S.C. section 360bbb-3(b)(1), unless the authorization is terminated or revoked.  Performed at [facility], [address] [place], [place] [address]   Culture, blood (routine x 2)     Status: None (Preliminary result)   Collection Time: 01/07/21 12:14 PM   Specimen: BLOOD  Result Value Ref Range Status   Specimen Description BLOOD SITE NOT SPECIFIED  Final   Special Requests   Final    BOTTLES DRAWN AEROBIC ONLY Blood Culture results may not be optimal due to an inadequate volume of blood received in culture bottles   Culture   Final    NO GROWTH < 24 HOURS Performed at [facility], [address] [place], [place] [id number]    Report Status PENDING  Incomplete  Culture, blood (routine x 2)     Status: None (Preliminary result)   Collection Time: 01/07/21 12:14 PM   Specimen: BLOOD  Result Value Ref Range Status   Specimen Description BLOOD SITE NOT SPECIFIED  Final   Special Requests   Final    BOTTLES DRAWN AEROBIC ONLY Blood  Culture results may not be optimal due to an inadequate volume of blood received in culture bottles   Culture   Final    NO GROWTH < 24 HOURS Performed at [facility], [address], [place], [place] [address]    Report Status PENDING  Incomplete    Studies/Results: CT ANGIO CHEST PE W OR WO CONTRAST  Result Date: 01/07/2021 CLINICAL DATA:  Hypoxemia and bacteremia EXAM: CT ANGIOGRAPHY CHEST CT ABDOMEN AND PELVIS WITH CONTRAST TECHNIQUE: Multidetector CT imaging of the chest was performed using the standard protocol during bolus administration of intravenous contrast. Multiplanar CT image reconstructions and MIPs were obtained to evaluate the vascular anatomy. Multidetector CT imaging of the abdomen and pelvis was performed using the standard protocol during bolus administration of intravenous contrast. CONTRAST:  99mL OMNIPAQUE IOHEXOL 350 MG/ML SOLN COMPARISON:  Ultrasound 01/07/2021  radiograph 01/06/2021 CT 1222 FINDINGS: CTA CHEST FINDINGS Cardiovascular: Satisfactory opacification of pulmonary arteries. No visible pulmonary arterial filling defects though respiratory motion may limit detection of smaller segmental and subsegmental filling defects. Central pulmonary arteries are enlarged though similar to prior caliber. Cardiac size is within normal limits. No pericardial effusion. Three-vessel coronary artery atherosclerosis. The aortic root is suboptimally assessed given cardiac pulsation artifact. Atherosclerotic plaque throughout the thoracic aorta. Dilatation of the distal thoracic aortic arch to 3.6 cm, returning to a more normal caliber by the level of the diaphragmatic hiatus of 2.9 cm. Mild aortic tortuosity, can be senescent. No acute luminal abnormality of the imaged aorta within the limitations of the suboptimally opacified exam. No periaortic stranding or hemorrhage. Normal 3 vessel branching of the aortic arch with calcifications in the proximal great vessels. No major venous abnormalities. Mediastinum/Nodes: No mediastinal fluid or gas. Normal thyroid gland and thoracic inlet. No acute abnormality of the trachea or esophagus. No worrisome mediastinal, hilar or axillary adenopathy. Lungs/Pleura: Low volumes and atelectasis on water likely mild emphysematous changes. Some additional vascular redistribution, septal thickening is noted as well, can reflect mild interstitial edema. Trace pleural effusions, right greater than left, with dependent and passive atelectatic changes in the lung bases. No concerning pulmonary nodules or masses. Additional bandlike areas of opacity likely reflect scarring and or atelectasis. Musculoskeletal: Multilevel degenerative changes are present in the imaged portions of the spine. Likely remote anterior wedging deformities T10, T12, unchanged from comparison. Superimposed Schmorl's node formations and vacuum disc phenomenon. No acute fracture or or  conspicuous osseous abnormality is seen. Mild body wall edema. No worrisome chest wall mass or lesion. Benign macro calcifications seen in the breast tissues, suspect many of which are mass killer. Review of the MIP images confirms the above findings. CT ABDOMEN and PELVIS FINDINGS Hepatobiliary: No worrisome focal liver lesions. Smooth liver surface contour. Normal hepatic attenuation. Gallbladder decompressed. No pericholecystic fluid or inflammation. No visible calcified gallstone or biliary ductal dilatation. Pancreas: No pancreatic ductal dilatation or surrounding inflammatory changes. Spleen: Normal in size. No concerning splenic lesions. Adrenals/Urinary Tract: Normal adrenals. Kidneys enhance and excrete symmetrically. Bilateral extrarenal pelves. Question some mild bladder wall hyperemia and urothelial thickening which could reflect ascending infection in the appropriate clinical context. Additionally, there is a punctate radiodensity layering in the left posterolateral bladder, separate from the ureterovesicular junction, suggestive of a layering bladder calculus. Stomach/Bowel: Extensive vascular collateralization in varices formation about the distal esophagus and proximal stomach. Percutaneous gastrostomy tube in place with adequate pexy of the anterior wall of the gastric body to the anterior abdominal wall. No acute complication. Normally  seated balloon. Duodenum is unremarkable accounting for distension. No small bowel thickening or dilatation is seen. High attenuation enteric contrast media traverses through the colon to the level of the rectal vault, lack of formed stool, can be seen with rapid transit state. Scattered colonic diverticula without focal inflammation to suggest diverticulitis. 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 Atherosclerotic calcifications within the abdominal aorta and branch vessels. No other aneurysm or ectasia.  No enlarged abdominopelvic lymph nodes. Reproductive: Anteverted uterus. No concerning adnexal lesions. There is slight asymmetric prominence of the left gonadal vein and parametrial vasculature, nonspecific though can be seen in the setting of pelvic congestion. Other: Stranding and inflammatory changes appears centered upon the right psoas musculature with some additional hazy stranding distributed in the retroperitoneum. Possibly reactive change. No free air. No bowel containing hernia. Musculoskeletal: Interval contraction of the previously expanded right psoas musculature albeit with a persistent hypoattenuating focus centrally within the muscle belly (6/45, 3/53), can reflect a residual intramuscular hematoma or abscess in the setting of sepsis. No other intramuscular collection is seen. Multilevel degenerative changes are noted in the lumbar levels. Levocurvature apex L2-3. Associated pelvic tilt. Degenerative changes in the hips and pelvis as well. No acute or conspicuous osseous lesions. Review of the MIP images confirms the above findings. IMPRESSION: 1. No evidence of acute pulmonary embolism though respiratory motion may limit detection of smaller segmental and subsegmental filling defects. 2. Enlarged central pulmonary arteries, suggestive of pulmonary arterial hypertension. 3. Trace pleural effusions, right greater than left, with dependent and passive atelectatic changes in the lung bases. 4. Question some mild bladder wall hyperemia and urothelial thickening which could reflect ascending infection in the appropriate clinical context. Correlate with urinalysis. 5. Interval contraction of the previously expanded right psoas musculature albeit with a persistent hypoattenuating focus centrally within the muscle belly, can reflect a residual intramuscular hematoma or abscess in the setting of sepsis. Stranding and inflammatory changes appears centered upon the right psoas musculature with some additional  hazy stranding distributed in the retroperitoneum, which could reflect reactive change. 6. High attenuation enteric contrast media traverses through the colon to the level of the rectal vault, lack of formed stool, can be seen with rapid transit state. 7. Dilatation of the aortic arch to 3.6 cm. Recommend annual imaging followup by CTA or MRA. This recommendation follows 2010 ACCF/AHA/AATS/ACR/ASA/SCA/SCAI/SIR/STS/SVM Guidelines for the Diagnosis and Management of Patients with Thoracic Aortic Disease. Circulation.2010; 121: Z610-R604. Aortic aneurysm NOS (ICD10-I71.9) 8. 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 Recommend follow-up ultrasound every 3 years. This recommendation follows ACR consensus guidelines: White Paper of the ACR Incidental Findings Committee II on Vascular Findings. J Am Coll Radiol 2013; 10:789-794. 9. Aortic Atherosclerosis (ICD10-I70.0). 10. Coronary artery calcifications are present. Please note that the presence of coronary artery calcium documents the presence of coronary artery disease, the severity of this disease and any potential stenosis cannot be assessed on this non-gated CT examination. Electronically Signed   By: Kreg Shropshire M.D.   On: 01/07/2021 19:57   CT ABDOMEN PELVIS W CONTRAST  Result Date: 01/07/2021 CLINICAL DATA:  Hypoxemia and bacteremia EXAM: CT ANGIOGRAPHY CHEST CT ABDOMEN AND PELVIS WITH CONTRAST TECHNIQUE: Multidetector CT imaging of the chest was performed using the standard protocol during bolus administration of intravenous contrast. Multiplanar CT image reconstructions and MIPs were obtained to evaluate the vascular anatomy. Multidetector CT imaging of the abdomen and pelvis was performed using the standard protocol  during bolus administration of intravenous contrast. CONTRAST:  38mL OMNIPAQUE IOHEXOL 350 MG/ML SOLN COMPARISON:  Ultrasound 01/07/2021 radiograph 01/06/2021 CT 1222 FINDINGS: CTA CHEST FINDINGS Cardiovascular:  Satisfactory opacification of pulmonary arteries. No visible pulmonary arterial filling defects though respiratory motion may limit detection of smaller segmental and subsegmental filling defects. Central pulmonary arteries are enlarged though similar to prior caliber. Cardiac size is within normal limits. No pericardial effusion. Three-vessel coronary artery atherosclerosis. The aortic root is suboptimally assessed given cardiac pulsation artifact. Atherosclerotic plaque throughout the thoracic aorta. Dilatation of the distal thoracic aortic arch to 3.6 cm, returning to a more normal caliber by the level of the diaphragmatic hiatus of 2.9 cm. Mild aortic tortuosity, can be senescent. No acute luminal abnormality of the imaged aorta within the limitations of the suboptimally opacified exam. No periaortic stranding or hemorrhage. Normal 3 vessel branching of the aortic arch with calcifications in the proximal great vessels. No major venous abnormalities. Mediastinum/Nodes: No mediastinal fluid or gas. Normal thyroid gland and thoracic inlet. No acute abnormality of the trachea or esophagus. No worrisome mediastinal, hilar or axillary adenopathy. Lungs/Pleura: Low volumes and atelectasis on water likely mild emphysematous changes. Some additional vascular redistribution, septal thickening is noted as well, can reflect mild interstitial edema. Trace pleural effusions, right greater than left, with dependent and passive atelectatic changes in the lung bases. No concerning pulmonary nodules or masses. Additional bandlike areas of opacity likely reflect scarring and or atelectasis. Musculoskeletal: Multilevel degenerative changes are present in the imaged portions of the spine. Likely remote anterior wedging deformities T10, T12, unchanged from comparison. Superimposed Schmorl's node formations and vacuum disc phenomenon. No acute fracture or or conspicuous osseous abnormality is seen. Mild body wall edema. No worrisome  chest wall mass or lesion. Benign macro calcifications seen in the breast tissues, suspect many of which are mass killer. Review of the MIP images confirms the above findings. CT ABDOMEN and PELVIS FINDINGS Hepatobiliary: No worrisome focal liver lesions. Smooth liver surface contour. Normal hepatic attenuation. Gallbladder decompressed. No pericholecystic fluid or inflammation. No visible calcified gallstone or biliary ductal dilatation. Pancreas: No pancreatic ductal dilatation or surrounding inflammatory changes. Spleen: Normal in size. No concerning splenic lesions. Adrenals/Urinary Tract: Normal adrenals. Kidneys enhance and excrete symmetrically. Bilateral extrarenal pelves. Question some mild bladder wall hyperemia and urothelial thickening which could reflect ascending infection in the appropriate clinical context. Additionally, there is a punctate radiodensity layering in the left posterolateral bladder, separate from the ureterovesicular junction, suggestive of a layering bladder calculus. Stomach/Bowel: Extensive vascular collateralization in varices formation about the distal esophagus and proximal stomach. Percutaneous gastrostomy tube in place with adequate pexy of the anterior wall of the gastric body to the anterior abdominal wall. No acute complication. Normally seated balloon. Duodenum is unremarkable accounting for distension. No small bowel thickening or dilatation is seen. High attenuation enteric contrast media traverses through the colon to the level of the rectal vault, lack of formed stool, can be seen with rapid transit state. Scattered colonic diverticula without focal inflammation to suggest diverticulitis. 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 Atherosclerotic calcifications within the abdominal aorta and branch vessels. No other aneurysm or ectasia. No enlarged abdominopelvic lymph nodes. Reproductive: Anteverted uterus. No  concerning adnexal lesions. There is slight asymmetric prominence of the left gonadal vein and parametrial vasculature, nonspecific though can be seen in the setting of pelvic congestion. Other: Stranding and inflammatory changes appears centered upon the right psoas musculature with some  additional hazy stranding distributed in the retroperitoneum. Possibly reactive change. No free air. No bowel containing hernia. Musculoskeletal: Interval contraction of the previously expanded right psoas musculature albeit with a persistent hypoattenuating focus centrally within the muscle belly (6/45, 3/53), can reflect a residual intramuscular hematoma or abscess in the setting of sepsis. No other intramuscular collection is seen. Multilevel degenerative changes are noted in the lumbar levels. Levocurvature apex L2-3. Associated pelvic tilt. Degenerative changes in the hips and pelvis as well. No acute or conspicuous osseous lesions. Review of the MIP images confirms the above findings. IMPRESSION: 1. No evidence of acute pulmonary embolism though respiratory motion may limit detection of smaller segmental and subsegmental filling defects. 2. Enlarged central pulmonary arteries, suggestive of pulmonary arterial hypertension. 3. Trace pleural effusions, right greater than left, with dependent and passive atelectatic changes in the lung bases. 4. Question some mild bladder wall hyperemia and urothelial thickening which could reflect ascending infection in the appropriate clinical context. Correlate with urinalysis. 5. Interval contraction of the previously expanded right psoas musculature albeit with a persistent hypoattenuating focus centrally within the muscle belly, can reflect a residual intramuscular hematoma or abscess in the setting of sepsis. Stranding and inflammatory changes appears centered upon the right psoas musculature with some additional hazy stranding distributed in the retroperitoneum, which could reflect  reactive change. 6. High attenuation enteric contrast media traverses through the colon to the level of the rectal vault, lack of formed stool, can be seen with rapid transit state. 7. Dilatation of the aortic arch to 3.6 cm. Recommend annual imaging followup by CTA or MRA. This recommendation follows 2010 ACCF/AHA/AATS/ACR/ASA/SCA/SCAI/SIR/STS/SVM Guidelines for the Diagnosis and Management of Patients with Thoracic Aortic Disease. Circulation.2010; 121: K440-N027. Aortic aneurysm NOS (ICD10-I71.9) 8. 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 Recommend follow-up ultrasound every 3 years. This recommendation follows ACR consensus guidelines: White Paper of the ACR Incidental Findings Committee II on Vascular Findings. J Am Coll Radiol 2013; 10:789-794. 9. Aortic Atherosclerosis (ICD10-I70.0). 10. Coronary artery calcifications are present. Please note that the presence of coronary artery calcium documents the presence of coronary artery disease, the severity of this disease and any potential stenosis cannot be assessed on this non-gated CT examination. Electronically Signed   By: Kreg Shropshire M.D.   On: 01/07/2021 19:57   [place] ASCITES (ABDOMEN LIMITED)  Result Date: 01/07/2021 CLINICAL DATA:  Evaluate for ascites. EXAM: LIMITED ABDOMEN ULTRASOUND FOR ASCITES TECHNIQUE: Limited ultrasound survey for ascites was performed in all four abdominal quadrants. COMPARISON:  None. FINDINGS: No ascites identified today. IMPRESSION: No ascites identified. Electronically Signed   By: Gerome Sam III M.D   On: 01/07/2021 11:01      Assessment/Plan:  INTERVAL HISTORY: CT chest abdomen pelvis shows an area of possible psoas hematoma versus abscess that was previously visualized on February 8 and appears smaller   Principal Problem:   Sepsis (HCC) Active Problems:   Hypothyroid   Acute metabolic encephalopathy   GI bleed   Acute respiratory failure with hypoxia and hypercapnia  (HCC)   Acute lower UTI   Normocytic anemia   Dysphagia   PEG (percutaneous endoscopic gastrostomy) status (HCC)   Hypotension    Kaylee King is a 80 y.o. female with with multiple medical problems including NASH with cirrhosis varices GI bleed was admitted with hypoxia and concern for sepsis. One of her blood cultures is yielded multiple organisms with Enterococcus faecalis and vancomycin resistant Enterococcus VCM identified by [facility] ID. She also  had a Proteus mirabilis species identified which is growing in culture. The other blood culture was completely sterile. She has been on daptomycin with ceftriaxone which was recently changed to cefepime.  Her urine culture grew yielded only 20,000 colony-forming units of Pseudomonas aeruginosa. At first glance this culture would appear to be a contaminant given that nothing grew in one side and multiple organisms grew in the other.  She does have on imaging evidence of an abnormal area in her psoas muscle which is diminished compared to imaging on the eighth of February.  It is conceivable that she has a psoas muscle abscess that is causing her bacteremia but I don't understand why it would be getting smaller despite not receiving antimicrobial therapy for it.  For now we'll continue daptomycin and cefepime and follow-up on how she does clinically.         LOS: 2 days   Acey Lav 01/08/2021, 12:08 PM

## 2021-01-08 NOTE — Progress Notes (Signed)
Pt is currently off soft restraints. Pt has not attempted to remove lines.  Pt has stated to feel short of breath while on 2L and states feels much better while on 4L [place] with 100% oxygen saturation.  Pt is resting comfortably, denies pain at this time. Will continue to monitor.

## 2021-01-08 NOTE — Progress Notes (Addendum)
Daily Rounding Note  01/08/2021, 8:34 AM  LOS: 2 days   SUBJECTIVE:   Chief complaint:    Encephalopathy.  FOBT + anemia.   No fevers.  BP as low as 98/62.  HR wnl.  Less encephalopathic but stil somnolent.  Soft wrist restraints remain in place to protect medical apparatus. Continues on target rate tube feeds at 60 mL/hour.  Denies pain. Complaint of shortness of breath earlier this morning but this has improved with increase rate of nasal cannula oxygen.  Reported 2 BMs overnight, recording of stool frequency, appearance not well documented.  OBJECTIVE:         Vital signs in last 24 hours:    Temp:  [98.3 F (36.8 C)-98.7 F (37.1 C)] 98.7 F (37.1 C) (03/07 0400) Pulse Rate:  [62-76] 76 (03/07 0034) Resp:  [18-20] 18 (03/07 0400) BP: (112-129)/(57-68) 117/65 (03/07 0034) SpO2:  [97 %-99 %] 98 % (03/07 0400) Weight:  [71.7 kg] 71.7 kg (03/07 0127) Last BM Date: 01/07/21 Filed Weights   01/07/21 0011 01/07/21 0500 01/08/21 0127  Weight: 70.3 kg 69.9 kg 71.7 kg   General: Looks chronically and acutely unwell.  Comfortable.  Somnolent but not hard to wake her up. Heart: RRR. Chest: No labored breathing or cough.  [place] oxygen cannula in place. Abdomen: Soft, obese, benign appearing gastrostomy tube site.  No tenderness.  Bowel sounds active. Extremities: No lower extremity edema. Neuro/Psych: Oriented to "hospital", her name.  Admitted she did not know the year or what town she was in.  Appropriate.  Follows commands.  No obvious asterixis.  Lab Results: Recent Labs    01/06/21 0332 01/06/21 1211 01/06/21 2229 01/07/21 0300 01/08/21 0432  WBC 16.7*  --   --  8.2 6.9  HGB 7.7*   < > 7.7* 8.4* 8.1*  HCT 26.5*   < > 24.4* 27.8* 27.4*  PLT 498*  --   --  409* 422*   < > = values in this interval not displayed.   BMET Recent Labs    01/06/21 0332 01/07/21 0300 01/08/21 0432  NA 133* 137 134*  K 5.2* 5.1 4.2   CL 101 106 106  CO2 22 21* 18*  GLUCOSE 191* 80 126*  BUN 26* 21 18  CREATININE 0.97 0.95 0.87  CALCIUM 8.4* 8.5* 8.2*   LFT Recent Labs    01/06/21 0332 01/07/21 0300 01/08/21 0432  PROT 7.2 6.5 6.7  ALBUMIN 2.3* 2.1* 2.1*  AST 63* 52* 50*  ALT 57* 42 44  ALKPHOS 289* 211* 206*  BILITOT 1.1 1.0 0.7   PT/INR Recent Labs    01/06/21 0332 01/06/21 0503  LABPROT 13.7 14.1  INR 1.1 1.1    Scheduled Meds: . feeding supplement (PROSource TF)  45 mL Per Tube Daily  . free water  200 mL Per Tube Q6H  . lactulose  20 g Per Tube BID  . levothyroxine  125 mcg Per Tube QAC breakfast  . multivitamin with minerals  1 tablet Per Tube Daily  . nystatin ointment  1 application Topical BID  . pantoprazole  40 mg Intravenous Q12H  . rifaximin  550 mg Per Tube BID  . thiamine  100 mg Per Tube Daily   Continuous Infusions: . ceFEPime (MAXIPIME) IV 2 g (01/07/21 2200)  . DAPTOmycin (CUBICIN)  IV 560 mg (01/07/21 2110)  . dextrose 5 % and 0.45% NaCl 50 mL/hr at 01/07/21 0844  . feeding  supplement (OSMOLITE 1.2 CAL) 1,000 mL (01/07/21 1213)   PRN Meds:.albuterol, morphine   ASSESMENT:   *  11/2020 UGIB, hemorrhagic shock due to esoph varices.   11/23/20 EGD: Grade III esophageal varices. Most likely source of GI bleeding. Banded x 6.   Clotted blood in gastric fundus, removed. Non-bleeding, Type 1 gastroesophageal varices (GOV1, esophageal varices which extend along the lesser curvature).  Non-bleeding gastric ulcers x 3 with no stigmata of bleeding.  On Protonix 40 IV bid.    *   Recent blood loss anemia.  Received 6 PRBCs, 2 FFP in 11/2020.   Hgb 7.7 >> 6.7 >> 8.1.  Platelets ok/recovered this admission.   No overt melena, GIB this admission.    *   NASH cirrhosis.   Normal INR and platelets.   Chronic elevation alk phos Choledocholithiasis, liver abscesses (catheter drainage)  10/2016 ERCP >> Stent placement >> 2018 stent removal/ stone extraction.   *   Dysphagia.  IR  placed PEG on  2/14.  Remains NPO.    *   resp failure.  On [place] oxygen.    *   No ascites.    *   UTI w sepsis.  Proteus growing on 1 blood clx, 20 K pseudomonas in urine.   On maxipime, daptomycin.  WBCs normalized.   ? Is the finding in psoas hematoma or abscess, could account for  Multiple species IDd on PCR.    *   AMS, encephalopathy in setting of sepsis +/- HE.  Rifaxin, Lactulose VT in place now and PTA.  Ammonia level has never been elevated over last 2 months.     *  Hx PV thrombosis.   No blood thinners PTA.    *   Aortic arch dilation, stable abdominal aortic aneurysm.   *     Persistent hypoattenuating focus centrally within the R psoas muscle belly, can reflect a residual intramuscular hematoma or abscess in the setting of sepsis. Stranding/ inflammatory changes appears centered R psoas musculature.  Additional hazy stranding in the retroperitoneum, could reflect reactive change.      PLAN   *   Repeat EGD to reassess ulcer and band varices if needed.  Timing TBD.    *   OK to change to Protonix 40 mg via tube.   Leave Rifaximin, lactulose in place.    *  ? re-involve pall care team.  They saw her during 11/2020 admmission.     Azucena Freed  01/08/2021, 8:34 AM Phone [phone]  ________________________________________________________________________  Velora Heckler GI MD note:  I personally examined the patient, reviewed the data and agree with the assessment and plan described above.  Frail, elderly, medically complex woman whom I am meeting for the first time today.  She is not having active Gi bleeding and unless that is the case I would not repeat EGD until she is quite a bit more recovered.  I think palliative care input would be very helpful for her and her family.  Will follow along.   Owens Loffler, MD [facility] Pager [phone]

## 2021-01-08 NOTE — Progress Notes (Signed)
Pt c/o mild headache. MD paged.

## 2021-01-08 NOTE — Progress Notes (Signed)
  Speech Language Pathology Treatment: Dysphagia  Patient Details Name: Kaylee King MRN: [id number] DOB: January 03, 1941 Today's Date: 01/08/2021 Time: 3557-3220 SLP Time Calculation (min) (ACUTE ONLY): 13 min  Assessment / Plan / Recommendation Clinical Impression  Pt's mentation appears to be improving and she was more agreeable to PO intake compared to yesterday's swallowing assessment. She also is noted to have a low and breathy vocal quality consistent with description from recent admission. Pt was seen with upgraded PO trials with no overt s/s of aspiration noted; however, trials were still limited. No oral signs of dysphagia were noted with upgraded textures as well. Recommend DYS 1 diet and thin liquids with full supervision. SLP will follow up for continued upgraded PO trials and assessment of pt's oropharyngeal swallow.    HPI HPI: Kaylee King is a 80 y.o. 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  CXR was remarkable for "Probable atelectasis at the bases." G-tube was placed on 12/18/20.  Most recent BSE on 12/01/20 with recommendations for NPO, progressive to D2/thin during that admission.      SLP Plan  Continue with current plan of care       Recommendations  Diet recommendations: Thin liquid;Dysphagia 1 (puree) Liquids provided via: Cup;Straw Medication Administration: Crushed with puree Supervision: Staff to assist with self feeding;Full supervision/cueing for compensatory strategies Compensations: Slow rate;Small sips/bites;Minimize environmental distractions Postural Changes and/or Swallow Maneuvers: Seated upright 90 degrees                Oral Care Recommendations: Oral care BID Follow up Recommendations: Skilled Nursing facility SLP Visit Diagnosis: Dysphagia, unspecified (R13.10) Plan: Continue with current plan of  care       GO                Zettie Cooley., SLP Student 01/08/2021, 11:08 AM

## 2021-01-08 NOTE — Progress Notes (Signed)
PROGRESS NOTE    Kaylee King  MPN:[id number] DOB: [birth year] DOA: 01/06/2021 PCP: Fleet Contras, MD   Brief Narrative:  HPI On 01/06/2021 by Dr. Madelyn Flavors Kaylee King is a 80 y.o. 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  Patient unable to provide any significant history at this time and only tells me to stop trying to examine the patient.  Daughter notes that recently when she has visited her at [facility] patient is curled up on her side it seems due to pain.  Whenever her pain has been adequately treated she will sit up on the side of bed and is able to answer questions.  Recently the patient has had what appeared to be dried blood on her lips, but daughter had not been told of any episodes of vomiting.   Patient was found by EMS to be tachycardic and diaphoretic with O2 saturations around 85% on room air.  She was placed on nonrebreather at 10 L with improvement of O2 saturations to 93%.   Recently hospitalized from 1/20-2/15 after presenting with hemorrhagic shock due to a variceal bleed requiring 6 units PRBCs and 2 units of FFP.  EGD by GI on 1/20 showed esophageal varices which were banded and several nonbleeding gastric ulcers.  Patient was intubated for airway protection, and diagnosed with pneumonia treated with 7 days of Rocephin IV.  Patient was able to be weaned off the ventilator to 2 L of nasal cannula oxygen, but required PEG tube placement for nutrition.  Urine cultures from that hospitalization grew out E. coli and Morganella.  E. coli was sensitive to Rocephin, but sensitivities were not reported for Rocephin with Morganella.  She was discharged to [facility].  Interim history Admitted for severe sepsis suspected to be secondary to UTI, on broad spectrum antibiotics.  Patient also noted to have possible GI bleed,  gastroenterology consulted and appreciated. Assessment & Plan   Severe sepsis secondary to UTI/ ? Intramuscular hematoma or abscess vs ?bacteremia -Patient presented with tachypnea, leukocytosis and elevated lactic acid level along with acute metabolic encephalopathy -Chest x-ray unremarkable for infection. -UA positive for moderate leukocytes, rare bacteria, 21-50 WBCs -Previous hospitalization, patient had >100K of E. coli and Morganella and had received Rocephin for suspected treatment of community-acquired pneumonia -Patient initially placed on vancomycin and Zosyn -Blood culture showed GPC in chains and pairs, GNR-reflex ID detected Enterococcus faecalis, Enterococcus faecium, Streptococcus species, Streptococcus agalactiae, Enterobacterales, Proteus species, VRE -Repeat Blood culture 3/62022 show no growth to date  -transitioned  on Daptomycin and cefepime -DDimer 6.45 -Obtained CTA chest/abd/pelvis: No PE. Trace pleural effusions. 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 Mild bladder wall hyperemia. Right psoas hematoma or abscess.  -Infectious disease consulted and appreciated, agreed with continuing antibiotics for now  Acute metabolic encephalopathy -Patient was noted to be altered at her facility -May be secondary to the above with urinary tract infection versus cirrhosis -Continue to monitor and treat underlying conditions -Currently in soft restraints -Currently alert and oriented x1.  Discussed with patient's daughter via phone, patient has had some cognitive impairment but not much prior to her hospitalization in January 2022, however she has not returned to her normal baseline since that hospitalization.  Acute hypoxic respiratory failure -Initially oxygen saturations were 85% on room air and she required a nonrebreather -Continue supplemental oxygen to maintain 92% -As above chest x-ray unremarkable  for infiltrate or infection however  did show low lung volumes -Continue incentive spirometry if able  Chronic normocytic anemia/possible GI bleed -Patient with history of esophageal and gastric varices -Hemoglobin of 7.7 on admission, up to 8.1 today -FOBT was positive  -Gastroenterology consulted and appreciated -Currently holding tube feeds -Continue IV Protonix  Hypotension -Patient noted to have some soft blood pressures -Have placed her on IV fluids -Amlodipine held  Cirrhosis with portal hypertension and history of varices -As above, gastroenterology consulted and appreciated -She recently underwent EGD on 120 and had banding of 6 esophageal varices with noted nonbleeding gastric ulcers -Pending abdominal ultrasound -Continue rifaximin and lactulose  Hypothyroidism -TSH 15.113, appears improved from TSH in February 2022 -Continue Synthroid, dose increased 125 mcg daily -Repeat TSH in the outpatient setting in 4 to 6 weeks  Dysphagia -Patient has PEG placement -Speech therapy consulted and recommends continued n.p.o.  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 Daughter via phone  Disposition Plan:  Status is: Inpatient  Remains inpatient appropriate because:Altered mental status, IV treatments appropriate due to intensity of illness or inability to take PO and Inpatient level of care appropriate due to severity of illness   Dispo: The patient is from: SNF              Anticipated d/c is to: SNF               Patient currently is not medically stable to d/c.   Difficult to place patient No   Consultants Gastroenterology Infectious disease  Procedures  None  Antibiotics   Anti-infectives (From admission, onward)   Start     Dose/Rate Route Frequency Ordered Stop   01/08/21 0500  vancomycin (VANCOREADY) IVPB 1250 mg/250 mL  Status:  Discontinued        1,250 mg 166.7 mL/hr over 90 Minutes Intravenous Every 48 hours 01/06/21 1051 01/06/21 2053   01/07/21 2200  ceFEPIme (MAXIPIME) 2 g in sodium chloride 0.9 % 100 mL IVPB        2 g 200 mL/hr over 30 Minutes Intravenous Every 12 hours 01/07/21 1105     01/06/21 2300  cefTRIAXone (ROCEPHIN) 2 g in sodium chloride 0.9 % 100 mL IVPB  Status:  Discontinued        2 g 200 mL/hr over 30 Minutes Intravenous Every 24 hours 01/06/21 2053 01/07/21 1059   01/06/21 2145  DAPTOmycin (CUBICIN) 560 mg in sodium chloride 0.9 % IVPB        560 mg 222.4 mL/hr over 30 Minutes Intravenous Daily 01/06/21 2053     01/06/21 1400  piperacillin-tazobactam (ZOSYN) IVPB 3.375 g  Status:  Discontinued       "Followed by" Linked Group Details   3.375 g 12.5 mL/hr over 240 Minutes Intravenous Every 8 hours 01/06/21 0836 01/06/21 2053   01/06/21 1030  rifaximin (XIFAXAN) tablet 550 mg        550 mg Per Tube 2 times daily 01/06/21 0910     01/06/21 0845  piperacillin-tazobactam (ZOSYN) IVPB 3.375 g       "  Followed by" Linked Group Details   3.375 g 100 mL/hr over 30 Minutes Intravenous  Once 01/06/21 0836 01/06/21 1010   01/06/21 0830  piperacillin-tazobactam (ZOSYN) IVPB 3.375 g  Status:  Discontinued        3.375 g 100 mL/hr over 30 Minutes Intravenous Every 8 hours 01/06/21 0826 01/06/21 0836   01/06/21 0345  ceFEPIme (MAXIPIME) 2 g in sodium chloride 0.9 % 100 mL IVPB        2 g 200 mL/hr over 30 Minutes Intravenous  Once 01/06/21 0332 01/06/21 0435   01/06/21 0345  metroNIDAZOLE (FLAGYL) IVPB 500 mg        500 mg 100 mL/hr over 60 Minutes Intravenous  Once  01/06/21 0332 01/06/21 0753   01/06/21 0345  vancomycin (VANCOREADY) IVPB 1000 mg/200 mL  Status:  Discontinued        1,000 mg 200 mL/hr over 60 Minutes Intravenous  Once 01/06/21 0332 01/06/21 0341   01/06/21 0345  vancomycin (VANCOREADY) IVPB 1500 mg/300 mL        1,500 mg 150 mL/hr over 120 Minutes Intravenous  Once 01/06/21 0341 01/06/21 0643      Subjective:   Kaylee King seen and examined today.  Continues to be altered.  Alert and oriented to self.  Not able to answer my questions.    Objective:   Vitals:   01/08/21 0400 01/08/21 0800 01/08/21 1142 01/08/21 1151  BP:  (!) 109/55 124/79   Pulse:  78    Resp: 18 17 20    Temp: 98.7 F (37.1 C)  98.7 F (37.1 C)   TempSrc: Oral  Oral   SpO2: 98%   93%  Weight:      Height:        Intake/Output Summary (Last 24 hours) at 01/08/2021 1300 Last data filed at 01/08/2021 0919 Gross per 24 hour  Intake 2282.33 ml  Output 1300 ml  Net 982.33 ml   Filed Weights   01/07/21 0011 01/07/21 0500 01/08/21 0127  Weight: 70.3 kg 69.9 kg 71.7 kg    Exam  General: Well developed, chronically ill-appearing, NAD  HEENT: NCAT,  mucous membranes moist  Cardiovascular: S1 S2 auscultated, RRR  Respiratory: Diminished breath sounds, anteriorly  Abdomen: Soft, obese, nontender, nondistended, + bowel sounds, G-tube in place  Extremities: warm dry without cyanosis clubbing or edema  Neuro: Alert and oriented in 1 (self), not answering my questions   Data Reviewed: I have personally reviewed following labs and imaging studies  CBC: Recent Labs  Lab 01/06/21 0332 01/06/21 1211 01/06/21 1638 01/06/21 2229 01/07/21 0300 01/08/21 0432  WBC 16.7*  --   --   --  8.2 6.9  NEUTROABS 15.7*  --   --   --   --   --   HGB 7.7* 6.7* 7.1* 7.7* 8.4* 8.1*  HCT 26.5* 23.4* 23.2* 24.4* 27.8* 27.4*  MCV 97.1  --   --   --  93.9 95.5  PLT 498*  --   --   --  409* 422*   Basic Metabolic Panel: Recent Labs  Lab 01/06/21 0332  01/07/21 0300 01/08/21 0432  NA 133* 137 134*  K 5.2* 5.1 4.2  CL 101 106 106  CO2 22 21* 18*  GLUCOSE 191* 80 126*  BUN 26* 21 18  CREATININE 0.97 0.95 0.87  CALCIUM 8.4* 8.5* 8.2*   GFR: Estimated Creatinine Clearance: 48.4 mL/min (by C-G formula based on SCr of 0.87 mg/dL). Liver Function Tests: Recent Labs  Lab 01/06/21 0332 01/07/21 0300 01/08/21 0432  AST 63* 52* 50*  ALT 57* 42 44  ALKPHOS 289* 211* 206*  BILITOT 1.1 1.0 0.7  PROT 7.2 6.5 6.7  ALBUMIN 2.3* 2.1* 2.1*   No results for input(s): LIPASE, AMYLASE in the last 168 hours. Recent Labs  Lab 01/06/21 0436 01/08/21 0432  AMMONIA 29 31   Coagulation Profile: Recent Labs  Lab 01/06/21 0332 01/06/21 0503  INR 1.1 1.1   Cardiac Enzymes: Recent Labs  Lab 01/07/21 0300  CKTOTAL 34*   BNP (last 3 results) No results for input(s): PROBNP in the last 8760 hours. HbA1C: No results for input(s): HGBA1C in the last 72 hours. CBG: Recent Labs  Lab 01/07/21 1620 01/07/21 2108 01/08/21 0032 01/08/21 0438 01/08/21 1144  GLUCAP 91 95 131* 130* 130*   Lipid Profile: No results for input(s): CHOL, HDL, LDLCALC, TRIG, CHOLHDL, LDLDIRECT in the last 72 hours. Thyroid Function Tests: Recent Labs    01/06/21 0436  TSH 15.113*   Anemia Panel: No results for input(s): VITAMINB12, FOLATE, FERRITIN, TIBC, IRON, RETICCTPCT in the last 72 hours. Urine analysis:    Component Value Date/Time   COLORURINE YELLOW 01/06/2021 0621   APPEARANCEUR HAZY (A) 01/06/2021 0621   LABSPEC 1.013 01/06/2021 0621   PHURINE 6.0 01/06/2021 0621   GLUCOSEU NEGATIVE 01/06/2021 0621   HGBUR NEGATIVE 01/06/2021 0621   BILIRUBINUR NEGATIVE 01/06/2021 0621   KETONESUR NEGATIVE 01/06/2021 0621   PROTEINUR 30 (A) 01/06/2021 0621   NITRITE NEGATIVE 01/06/2021 0621   LEUKOCYTESUR MODERATE (A) 01/06/2021 0621   Sepsis Labs: @LABRCNTIP (procalcitonin:4,lacticidven:4)  ) Recent Results (from the past 240 hour(s))  Urine  culture     Status: Abnormal   Collection Time: 01/06/21  3:32 AM   Specimen: In/Out Cath Urine  Result Value Ref Range Status   Specimen Description IN/OUT CATH URINE  Final   Special Requests   Final    NONE Performed at [facility], [address], [place], [place]    Culture 20,000 COLONIES/mL PSEUDOMONAS AERUGINOSA (A)  Final   Report Status 01/08/2021 FINAL  Final   Organism ID, Bacteria PSEUDOMONAS AERUGINOSA (A)  Final      Susceptibility   Pseudomonas aeruginosa - MIC*    CEFTAZIDIME >=64 RESISTANT Resistant     CIPROFLOXACIN <=0.25 SENSITIVE Sensitive     GENTAMICIN 2 SENSITIVE Sensitive     IMIPENEM 2 SENSITIVE Sensitive     * 20,000 COLONIES/mL PSEUDOMONAS AERUGINOSA  Blood Culture (routine x 2)     Status: Abnormal (Preliminary result)   Collection Time: 01/06/21  3:50 AM   Specimen: BLOOD  Result Value Ref Range Status   Specimen Description BLOOD SITE NOT SPECIFIED  Final   Special Requests   Final    BOTTLES DRAWN AEROBIC AND ANAEROBIC Blood Culture results may not be optimal due to an inadequate volume of blood received in culture bottles   Culture  Setup Time   Final    GRAM POSITIVE COCCI IN CHAINS IN PAIRS GRAM NEGATIVE RODS ANAEROBIC BOTTLE ONLY CRITICAL RESULT CALLED TO, READ BACK BY AND VERIFIED WITH: 03/08/21 PHARMD @1910  01/06/21 EB    Culture (A)  Final    PROTEUS MIRABILIS ENTEROCOCCUS FAECALIS CULTURE REINCUBATED FOR BETTER GROWTH Performed at [facility], [address] [place], [address] [place]    Report Status PENDING  Incomplete  Blood Culture ID Panel (Reflexed)     Status: Abnormal   Collection Time: 01/06/21  3:50 AM  Result Value Ref Range Status   Enterococcus faecalis DETECTED (A) NOT DETECTED Final    Comment: CRITICAL RESULT CALLED TO, READ BACK BY AND VERIFIED WITH: KIM HURTH PHARMD  01/06/21 EB    Enterococcus Faecium DETECTED (A) NOT DETECTED Final    Comment: CRITICAL RESULT CALLED TO, READ BACK BY  AND VERIFIED WITH: KIM HURTH PHARMD  01/06/21 EB    Listeria monocytogenes NOT DETECTED NOT DETECTED Final   Staphylococcus species NOT DETECTED NOT DETECTED Final   Staphylococcus aureus (BCID) NOT DETECTED NOT DETECTED Final   Staphylococcus epidermidis NOT DETECTED NOT DETECTED Final   Staphylococcus lugdunensis NOT DETECTED NOT DETECTED Final   Streptococcus species DETECTED (A) NOT DETECTED Final    Comment: CRITICAL RESULT CALLED TO, READ BACK BY AND VERIFIED WITH: KIM HURTH PHARMD  01/06/21 EB    Streptococcus agalactiae DETECTED (A) NOT DETECTED Final    Comment: CRITICAL RESULT CALLED TO, READ BACK BY AND VERIFIED WITH: Malva Cogan PHARMD  01/06/21 EB    Streptococcus pneumoniae NOT DETECTED NOT DETECTED Final   Streptococcus pyogenes NOT DETECTED NOT DETECTED Final   A.calcoaceticus-baumannii NOT DETECTED NOT DETECTED Final   Bacteroides fragilis NOT DETECTED NOT DETECTED Final   Enterobacterales DETECTED (A) NOT DETECTED Final    Comment: Enterobacterales represent a large order of gram negative bacteria, not a single organism. CRITICAL RESULT CALLED TO, READ BACK BY AND VERIFIED WITH: Malva Cogan PHARMD  01/06/21 EB    Enterobacter cloacae complex NOT DETECTED NOT DETECTED Final   Escherichia coli NOT DETECTED NOT DETECTED Final   Klebsiella aerogenes NOT DETECTED NOT DETECTED Final   Klebsiella oxytoca NOT DETECTED NOT DETECTED Final   Klebsiella pneumoniae NOT DETECTED NOT DETECTED Final   Proteus species DETECTED (A) NOT DETECTED Final    Comment: CRITICAL RESULT CALLED TO, READ BACK BY AND VERIFIED WITH: Malva Cogan PHARMD  01/06/21 EB    Salmonella species NOT DETECTED NOT DETECTED Final   Serratia marcescens NOT DETECTED NOT DETECTED Final   Haemophilus influenzae NOT DETECTED NOT DETECTED Final   Neisseria meningitidis NOT DETECTED NOT DETECTED Final   Pseudomonas aeruginosa NOT DETECTED NOT DETECTED Final   Stenotrophomonas maltophilia NOT DETECTED  NOT DETECTED Final   Candida albicans NOT DETECTED NOT DETECTED Final   Candida auris NOT DETECTED NOT DETECTED Final   Candida glabrata NOT DETECTED NOT DETECTED Final   Candida krusei NOT DETECTED NOT DETECTED Final   Candida parapsilosis NOT DETECTED NOT DETECTED Final   Candida tropicalis NOT DETECTED NOT DETECTED Final   Cryptococcus neoformans/gattii NOT DETECTED NOT DETECTED Final   CTX-M ESBL NOT DETECTED NOT DETECTED Final   Carbapenem resistance IMP NOT DETECTED NOT DETECTED Final   Carbapenem resistance KPC NOT DETECTED NOT DETECTED Final   Carbapenem resistance NDM NOT DETECTED NOT DETECTED Final   Carbapenem resist OXA 48 LIKE NOT DETECTED NOT DETECTED Final   Vancomycin resistance DETECTED (A) NOT DETECTED Final    Comment: CRITICAL RESULT CALLED TO, READ BACK BY AND VERIFIED WITH: KIM HURTH PHARMD  01/06/21 EB    Carbapenem resistance VIM NOT DETECTED NOT DETECTED Final    Comment: Performed at [facility], [address], [place], [place] [address]  Blood Culture (routine x 2)     Status: None (Preliminary result)   Collection Time: 01/06/21  3:52 AM   Specimen: BLOOD  Result Value Ref Range Status   Specimen Description BLOOD SITE NOT SPECIFIED  Final   Special Requests  Final    BOTTLES DRAWN AEROBIC AND ANAEROBIC Blood Culture adequate volume   Culture   Final    NO GROWTH 2 DAYS Performed at [facility], [address], [place], [place] [address]    Report Status PENDING  Incomplete  Resp Panel by RT-PCR (Flu A&B, Covid) Nasopharyngeal Swab     Status: None   Collection Time: 01/06/21  4:37 AM   Specimen: Nasopharyngeal Swab; Nasopharyngeal(NP) swabs in vial transport medium  Result Value Ref Range Status   SARS Coronavirus 2 by RT PCR NEGATIVE NEGATIVE Final    Comment: (NOTE) SARS-CoV-2 target nucleic acids are NOT DETECTED.  The SARS-CoV-2 RNA is generally detectable in upper respiratory specimens during the acute phase of infection.  The lowest concentration of SARS-CoV-2 viral copies this assay can detect is 138 copies/mL. A negative result does not preclude SARS-Cov-2 infection and should not be used as the sole basis for treatment or other patient management decisions. A negative result may occur with  improper specimen collection/handling, submission of specimen other than nasopharyngeal swab, presence of viral mutation(s) within the areas targeted by this assay, and inadequate number of viral copies(<138 copies/mL). A negative result must be combined with clinical observations, patient history, and epidemiological information. The expected result is Negative.  Fact Sheet for Patients:  BloggerCourse.com  Fact Sheet for Healthcare Providers:  SeriousBroker.it  This test is no t yet approved or cleared by the [place] FDA and  has been authorized for detection and/or diagnosis of SARS-CoV-2 by FDA under an Emergency Use Authorization (EUA). This EUA will remain  in effect (meaning this test can be used) for the duration of the COVID-19 declaration under Section 564(b)(1) of the Act, 21 U.S.C.section 360bbb-3(b)(1), unless the authorization is terminated  or revoked sooner.       Influenza A by PCR NEGATIVE NEGATIVE Final   Influenza B by PCR NEGATIVE NEGATIVE Final    Comment: (NOTE) The Xpert Xpress SARS-CoV-2/FLU/RSV plus assay is intended as an aid in the diagnosis of influenza from Nasopharyngeal swab specimens and should not be used as a sole basis for treatment. Nasal washings and aspirates are unacceptable for Xpert Xpress SARS-CoV-2/FLU/RSV testing.  Fact Sheet for Patients: BloggerCourse.com  Fact Sheet for Healthcare Providers: SeriousBroker.it  This test is not yet approved or cleared by the [place] FDA and has been authorized for detection and/or diagnosis of SARS-CoV-2 by FDA  under an Emergency Use Authorization (EUA). This EUA will remain in effect (meaning this test can be used) for the duration of the COVID-19 declaration under Section 564(b)(1) of the Act, 21 U.S.C. section 360bbb-3(b)(1), unless the authorization is terminated or revoked.  Performed at [facility], [address] [place], [place] [address]   Culture, blood (routine x 2)     Status: None (Preliminary result)   Collection Time: 01/07/21 12:14 PM   Specimen: BLOOD  Result Value Ref Range Status   Specimen Description BLOOD SITE NOT SPECIFIED  Final   Special Requests   Final    BOTTLES DRAWN AEROBIC ONLY Blood Culture results may not be optimal due to an inadequate volume of blood received in culture bottles   Culture   Final    NO GROWTH < 24 HOURS Performed at [facility], [address], [place], [place] [address]    Report Status PENDING  Incomplete  Culture, blood (routine x 2)     Status: None (Preliminary result)   Collection Time:  01/07/21 12:14 PM   Specimen: BLOOD  Result Value Ref Range Status   Specimen Description BLOOD SITE NOT SPECIFIED  Final   Special Requests   Final    BOTTLES DRAWN AEROBIC ONLY Blood Culture results may not be optimal due to an inadequate volume of blood received in culture bottles   Culture   Final    NO GROWTH < 24 HOURS Performed at [facility], [address] [place], [place] [address]    Report Status PENDING  Incomplete      Radiology Studies: CT ANGIO CHEST PE W OR WO CONTRAST  Result Date: 01/07/2021 CLINICAL DATA:  Hypoxemia and bacteremia EXAM: CT ANGIOGRAPHY CHEST CT ABDOMEN AND PELVIS WITH CONTRAST TECHNIQUE: Multidetector CT imaging of the chest was performed using the standard protocol during bolus administration of intravenous contrast. Multiplanar CT image reconstructions and MIPs were obtained to evaluate the vascular anatomy. Multidetector CT imaging of the abdomen and pelvis was performed using the  standard protocol during bolus administration of intravenous contrast. CONTRAST:  99mL OMNIPAQUE IOHEXOL 350 MG/ML SOLN COMPARISON:  Ultrasound 01/07/2021 radiograph 01/06/2021 CT 1222 FINDINGS: CTA CHEST FINDINGS Cardiovascular: Satisfactory opacification of pulmonary arteries. No visible pulmonary arterial filling defects though respiratory motion may limit detection of smaller segmental and subsegmental filling defects. Central pulmonary arteries are enlarged though similar to prior caliber. Cardiac size is within normal limits. No pericardial effusion. Three-vessel coronary artery atherosclerosis. The aortic root is suboptimally assessed given cardiac pulsation artifact. Atherosclerotic plaque throughout the thoracic aorta. Dilatation of the distal thoracic aortic arch to 3.6 cm, returning to a more normal caliber by the level of the diaphragmatic hiatus of 2.9 cm. Mild aortic tortuosity, can be senescent. No acute luminal abnormality of the imaged aorta within the limitations of the suboptimally opacified exam. No periaortic stranding or hemorrhage. Normal 3 vessel branching of the aortic arch with calcifications in the proximal great vessels. No major venous abnormalities. Mediastinum/Nodes: No mediastinal fluid or gas. Normal thyroid gland and thoracic inlet. No acute abnormality of the trachea or esophagus. No worrisome mediastinal, hilar or axillary adenopathy. Lungs/Pleura: Low volumes and atelectasis on water likely mild emphysematous changes. Some additional vascular redistribution, septal thickening is noted as well, can reflect mild interstitial edema. Trace pleural effusions, right greater than left, with dependent and passive atelectatic changes in the lung bases. No concerning pulmonary nodules or masses. Additional bandlike areas of opacity likely reflect scarring and or atelectasis. Musculoskeletal: Multilevel degenerative changes are present in the imaged portions of the spine. Likely remote  anterior wedging deformities T10, T12, unchanged from comparison. Superimposed Schmorl's node formations and vacuum disc phenomenon. No acute fracture or or conspicuous osseous abnormality is seen. Mild body wall edema. No worrisome chest wall mass or lesion. Benign macro calcifications seen in the breast tissues, suspect many of which are mass killer. Review of the MIP images confirms the above findings. CT ABDOMEN and PELVIS FINDINGS Hepatobiliary: No worrisome focal liver lesions. Smooth liver surface contour. Normal hepatic attenuation. Gallbladder decompressed. No pericholecystic fluid or inflammation. No visible calcified gallstone or biliary ductal dilatation. Pancreas: No pancreatic ductal dilatation or surrounding inflammatory changes. Spleen: Normal in size. No concerning splenic lesions. Adrenals/Urinary Tract: Normal adrenals. Kidneys enhance and excrete symmetrically. Bilateral extrarenal pelves. Question some mild bladder wall hyperemia and urothelial thickening which could reflect ascending infection in the appropriate clinical context. Additionally, there is a punctate radiodensity layering in the left posterolateral bladder, separate from the ureterovesicular junction, suggestive of a layering  bladder calculus. Stomach/Bowel: Extensive vascular collateralization in varices formation about the distal esophagus and proximal stomach. Percutaneous gastrostomy tube in place with adequate pexy of the anterior wall of the gastric body to the anterior abdominal wall. No acute complication. Normally seated balloon. Duodenum is unremarkable accounting for distension. No small bowel thickening or dilatation is seen. High attenuation enteric contrast media traverses through the colon to the level of the rectal vault, lack of formed stool, can be seen with rapid transit state. Scattered colonic diverticula without focal inflammation to suggest diverticulitis. 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 Atherosclerotic calcifications within the abdominal aorta and branch vessels. No other aneurysm or ectasia. No enlarged abdominopelvic lymph nodes. Reproductive: Anteverted uterus. No concerning adnexal lesions. There is slight asymmetric prominence of the left gonadal vein and parametrial vasculature, nonspecific though can be seen in the setting of pelvic congestion. Other: Stranding and inflammatory changes appears centered upon the right psoas musculature with some additional hazy stranding distributed in the retroperitoneum. Possibly reactive change. No free air. No bowel containing hernia. Musculoskeletal: Interval contraction of the previously expanded right psoas musculature albeit with a persistent hypoattenuating focus centrally within the muscle belly (6/45, 3/53), can reflect a residual intramuscular hematoma or abscess in the setting of sepsis. No other intramuscular collection is seen. Multilevel degenerative changes are noted in the lumbar levels. Levocurvature apex L2-3. Associated pelvic tilt. Degenerative changes in the hips and pelvis as well. No acute or conspicuous osseous lesions. Review of the MIP images confirms the above findings. IMPRESSION: 1. No evidence of acute pulmonary embolism though respiratory motion may limit detection of smaller segmental and subsegmental filling defects. 2. Enlarged central pulmonary arteries, suggestive of pulmonary arterial hypertension. 3. Trace pleural effusions, right greater than left, with dependent and passive atelectatic changes in the lung bases. 4. Question some mild bladder wall hyperemia and urothelial thickening which could reflect ascending infection in the appropriate clinical context. Correlate with urinalysis. 5. Interval contraction of the previously expanded right psoas musculature albeit with a persistent hypoattenuating focus centrally within the muscle belly, can reflect a residual  intramuscular hematoma or abscess in the setting of sepsis. Stranding and inflammatory changes appears centered upon the right psoas musculature with some additional hazy stranding distributed in the retroperitoneum, which could reflect reactive change. 6. High attenuation enteric contrast media traverses through the colon to the level of the rectal vault, lack of formed stool, can be seen with rapid transit state. 7. Dilatation of the aortic arch to 3.6 cm. Recommend annual imaging followup by CTA or MRA. This recommendation follows 2010 ACCF/AHA/AATS/ACR/ASA/SCA/SCAI/SIR/STS/SVM Guidelines for the Diagnosis and Management of Patients with Thoracic Aortic Disease. Circulation.2010; 121: Z610-R604. Aortic aneurysm NOS (ICD10-I71.9) 8. 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 Recommend follow-up ultrasound every 3 years. This recommendation follows ACR consensus guidelines: White Paper of the ACR Incidental Findings Committee II on Vascular Findings. J Am Coll Radiol 2013; 10:789-794. 9. Aortic Atherosclerosis (ICD10-I70.0). 10. Coronary artery calcifications are present. Please note that the presence of coronary artery calcium documents the presence of coronary artery disease, the severity of this disease and any potential stenosis cannot be assessed on this non-gated CT examination. Electronically Signed   By: Kreg Shropshire M.D.   On: 01/07/2021 19:57   CT ABDOMEN PELVIS W CONTRAST  Result Date: 01/07/2021 CLINICAL DATA:  Hypoxemia and bacteremia EXAM: CT ANGIOGRAPHY CHEST CT ABDOMEN AND PELVIS WITH CONTRAST TECHNIQUE: Multidetector CT imaging of  the chest was performed using the standard protocol during bolus administration of intravenous contrast. Multiplanar CT image reconstructions and MIPs were obtained to evaluate the vascular anatomy. Multidetector CT imaging of the abdomen and pelvis was performed using the standard protocol during bolus administration of intravenous  contrast. CONTRAST:  99mL OMNIPAQUE IOHEXOL 350 MG/ML SOLN COMPARISON:  Ultrasound 01/07/2021 radiograph 01/06/2021 CT 1222 FINDINGS: CTA CHEST FINDINGS Cardiovascular: Satisfactory opacification of pulmonary arteries. No visible pulmonary arterial filling defects though respiratory motion may limit detection of smaller segmental and subsegmental filling defects. Central pulmonary arteries are enlarged though similar to prior caliber. Cardiac size is within normal limits. No pericardial effusion. Three-vessel coronary artery atherosclerosis. The aortic root is suboptimally assessed given cardiac pulsation artifact. Atherosclerotic plaque throughout the thoracic aorta. Dilatation of the distal thoracic aortic arch to 3.6 cm, returning to a more normal caliber by the level of the diaphragmatic hiatus of 2.9 cm. Mild aortic tortuosity, can be senescent. No acute luminal abnormality of the imaged aorta within the limitations of the suboptimally opacified exam. No periaortic stranding or hemorrhage. Normal 3 vessel branching of the aortic arch with calcifications in the proximal great vessels. No major venous abnormalities. Mediastinum/Nodes: No mediastinal fluid or gas. Normal thyroid gland and thoracic inlet. No acute abnormality of the trachea or esophagus. No worrisome mediastinal, hilar or axillary adenopathy. Lungs/Pleura: Low volumes and atelectasis on water likely mild emphysematous changes. Some additional vascular redistribution, septal thickening is noted as well, can reflect mild interstitial edema. Trace pleural effusions, right greater than left, with dependent and passive atelectatic changes in the lung bases. No concerning pulmonary nodules or masses. Additional bandlike areas of opacity likely reflect scarring and or atelectasis. Musculoskeletal: Multilevel degenerative changes are present in the imaged portions of the spine. Likely remote anterior wedging deformities T10, T12, unchanged from comparison.  Superimposed Schmorl's node formations and vacuum disc phenomenon. No acute fracture or or conspicuous osseous abnormality is seen. Mild body wall edema. No worrisome chest wall mass or lesion. Benign macro calcifications seen in the breast tissues, suspect many of which are mass killer. Review of the MIP images confirms the above findings. CT ABDOMEN and PELVIS FINDINGS Hepatobiliary: No worrisome focal liver lesions. Smooth liver surface contour. Normal hepatic attenuation. Gallbladder decompressed. No pericholecystic fluid or inflammation. No visible calcified gallstone or biliary ductal dilatation. Pancreas: No pancreatic ductal dilatation or surrounding inflammatory changes. Spleen: Normal in size. No concerning splenic lesions. Adrenals/Urinary Tract: Normal adrenals. Kidneys enhance and excrete symmetrically. Bilateral extrarenal pelves. Question some mild bladder wall hyperemia and urothelial thickening which could reflect ascending infection in the appropriate clinical context. Additionally, there is a punctate radiodensity layering in the left posterolateral bladder, separate from the ureterovesicular junction, suggestive of a layering bladder calculus. Stomach/Bowel: Extensive vascular collateralization in varices formation about the distal esophagus and proximal stomach. Percutaneous gastrostomy tube in place with adequate pexy of the anterior wall of the gastric body to the anterior abdominal wall. No acute complication. Normally seated balloon. Duodenum is unremarkable accounting for distension. No small bowel thickening or dilatation is seen. High attenuation enteric contrast media traverses through the colon to the level of the rectal vault, lack of formed stool, can be seen with rapid transit state. Scattered colonic diverticula without focal inflammation to suggest diverticulitis. 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 Atherosclerotic  calcifications within the abdominal aorta and branch vessels. No other aneurysm or ectasia. No enlarged abdominopelvic lymph nodes. Reproductive: Anteverted uterus. No  concerning adnexal lesions. There is slight asymmetric prominence of the left gonadal vein and parametrial vasculature, nonspecific though can be seen in the setting of pelvic congestion. Other: Stranding and inflammatory changes appears centered upon the right psoas musculature with some additional hazy stranding distributed in the retroperitoneum. Possibly reactive change. No free air. No bowel containing hernia. Musculoskeletal: Interval contraction of the previously expanded right psoas musculature albeit with a persistent hypoattenuating focus centrally within the muscle belly (6/45, 3/53), can reflect a residual intramuscular hematoma or abscess in the setting of sepsis. No other intramuscular collection is seen. Multilevel degenerative changes are noted in the lumbar levels. Levocurvature apex L2-3. Associated pelvic tilt. Degenerative changes in the hips and pelvis as well. No acute or conspicuous osseous lesions. Review of the MIP images confirms the above findings. IMPRESSION: 1. No evidence of acute pulmonary embolism though respiratory motion may limit detection of smaller segmental and subsegmental filling defects. 2. Enlarged central pulmonary arteries, suggestive of pulmonary arterial hypertension. 3. Trace pleural effusions, right greater than left, with dependent and passive atelectatic changes in the lung bases. 4. Question some mild bladder wall hyperemia and urothelial thickening which could reflect ascending infection in the appropriate clinical context. Correlate with urinalysis. 5. Interval contraction of the previously expanded right psoas musculature albeit with a persistent hypoattenuating focus centrally within the muscle belly, can reflect a residual intramuscular hematoma or abscess in the setting of sepsis. Stranding and  inflammatory changes appears centered upon the right psoas musculature with some additional hazy stranding distributed in the retroperitoneum, which could reflect reactive change. 6. High attenuation enteric contrast media traverses through the colon to the level of the rectal vault, lack of formed stool, can be seen with rapid transit state. 7. Dilatation of the aortic arch to 3.6 cm. Recommend annual imaging followup by CTA or MRA. This recommendation follows 2010 ACCF/AHA/AATS/ACR/ASA/SCA/SCAI/SIR/STS/SVM Guidelines for the Diagnosis and Management of Patients with Thoracic Aortic Disease. Circulation.2010; 121: E952-W413. Aortic aneurysm NOS (ICD10-I71.9) 8. 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 Recommend follow-up ultrasound every 3 years. This recommendation follows ACR consensus guidelines: White Paper of the ACR Incidental Findings Committee II on Vascular Findings. J Am Coll Radiol 2013; 10:789-794. 9. Aortic Atherosclerosis (ICD10-I70.0). 10. Coronary artery calcifications are present. Please note that the presence of coronary artery calcium documents the presence of coronary artery disease, the severity of this disease and any potential stenosis cannot be assessed on this non-gated CT examination. Electronically Signed   By: Kreg Shropshire M.D.   On: 01/07/2021 19:57   [place] ASCITES (ABDOMEN LIMITED)  Result Date: 01/07/2021 CLINICAL DATA:  Evaluate for ascites. EXAM: LIMITED ABDOMEN ULTRASOUND FOR ASCITES TECHNIQUE: Limited ultrasound survey for ascites was performed in all four abdominal quadrants. COMPARISON:  None. FINDINGS: No ascites identified today. IMPRESSION: No ascites identified. Electronically Signed   By: Gerome Sam III M.D   On: 01/07/2021 11:01     Scheduled Meds: . feeding supplement (PROSource TF)  45 mL Per Tube Daily  . free water  200 mL Per Tube Q6H  . lactulose  20 g Per Tube BID  . levothyroxine  125 mcg Per Tube QAC breakfast  .  multivitamin with minerals  1 tablet Per Tube Daily  . nystatin ointment  1 application Topical BID  . pantoprazole sodium  40 mg Per Tube BID  . rifaximin  550 mg Per Tube BID  . thiamine  100 mg Per Tube Daily   Continuous Infusions: .  ceFEPime (MAXIPIME) IV 2 g (01/08/21 0916)  . DAPTOmycin (CUBICIN)  IV 560 mg (01/07/21 2110)  . dextrose 5 % and 0.45% NaCl 50 mL/hr at 01/07/21 0844  . feeding supplement (OSMOLITE 1.2 CAL) 1,000 mL (01/07/21 1213)     LOS: 2 days   Time Spent in minutes   45 minutes  Chariah Bailey D.O. on 01/08/2021 at 1:00 PM  Between 7am to 7pm - Please see pager noted on [email or web address]  After 7pm go to [email or web address]  And look for the night coverage person covering for me after hours  [facility] Office  [phone]

## 2021-01-08 NOTE — Progress Notes (Signed)
Nurse spoke with pt's daughter regarding plan of care.

## 2021-01-09 DIAGNOSIS — J9601 Acute respiratory failure with hypoxia: Secondary | ICD-10-CM | POA: Diagnosis not present

## 2021-01-09 DIAGNOSIS — R4182 Altered mental status, unspecified: Secondary | ICD-10-CM | POA: Diagnosis not present

## 2021-01-09 DIAGNOSIS — K7030 Alcoholic cirrhosis of liver without ascites: Secondary | ICD-10-CM | POA: Diagnosis not present

## 2021-01-09 DIAGNOSIS — A499 Bacterial infection, unspecified: Secondary | ICD-10-CM | POA: Diagnosis not present

## 2021-01-09 DIAGNOSIS — K7460 Unspecified cirrhosis of liver: Secondary | ICD-10-CM

## 2021-01-09 DIAGNOSIS — Z931 Gastrostomy status: Secondary | ICD-10-CM

## 2021-01-09 LAB — BLOOD CULTURE ID PANEL (REFLEXED) - BCID2

## 2021-01-09 LAB — CBC
HCT: 26.9 % — ABNORMAL LOW (ref 36.0–46.0)
Hemoglobin: 8 g/dL — ABNORMAL LOW (ref 12.0–15.0)
MCH: 28.2 pg (ref 26.0–34.0)
MCHC: 29.7 g/dL — ABNORMAL LOW (ref 30.0–36.0)
MCV: 94.7 fL (ref 80.0–100.0)
Platelets: 395 10*3/uL (ref 150–400)
RBC: 2.84 MIL/uL — ABNORMAL LOW (ref 3.87–5.11)
RDW: 17.5 % — ABNORMAL HIGH (ref 11.5–15.5)
WBC: 6 10*3/uL (ref 4.0–10.5)
nRBC: 0.7 % — ABNORMAL HIGH (ref 0.0–0.2)

## 2021-01-09 LAB — GLUCOSE, CAPILLARY
Glucose-Capillary: 109 mg/dL — ABNORMAL HIGH (ref 70–99)
Glucose-Capillary: 116 mg/dL — ABNORMAL HIGH (ref 70–99)
Glucose-Capillary: 117 mg/dL — ABNORMAL HIGH (ref 70–99)
Glucose-Capillary: 119 mg/dL — ABNORMAL HIGH (ref 70–99)
Glucose-Capillary: 120 mg/dL — ABNORMAL HIGH (ref 70–99)
Glucose-Capillary: 129 mg/dL — ABNORMAL HIGH (ref 70–99)

## 2021-01-09 LAB — BASIC METABOLIC PANEL
Anion gap: 8 (ref 5–15)
BUN: 15 mg/dL (ref 8–23)
CO2: 22 mmol/L (ref 22–32)
Calcium: 8.1 mg/dL — ABNORMAL LOW (ref 8.9–10.3)
Chloride: 105 mmol/L (ref 98–111)
Creatinine, Ser: 0.72 mg/dL (ref 0.44–1.00)
GFR, Estimated: >60 mL/min (ref >60)
Glucose, Bld: 127 mg/dL — ABNORMAL HIGH (ref 70–99)
Potassium: 4.9 mmol/L (ref 3.5–5.1)
Sodium: 135 mmol/L (ref 135–145)

## 2021-01-09 MED ORDER — ENOXAPARIN SODIUM 40 MG/0.4ML ~~LOC~~ SOLN
40.0000 mg | SUBCUTANEOUS | Status: DC
  Administered 2021-01-11 – 2021-01-12 (×2): 40 mg via SUBCUTANEOUS
  Filled 2021-01-09 (×2): qty 0.4

## 2021-01-09 MED ORDER — ENOXAPARIN SODIUM 40 MG/0.4ML ~~LOC~~ SOLN
40.0000 mg | SUBCUTANEOUS | Status: DC
  Administered 2021-01-09: 40 mg via SUBCUTANEOUS
  Filled 2021-01-09: qty 0.4

## 2021-01-09 MED ORDER — PANTOPRAZOLE SODIUM 40 MG PO TBEC
40.0000 mg | DELAYED_RELEASE_TABLET | Freq: Two times a day (BID) | ORAL | Status: DC
  Administered 2021-01-09: 40 mg via ORAL
  Filled 2021-01-09: qty 1

## 2021-01-09 NOTE — Progress Notes (Signed)
PROGRESS NOTE    Kaylee King  QVZ:[id number] DOB: February 12, 1941 DOA: 01/06/2021 PCP: Fleet Contras, MD   Brief Narrative:  HPI On 01/06/2021 by Dr. Madelyn Flavors Kaylee King is a 80 y.o. 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  Patient unable to provide any significant history at this time and only tells me to stop trying to examine the patient.  Daughter notes that recently when she has visited her at [facility] patient is curled up on her side it seems due to pain.  Whenever her pain has been adequately treated she will sit up on the side of bed and is able to answer questions.  Recently the patient has had what appeared to be dried blood on her lips, but daughter had not been told of any episodes of vomiting.   Patient was found by EMS to be tachycardic and diaphoretic with O2 saturations around 85% on room air.  She was placed on nonrebreather at 10 L with improvement of O2 saturations to 93%.   Recently hospitalized from 1/20-2/15 after presenting with hemorrhagic shock due to a variceal bleed requiring 6 units PRBCs and 2 units of FFP.  EGD by GI on 1/20 showed esophageal varices which were banded and several nonbleeding gastric ulcers.  Patient was intubated for airway protection, and diagnosed with pneumonia treated with 7 days of Rocephin IV.  Patient was able to be weaned off the ventilator to 2 L of nasal cannula oxygen, but required PEG tube placement for nutrition.  Urine cultures from that hospitalization grew out E. coli and Morganella.  E. coli was sensitive to Rocephin, but sensitivities were not reported for Rocephin with Morganella.  She was discharged to [facility].  Interim history Admitted for severe sepsis suspected to be secondary to UTI, on broad spectrum antibiotics.  Patient also noted to have possible GI bleed,  gastroenterology consulted and appreciated.  Infectious disease also consulted for questionable bacteremia.  Palliative care consulted for goals of care discussion. Assessment & Plan   Severe sepsis secondary to UTI/ ? Intramuscular hematoma or abscess vs ?bacteremia -Patient presented with tachypnea, leukocytosis and elevated lactic acid level along with acute metabolic encephalopathy -Chest x-ray unremarkable for infection. -UA positive for moderate leukocytes, rare bacteria, 21-50 WBCs -Previous hospitalization, patient had >100K of E. coli and Morganella and had received Rocephin for suspected treatment of community-acquired pneumonia -Patient initially placed on vancomycin and Zosyn -Blood culture showed GPC in chains and pairs, GNR-reflex ID detected Enterococcus faecalis, Enterococcus faecium, Streptococcus species, Streptococcus agalactiae, Enterobacterales, Proteus species, VRE -Repeat Blood culture 3/62022 show 1/2 cultures with staph epi, methicillin resistance, question if this is contamination -transitioned  on Daptomycin and cefepime -DDimer 6.45 -Obtained CTA chest/abd/pelvis: No PE. Trace pleural effusions. 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 Mild bladder wall hyperemia. Right psoas hematoma or abscess.  -Infectious disease consulted and appreciated, agreed with continuing antibiotics for now  Acute metabolic encephalopathy -Patient was noted to be altered at her facility -May be secondary to the above with urinary tract infection versus cirrhosis -Continue to monitor and treat underlying conditions -Currently in soft restraints -On 3/7, discussed with patient's daughter via phone, patient has had some cognitive impairment but not much prior to her hospitalization in January 2022, however she has not returned to her normal baseline since that hospitalization. -Continues to be altered, and not at baseline  Acute hypoxic respiratory  failure -Initially oxygen saturations were 85% on room air and she required a nonrebreather -Continues to be on 3 L of nasal cannula, will attempt to wean if possible -As above chest x-ray unremarkable for infiltrate or infection however did show low lung volumes -Continue incentive spirometry if able  Chronic normocytic anemia/possible GI bleed -Patient with history of esophageal and gastric varices -Hemoglobin of 7.7 on admission, up to 8 today -FOBT was positive  -Gastroenterology consulted and appreciated -Patient's tube feeds have been resumed along with dysphagia 1 diet with assistance -Continue PPI  Hypotension -Patient noted to have some soft blood pressures, however blood pressure has now improved and is stabilized -Will DC IV fluids -Amlodipine held  Cirrhosis with portal hypertension and history of varices -As above, gastroenterology consulted and appreciated, currently no plans for repeat EGD at this time -She recently underwent EGD on 120 and had banding of 6 esophageal varices with noted nonbleeding gastric ulcers -Pending abdominal ultrasound -Continue rifaximin and lactulose  Hypothyroidism -TSH 15.113, appears improved from TSH in February 2022 -Continue Synthroid, dose increased 125 mcg daily -Repeat TSH in the outpatient setting in 4 to 6 weeks  Dysphagia -Patient has PEG placement -Speech therapy reevaluated patient and recommended dysphagia 1 diet  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 Daughter via phone on 3/7  Disposition Plan:  Status is: Inpatient  Remains inpatient appropriate because:Altered mental status, IV treatments appropriate due to intensity of illness or inability to take PO and Inpatient level of care appropriate due to severity of illness   Dispo: The patient is from: SNF              Anticipated d/c is to: SNF              Patient currently is not medically stable to d/c.   Difficult to place patient No   Consultants Gastroenterology Infectious disease  Procedures  None  Antibiotics   Anti-infectives (From admission, onward)   Start     Dose/Rate Route Frequency Ordered Stop   01/08/21 0500  vancomycin (VANCOREADY) IVPB 1250 mg/250 mL  Status:  Discontinued        1,250 mg 166.7 mL/hr over 90 Minutes Intravenous Every 48 hours 01/06/21 1051 01/06/21 2053   01/07/21 2200  ceFEPIme (MAXIPIME) 2 g in sodium chloride 0.9 % 100 mL IVPB  Status:  Discontinued        2 g 200 mL/hr over 30 Minutes Intravenous Every 12 hours 01/07/21 1105 01/09/21 0850   01/06/21 2300  cefTRIAXone (ROCEPHIN) 2 g in sodium chloride 0.9 % 100 mL IVPB  Status:  Discontinued        2 g 200 mL/hr over 30 Minutes Intravenous Every 24 hours 01/06/21 2053 01/07/21 1059   01/06/21 2145  DAPTOmycin (CUBICIN) 560 mg in sodium chloride 0.9 % IVPB  Status:  Discontinued        560 mg 222.4 mL/hr over 30 Minutes Intravenous Daily 01/06/21 2053 01/09/21 0850  01/06/21 1400  piperacillin-tazobactam (ZOSYN) IVPB 3.375 g  Status:  Discontinued       "Followed by" Linked Group Details   3.375 g 12.5 mL/hr over 240 Minutes Intravenous Every 8 hours 01/06/21 0836 01/06/21 2053   01/06/21 1030  rifaximin (XIFAXAN) tablet 550 mg        550 mg Per Tube 2 times daily 01/06/21 0910     01/06/21 0845  piperacillin-tazobactam (ZOSYN) IVPB 3.375 g       "Followed by" Linked Group Details   3.375 g 100 mL/hr  over 30 Minutes Intravenous  Once 01/06/21 0836 01/06/21 1010   01/06/21 0830  piperacillin-tazobactam (ZOSYN) IVPB 3.375 g  Status:  Discontinued        3.375 g 100 mL/hr over 30 Minutes Intravenous Every 8 hours 01/06/21 0826 01/06/21 0836   01/06/21 0345  ceFEPIme (MAXIPIME) 2 g in sodium chloride 0.9 % 100 mL IVPB        2 g 200 mL/hr over 30 Minutes Intravenous  Once 01/06/21 0332 01/06/21 0435   01/06/21 0345  metroNIDAZOLE (FLAGYL) IVPB 500 mg        500 mg 100 mL/hr over 60 Minutes Intravenous  Once 01/06/21 0332 01/06/21 0753   01/06/21 0345  vancomycin (VANCOREADY) IVPB 1000 mg/200 mL  Status:  Discontinued        1,000 mg 200 mL/hr over 60 Minutes Intravenous  Once 01/06/21 0332 01/06/21 0341   01/06/21 0345  vancomycin (VANCOREADY) IVPB 1500 mg/300 mL        1,500 mg 150 mL/hr over 120 Minutes Intravenous  Once 01/06/21 0341 01/06/21 0643      Subjective:   Symia Herdt seen and examined today.  Continues to be altered.  Able to tell me her name and say bye.  Not able to answer other questions at this time.  Objective:   Vitals:   01/09/21 0400 01/09/21 0500 01/09/21 0740 01/09/21 1056  BP: (!) 123/58     Pulse:      Resp: 18 19    Temp:   98.5 F (36.9 C) 99.3 F (37.4 C)  TempSrc:   Oral Oral  SpO2: 98% 98%    Weight:      Height:        Intake/Output Summary (Last 24 hours) at 01/09/2021 1110 Last data filed at 01/09/2021 0944 Gross per 24 hour  Intake 798.35 ml  Output 2450 ml  Net -1651.65 ml   Filed Weights   01/08/21 0127 01/09/21 0007 01/09/21 0338  Weight: 71.7 kg 70.1 kg 69.9 kg   Exam  General: Well developed, acutely/chronically ill-appearing, NAD  HEENT: NCAT, mucous membranes moist.   Cardiovascular: S1 S2 auscultated, RRR  Respiratory: Diminished breath sounds anteriorly  Abdomen: Soft, nontender, nondistended, + bowel sounds, PEG tube in place  Extremities: warm dry without cyanosis clubbing or edema  Neuro: AAOx 1, (self,), not  answering any of my questions or following commands at this time.  Psych: cannot assess at this time  Data Reviewed: I have personally reviewed following labs and imaging studies  CBC: Recent Labs  Lab 01/06/21 0332 01/06/21 1211 01/06/21 1638 01/06/21 2229 01/07/21 0300 01/08/21 0432 01/09/21 0340  WBC 16.7*  --   --   --  8.2 6.9 6.0  NEUTROABS 15.7*  --   --   --   --   --   --   HGB 7.7*   < > 7.1* 7.7* 8.4* 8.1* 8.0*  HCT 26.5*   < >  23.2* 24.4* 27.8* 27.4* 26.9*  MCV 97.1  --   --   --  93.9 95.5 94.7  PLT 498*  --   --   --  409* 422* 395   < > = values in this interval not displayed.   Basic Metabolic Panel: Recent Labs  Lab 01/06/21 0332 01/07/21 0300 01/08/21 0432 01/09/21 0340  NA 133* 137 134* 135  K 5.2* 5.1 4.2 4.9  CL 101 106 106 105  CO2 22 21* 18* 22  GLUCOSE 191* 80 126* 127*  BUN 26* CREATININE 0.97 0.95 0.87 0.72  CALCIUM 8.4* 8.5* 8.2* 8.1*   GFR: Estimated Creatinine Clearance: 52 mL/min (by C-G formula based on SCr of 0.72 mg/dL). Liver Function Tests: Recent Labs  Lab 01/06/21 0332 01/07/21 0300 01/08/21 0432  AST 63* 52* 50*  ALT 57* 42 44  ALKPHOS 289* 211* 206*  BILITOT 1.1 1.0 0.7  PROT 7.2 6.5 6.7  ALBUMIN 2.3* 2.1* 2.1*   No results for input(s): LIPASE, AMYLASE in the last 168 hours. Recent Labs  Lab 01/06/21 0436 01/08/21 0432  AMMONIA 29 31   Coagulation Profile: Recent Labs  Lab 01/06/21 0332 01/06/21 0503  INR 1.1 1.1   Cardiac Enzymes: Recent Labs  Lab 01/07/21 0300  CKTOTAL 34*   BNP (last 3 results) No results for input(s): PROBNP in the last 8760 hours. HbA1C: No results for input(s): HGBA1C in the last 72 hours. CBG: Recent Labs  Lab 01/08/21 1612 01/08/21 2047 01/09/21 0013 01/09/21 0742 01/09/21 1057  GLUCAP 124* 114* 129* 119* 116*   Lipid Profile: No results for input(s): CHOL, HDL, LDLCALC, TRIG, CHOLHDL, LDLDIRECT in the last 72 hours. Thyroid Function Tests: No results  for input(s): TSH, T4TOTAL, FREET4, T3FREE, THYROIDAB in the last 72 hours. Anemia Panel: No results for input(s): VITAMINB12, FOLATE, FERRITIN, TIBC, IRON, RETICCTPCT in the last 72 hours. Urine analysis:    Component Value Date/Time   COLORURINE YELLOW 01/06/2021 0621   APPEARANCEUR HAZY (A) 01/06/2021 0621   LABSPEC 1.013 01/06/2021 0621   PHURINE 6.0 01/06/2021 0621   GLUCOSEU NEGATIVE 01/06/2021 0621   HGBUR NEGATIVE 01/06/2021 0621   BILIRUBINUR NEGATIVE 01/06/2021 0621   KETONESUR NEGATIVE 01/06/2021 0621   PROTEINUR 30 (A) 01/06/2021 0621   NITRITE NEGATIVE 01/06/2021 0621   LEUKOCYTESUR MODERATE (A) 01/06/2021 0621   Sepsis Labs: (procalcitonin:4,lacticidven:4)  ) Recent Results (from the past 240 hour(s))  Urine culture     Status: Abnormal   Collection Time: 01/06/21  3:32 AM   Specimen: In/Out Cath Urine  Result Value Ref Range Status   Specimen Description IN/OUT CATH URINE  Final   Special Requests   Final    NONE Performed at [facility], [address], [place], [place] [address]    Culture 20,000 COLONIES/mL PSEUDOMONAS AERUGINOSA (A)  Final   Report Status 01/08/2021 FINAL  Final   Organism ID, Bacteria PSEUDOMONAS AERUGINOSA (A)  Final      Susceptibility   Pseudomonas aeruginosa - MIC*    CEFTAZIDIME >=64 RESISTANT Resistant     CIPROFLOXACIN <=0.25 SENSITIVE Sensitive     GENTAMICIN 2 SENSITIVE Sensitive     IMIPENEM 2 SENSITIVE Sensitive     * 20,000 COLONIES/mL PSEUDOMONAS AERUGINOSA  Blood Culture (routine x 2)     Status: Abnormal (Preliminary result)   Collection Time: 01/06/21  3:50 AM   Specimen: BLOOD  Result Value Ref Range Status   Specimen Description BLOOD SITE  NOT SPECIFIED  Final   Special Requests   Final    BOTTLES DRAWN AEROBIC AND ANAEROBIC Blood Culture results may not be optimal due to an inadequate volume of blood received in culture bottles   Culture  Setup Time   Final    GRAM POSITIVE COCCI IN CHAINS IN  PAIRS GRAM NEGATIVE RODS ANAEROBIC BOTTLE ONLY CRITICAL RESULT CALLED TO, READ BACK BY AND VERIFIED WITH: Malva Cogan PHARMD  01/06/21 EB    Culture (A)  Final    PROTEUS MIRABILIS ENTEROCOCCUS FAECALIS CULTURE REINCUBATED FOR BETTER GROWTH    Report Status PENDING  Incomplete   Organism ID, Bacteria PROTEUS MIRABILIS  Final      Susceptibility   Proteus mirabilis - MIC*    AMPICILLIN <=2 SENSITIVE Sensitive     CEFAZOLIN <=4 SENSITIVE Sensitive     CEFEPIME <=0.12 SENSITIVE Sensitive     CEFTAZIDIME <=1 SENSITIVE Sensitive     CEFTRIAXONE <=0.25 SENSITIVE Sensitive     CIPROFLOXACIN <=0.25 SENSITIVE Sensitive     GENTAMICIN <=1 SENSITIVE Sensitive     IMIPENEM 2 SENSITIVE Sensitive     TRIMETH/SULFA <=20 SENSITIVE Sensitive     AMPICILLIN/SULBACTAM <=2 SENSITIVE Sensitive     PIP/TAZO Value in next row Sensitive      <=4 SENSITIVEPerformed at [facility], [address] [place], [place] [address]    * PROTEUS MIRABILIS  Blood Culture ID Panel (Reflexed)     Status: Abnormal   Collection Time: 01/06/21  3:50 AM  Result Value Ref Range Status   Enterococcus faecalis DETECTED (A) NOT DETECTED Final    Comment: CRITICAL RESULT CALLED TO, READ BACK BY AND VERIFIED WITH: KIM HURTH PHARMD  01/06/21 EB    Enterococcus Faecium DETECTED (A) NOT DETECTED Final    Comment: CRITICAL RESULT CALLED TO, READ BACK BY AND VERIFIED WITH: KIM HURTH PHARMD  01/06/21 EB    Listeria monocytogenes NOT DETECTED NOT DETECTED Final   Staphylococcus species NOT DETECTED NOT DETECTED Final   Staphylococcus aureus (BCID) NOT DETECTED NOT DETECTED Final   Staphylococcus epidermidis NOT DETECTED NOT DETECTED Final   Staphylococcus lugdunensis NOT DETECTED NOT DETECTED Final   Streptococcus species DETECTED (A) NOT DETECTED Final    Comment: CRITICAL RESULT CALLED TO, READ BACK BY AND VERIFIED WITH: Malva Cogan PHARMD  01/06/21 EB    Streptococcus agalactiae DETECTED (A) NOT DETECTED  Final    Comment: CRITICAL RESULT CALLED TO, READ BACK BY AND VERIFIED WITH: Malva Cogan PHARMD  01/06/21 EB    Streptococcus pneumoniae NOT DETECTED NOT DETECTED Final   Streptococcus pyogenes NOT DETECTED NOT DETECTED Final   A.calcoaceticus-baumannii NOT DETECTED NOT DETECTED Final   Bacteroides fragilis NOT DETECTED NOT DETECTED Final   Enterobacterales DETECTED (A) NOT DETECTED Final    Comment: Enterobacterales represent a large order of gram negative bacteria, not a single organism. CRITICAL RESULT CALLED TO, READ BACK BY AND VERIFIED WITH: Malva Cogan PHARMD  01/06/21 EB    Enterobacter cloacae complex NOT DETECTED NOT DETECTED Final   Escherichia coli NOT DETECTED NOT DETECTED Final   Klebsiella aerogenes NOT DETECTED NOT DETECTED Final   Klebsiella oxytoca NOT DETECTED NOT DETECTED Final   Klebsiella pneumoniae NOT DETECTED NOT DETECTED Final   Proteus species DETECTED (A) NOT DETECTED Final    Comment: CRITICAL RESULT CALLED TO, READ BACK BY AND VERIFIED WITH: KIM HURTH PHARMD  01/06/21 EB    Salmonella species NOT DETECTED NOT DETECTED Final   Serratia marcescens  NOT DETECTED NOT DETECTED Final   Haemophilus influenzae NOT DETECTED NOT DETECTED Final   Neisseria meningitidis NOT DETECTED NOT DETECTED Final   Pseudomonas aeruginosa NOT DETECTED NOT DETECTED Final   Stenotrophomonas maltophilia NOT DETECTED NOT DETECTED Final   Candida albicans NOT DETECTED NOT DETECTED Final   Candida auris NOT DETECTED NOT DETECTED Final   Candida glabrata NOT DETECTED NOT DETECTED Final   Candida krusei NOT DETECTED NOT DETECTED Final   Candida parapsilosis NOT DETECTED NOT DETECTED Final   Candida tropicalis NOT DETECTED NOT DETECTED Final   Cryptococcus neoformans/gattii NOT DETECTED NOT DETECTED Final   CTX-M ESBL NOT DETECTED NOT DETECTED Final   Carbapenem resistance IMP NOT DETECTED NOT DETECTED Final   Carbapenem resistance KPC NOT DETECTED NOT DETECTED Final    Carbapenem resistance NDM NOT DETECTED NOT DETECTED Final   Carbapenem resist OXA 48 LIKE NOT DETECTED NOT DETECTED Final   Vancomycin resistance DETECTED (A) NOT DETECTED Final    Comment: CRITICAL RESULT CALLED TO, READ BACK BY AND VERIFIED WITH: KIM HURTH PHARMD @1910  01/06/21 EB    Carbapenem resistance VIM NOT DETECTED NOT DETECTED Final    Comment: Performed at [facility], [address] [place], [place] [address]  Blood Culture (routine x 2)     Status: None (Preliminary result)   Collection Time: 01/06/21  3:52 AM   Specimen: BLOOD  Result Value Ref Range Status   Specimen Description BLOOD SITE NOT SPECIFIED  Final   Special Requests   Final    BOTTLES DRAWN AEROBIC AND ANAEROBIC Blood Culture adequate volume   Culture   Final    NO GROWTH 2 DAYS Performed at [facility], [address], [place], [place] [address]    Report Status PENDING  Incomplete  Resp Panel by RT-PCR (Flu A&B, Covid) Nasopharyngeal Swab     Status: None   Collection Time: 01/06/21  4:37 AM   Specimen: Nasopharyngeal Swab; Nasopharyngeal(NP) swabs in vial transport medium  Result Value Ref Range Status   SARS Coronavirus 2 by RT PCR NEGATIVE NEGATIVE Final    Comment: (NOTE) SARS-CoV-2 target nucleic acids are NOT DETECTED.  The SARS-CoV-2 RNA is generally detectable in upper respiratory specimens during the acute phase of infection. The lowest concentration of SARS-CoV-2 viral copies this assay can detect is 138 copies/mL. A negative result does not preclude SARS-Cov-2 infection and should not be used as the sole basis for treatment or other patient management decisions. A negative result may occur with  improper specimen collection/handling, submission of specimen other than nasopharyngeal swab, presence of viral mutation(s) within the areas targeted by this assay, and inadequate number of viral copies(<138 copies/mL). A negative result must be combined with clinical observations,  patient history, and epidemiological information. The expected result is Negative.  Fact Sheet for Patients:  BloggerCourse.com  Fact Sheet for Healthcare Providers:  SeriousBroker.it  This test is no t yet approved or cleared by the [place] FDA and  has been authorized for detection and/or diagnosis of SARS-CoV-2 by FDA under an Emergency Use Authorization (EUA). This EUA will remain  in effect (meaning this test can be used) for the duration of the COVID-19 declaration under Section 564(b)(1) of the Act, 21 U.S.C.section 360bbb-3(b)(1), unless the authorization is terminated  or revoked sooner.       Influenza A by PCR NEGATIVE NEGATIVE Final   Influenza B by PCR NEGATIVE NEGATIVE Final    Comment: (NOTE) The Xpert Xpress SARS-CoV-2/FLU/RSV  plus assay is intended as an aid in the diagnosis of influenza from Nasopharyngeal swab specimens and should not be used as a sole basis for treatment. Nasal washings and aspirates are unacceptable for Xpert Xpress SARS-CoV-2/FLU/RSV testing.  Fact Sheet for Patients: BloggerCourse.com  Fact Sheet for Healthcare Providers: SeriousBroker.it  This test is not yet approved or cleared by the [place] FDA and has been authorized for detection and/or diagnosis of SARS-CoV-2 by FDA under an Emergency Use Authorization (EUA). This EUA will remain in effect (meaning this test can be used) for the duration of the COVID-19 declaration under Section 564(b)(1) of the Act, 21 U.S.C. section 360bbb-3(b)(1), unless the authorization is terminated or revoked.  Performed at [facility], [address], [place], [place] [address]   Culture, blood (routine x 2)     Status: None (Preliminary result)   Collection Time: 01/07/21 12:14 PM   Specimen: BLOOD  Result Value Ref Range Status   Specimen Description BLOOD SITE NOT SPECIFIED   Final   Special Requests   Final    BOTTLES DRAWN AEROBIC ONLY Blood Culture results may not be optimal due to an inadequate volume of blood received in culture bottles   Culture  Setup Time   Final    AEROBIC BOTTLE ONLY GRAM POSITIVE COCCI CRITICAL RESULT CALLED TO, READ BACK BY AND VERIFIED WITH: L SEAY PHARMD 01/09/21 0344 JDW    Culture   Final    NO GROWTH < 24 HOURS Performed at [facility], [address], [place], [place] [address]    Report Status PENDING  Incomplete  Culture, blood (routine x 2)     Status: None (Preliminary result)   Collection Time: 01/07/21 12:14 PM   Specimen: BLOOD  Result Value Ref Range Status   Specimen Description BLOOD SITE NOT SPECIFIED  Final   Special Requests   Final    BOTTLES DRAWN AEROBIC ONLY Blood Culture results may not be optimal due to an inadequate volume of blood received in culture bottles   Culture   Final    NO GROWTH < 24 HOURS Performed at [facility], [address] [place], [place] [address]    Report Status PENDING  Incomplete  Blood Culture ID Panel (Reflexed)     Status: Abnormal   Collection Time: 01/07/21 12:14 PM  Result Value Ref Range Status   Enterococcus faecalis NOT DETECTED NOT DETECTED Final   Enterococcus Faecium NOT DETECTED NOT DETECTED Final   Listeria monocytogenes NOT DETECTED NOT DETECTED Final   Staphylococcus species DETECTED (A) NOT DETECTED Final    Comment: CRITICAL RESULT CALLED TO, READ BACK BY AND VERIFIED WITH: L SEAY PHARMD 01/09/21 0344 JDW    Staphylococcus aureus (BCID) NOT DETECTED NOT DETECTED Final   Staphylococcus epidermidis DETECTED (A) NOT DETECTED Final    Comment: Methicillin (oxacillin) resistant coagulase negative staphylococcus. Possible blood culture contaminant (unless isolated from more than one blood culture draw or clinical case suggests pathogenicity). No antibiotic treatment is indicated for blood  culture contaminants. CRITICAL RESULT CALLED TO, READ BACK BY  AND VERIFIED WITH: L SEAY PHARMD 01/09/21 0344 JDW    Staphylococcus lugdunensis NOT DETECTED NOT DETECTED Final   Streptococcus species NOT DETECTED NOT DETECTED Final   Streptococcus agalactiae NOT DETECTED NOT DETECTED Final   Streptococcus pneumoniae NOT DETECTED NOT DETECTED Final   Streptococcus pyogenes NOT DETECTED NOT DETECTED Final   A.calcoaceticus-baumannii NOT DETECTED NOT DETECTED Final   Bacteroides fragilis  NOT DETECTED NOT DETECTED Final   Enterobacterales NOT DETECTED NOT DETECTED Final   Enterobacter cloacae complex NOT DETECTED NOT DETECTED Final   Escherichia coli NOT DETECTED NOT DETECTED Final   Klebsiella aerogenes NOT DETECTED NOT DETECTED Final   Klebsiella oxytoca NOT DETECTED NOT DETECTED Final   Klebsiella pneumoniae NOT DETECTED NOT DETECTED Final   Proteus species NOT DETECTED NOT DETECTED Final   Salmonella species NOT DETECTED NOT DETECTED Final   Serratia marcescens NOT DETECTED NOT DETECTED Final   Haemophilus influenzae NOT DETECTED NOT DETECTED Final   Neisseria meningitidis NOT DETECTED NOT DETECTED Final   Pseudomonas aeruginosa NOT DETECTED NOT DETECTED Final   Stenotrophomonas maltophilia NOT DETECTED NOT DETECTED Final   Candida albicans NOT DETECTED NOT DETECTED Final   Candida auris NOT DETECTED NOT DETECTED Final   Candida glabrata NOT DETECTED NOT DETECTED Final   Candida krusei NOT DETECTED NOT DETECTED Final   Candida parapsilosis NOT DETECTED NOT DETECTED Final   Candida tropicalis NOT DETECTED NOT DETECTED Final   Cryptococcus neoformans/gattii NOT DETECTED NOT DETECTED Final   Methicillin resistance mecA/C DETECTED (A) NOT DETECTED Final    Comment: CRITICAL RESULT CALLED TO, READ BACK BY AND VERIFIED WITH: L SEAY PHARMD 01/09/21 0344 JDW Performed at [facility], [address], [place], [place] [address]       Radiology Studies: CT ANGIO CHEST PE W OR WO CONTRAST  Result Date: 01/07/2021 CLINICAL DATA:  Hypoxemia and  bacteremia EXAM: CT ANGIOGRAPHY CHEST CT ABDOMEN AND PELVIS WITH CONTRAST TECHNIQUE: Multidetector CT imaging of the chest was performed using the standard protocol during bolus administration of intravenous contrast. Multiplanar CT image reconstructions and MIPs were obtained to evaluate the vascular anatomy. Multidetector CT imaging of the abdomen and pelvis was performed using the standard protocol during bolus administration of intravenous contrast. CONTRAST:  68mL OMNIPAQUE IOHEXOL 350 MG/ML SOLN COMPARISON:  Ultrasound 01/07/2021 radiograph 01/06/2021 CT 1222 FINDINGS: CTA CHEST FINDINGS Cardiovascular: Satisfactory opacification of pulmonary arteries. No visible pulmonary arterial filling defects though respiratory motion may limit detection of smaller segmental and subsegmental filling defects. Central pulmonary arteries are enlarged though similar to prior caliber. Cardiac size is within normal limits. No pericardial effusion. Three-vessel coronary artery atherosclerosis. The aortic root is suboptimally assessed given cardiac pulsation artifact. Atherosclerotic plaque throughout the thoracic aorta. Dilatation of the distal thoracic aortic arch to 3.6 cm, returning to a more normal caliber by the level of the diaphragmatic hiatus of 2.9 cm. Mild aortic tortuosity, can be senescent. No acute luminal abnormality of the imaged aorta within the limitations of the suboptimally opacified exam. No periaortic stranding or hemorrhage. Normal 3 vessel branching of the aortic arch with calcifications in the proximal great vessels. No major venous abnormalities. Mediastinum/Nodes: No mediastinal fluid or gas. Normal thyroid gland and thoracic inlet. No acute abnormality of the trachea or esophagus. No worrisome mediastinal, hilar or axillary adenopathy. Lungs/Pleura: Low volumes and atelectasis on water likely mild emphysematous changes. Some additional vascular redistribution, septal thickening is noted as well, can  reflect mild interstitial edema. Trace pleural effusions, right greater than left, with dependent and passive atelectatic changes in the lung bases. No concerning pulmonary nodules or masses. Additional bandlike areas of opacity likely reflect scarring and or atelectasis. Musculoskeletal: Multilevel degenerative changes are present in the imaged portions of the spine. Likely remote anterior wedging deformities T10, T12, unchanged from comparison. Superimposed Schmorl's node formations and vacuum disc phenomenon. No acute fracture or or conspicuous osseous abnormality  is seen. Mild body wall edema. No worrisome chest wall mass or lesion. Benign macro calcifications seen in the breast tissues, suspect many of which are mass killer. Review of the MIP images confirms the above findings. CT ABDOMEN and PELVIS FINDINGS Hepatobiliary: No worrisome focal liver lesions. Smooth liver surface contour. Normal hepatic attenuation. Gallbladder decompressed. No pericholecystic fluid or inflammation. No visible calcified gallstone or biliary ductal dilatation. Pancreas: No pancreatic ductal dilatation or surrounding inflammatory changes. Spleen: Normal in size. No concerning splenic lesions. Adrenals/Urinary Tract: Normal adrenals. Kidneys enhance and excrete symmetrically. Bilateral extrarenal pelves. Question some mild bladder wall hyperemia and urothelial thickening which could reflect ascending infection in the appropriate clinical context. Additionally, there is a punctate radiodensity layering in the left posterolateral bladder, separate from the ureterovesicular junction, suggestive of a layering bladder calculus. Stomach/Bowel: Extensive vascular collateralization in varices formation about the distal esophagus and proximal stomach. Percutaneous gastrostomy tube in place with adequate pexy of the anterior wall of the gastric body to the anterior abdominal wall. No acute complication. Normally seated balloon. Duodenum is  unremarkable accounting for distension. No small bowel thickening or dilatation is seen. High attenuation enteric contrast media traverses through the colon to the level of the rectal vault, lack of formed stool, can be seen with rapid transit state. Scattered colonic diverticula without focal inflammation to suggest diverticulitis. 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 Atherosclerotic calcifications within the abdominal aorta and branch vessels. No other aneurysm or ectasia. No enlarged abdominopelvic lymph nodes. Reproductive: Anteverted uterus. No concerning adnexal lesions. There is slight asymmetric prominence of the left gonadal vein and parametrial vasculature, nonspecific though can be seen in the setting of pelvic congestion. Other: Stranding and inflammatory changes appears centered upon the right psoas musculature with some additional hazy stranding distributed in the retroperitoneum. Possibly reactive change. No free air. No bowel containing hernia. Musculoskeletal: Interval contraction of the previously expanded right psoas musculature albeit with a persistent hypoattenuating focus centrally within the muscle belly (6/45, 3/53), can reflect a residual intramuscular hematoma or abscess in the setting of sepsis. No other intramuscular collection is seen. Multilevel degenerative changes are noted in the lumbar levels. Levocurvature apex L2-3. Associated pelvic tilt. Degenerative changes in the hips and pelvis as well. No acute or conspicuous osseous lesions. Review of the MIP images confirms the above findings. IMPRESSION: 1. No evidence of acute pulmonary embolism though respiratory motion may limit detection of smaller segmental and subsegmental filling defects. 2. Enlarged central pulmonary arteries, suggestive of pulmonary arterial hypertension. 3. Trace pleural effusions, right greater than left, with dependent and passive atelectatic  changes in the lung bases. 4. Question some mild bladder wall hyperemia and urothelial thickening which could reflect ascending infection in the appropriate clinical context. Correlate with urinalysis. 5. Interval contraction of the previously expanded right psoas musculature albeit with a persistent hypoattenuating focus centrally within the muscle belly, can reflect a residual intramuscular hematoma or abscess in the setting of sepsis. Stranding and inflammatory changes appears centered upon the right psoas musculature with some additional hazy stranding distributed in the retroperitoneum, which could reflect reactive change. 6. High attenuation enteric contrast media traverses through the colon to the level of the rectal vault, lack of formed stool, can be seen with rapid transit state. 7. Dilatation of the aortic arch to 3.6 cm. Recommend annual imaging followup by CTA or MRA. This recommendation follows 2010 ACCF/AHA/AATS/ACR/ASA/SCA/SCAI/SIR/STS/SVM Guidelines for the Diagnosis and Management of Patients with Thoracic  Aortic Disease. Circulation.2010; 121: R604-V409. Aortic aneurysm NOS (ICD10-I71.9) 8. 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 Recommend follow-up ultrasound every 3 years. This recommendation follows ACR consensus guidelines: White Paper of the ACR Incidental Findings Committee II on Vascular Findings. J Am Coll Radiol 2013; 10:789-794. 9. Aortic Atherosclerosis (ICD10-I70.0). 10. Coronary artery calcifications are present. Please note that the presence of coronary artery calcium documents the presence of coronary artery disease, the severity of this disease and any potential stenosis cannot be assessed on this non-gated CT examination. Electronically Signed   By: Kreg Shropshire M.D.   On: 01/07/2021 19:57   CT ABDOMEN PELVIS W CONTRAST  Result Date: 01/07/2021 CLINICAL DATA:  Hypoxemia and bacteremia EXAM: CT ANGIOGRAPHY CHEST CT ABDOMEN AND PELVIS WITH CONTRAST  TECHNIQUE: Multidetector CT imaging of the chest was performed using the standard protocol during bolus administration of intravenous contrast. Multiplanar CT image reconstructions and MIPs were obtained to evaluate the vascular anatomy. Multidetector CT imaging of the abdomen and pelvis was performed using the standard protocol during bolus administration of intravenous contrast. CONTRAST:  99mL OMNIPAQUE IOHEXOL 350 MG/ML SOLN COMPARISON:  Ultrasound 01/07/2021 radiograph 01/06/2021 CT 1222 FINDINGS: CTA CHEST FINDINGS Cardiovascular: Satisfactory opacification of pulmonary arteries. No visible pulmonary arterial filling defects though respiratory motion may limit detection of smaller segmental and subsegmental filling defects. Central pulmonary arteries are enlarged though similar to prior caliber. Cardiac size is within normal limits. No pericardial effusion. Three-vessel coronary artery atherosclerosis. The aortic root is suboptimally assessed given cardiac pulsation artifact. Atherosclerotic plaque throughout the thoracic aorta. Dilatation of the distal thoracic aortic arch to 3.6 cm, returning to a more normal caliber by the level of the diaphragmatic hiatus of 2.9 cm. Mild aortic tortuosity, can be senescent. No acute luminal abnormality of the imaged aorta within the limitations of the suboptimally opacified exam. No periaortic stranding or hemorrhage. Normal 3 vessel branching of the aortic arch with calcifications in the proximal great vessels. No major venous abnormalities. Mediastinum/Nodes: No mediastinal fluid or gas. Normal thyroid gland and thoracic inlet. No acute abnormality of the trachea or esophagus. No worrisome mediastinal, hilar or axillary adenopathy. Lungs/Pleura: Low volumes and atelectasis on water likely mild emphysematous changes. Some additional vascular redistribution, septal thickening is noted as well, can reflect mild interstitial edema. Trace pleural effusions, right greater than  left, with dependent and passive atelectatic changes in the lung bases. No concerning pulmonary nodules or masses. Additional bandlike areas of opacity likely reflect scarring and or atelectasis. Musculoskeletal: Multilevel degenerative changes are present in the imaged portions of the spine. Likely remote anterior wedging deformities T10, T12, unchanged from comparison. Superimposed Schmorl's node formations and vacuum disc phenomenon. No acute fracture or or conspicuous osseous abnormality is seen. Mild body wall edema. No worrisome chest wall mass or lesion. Benign macro calcifications seen in the breast tissues, suspect many of which are mass killer. Review of the MIP images confirms the above findings. CT ABDOMEN and PELVIS FINDINGS Hepatobiliary: No worrisome focal liver lesions. Smooth liver surface contour. Normal hepatic attenuation. Gallbladder decompressed. No pericholecystic fluid or inflammation. No visible calcified gallstone or biliary ductal dilatation. Pancreas: No pancreatic ductal dilatation or surrounding inflammatory changes. Spleen: Normal in size. No concerning splenic lesions. Adrenals/Urinary Tract: Normal adrenals. Kidneys enhance and excrete symmetrically. Bilateral extrarenal pelves. Question some mild bladder wall hyperemia and urothelial thickening which could reflect ascending infection in the appropriate clinical context. Additionally, there is a punctate radiodensity layering in the left posterolateral bladder,  separate from the ureterovesicular junction, suggestive of a layering bladder calculus. Stomach/Bowel: Extensive vascular collateralization in varices formation about the distal esophagus and proximal stomach. Percutaneous gastrostomy tube in place with adequate pexy of the anterior wall of the gastric body to the anterior abdominal wall. No acute complication. Normally seated balloon. Duodenum is unremarkable accounting for distension. No small bowel thickening or dilatation  is seen. High attenuation enteric contrast media traverses through the colon to the level of the rectal vault, lack of formed stool, can be seen with rapid transit state. Scattered colonic diverticula without focal inflammation to suggest diverticulitis. 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 Atherosclerotic calcifications within the abdominal aorta and branch vessels. No other aneurysm or ectasia. No enlarged abdominopelvic lymph nodes. Reproductive: Anteverted uterus. No concerning adnexal lesions. There is slight asymmetric prominence of the left gonadal vein and parametrial vasculature, nonspecific though can be seen in the setting of pelvic congestion. Other: Stranding and inflammatory changes appears centered upon the right psoas musculature with some additional hazy stranding distributed in the retroperitoneum. Possibly reactive change. No free air. No bowel containing hernia. Musculoskeletal: Interval contraction of the previously expanded right psoas musculature albeit with a persistent hypoattenuating focus centrally within the muscle belly (6/45, 3/53), can reflect a residual intramuscular hematoma or abscess in the setting of sepsis. No other intramuscular collection is seen. Multilevel degenerative changes are noted in the lumbar levels. Levocurvature apex L2-3. Associated pelvic tilt. Degenerative changes in the hips and pelvis as well. No acute or conspicuous osseous lesions. Review of the MIP images confirms the above findings. IMPRESSION: 1. No evidence of acute pulmonary embolism though respiratory motion may limit detection of smaller segmental and subsegmental filling defects. 2. Enlarged central pulmonary arteries, suggestive of pulmonary arterial hypertension. 3. Trace pleural effusions, right greater than left, with dependent and passive atelectatic changes in the lung bases. 4. Question some mild bladder wall hyperemia and  urothelial thickening which could reflect ascending infection in the appropriate clinical context. Correlate with urinalysis. 5. Interval contraction of the previously expanded right psoas musculature albeit with a persistent hypoattenuating focus centrally within the muscle belly, can reflect a residual intramuscular hematoma or abscess in the setting of sepsis. Stranding and inflammatory changes appears centered upon the right psoas musculature with some additional hazy stranding distributed in the retroperitoneum, which could reflect reactive change. 6. High attenuation enteric contrast media traverses through the colon to the level of the rectal vault, lack of formed stool, can be seen with rapid transit state. 7. Dilatation of the aortic arch to 3.6 cm. Recommend annual imaging followup by CTA or MRA. This recommendation follows 2010 ACCF/AHA/AATS/ACR/ASA/SCA/SCAI/SIR/STS/SVM Guidelines for the Diagnosis and Management of Patients with Thoracic Aortic Disease. Circulation.2010; 121: W098-J191. Aortic aneurysm NOS (ICD10-I71.9) 8. 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 Recommend follow-up ultrasound every 3 years. This recommendation follows ACR consensus guidelines: White Paper of the ACR Incidental Findings Committee II on Vascular Findings. J Am Coll Radiol 2013; 10:789-794. 9. Aortic Atherosclerosis (ICD10-I70.0). 10. Coronary artery calcifications are present. Please note that the presence of coronary artery calcium documents the presence of coronary artery disease, the severity of this disease and any potential stenosis cannot be assessed on this non-gated CT examination. Electronically Signed   By: Kreg Shropshire M.D.   On: 01/07/2021 19:57     Scheduled Meds: . enoxaparin (LOVENOX) injection  40 mg Subcutaneous Q24H  . feeding supplement (PROSource TF)  45  mL Per Tube Daily  . free water  200 mL Per Tube Q6H  . lactulose  20 g Per Tube BID  . levothyroxine  125 mcg  Per Tube QAC breakfast  . multivitamin with minerals  1 tablet Per Tube Daily  . nystatin ointment  1 application Topical BID  . pantoprazole sodium  40 mg Per Tube BID  . rifaximin  550 mg Per Tube BID  . thiamine  100 mg Per Tube Daily   Continuous Infusions: . sodium chloride Stopped (01/08/21 2057)  . feeding supplement (OSMOLITE 1.2 CAL) 1,000 mL (01/09/21 0332)     LOS: 3 days   Time Spent in minutes   30 minutes  Tarea Skillman D.O. on 01/09/2021 at 11:10 AM  Between 7am to 7pm - Please see pager noted on [email or web address]  After 7pm go to [email or web address]  And look for the night coverage person covering for me after hours  [facility] Office  [phone]

## 2021-01-09 NOTE — Progress Notes (Signed)
   Received notification from RN Lafonda Mosses regarding daughter Kaylee King's concern for coordination of EGD and GOC meeting, both scheduled for 3pm tomorrow.    Reached out to daughter Elita Quick and expressed willingness to rearrange plans as needed. Pam is agreeable to keep GOC meeting at 3pm, as she feels more comfortable being present in the hospital during patient's procedure. Will proceed as planned.  Thank you for your referral and allowing PMT to assist in Mr./Mrs. Kaylee King care.   Richardson Dopp, PA-C Palliative Medicine Team  Phone: [phone]   NO CHARGE

## 2021-01-09 NOTE — Progress Notes (Addendum)
Daily Rounding Note  01/09/2021, 9:18 AM  LOS: 3 days   SUBJECTIVE:   Chief complaint: upper GIB     Liquid, green-tinged stools.  No n/v, no abd pain, no passing of blood.    OBJECTIVE:         Vital signs in last 24 hours:    Temp:  [97.4 F (36.3 C)-98.7 F (37.1 C)] 98.5 F (36.9 C) (03/08 0740) Pulse Rate:  [78] 78 (03/07 1614) Resp:  [17-24] 19 (03/08 0500) BP: (119-130)/(53-79) 123/58 (03/08 0400) SpO2:  [93 %-100 %] 98 % (03/08 0500) Weight:  [69.9 kg-70.1 kg] 69.9 kg (03/08 0338) Last BM Date: 01/08/21 Filed Weights   01/08/21 0127 01/09/21 0007 01/09/21 0338  Weight: 71.7 kg 70.1 kg 69.9 kg   General: sleeping, aroused briefly during exam   Heart: RRR Chest: clear bil.  No labored breathing of cough Abdomen: soft, NT, ND.  G tube site benign.  Extremities: no CCE Neuro/Psych:  Made no vigorous attempt to awaken pt.  No agitation.  Soft wrist restraints no longer in place.    Lab Results: Recent Labs    01/07/21 0300 01/08/21 0432 01/09/21 0340  WBC 8.2 6.9 6.0  HGB 8.4* 8.1* 8.0*  HCT 27.8* 27.4* 26.9*  PLT 409* 422* 395   BMET Recent Labs    01/07/21 0300 01/08/21 0432 01/09/21 0340  NA 137 134* 135  K 5.1 4.2 4.9  CL 106 106 105  CO2 21* 18* 22  GLUCOSE 80 126* 127*  BUN _0 CREATININE 0.95 0.87 0.72  CALCIUM 8.5* 8.2* 8.1*   LFT Recent Labs    01/07/21 0300 01/08/21 0432  PROT 6.5 6.7  ALBUMIN 2.1* 2.1*  AST 52* 50*  ALT 42 44  ALKPHOS 211* 206*  BILITOT 1.0 0.7   PT/INR No results for input(s): LABPROT, INR in the last 72 hours. Hepatitis Panel No results for input(s): HEPBSAG, HCVAB, HEPAIGM, HEPBIGM in the last 72 hours.  Studies/Results: CT ANGIO CHEST PE W OR WO CONTRAST  Result Date: 01/07/2021 CLINICAL DATA:  Hypoxemia and bacteremia EXAM: CT ANGIOGRAPHY CHEST CT ABDOMEN AND PELVIS WITH CONTRAST TECHNIQUE: Multidetector CT imaging of the chest was  performed using the standard protocol during bolus administration of intravenous contrast. Multiplanar CT image reconstructions and MIPs were obtained to evaluate the vascular anatomy. Multidetector CT imaging of the abdomen and pelvis was performed using the standard protocol during bolus administration of intravenous contrast. CONTRAST:  34m OMNIPAQUE IOHEXOL 350 MG/ML SOLN COMPARISON:  Ultrasound 01/07/2021 radiograph 01/06/2021 CT 1222 FINDINGS: CTA CHEST FINDINGS Cardiovascular: Satisfactory opacification of pulmonary arteries. No visible pulmonary arterial filling defects though respiratory motion may limit detection of smaller segmental and subsegmental filling defects. Central pulmonary arteries are enlarged though similar to prior caliber. Cardiac size is within normal limits. No pericardial effusion. Three-vessel coronary artery atherosclerosis. The aortic root is suboptimally assessed given cardiac pulsation artifact. Atherosclerotic plaque throughout the thoracic aorta. Dilatation of the distal thoracic aortic arch to 3.6 cm, returning to a more normal caliber by the level of the diaphragmatic hiatus of 2.9 cm. Mild aortic tortuosity, can be senescent. No acute luminal abnormality of the imaged aorta within the limitations of the suboptimally opacified exam. No periaortic stranding or hemorrhage. Normal 3 vessel branching of the aortic arch with calcifications in the proximal great vessels. No major venous abnormalities. Mediastinum/Nodes: No mediastinal fluid or gas. Normal thyroid gland and thoracic inlet. No  acute abnormality of the trachea or esophagus. No worrisome mediastinal, hilar or axillary adenopathy. Lungs/Pleura: Low volumes and atelectasis on water likely mild emphysematous changes. Some additional vascular redistribution, septal thickening is noted as well, can reflect mild interstitial edema. Trace pleural effusions, right greater than left, with dependent and passive atelectatic changes  in the lung bases. No concerning pulmonary nodules or masses. Additional bandlike areas of opacity likely reflect scarring and or atelectasis. Musculoskeletal: Multilevel degenerative changes are present in the imaged portions of the spine. Likely remote anterior wedging deformities T10, T12, unchanged from comparison. Superimposed Schmorl's node formations and vacuum disc phenomenon. No acute fracture or or conspicuous osseous abnormality is seen. Mild body wall edema. No worrisome chest wall mass or lesion. Benign macro calcifications seen in the breast tissues, suspect many of which are mass killer. Review of the MIP images confirms the above findings. CT ABDOMEN and PELVIS FINDINGS Hepatobiliary: No worrisome focal liver lesions. Smooth liver surface contour. Normal hepatic attenuation. Gallbladder decompressed. No pericholecystic fluid or inflammation. No visible calcified gallstone or biliary ductal dilatation. Pancreas: No pancreatic ductal dilatation or surrounding inflammatory changes. Spleen: Normal in size. No concerning splenic lesions. Adrenals/Urinary Tract: Normal adrenals. Kidneys enhance and excrete symmetrically. Bilateral extrarenal pelves. Question some mild bladder wall hyperemia and urothelial thickening which could reflect ascending infection in the appropriate clinical context. Additionally, there is a punctate radiodensity layering in the left posterolateral bladder, separate from the ureterovesicular junction, suggestive of a layering bladder calculus. Stomach/Bowel: Extensive vascular collateralization in varices formation about the distal esophagus and proximal stomach. Percutaneous gastrostomy tube in place with adequate pexy of the anterior wall of the gastric body to the anterior abdominal wall. No acute complication. Normally seated balloon. Duodenum is unremarkable accounting for distension. No small bowel thickening or dilatation is seen. High attenuation enteric contrast media  traverses through the colon to the level of the rectal vault, lack of formed stool, can be seen with rapid transit state. Scattered colonic diverticula without focal inflammation to suggest diverticulitis. 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 Atherosclerotic calcifications within the abdominal aorta and branch vessels. No other aneurysm or ectasia. No enlarged abdominopelvic lymph nodes. Reproductive: Anteverted uterus. No concerning adnexal lesions. There is slight asymmetric prominence of the left gonadal vein and parametrial vasculature, nonspecific though can be seen in the setting of pelvic congestion. Other: Stranding and inflammatory changes appears centered upon the right psoas musculature with some additional hazy stranding distributed in the retroperitoneum. Possibly reactive change. No free air. No bowel containing hernia. Musculoskeletal: Interval contraction of the previously expanded right psoas musculature albeit with a persistent hypoattenuating focus centrally within the muscle belly (6/45, 3/53), can reflect a residual intramuscular hematoma or abscess in the setting of sepsis. No other intramuscular collection is seen. Multilevel degenerative changes are noted in the lumbar levels. Levocurvature apex L2-3. Associated pelvic tilt. Degenerative changes in the hips and pelvis as well. No acute or conspicuous osseous lesions. Review of the MIP images confirms the above findings. IMPRESSION: 1. No evidence of acute pulmonary embolism though respiratory motion may limit detection of smaller segmental and subsegmental filling defects. 2. Enlarged central pulmonary arteries, suggestive of pulmonary arterial hypertension. 3. Trace pleural effusions, right greater than left, with dependent and passive atelectatic changes in the lung bases. 4. Question some mild bladder wall hyperemia and urothelial thickening which could reflect ascending  infection in the appropriate clinical context. Correlate with urinalysis. 5. Interval contraction of the  previously expanded right psoas musculature albeit with a persistent hypoattenuating focus centrally within the muscle belly, can reflect a residual intramuscular hematoma or abscess in the setting of sepsis. Stranding and inflammatory changes appears centered upon the right psoas musculature with some additional hazy stranding distributed in the retroperitoneum, which could reflect reactive change. 6. High attenuation enteric contrast media traverses through the colon to the level of the rectal vault, lack of formed stool, can be seen with rapid transit state. 7. Dilatation of the aortic arch to 3.6 cm. Recommend annual imaging followup by CTA or MRA. This recommendation follows 2010 ACCF/AHA/AATS/ACR/ASA/SCA/SCAI/SIR/STS/SVM Guidelines for the Diagnosis and Management of Patients with Thoracic Aortic Disease. Circulation.2010; 121: B017-P102. Aortic aneurysm NOS (ICD10-I71.9) 8. 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 Recommend follow-up ultrasound every 3 years. This recommendation follows ACR consensus guidelines: White Paper of the ACR Incidental Findings Committee II on Vascular Findings. J Am Coll Radiol 2013; 10:789-794. 9. Aortic Atherosclerosis (ICD10-I70.0). 10. Coronary artery calcifications are present. Please note that the presence of coronary artery calcium documents the presence of coronary artery disease, the severity of this disease and any potential stenosis cannot be assessed on this non-gated CT examination. Electronically Signed   By: Lovena Le M.D.   On: 01/07/2021 19:57   CT ABDOMEN PELVIS W CONTRAST  Result Date: 01/07/2021 CLINICAL DATA:  Hypoxemia and bacteremia EXAM: CT ANGIOGRAPHY CHEST CT ABDOMEN AND PELVIS WITH CONTRAST TECHNIQUE: Multidetector CT imaging of the chest was performed using the standard protocol during bolus administration of  intravenous contrast. Multiplanar CT image reconstructions and MIPs were obtained to evaluate the vascular anatomy. Multidetector CT imaging of the abdomen and pelvis was performed using the standard protocol during bolus administration of intravenous contrast. CONTRAST:  76m OMNIPAQUE IOHEXOL 350 MG/ML SOLN COMPARISON:  Ultrasound 01/07/2021 radiograph 01/06/2021 CT 1222 FINDINGS: CTA CHEST FINDINGS Cardiovascular: Satisfactory opacification of pulmonary arteries. No visible pulmonary arterial filling defects though respiratory motion may limit detection of smaller segmental and subsegmental filling defects. Central pulmonary arteries are enlarged though similar to prior caliber. Cardiac size is within normal limits. No pericardial effusion. Three-vessel coronary artery atherosclerosis. The aortic root is suboptimally assessed given cardiac pulsation artifact. Atherosclerotic plaque throughout the thoracic aorta. Dilatation of the distal thoracic aortic arch to 3.6 cm, returning to a more normal caliber by the level of the diaphragmatic hiatus of 2.9 cm. Mild aortic tortuosity, can be senescent. No acute luminal abnormality of the imaged aorta within the limitations of the suboptimally opacified exam. No periaortic stranding or hemorrhage. Normal 3 vessel branching of the aortic arch with calcifications in the proximal great vessels. No major venous abnormalities. Mediastinum/Nodes: No mediastinal fluid or gas. Normal thyroid gland and thoracic inlet. No acute abnormality of the trachea or esophagus. No worrisome mediastinal, hilar or axillary adenopathy. Lungs/Pleura: Low volumes and atelectasis on water likely mild emphysematous changes. Some additional vascular redistribution, septal thickening is noted as well, can reflect mild interstitial edema. Trace pleural effusions, right greater than left, with dependent and passive atelectatic changes in the lung bases. No concerning pulmonary nodules or masses.  Additional bandlike areas of opacity likely reflect scarring and or atelectasis. Musculoskeletal: Multilevel degenerative changes are present in the imaged portions of the spine. Likely remote anterior wedging deformities T10, T12, unchanged from comparison. Superimposed Schmorl's node formations and vacuum disc phenomenon. No acute fracture or or conspicuous osseous abnormality is seen. Mild body wall edema. No worrisome chest wall mass or lesion. Benign macro calcifications  seen in the breast tissues, suspect many of which are mass killer. Review of the MIP images confirms the above findings. CT ABDOMEN and PELVIS FINDINGS Hepatobiliary: No worrisome focal liver lesions. Smooth liver surface contour. Normal hepatic attenuation. Gallbladder decompressed. No pericholecystic fluid or inflammation. No visible calcified gallstone or biliary ductal dilatation. Pancreas: No pancreatic ductal dilatation or surrounding inflammatory changes. Spleen: Normal in size. No concerning splenic lesions. Adrenals/Urinary Tract: Normal adrenals. Kidneys enhance and excrete symmetrically. Bilateral extrarenal pelves. Question some mild bladder wall hyperemia and urothelial thickening which could reflect ascending infection in the appropriate clinical context. Additionally, there is a punctate radiodensity layering in the left posterolateral bladder, separate from the ureterovesicular junction, suggestive of a layering bladder calculus. Stomach/Bowel: Extensive vascular collateralization in varices formation about the distal esophagus and proximal stomach. Percutaneous gastrostomy tube in place with adequate pexy of the anterior wall of the gastric body to the anterior abdominal wall. No acute complication. Normally seated balloon. Duodenum is unremarkable accounting for distension. No small bowel thickening or dilatation is seen. High attenuation enteric contrast media traverses through the colon to the level of the rectal vault, lack  of formed stool, can be seen with rapid transit state. Scattered colonic diverticula without focal inflammation to suggest diverticulitis. 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 Atherosclerotic calcifications within the abdominal aorta and branch vessels. No other aneurysm or ectasia. No enlarged abdominopelvic lymph nodes. Reproductive: Anteverted uterus. No concerning adnexal lesions. There is slight asymmetric prominence of the left gonadal vein and parametrial vasculature, nonspecific though can be seen in the setting of pelvic congestion. Other: Stranding and inflammatory changes appears centered upon the right psoas musculature with some additional hazy stranding distributed in the retroperitoneum. Possibly reactive change. No free air. No bowel containing hernia. Musculoskeletal: Interval contraction of the previously expanded right psoas musculature albeit with a persistent hypoattenuating focus centrally within the muscle belly (6/45, 3/53), can reflect a residual intramuscular hematoma or abscess in the setting of sepsis. No other intramuscular collection is seen. Multilevel degenerative changes are noted in the lumbar levels. Levocurvature apex L2-3. Associated pelvic tilt. Degenerative changes in the hips and pelvis as well. No acute or conspicuous osseous lesions. Review of the MIP images confirms the above findings. IMPRESSION: 1. No evidence of acute pulmonary embolism though respiratory motion may limit detection of smaller segmental and subsegmental filling defects. 2. Enlarged central pulmonary arteries, suggestive of pulmonary arterial hypertension. 3. Trace pleural effusions, right greater than left, with dependent and passive atelectatic changes in the lung bases. 4. Question some mild bladder wall hyperemia and urothelial thickening which could reflect ascending infection in the appropriate clinical context. Correlate with  urinalysis. 5. Interval contraction of the previously expanded right psoas musculature albeit with a persistent hypoattenuating focus centrally within the muscle belly, can reflect a residual intramuscular hematoma or abscess in the setting of sepsis. Stranding and inflammatory changes appears centered upon the right psoas musculature with some additional hazy stranding distributed in the retroperitoneum, which could reflect reactive change. 6. High attenuation enteric contrast media traverses through the colon to the level of the rectal vault, lack of formed stool, can be seen with rapid transit state. 7. Dilatation of the aortic arch to 3.6 cm. Recommend annual imaging followup by CTA or MRA. This recommendation follows 2010 ACCF/AHA/AATS/ACR/ASA/SCA/SCAI/SIR/STS/SVM Guidelines for the Diagnosis and Management of Patients with Thoracic Aortic Disease. Circulation.2010; 121: R604-V409. Aortic aneurysm NOS (ICD10-I71.9) 8. Unchanged infrarenal abdominal aortic aneurysm measuring  up to 3.2 cm. Recommend follow-up ultrasound every 3 years. This recommendation follows ACR consensus guidelines: White Paper of the ACR Incidental Findings Committee II on Vascular Findings. J Am Coll Radiol 2013; 10:789-794. 9. Aortic Atherosclerosis (ICD10-I70.0). 10. Coronary artery calcifications are present. Please note that the presence of coronary artery calcium documents the presence of coronary artery disease, the severity of this disease and any potential stenosis cannot be assessed on this non-gated CT examination. Electronically Signed   By: Lovena Le M.D.   On: 01/07/2021 19:57    Scheduled Meds: . enoxaparin (LOVENOX) injection  40 mg Subcutaneous Q24H  . feeding supplement (PROSource TF)  45 mL Per Tube Daily  . free water  200 mL Per Tube Q6H  . lactulose  20 g Per Tube BID  . levothyroxine  125 mcg Per Tube QAC breakfast  . multivitamin with minerals  1 tablet Per Tube Daily  . nystatin ointment  1 application  Topical BID  . pantoprazole sodium  40 mg Per Tube BID  . rifaximin  550 mg Per Tube BID  . thiamine  100 mg Per Tube Daily   Continuous Infusions: . sodium chloride Stopped (01/08/21 2057)  . feeding supplement (OSMOLITE 1.2 CAL) 1,000 mL (01/09/21 0332)   PRN Meds:.sodium chloride, acetaminophen, albuterol, morphine   ASSESMENT:   *  Hematemesis.  Resolved.  FOBT +. 11/2020 UGIB, hemorrhagic shock due to esoph varices.   11/23/20 EGD: Grade III esophageal varices. Most likely source of GI bleeding. Banded x 6. Clotted blood in gastric fundus, removed. Non-bleeding, Type 1 gastroesophageal varices (GOV1, esophageal varices which extend along the lesser curvature).  Non-bleeding gastric ulcers x 3 with no stigmata of bleeding.  On Protonix 40 IV bid.   Dr Hilarie Fredrickson is outpt GI doc.    *   Recent blood loss anemia.  Received 6 PRBCs, 2 FFP in 11/2020.   Hgb 7.7 >> 6.7 >> 8.1 >> 8.   No overt melena, GIB this admission.    *   NASH cirrhosis.   Normal INR and platelets.   Chronic elevation alk phos Choledocholithiasis, liver abscesses (catheter drainage), 10/2016 ERCP >> Stent placement >> 2018 stent removal/ stone extraction.  No ascites.    *   Dysphagia.  IR placed PEG on  2/14. SLP approved her for D 1/puree, thin liquid diet.      *   AMS, encephalopathy in setting of sepsis +/- HE.  Rifaxin, Lactulose VT in place now and PTA.  Ammonia level has never been elevated over last 2 months.  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    *   UTI w sepsis.  Proteus growing on 1 blood clx, 20 K pseudomonas in urine.   Competed abx.  WBCs normalized.   ? If the finding of psoas hematoma or abscess, could account for multiple species IDd on PCR.     *     Persistent hypoattenuating focus centrally in R psoas, can reflect a residual intramuscular hematoma or abscess in the setting of sepsis. Stranding/ inflammatory changes appears centered R psoas musculature.  Additional hazy stranding in the  retroperitoneum, could reflect reactive change.  *  Hx PV thrombosis.   No blood thinners PTA.    *   Aortic arch dilation, stable abdominal aortic aneurysm.    PLAN   *   EGD.  Tomorrow ?Marland Kitchen     Azucena Freed  01/09/2021, 9:18 AM Phone [phone]  ________________________________________________________________________  Velora Heckler GI MD note:  I personally examined the patient, reviewed the data and agree with the assessment and plan described above. Frail, elderly woman.  Had bleeding esophageal varices banded 6 weeks. I think it is probably prudent to rpeat EGD tomorrow to evaluate if the varices need further banding (standard surveillance after bands are placed).  Fortunately no sign that she has had recurrent bleeding.  We will coordinate.     Owens Loffler, MD [facility] Pager [phone]

## 2021-01-09 NOTE — Progress Notes (Signed)
PHARMACY - PHYSICIAN COMMUNICATION CRITICAL VALUE ALERT - BLOOD CULTURE IDENTIFICATION (BCID)  Kaylee King is an 80 y.o. female who presented to [facility] on 01/06/2021 with a chief complaint of sepsis  Assessment:  1/2 BC with S epi, methacillin resistance detected  Name of physician (or Provider) Contacted: n/a  Current antibiotics: Dapto and cefepime  Changes to prescribed antibiotics recommended:  none  Results for orders placed or performed during the hospital encounter of 01/06/21  Blood Culture ID Panel (Reflexed) (Collected: 01/07/2021 12:14 PM)  Result Value Ref Range   Enterococcus faecalis NOT DETECTED NOT DETECTED   Enterococcus Faecium NOT DETECTED NOT DETECTED   Listeria monocytogenes NOT DETECTED NOT DETECTED   Staphylococcus species DETECTED (A) NOT DETECTED   Staphylococcus aureus (BCID) NOT DETECTED NOT DETECTED   Staphylococcus epidermidis DETECTED (A) NOT DETECTED   Staphylococcus lugdunensis NOT DETECTED NOT DETECTED   Streptococcus species NOT DETECTED NOT DETECTED   Streptococcus agalactiae NOT DETECTED NOT DETECTED   Streptococcus pneumoniae NOT DETECTED NOT DETECTED   Streptococcus pyogenes NOT DETECTED NOT DETECTED   A.calcoaceticus-baumannii NOT DETECTED NOT DETECTED   Bacteroides fragilis NOT DETECTED NOT DETECTED   Enterobacterales NOT DETECTED NOT DETECTED   Enterobacter cloacae complex NOT DETECTED NOT DETECTED   Escherichia coli NOT DETECTED NOT DETECTED   Klebsiella aerogenes NOT DETECTED NOT DETECTED   Klebsiella oxytoca NOT DETECTED NOT DETECTED   Klebsiella pneumoniae NOT DETECTED NOT DETECTED   Proteus species NOT DETECTED NOT DETECTED   Salmonella species NOT DETECTED NOT DETECTED   Serratia marcescens NOT DETECTED NOT DETECTED   Haemophilus influenzae NOT DETECTED NOT DETECTED   Neisseria meningitidis NOT DETECTED NOT DETECTED   Pseudomonas aeruginosa NOT DETECTED NOT DETECTED   Stenotrophomonas maltophilia NOT DETECTED NOT DETECTED    Candida albicans NOT DETECTED NOT DETECTED   Candida auris NOT DETECTED NOT DETECTED   Candida glabrata NOT DETECTED NOT DETECTED   Candida krusei NOT DETECTED NOT DETECTED   Candida parapsilosis NOT DETECTED NOT DETECTED   Candida tropicalis NOT DETECTED NOT DETECTED   Cryptococcus neoformans/gattii NOT DETECTED NOT DETECTED   Methicillin resistance mecA/C DETECTED (A) NOT DETECTED    Kaylee King 01/09/2021  3:53 AM

## 2021-01-09 NOTE — Progress Notes (Signed)
   Referral received for Kaylee King : goals of care discussion. Medical records reviewed. There is no family present at the bedside.  I was able to speak with patient's daughter Rinaldo Cloud over the phone. She is unable to meet today and requests 24 hour notice to make arrangements. GOC meeting scheduled for 3/922 @ 3:00pm with my colleague Lowella Bandy. Family is aware we will meet at patient's bedside.   Detailed note and recommendations to follow once GOC has been completed.   Thank you for your referral and allowing [facility] to assist in Mrs. Roya Gieselman care.   Richardson Dopp, PA-C Palliative Medicine Team  Phone: [phone]  NO CHARGE

## 2021-01-09 NOTE — Progress Notes (Signed)
  Speech Language Pathology Treatment: Dysphagia  Patient Details Name: Kaylee King MRN: [id number] DOB: [birth year] Today's Date: 01/09/2021 Time: 0177-9390 SLP Time Calculation (min) (ACUTE ONLY): 8 min  Assessment / Plan / Recommendation Clinical Impression  SLP provided Max encouragement for PO intake; however, PO trials were very limited due to pt refusal. No overt s/s were noted with several sips of water and a bite of applesauce. SLP provided a binary choice between food options but pt still refused further trials. Discussed with her the importance of trying POs with SLP but she shows decreased reasoning. Recommend continuation of her current diet and SLP will continue to follow.   HPI HPI: Kaylee King is a 80 y.o. 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  CXR was remarkable for "Probable atelectasis at the bases." G-tube was placed on 12/18/20.  Most recent BSE on 12/01/20 with recommendations for NPO, progressive to D2/thin during that admission.      SLP Plan  Continue with current plan of care       Recommendations  Diet recommendations: Dysphagia 1 (puree);Thin liquid Liquids provided via: Cup;Straw Medication Administration: Crushed with puree Supervision: Staff to assist with self feeding;Full supervision/cueing for compensatory strategies Compensations: Slow rate;Small sips/bites;Minimize environmental distractions Postural Changes and/or Swallow Maneuvers: Seated upright 90 degrees                Oral Care Recommendations: Oral care BID Follow up Recommendations: Skilled Nursing facility SLP Visit Diagnosis: Dysphagia, unspecified (R13.10) Plan: Continue with current plan of care       GO                Zettie Cooley., SLP Student 01/09/2021, 4:01 PM

## 2021-01-09 NOTE — Progress Notes (Addendum)
Spoke to Philip Aspen, daughter about the EGD that is scheduled for tomorrow. Patient's daughter gives verbal consent to allow the procedure, Angelina Ok is the second Microbiologist. Rinaldo Cloud had concerns on the timing of the procedure because they have a palliative meeting at 1500 tomorrow as well. Reached out [place], PA about the concern, will call back for definitive time. RN will reach out to palliative about the procedure.

## 2021-01-09 NOTE — Progress Notes (Signed)
PROGRESS NOTE    Kaylee King  QVZ:[id number] DOB: February 12, 1941 DOA: 01/06/2021 PCP: Fleet Contras, MD   Brief Narrative:  HPI On 01/06/2021 by Dr. Madelyn Flavors Kaylee King is a 80 y.o. 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  Patient unable to provide any significant history at this time and only tells me to stop trying to examine the patient.  Daughter notes that recently when she has visited her at [facility] patient is curled up on her side it seems due to pain.  Whenever her pain has been adequately treated she will sit up on the side of bed and is able to answer questions.  Recently the patient has had what appeared to be dried blood on her lips, but daughter had not been told of any episodes of vomiting.   Patient was found by EMS to be tachycardic and diaphoretic with O2 saturations around 85% on room air.  She was placed on nonrebreather at 10 L with improvement of O2 saturations to 93%.   Recently hospitalized from 1/20-2/15 after presenting with hemorrhagic shock due to a variceal bleed requiring 6 units PRBCs and 2 units of FFP.  EGD by GI on 1/20 showed esophageal varices which were banded and several nonbleeding gastric ulcers.  Patient was intubated for airway protection, and diagnosed with pneumonia treated with 7 days of Rocephin IV.  Patient was able to be weaned off the ventilator to 2 L of nasal cannula oxygen, but required PEG tube placement for nutrition.  Urine cultures from that hospitalization grew out E. coli and Morganella.  E. coli was sensitive to Rocephin, but sensitivities were not reported for Rocephin with Morganella.  She was discharged to [facility].  Interim history Admitted for severe sepsis suspected to be secondary to UTI, on broad spectrum antibiotics.  Patient also noted to have possible GI bleed,  gastroenterology consulted and appreciated.  Infectious disease also consulted for questionable bacteremia.  Palliative care consulted for goals of care discussion. Assessment & Plan   Severe sepsis secondary to UTI/ ? Intramuscular hematoma or abscess vs ?bacteremia -Patient presented with tachypnea, leukocytosis and elevated lactic acid level along with acute metabolic encephalopathy -Chest x-ray unremarkable for infection. -UA positive for moderate leukocytes, rare bacteria, 21-50 WBCs -Previous hospitalization, patient had >100K of E. coli and Morganella and had received Rocephin for suspected treatment of community-acquired pneumonia -Patient initially placed on vancomycin and Zosyn -Blood culture showed GPC in chains and pairs, GNR-reflex ID detected Enterococcus faecalis, Enterococcus faecium, Streptococcus species, Streptococcus agalactiae, Enterobacterales, Proteus species, VRE -Repeat Blood culture 3/62022 show 1/2 cultures with staph epi, methicillin resistance, question if this is contamination -transitioned  on Daptomycin and cefepime -DDimer 6.45 -Obtained CTA chest/abd/pelvis: No PE. Trace pleural effusions. 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 Mild bladder wall hyperemia. Right psoas hematoma or abscess.  -Infectious disease consulted and appreciated, agreed with continuing antibiotics for now  Acute metabolic encephalopathy -Patient was noted to be altered at her facility -May be secondary to the above with urinary tract infection versus cirrhosis -Continue to monitor and treat underlying conditions -Currently in soft restraints -On 3/7, discussed with patient's daughter via phone, patient has had some cognitive impairment but not much prior to her hospitalization in January 2022, however she has not returned to her normal baseline since that hospitalization. -Continues to be altered, and not at baseline  Acute hypoxic respiratory  failure -Initially oxygen saturations were 85% on room air and she required a nonrebreather -Continues to be on 3 L of nasal cannula, will attempt to wean if possible -As above chest x-ray unremarkable for infiltrate or infection however did show low lung volumes -Continue incentive spirometry if able  Chronic normocytic anemia/possible GI bleed -Patient with history of esophageal and gastric varices -Hemoglobin of 7.7 on admission, up to 8 today -FOBT was positive  -Gastroenterology consulted and appreciated -Patient's tube feeds have been resumed along with dysphagia 1 diet with assistance -Continue PPI  Hypotension -Patient noted to have some soft blood pressures, however blood pressure has now improved and is stabilized -Will DC IV fluids -Amlodipine held  Cirrhosis with portal hypertension and history of varices -As above, gastroenterology consulted and appreciated, currently no plans for repeat EGD at this time -She recently underwent EGD on 120 and had banding of 6 esophageal varices with noted nonbleeding gastric ulcers -Pending abdominal ultrasound -Continue rifaximin and lactulose  Hypothyroidism -TSH 15.113, appears improved from TSH in February 2022 -Continue Synthroid, dose increased 125 mcg daily -Repeat TSH in the outpatient setting in 4 to 6 weeks  Dysphagia -Patient has PEG placement -Speech therapy reevaluated patient and recommended dysphagia 1 diet  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 Daughter via phone on 3/7  Disposition Plan:  Status is: Inpatient  Remains inpatient appropriate because:Altered mental status, IV treatments appropriate due to intensity of illness or inability to take PO and Inpatient level of care appropriate due to severity of illness   Dispo: The patient is from: SNF              Anticipated d/c is to: SNF              Patient currently is not medically stable to d/c.   Difficult to place patient No   Consultants Gastroenterology Infectious disease  Procedures  None  Antibiotics   Anti-infectives (From admission, onward)   Start     Dose/Rate Route Frequency Ordered Stop   01/08/21 0500  vancomycin (VANCOREADY) IVPB 1250 mg/250 mL  Status:  Discontinued        1,250 mg 166.7 mL/hr over 90 Minutes Intravenous Every 48 hours 01/06/21 1051 01/06/21 2053   01/07/21 2200  ceFEPIme (MAXIPIME) 2 g in sodium chloride 0.9 % 100 mL IVPB  Status:  Discontinued        2 g 200 mL/hr over 30 Minutes Intravenous Every 12 hours 01/07/21 1105 01/09/21 0850   01/06/21 2300  cefTRIAXone (ROCEPHIN) 2 g in sodium chloride 0.9 % 100 mL IVPB  Status:  Discontinued        2 g 200 mL/hr over 30 Minutes Intravenous Every 24 hours 01/06/21 2053 01/07/21 1059   01/06/21 2145  DAPTOmycin (CUBICIN) 560 mg in sodium chloride 0.9 % IVPB  Status:  Discontinued        560 mg 222.4 mL/hr over 30 Minutes Intravenous Daily 01/06/21 2053 01/09/21 0850   01/06/21 1400  piperacillin-tazobactam (ZOSYN)  IVPB 3.375 g  Status:  Discontinued       "Followed by" Linked Group Details   3.375 g 12.5 mL/hr over 240 Minutes Intravenous Every 8 hours 01/06/21 0836 01/06/21 2053   01/06/21 1030  rifaximin (XIFAXAN) tablet 550 mg        550 mg Per Tube 2 times daily 01/06/21 0910     01/06/21 0845  piperacillin-tazobactam (ZOSYN) IVPB 3.375 g       "Followed by" Linked Group Details   3.375 g 100 mL/hr over 30 Minutes Intravenous  Once 01/06/21 0836  01/06/21 1010   01/06/21 0830  piperacillin-tazobactam (ZOSYN) IVPB 3.375 g  Status:  Discontinued        3.375 g 100 mL/hr over 30 Minutes Intravenous Every 8 hours 01/06/21 0826 01/06/21 0836   01/06/21 0345  ceFEPIme (MAXIPIME) 2 g in sodium chloride 0.9 % 100 mL IVPB        2 g 200 mL/hr over 30 Minutes Intravenous  Once 01/06/21 0332 01/06/21 0435   01/06/21 0345  metroNIDAZOLE (FLAGYL) IVPB 500 mg        500 mg 100 mL/hr over 60 Minutes Intravenous  Once 01/06/21 0332 01/06/21 0753   01/06/21 0345  vancomycin (VANCOREADY) IVPB 1000 mg/200 mL  Status:  Discontinued        1,000 mg 200 mL/hr over 60 Minutes Intravenous  Once 01/06/21 0332 01/06/21 0341   01/06/21 0345  vancomycin (VANCOREADY) IVPB 1500 mg/300 mL        1,500 mg 150 mL/hr over 120 Minutes Intravenous  Once 01/06/21 0341 01/06/21 0643      Subjective:   Tama Grosz seen and examined today.  Continues to be altered.  Able to tell me her name and say bye.  Not able to answer other questions at this time.  Objective:   Vitals:   01/09/21 0338 01/09/21 0400 01/09/21 0500 01/09/21 0740  BP: 130/61 (!) 123/58    Pulse:      Resp: Temp: (!) 97.4 F (36.3 C)   98.5 F (36.9 C)  TempSrc: Oral   Oral  SpO2: 96% 98% 98%   Weight: 69.9 kg     Height:        Intake/Output Summary (Last 24 hours) at 01/09/2021 0943 Last data filed at 01/09/2021 0857 Gross per 24 hour  Intake 898.35 ml  Output 2450 ml  Net -1551.65 ml   Filed Weights   01/08/21 0127 01/09/21 0007 01/09/21 0338  Weight: 71.7 kg 70.1 kg 69.9 kg   Exam  General: Well developed, acutely/chronically ill-appearing, NAD  HEENT: NCAT, mucous membranes moist.   Cardiovascular: S1 S2 auscultated, RRR  Respiratory: Diminished breath sounds anteriorly  Abdomen: Soft, nontender, nondistended, + bowel sounds, PEG tube in place  Extremities: warm dry without cyanosis clubbing or edema  Neuro: AAOx 1, (self,), not answering any of my  questions or following commands at this time.  Psych: cannot assess at this time  Data Reviewed: I have personally reviewed following labs and imaging studies  CBC: Recent Labs  Lab 01/06/21 0332 01/06/21 1211 01/06/21 1638 01/06/21 2229 01/07/21 0300 01/08/21 0432 01/09/21 0340  WBC 16.7*  --   --   --  8.2 6.9 6.0  NEUTROABS 15.7*  --   --   --   --   --   --   HGB 7.7*   < > 7.1* 7.7* 8.4* 8.1* 8.0*  HCT 26.5*   < >  23.2* 24.4* 27.8* 27.4* 26.9*  MCV 97.1  --   --   --  93.9 95.5 94.7  PLT 498*  --   --   --  409* 422* 395   < > = values in this interval not displayed.   Basic Metabolic Panel: Recent Labs  Lab 01/06/21 0332 01/07/21 0300 01/08/21 0432 01/09/21 0340  NA 133* 137 134* 135  K 5.2* 5.1 4.2 4.9  CL 101 106 106 105  CO2 22 21* 18* 22  GLUCOSE 191* 80 126* 127*  BUN 26* 21 18 15   CREATININE 0.97 0.95 0.87 0.72  CALCIUM 8.4* 8.5* 8.2* 8.1*   GFR: Estimated Creatinine Clearance: 52 mL/min (by C-G formula based on SCr of 0.72 mg/dL). Liver Function Tests: Recent Labs  Lab 01/06/21 0332 01/07/21 0300 01/08/21 0432  AST 63* 52* 50*  ALT 57* 42 44  ALKPHOS 289* 211* 206*  BILITOT 1.1 1.0 0.7  PROT 7.2 6.5 6.7  ALBUMIN 2.3* 2.1* 2.1*   No results for input(s): LIPASE, AMYLASE in the last 168 hours. Recent Labs  Lab 01/06/21 0436 01/08/21 0432  AMMONIA 29 31   Coagulation Profile: Recent Labs  Lab 01/06/21 0332 01/06/21 0503  INR 1.1 1.1   Cardiac Enzymes: Recent Labs  Lab 01/07/21 0300  CKTOTAL 34*   BNP (last 3 results) No results for input(s): PROBNP in the last 8760 hours. HbA1C: No results for input(s): HGBA1C in the last 72 hours. CBG: Recent Labs  Lab 01/08/21 1144 01/08/21 1612 01/08/21 2047 01/09/21 0013 01/09/21 0742  GLUCAP 130* 124* 114* 129* 119*   Lipid Profile: No results for input(s): CHOL, HDL, LDLCALC, TRIG, CHOLHDL, LDLDIRECT in the last 72 hours. Thyroid Function Tests: No results for input(s): TSH,  T4TOTAL, FREET4, T3FREE, THYROIDAB in the last 72 hours. Anemia Panel: No results for input(s): VITAMINB12, FOLATE, FERRITIN, TIBC, IRON, RETICCTPCT in the last 72 hours. Urine analysis:    Component Value Date/Time   COLORURINE YELLOW 01/06/2021 0621   APPEARANCEUR HAZY (A) 01/06/2021 0621   LABSPEC 1.013 01/06/2021 0621   PHURINE 6.0 01/06/2021 0621   GLUCOSEU NEGATIVE 01/06/2021 0621   HGBUR NEGATIVE 01/06/2021 0621   BILIRUBINUR NEGATIVE 01/06/2021 0621   KETONESUR NEGATIVE 01/06/2021 0621   PROTEINUR 30 (A) 01/06/2021 0621   NITRITE NEGATIVE 01/06/2021 0621   LEUKOCYTESUR MODERATE (A) 01/06/2021 0621   Sepsis Labs: @LABRCNTIP (procalcitonin:4,lacticidven:4)  ) Recent Results (from the past 240 hour(s))  Urine culture     Status: Abnormal   Collection Time: 01/06/21  3:32 AM   Specimen: In/Out Cath Urine  Result Value Ref Range Status   Specimen Description IN/OUT CATH URINE  Final   Special Requests   Final    NONE Performed at [facility], [address], [place], [place] [address]    Culture 20,000 COLONIES/mL PSEUDOMONAS AERUGINOSA (A)  Final   Report Status 01/08/2021 FINAL  Final   Organism ID, Bacteria PSEUDOMONAS AERUGINOSA (A)  Final      Susceptibility   Pseudomonas aeruginosa - MIC*    CEFTAZIDIME >=64 RESISTANT Resistant     CIPROFLOXACIN <=0.25 SENSITIVE Sensitive     GENTAMICIN 2 SENSITIVE Sensitive     IMIPENEM 2 SENSITIVE Sensitive     * 20,000 COLONIES/mL PSEUDOMONAS AERUGINOSA  Blood Culture (routine x 2)     Status: Abnormal (Preliminary result)   Collection Time: 01/06/21  3:50 AM   Specimen: BLOOD  Result Value Ref Range Status   Specimen Description BLOOD SITE  NOT SPECIFIED  Final   Special Requests   Final    BOTTLES DRAWN AEROBIC AND ANAEROBIC Blood Culture results may not be optimal due to an inadequate volume of blood received in culture bottles   Culture  Setup Time   Final    GRAM POSITIVE COCCI IN CHAINS IN PAIRS GRAM NEGATIVE  RODS ANAEROBIC BOTTLE ONLY CRITICAL RESULT CALLED TO, READ BACK BY AND VERIFIED WITH: Malva Cogan PHARMD  01/06/21 EB    Culture (A)  Final    PROTEUS MIRABILIS ENTEROCOCCUS FAECALIS CULTURE REINCUBATED FOR BETTER GROWTH    Report Status PENDING  Incomplete   Organism ID, Bacteria PROTEUS MIRABILIS  Final      Susceptibility   Proteus mirabilis - MIC*    AMPICILLIN <=2 SENSITIVE Sensitive     CEFAZOLIN <=4 SENSITIVE Sensitive     CEFEPIME <=0.12 SENSITIVE Sensitive     CEFTAZIDIME <=1 SENSITIVE Sensitive     CEFTRIAXONE <=0.25 SENSITIVE Sensitive     CIPROFLOXACIN <=0.25 SENSITIVE Sensitive     GENTAMICIN <=1 SENSITIVE Sensitive     IMIPENEM 2 SENSITIVE Sensitive     TRIMETH/SULFA <=20 SENSITIVE Sensitive     AMPICILLIN/SULBACTAM <=2 SENSITIVE Sensitive     PIP/TAZO Value in next row Sensitive      <=4 SENSITIVEPerformed at [facility], [address], [place], [place] [address]    * PROTEUS MIRABILIS  Blood Culture ID Panel (Reflexed)     Status: Abnormal   Collection Time: 01/06/21  3:50 AM  Result Value Ref Range Status   Enterococcus faecalis DETECTED (A) NOT DETECTED Final    Comment: CRITICAL RESULT CALLED TO, READ BACK BY AND VERIFIED WITH: KIM HURTH PHARMD  01/06/21 EB    Enterococcus Faecium DETECTED (A) NOT DETECTED Final    Comment: CRITICAL RESULT CALLED TO, READ BACK BY AND VERIFIED WITH: KIM HURTH PHARMD  01/06/21 EB    Listeria monocytogenes NOT DETECTED NOT DETECTED Final   Staphylococcus species NOT DETECTED NOT DETECTED Final   Staphylococcus aureus (BCID) NOT DETECTED NOT DETECTED Final   Staphylococcus epidermidis NOT DETECTED NOT DETECTED Final   Staphylococcus lugdunensis NOT DETECTED NOT DETECTED Final   Streptococcus species DETECTED (A) NOT DETECTED Final    Comment: CRITICAL RESULT CALLED TO, READ BACK BY AND VERIFIED WITH: KIM HURTH PHARMD  01/06/21 EB    Streptococcus agalactiae DETECTED (A) NOT DETECTED Final    Comment:  CRITICAL RESULT CALLED TO, READ BACK BY AND VERIFIED WITH: Malva Cogan PHARMD  01/06/21 EB    Streptococcus pneumoniae NOT DETECTED NOT DETECTED Final   Streptococcus pyogenes NOT DETECTED NOT DETECTED Final   A.calcoaceticus-baumannii NOT DETECTED NOT DETECTED Final   Bacteroides fragilis NOT DETECTED NOT DETECTED Final   Enterobacterales DETECTED (A) NOT DETECTED Final    Comment: Enterobacterales represent a large order of gram negative bacteria, not a single organism. CRITICAL RESULT CALLED TO, READ BACK BY AND VERIFIED WITH: Malva Cogan PHARMD  01/06/21 EB    Enterobacter cloacae complex NOT DETECTED NOT DETECTED Final   Escherichia coli NOT DETECTED NOT DETECTED Final   Klebsiella aerogenes NOT DETECTED NOT DETECTED Final   Klebsiella oxytoca NOT DETECTED NOT DETECTED Final   Klebsiella pneumoniae NOT DETECTED NOT DETECTED Final   Proteus species DETECTED (A) NOT DETECTED Final    Comment: CRITICAL RESULT CALLED TO, READ BACK BY AND VERIFIED WITH: KIM HURTH PHARMD  01/06/21 EB    Salmonella species NOT DETECTED NOT DETECTED Final   Serratia marcescens  NOT DETECTED NOT DETECTED Final   Haemophilus influenzae NOT DETECTED NOT DETECTED Final   Neisseria meningitidis NOT DETECTED NOT DETECTED Final   Pseudomonas aeruginosa NOT DETECTED NOT DETECTED Final   Stenotrophomonas maltophilia NOT DETECTED NOT DETECTED Final   Candida albicans NOT DETECTED NOT DETECTED Final   Candida auris NOT DETECTED NOT DETECTED Final   Candida glabrata NOT DETECTED NOT DETECTED Final   Candida krusei NOT DETECTED NOT DETECTED Final   Candida parapsilosis NOT DETECTED NOT DETECTED Final   Candida tropicalis NOT DETECTED NOT DETECTED Final   Cryptococcus neoformans/gattii NOT DETECTED NOT DETECTED Final   CTX-M ESBL NOT DETECTED NOT DETECTED Final   Carbapenem resistance IMP NOT DETECTED NOT DETECTED Final   Carbapenem resistance KPC NOT DETECTED NOT DETECTED Final   Carbapenem resistance NDM  NOT DETECTED NOT DETECTED Final   Carbapenem resist OXA 48 LIKE NOT DETECTED NOT DETECTED Final   Vancomycin resistance DETECTED (A) NOT DETECTED Final    Comment: CRITICAL RESULT CALLED TO, READ BACK BY AND VERIFIED WITH: Malva Cogan PHARMD @1910  01/06/21 EB    Carbapenem resistance VIM NOT DETECTED NOT DETECTED Final    Comment: Performed at [facility], [address] [place], [place] [address]  Blood Culture (routine x 2)     Status: None (Preliminary result)   Collection Time: 01/06/21  3:52 AM   Specimen: BLOOD  Result Value Ref Range Status   Specimen Description BLOOD SITE NOT SPECIFIED  Final   Special Requests   Final    BOTTLES DRAWN AEROBIC AND ANAEROBIC Blood Culture adequate volume   Culture   Final    NO GROWTH 2 DAYS Performed at [facility], [address], [place], [place] [address]    Report Status PENDING  Incomplete  Resp Panel by RT-PCR (Flu A&B, Covid) Nasopharyngeal Swab     Status: None   Collection Time: 01/06/21  4:37 AM   Specimen: Nasopharyngeal Swab; Nasopharyngeal(NP) swabs in vial transport medium  Result Value Ref Range Status   SARS Coronavirus 2 by RT PCR NEGATIVE NEGATIVE Final    Comment: (NOTE) SARS-CoV-2 target nucleic acids are NOT DETECTED.  The SARS-CoV-2 RNA is generally detectable in upper respiratory specimens during the acute phase of infection. The lowest concentration of SARS-CoV-2 viral copies this assay can detect is 138 copies/mL. A negative result does not preclude SARS-Cov-2 infection and should not be used as the sole basis for treatment or other patient management decisions. A negative result may occur with  improper specimen collection/handling, submission of specimen other than nasopharyngeal swab, presence of viral mutation(s) within the areas targeted by this assay, and inadequate number of viral copies(<138 copies/mL). A negative result must be combined with clinical observations, patient history, and  epidemiological information. The expected result is Negative.  Fact Sheet for Patients:  BloggerCourse.com  Fact Sheet for Healthcare Providers:  SeriousBroker.it  This test is no t yet approved or cleared by the [place] FDA and  has been authorized for detection and/or diagnosis of SARS-CoV-2 by FDA under an Emergency Use Authorization (EUA). This EUA will remain  in effect (meaning this test can be used) for the duration of the COVID-19 declaration under Section 564(b)(1) of the Act, 21 U.S.C.section 360bbb-3(b)(1), unless the authorization is terminated  or revoked sooner.       Influenza A by PCR NEGATIVE NEGATIVE Final   Influenza B by PCR NEGATIVE NEGATIVE Final    Comment: (NOTE) The Xpert Xpress SARS-CoV-2/FLU/RSV  plus assay is intended as an aid in the diagnosis of influenza from Nasopharyngeal swab specimens and should not be used as a sole basis for treatment. Nasal washings and aspirates are unacceptable for Xpert Xpress SARS-CoV-2/FLU/RSV testing.  Fact Sheet for Patients: BloggerCourse.comhttps://www.fda.gov/media/152166/download  Fact Sheet for Healthcare Providers: SeriousBroker.ithttps://www.fda.gov/media/152162/download  This test is not yet approved or cleared by the [place] FDA and has been authorized for detection and/or diagnosis of SARS-CoV-2 by FDA under an Emergency Use Authorization (EUA). This EUA will remain in effect (meaning this test can be used) for the duration of the COVID-19 declaration under Section 564(b)(1) of the Act, 21 U.S.C. section 360bbb-3(b)(1), unless the authorization is terminated or revoked.  Performed at [facility], [address] [place], [place] [address]   Culture, blood (routine x 2)     Status: None (Preliminary result)   Collection Time: 01/07/21 12:14 PM   Specimen: BLOOD  Result Value Ref Range Status   Specimen Description BLOOD SITE NOT SPECIFIED  Final   Special Requests    Final    BOTTLES DRAWN AEROBIC ONLY Blood Culture results may not be optimal due to an inadequate volume of blood received in culture bottles   Culture  Setup Time   Final    AEROBIC BOTTLE ONLY GRAM POSITIVE COCCI CRITICAL RESULT CALLED TO, READ BACK BY AND VERIFIED WITH: L SEAY PHARMD 01/09/21 0344 JDW    Culture   Final    NO GROWTH < 24 HOURS Performed at [facility], [address] [place], [place] [address]    Report Status PENDING  Incomplete  Culture, blood (routine x 2)     Status: None (Preliminary result)   Collection Time: 01/07/21 12:14 PM   Specimen: BLOOD  Result Value Ref Range Status   Specimen Description BLOOD SITE NOT SPECIFIED  Final   Special Requests   Final    BOTTLES DRAWN AEROBIC ONLY Blood Culture results may not be optimal due to an inadequate volume of blood received in culture bottles   Culture   Final    NO GROWTH < 24 HOURS Performed at [facility], [address] [place], [place] [address]    Report Status PENDING  Incomplete  Blood Culture ID Panel (Reflexed)     Status: Abnormal   Collection Time: 01/07/21 12:14 PM  Result Value Ref Range Status   Enterococcus faecalis NOT DETECTED NOT DETECTED Final   Enterococcus Faecium NOT DETECTED NOT DETECTED Final   Listeria monocytogenes NOT DETECTED NOT DETECTED Final   Staphylococcus species DETECTED (A) NOT DETECTED Final    Comment: CRITICAL RESULT CALLED TO, READ BACK BY AND VERIFIED WITH: L SEAY PHARMD 01/09/21 0344 JDW    Staphylococcus aureus (BCID) NOT DETECTED NOT DETECTED Final   Staphylococcus epidermidis DETECTED (A) NOT DETECTED Final    Comment: Methicillin (oxacillin) resistant coagulase negative staphylococcus. Possible blood culture contaminant (unless isolated from more than one blood culture draw or clinical case suggests pathogenicity). No antibiotic treatment is indicated for blood  culture contaminants. CRITICAL RESULT CALLED TO, READ BACK BY AND VERIFIED WITH: L  SEAY PHARMD 01/09/21 0344 JDW    Staphylococcus lugdunensis NOT DETECTED NOT DETECTED Final   Streptococcus species NOT DETECTED NOT DETECTED Final   Streptococcus agalactiae NOT DETECTED NOT DETECTED Final   Streptococcus pneumoniae NOT DETECTED NOT DETECTED Final   Streptococcus pyogenes NOT DETECTED NOT DETECTED Final   A.calcoaceticus-baumannii NOT DETECTED NOT DETECTED Final   Bacteroides fragilis  NOT DETECTED NOT DETECTED Final   Enterobacterales NOT DETECTED NOT DETECTED Final   Enterobacter cloacae complex NOT DETECTED NOT DETECTED Final   Escherichia coli NOT DETECTED NOT DETECTED Final   Klebsiella aerogenes NOT DETECTED NOT DETECTED Final   Klebsiella oxytoca NOT DETECTED NOT DETECTED Final   Klebsiella pneumoniae NOT DETECTED NOT DETECTED Final   Proteus species NOT DETECTED NOT DETECTED Final   Salmonella species NOT DETECTED NOT DETECTED Final   Serratia marcescens NOT DETECTED NOT DETECTED Final   Haemophilus influenzae NOT DETECTED NOT DETECTED Final   Neisseria meningitidis NOT DETECTED NOT DETECTED Final   Pseudomonas aeruginosa NOT DETECTED NOT DETECTED Final   Stenotrophomonas maltophilia NOT DETECTED NOT DETECTED Final   Candida albicans NOT DETECTED NOT DETECTED Final   Candida auris NOT DETECTED NOT DETECTED Final   Candida glabrata NOT DETECTED NOT DETECTED Final   Candida krusei NOT DETECTED NOT DETECTED Final   Candida parapsilosis NOT DETECTED NOT DETECTED Final   Candida tropicalis NOT DETECTED NOT DETECTED Final   Cryptococcus neoformans/gattii NOT DETECTED NOT DETECTED Final   Methicillin resistance mecA/C DETECTED (A) NOT DETECTED Final    Comment: CRITICAL RESULT CALLED TO, READ BACK BY AND VERIFIED WITH: L SEAY PHARMD 01/09/21 0344 JDW Performed at [facility], [address], [place], [place] [address]       Radiology Studies: CT ANGIO CHEST PE W OR WO CONTRAST  Result Date: 01/07/2021 CLINICAL DATA:  Hypoxemia and bacteremia EXAM: CT  ANGIOGRAPHY CHEST CT ABDOMEN AND PELVIS WITH CONTRAST TECHNIQUE: Multidetector CT imaging of the chest was performed using the standard protocol during bolus administration of intravenous contrast. Multiplanar CT image reconstructions and MIPs were obtained to evaluate the vascular anatomy. Multidetector CT imaging of the abdomen and pelvis was performed using the standard protocol during bolus administration of intravenous contrast. CONTRAST:  34mL OMNIPAQUE IOHEXOL 350 MG/ML SOLN COMPARISON:  Ultrasound 01/07/2021 radiograph 01/06/2021 CT 1222 FINDINGS: CTA CHEST FINDINGS Cardiovascular: Satisfactory opacification of pulmonary arteries. No visible pulmonary arterial filling defects though respiratory motion may limit detection of smaller segmental and subsegmental filling defects. Central pulmonary arteries are enlarged though similar to prior caliber. Cardiac size is within normal limits. No pericardial effusion. Three-vessel coronary artery atherosclerosis. The aortic root is suboptimally assessed given cardiac pulsation artifact. Atherosclerotic plaque throughout the thoracic aorta. Dilatation of the distal thoracic aortic arch to 3.6 cm, returning to a more normal caliber by the level of the diaphragmatic hiatus of 2.9 cm. Mild aortic tortuosity, can be senescent. No acute luminal abnormality of the imaged aorta within the limitations of the suboptimally opacified exam. No periaortic stranding or hemorrhage. Normal 3 vessel branching of the aortic arch with calcifications in the proximal great vessels. No major venous abnormalities. Mediastinum/Nodes: No mediastinal fluid or gas. Normal thyroid gland and thoracic inlet. No acute abnormality of the trachea or esophagus. No worrisome mediastinal, hilar or axillary adenopathy. Lungs/Pleura: Low volumes and atelectasis on water likely mild emphysematous changes. Some additional vascular redistribution, septal thickening is noted as well, can reflect mild  interstitial edema. Trace pleural effusions, right greater than left, with dependent and passive atelectatic changes in the lung bases. No concerning pulmonary nodules or masses. Additional bandlike areas of opacity likely reflect scarring and or atelectasis. Musculoskeletal: Multilevel degenerative changes are present in the imaged portions of the spine. Likely remote anterior wedging deformities T10, T12, unchanged from comparison. Superimposed Schmorl's node formations and vacuum disc phenomenon. No acute fracture or or conspicuous osseous abnormality  is seen. Mild body wall edema. No worrisome chest wall mass or lesion. Benign macro calcifications seen in the breast tissues, suspect many of which are mass killer. Review of the MIP images confirms the above findings. CT ABDOMEN and PELVIS FINDINGS Hepatobiliary: No worrisome focal liver lesions. Smooth liver surface contour. Normal hepatic attenuation. Gallbladder decompressed. No pericholecystic fluid or inflammation. No visible calcified gallstone or biliary ductal dilatation. Pancreas: No pancreatic ductal dilatation or surrounding inflammatory changes. Spleen: Normal in size. No concerning splenic lesions. Adrenals/Urinary Tract: Normal adrenals. Kidneys enhance and excrete symmetrically. Bilateral extrarenal pelves. Question some mild bladder wall hyperemia and urothelial thickening which could reflect ascending infection in the appropriate clinical context. Additionally, there is a punctate radiodensity layering in the left posterolateral bladder, separate from the ureterovesicular junction, suggestive of a layering bladder calculus. Stomach/Bowel: Extensive vascular collateralization in varices formation about the distal esophagus and proximal stomach. Percutaneous gastrostomy tube in place with adequate pexy of the anterior wall of the gastric body to the anterior abdominal wall. No acute complication. Normally seated balloon. Duodenum is unremarkable  accounting for distension. No small bowel thickening or dilatation is seen. High attenuation enteric contrast media traverses through the colon to the level of the rectal vault, lack of formed stool, can be seen with rapid transit state. Scattered colonic diverticula without focal inflammation to suggest diverticulitis. 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 Atherosclerotic calcifications within the abdominal aorta and branch vessels. No other aneurysm or ectasia. No enlarged abdominopelvic lymph nodes. Reproductive: Anteverted uterus. No concerning adnexal lesions. There is slight asymmetric prominence of the left gonadal vein and parametrial vasculature, nonspecific though can be seen in the setting of pelvic congestion. Other: Stranding and inflammatory changes appears centered upon the right psoas musculature with some additional hazy stranding distributed in the retroperitoneum. Possibly reactive change. No free air. No bowel containing hernia. Musculoskeletal: Interval contraction of the previously expanded right psoas musculature albeit with a persistent hypoattenuating focus centrally within the muscle belly (6/45, 3/53), can reflect a residual intramuscular hematoma or abscess in the setting of sepsis. No other intramuscular collection is seen. Multilevel degenerative changes are noted in the lumbar levels. Levocurvature apex L2-3. Associated pelvic tilt. Degenerative changes in the hips and pelvis as well. No acute or conspicuous osseous lesions. Review of the MIP images confirms the above findings. IMPRESSION: 1. No evidence of acute pulmonary embolism though respiratory motion may limit detection of smaller segmental and subsegmental filling defects. 2. Enlarged central pulmonary arteries, suggestive of pulmonary arterial hypertension. 3. Trace pleural effusions, right greater than left, with dependent and passive atelectatic changes in the  lung bases. 4. Question some mild bladder wall hyperemia and urothelial thickening which could reflect ascending infection in the appropriate clinical context. Correlate with urinalysis. 5. Interval contraction of the previously expanded right psoas musculature albeit with a persistent hypoattenuating focus centrally within the muscle belly, can reflect a residual intramuscular hematoma or abscess in the setting of sepsis. Stranding and inflammatory changes appears centered upon the right psoas musculature with some additional hazy stranding distributed in the retroperitoneum, which could reflect reactive change. 6. High attenuation enteric contrast media traverses through the colon to the level of the rectal vault, lack of formed stool, can be seen with rapid transit state. 7. Dilatation of the aortic arch to 3.6 cm. Recommend annual imaging followup by CTA or MRA. This recommendation follows 2010 ACCF/AHA/AATS/ACR/ASA/SCA/SCAI/SIR/STS/SVM Guidelines for the Diagnosis and Management of Patients with Thoracic  Aortic Disease. Circulation.2010; 121: Z610-R604. Aortic aneurysm NOS (ICD10-I71.9) 8. 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 Recommend follow-up ultrasound every 3 years. This recommendation follows ACR consensus guidelines: White Paper of the ACR Incidental Findings Committee II on Vascular Findings. J Am Coll Radiol 2013; 10:789-794. 9. Aortic Atherosclerosis (ICD10-I70.0). 10. Coronary artery calcifications are present. Please note that the presence of coronary artery calcium documents the presence of coronary artery disease, the severity of this disease and any potential stenosis cannot be assessed on this non-gated CT examination. Electronically Signed   By: Kreg Shropshire M.D.   On: 01/07/2021 19:57   CT ABDOMEN PELVIS W CONTRAST  Result Date: 01/07/2021 CLINICAL DATA:  Hypoxemia and bacteremia EXAM: CT ANGIOGRAPHY CHEST CT ABDOMEN AND PELVIS WITH CONTRAST TECHNIQUE:  Multidetector CT imaging of the chest was performed using the standard protocol during bolus administration of intravenous contrast. Multiplanar CT image reconstructions and MIPs were obtained to evaluate the vascular anatomy. Multidetector CT imaging of the abdomen and pelvis was performed using the standard protocol during bolus administration of intravenous contrast. CONTRAST:  99mL OMNIPAQUE IOHEXOL 350 MG/ML SOLN COMPARISON:  Ultrasound 01/07/2021 radiograph 01/06/2021 CT 1222 FINDINGS: CTA CHEST FINDINGS Cardiovascular: Satisfactory opacification of pulmonary arteries. No visible pulmonary arterial filling defects though respiratory motion may limit detection of smaller segmental and subsegmental filling defects. Central pulmonary arteries are enlarged though similar to prior caliber. Cardiac size is within normal limits. No pericardial effusion. Three-vessel coronary artery atherosclerosis. The aortic root is suboptimally assessed given cardiac pulsation artifact. Atherosclerotic plaque throughout the thoracic aorta. Dilatation of the distal thoracic aortic arch to 3.6 cm, returning to a more normal caliber by the level of the diaphragmatic hiatus of 2.9 cm. Mild aortic tortuosity, can be senescent. No acute luminal abnormality of the imaged aorta within the limitations of the suboptimally opacified exam. No periaortic stranding or hemorrhage. Normal 3 vessel branching of the aortic arch with calcifications in the proximal great vessels. No major venous abnormalities. Mediastinum/Nodes: No mediastinal fluid or gas. Normal thyroid gland and thoracic inlet. No acute abnormality of the trachea or esophagus. No worrisome mediastinal, hilar or axillary adenopathy. Lungs/Pleura: Low volumes and atelectasis on water likely mild emphysematous changes. Some additional vascular redistribution, septal thickening is noted as well, can reflect mild interstitial edema. Trace pleural effusions, right greater than left, with  dependent and passive atelectatic changes in the lung bases. No concerning pulmonary nodules or masses. Additional bandlike areas of opacity likely reflect scarring and or atelectasis. Musculoskeletal: Multilevel degenerative changes are present in the imaged portions of the spine. Likely remote anterior wedging deformities T10, T12, unchanged from comparison. Superimposed Schmorl's node formations and vacuum disc phenomenon. No acute fracture or or conspicuous osseous abnormality is seen. Mild body wall edema. No worrisome chest wall mass or lesion. Benign macro calcifications seen in the breast tissues, suspect many of which are mass killer. Review of the MIP images confirms the above findings. CT ABDOMEN and PELVIS FINDINGS Hepatobiliary: No worrisome focal liver lesions. Smooth liver surface contour. Normal hepatic attenuation. Gallbladder decompressed. No pericholecystic fluid or inflammation. No visible calcified gallstone or biliary ductal dilatation. Pancreas: No pancreatic ductal dilatation or surrounding inflammatory changes. Spleen: Normal in size. No concerning splenic lesions. Adrenals/Urinary Tract: Normal adrenals. Kidneys enhance and excrete symmetrically. Bilateral extrarenal pelves. Question some mild bladder wall hyperemia and urothelial thickening which could reflect ascending infection in the appropriate clinical context. Additionally, there is a punctate radiodensity layering in the left posterolateral bladder,  separate from the ureterovesicular junction, suggestive of a layering bladder calculus. Stomach/Bowel: Extensive vascular collateralization in varices formation about the distal esophagus and proximal stomach. Percutaneous gastrostomy tube in place with adequate pexy of the anterior wall of the gastric body to the anterior abdominal wall. No acute complication. Normally seated balloon. Duodenum is unremarkable accounting for distension. No small bowel thickening or dilatation is seen.  High attenuation enteric contrast media traverses through the colon to the level of the rectal vault, lack of formed stool, can be seen with rapid transit state. Scattered colonic diverticula without focal inflammation to suggest diverticulitis. 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 Atherosclerotic calcifications within the abdominal aorta and branch vessels. No other aneurysm or ectasia. No enlarged abdominopelvic lymph nodes. Reproductive: Anteverted uterus. No concerning adnexal lesions. There is slight asymmetric prominence of the left gonadal vein and parametrial vasculature, nonspecific though can be seen in the setting of pelvic congestion. Other: Stranding and inflammatory changes appears centered upon the right psoas musculature with some additional hazy stranding distributed in the retroperitoneum. Possibly reactive change. No free air. No bowel containing hernia. Musculoskeletal: Interval contraction of the previously expanded right psoas musculature albeit with a persistent hypoattenuating focus centrally within the muscle belly (6/45, 3/53), can reflect a residual intramuscular hematoma or abscess in the setting of sepsis. No other intramuscular collection is seen. Multilevel degenerative changes are noted in the lumbar levels. Levocurvature apex L2-3. Associated pelvic tilt. Degenerative changes in the hips and pelvis as well. No acute or conspicuous osseous lesions. Review of the MIP images confirms the above findings. IMPRESSION: 1. No evidence of acute pulmonary embolism though respiratory motion may limit detection of smaller segmental and subsegmental filling defects. 2. Enlarged central pulmonary arteries, suggestive of pulmonary arterial hypertension. 3. Trace pleural effusions, right greater than left, with dependent and passive atelectatic changes in the lung bases. 4. Question some mild bladder wall hyperemia and urothelial  thickening which could reflect ascending infection in the appropriate clinical context. Correlate with urinalysis. 5. Interval contraction of the previously expanded right psoas musculature albeit with a persistent hypoattenuating focus centrally within the muscle belly, can reflect a residual intramuscular hematoma or abscess in the setting of sepsis. Stranding and inflammatory changes appears centered upon the right psoas musculature with some additional hazy stranding distributed in the retroperitoneum, which could reflect reactive change. 6. High attenuation enteric contrast media traverses through the colon to the level of the rectal vault, lack of formed stool, can be seen with rapid transit state. 7. Dilatation of the aortic arch to 3.6 cm. Recommend annual imaging followup by CTA or MRA. This recommendation follows 2010 ACCF/AHA/AATS/ACR/ASA/SCA/SCAI/SIR/STS/SVM Guidelines for the Diagnosis and Management of Patients with Thoracic Aortic Disease. Circulation.2010; 121: Z610-R604. Aortic aneurysm NOS (ICD10-I71.9) 8. 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 Recommend follow-up ultrasound every 3 years. This recommendation follows ACR consensus guidelines: White Paper of the ACR Incidental Findings Committee II on Vascular Findings. J Am Coll Radiol 2013; 10:789-794. 9. Aortic Atherosclerosis (ICD10-I70.0). 10. Coronary artery calcifications are present. Please note that the presence of coronary artery calcium documents the presence of coronary artery disease, the severity of this disease and any potential stenosis cannot be assessed on this non-gated CT examination. Electronically Signed   By: Kreg Shropshire M.D.   On: 01/07/2021 19:57     Scheduled Meds: . enoxaparin (LOVENOX) injection  40 mg Subcutaneous Q24H  . feeding supplement (PROSource TF)  45  mL Per Tube Daily  . free water  200 mL Per Tube Q6H  . lactulose  20 g Per Tube BID  . levothyroxine  125 mcg Per Tube  QAC breakfast  . multivitamin with minerals  1 tablet Per Tube Daily  . nystatin ointment  1 application Topical BID  . pantoprazole sodium  40 mg Per Tube BID  . rifaximin  550 mg Per Tube BID  . thiamine  100 mg Per Tube Daily   Continuous Infusions: . sodium chloride Stopped (01/08/21 2057)  . dextrose 5 % and 0.45% NaCl 50 mL/hr at 01/09/21 0823  . feeding supplement (OSMOLITE 1.2 CAL) 1,000 mL (01/09/21 0332)     LOS: 3 days   Time Spent in minutes   30 minutes  Adora Yeh D.O. on 01/09/2021 at 9:43 AM  Between 7am to 7pm - Please see pager noted on [email or web address]  After 7pm go to [email or web address]  And look for the night coverage person covering for me after hours  [facility] Office  [phone]

## 2021-01-09 NOTE — Progress Notes (Signed)
Subjective: No new complaints   Antibiotics:  Anti-infectives (From admission, onward)   Start     Dose/Rate Route Frequency Ordered Stop   01/08/21 0500  vancomycin (VANCOREADY) IVPB 1250 mg/250 mL  Status:  Discontinued        1,250 mg 166.7 mL/hr over 90 Minutes Intravenous Every 48 hours 01/06/21 1051 01/06/21 2053   01/07/21 2200  ceFEPIme (MAXIPIME) 2 g in sodium chloride 0.9 % 100 mL IVPB  Status:  Discontinued        2 g 200 mL/hr over 30 Minutes Intravenous Every 12 hours 01/07/21 1105 01/09/21 0850   01/06/21 2300  cefTRIAXone (ROCEPHIN) 2 g in sodium chloride 0.9 % 100 mL IVPB  Status:  Discontinued        2 g 200 mL/hr over 30 Minutes Intravenous Every 24 hours 01/06/21 2053 01/07/21 1059   01/06/21 2145  DAPTOmycin (CUBICIN) 560 mg in sodium chloride 0.9 % IVPB  Status:  Discontinued        560 mg 222.4 mL/hr over 30 Minutes Intravenous Daily 01/06/21 2053 01/09/21 0850   01/06/21 1400  piperacillin-tazobactam (ZOSYN) IVPB 3.375 g  Status:  Discontinued       "Followed by" Linked Group Details   3.375 g 12.5 mL/hr over 240 Minutes Intravenous Every 8 hours 01/06/21 0836 01/06/21 2053   01/06/21 1030  rifaximin (XIFAXAN) tablet 550 mg        550 mg Per Tube 2 times daily 01/06/21 0910     01/06/21 0845  piperacillin-tazobactam (ZOSYN) IVPB 3.375 g       "Followed by" Linked Group Details   3.375 g 100 mL/hr over 30 Minutes Intravenous  Once 01/06/21 0836 01/06/21 1010   01/06/21 0830  piperacillin-tazobactam (ZOSYN) IVPB 3.375 g  Status:  Discontinued        3.375 g 100 mL/hr over 30 Minutes Intravenous Every 8 hours 01/06/21 0826 01/06/21 0836   01/06/21 0345  ceFEPIme (MAXIPIME) 2 g in sodium chloride 0.9 % 100 mL IVPB        2 g 200 mL/hr over 30 Minutes Intravenous  Once 01/06/21 0332 01/06/21 0435   01/06/21 0345  metroNIDAZOLE (FLAGYL) IVPB 500 mg        500 mg 100 mL/hr over 60 Minutes Intravenous  Once 01/06/21 0332 01/06/21 0753   01/06/21 0345   vancomycin (VANCOREADY) IVPB 1000 mg/200 mL  Status:  Discontinued        1,000 mg 200 mL/hr over 60 Minutes Intravenous  Once 01/06/21 0332 01/06/21 0341   01/06/21 0345  vancomycin (VANCOREADY) IVPB 1500 mg/300 mL        1,500 mg 150 mL/hr over 120 Minutes Intravenous  Once 01/06/21 0341 01/06/21 0643      Medications: Scheduled Meds: . enoxaparin (LOVENOX) injection  40 mg Subcutaneous Q24H  . feeding supplement (PROSource TF)  45 mL Per Tube Daily  . free water  200 mL Per Tube Q6H  . lactulose  20 g Per Tube BID  . levothyroxine  125 mcg Per Tube QAC breakfast  . multivitamin with minerals  1 tablet Per Tube Daily  . nystatin ointment  1 application Topical BID  . pantoprazole sodium  40 mg Per Tube BID  . rifaximin  550 mg Per Tube BID  . thiamine  100 mg Per Tube Daily   Continuous Infusions: . sodium chloride Stopped (01/08/21 2057)  . feeding supplement (OSMOLITE 1.2 CAL) 1,000 mL (01/09/21 0332)  PRN Meds:.sodium chloride, acetaminophen, albuterol, morphine    Objective: Weight change: -1.6 kg  Intake/Output Summary (Last 24 hours) at 01/09/2021 1137 Last data filed at 01/09/2021 0944 Gross per 24 hour  Intake 798.35 ml  Output 2450 ml  Net -1651.65 ml   Blood pressure (!) 123/58, pulse 78, temperature 99.3 F (37.4 C), temperature source Oral, resp. rate 19, height 5\' 4"  (1.626 m), weight 69.9 kg, SpO2 98 %. Temp:  [97.4 F (36.3 C)-99.3 F (37.4 C)] 99.3 F (37.4 C) (03/08 1056) Pulse Rate:  [78] 78 (03/07 1614) Resp:  [17-24] 19 (03/08 0500) BP: (119-130)/(53-79) 123/58 (03/08 0400) SpO2:  [93 %-100 %] 98 % (03/08 0500) Weight:  [69.9 kg-70.1 kg] 69.9 kg (03/08 0338)  Physical Exam: Physical Exam Vitals reviewed.  HENT:     Head: Normocephalic.  Eyes:     Extraocular Movements: Extraocular movements intact.  Cardiovascular:     Rate and Rhythm: Tachycardia present.  Pulmonary:     Effort: No respiratory distress.     Breath sounds: No  wheezing.  Abdominal:     General: There is no distension.     Palpations: There is no mass.  Skin:    Coloration: Skin is pale.  Neurological:     General: No focal deficit present.     Mental Status: She is alert. She is disoriented.  Psychiatric:        Mood and Affect: Mood normal.        Speech: Speech is delayed.        Behavior: Behavior is cooperative.        Cognition and Memory: Cognition is impaired. Memory is impaired.      CBC:    BMET Recent Labs    01/08/21 0432 01/09/21 0340  NA 134* 135  K 4.2 4.9  CL 106 105  CO2 18* 22  GLUCOSE 126* 127*  BUN 18 15  CREATININE 0.87 0.72  CALCIUM 8.2* 8.1*     Liver Panel  Recent Labs    01/07/21 0300 01/08/21 0432  PROT 6.5 6.7  ALBUMIN 2.1* 2.1*  AST 52* 50*  ALT 42 44  ALKPHOS 211* 206*  BILITOT 1.0 0.7       Sedimentation Rate No results for input(s): ESRSEDRATE in the last 72 hours. C-Reactive Protein No results for input(s): CRP in the last 72 hours.  Micro Results: Recent Results (from the past 720 hour(s))  Resp Panel by RT-PCR (Flu A&B, Covid) Nasopharyngeal Swab     Status: None   Collection Time: 12/19/20  8:56 AM   Specimen: Nasopharyngeal Swab; Nasopharyngeal(NP) swabs in vial transport medium  Result Value Ref Range Status   SARS Coronavirus 2 by RT PCR NEGATIVE NEGATIVE Final    Comment: (NOTE) SARS-CoV-2 target nucleic acids are NOT DETECTED.  The SARS-CoV-2 RNA is generally detectable in upper respiratory specimens during the acute phase of infection. The lowest concentration of SARS-CoV-2 viral copies this assay can detect is 138 copies/mL. A negative result does not preclude SARS-Cov-2 infection and should not be used as the sole basis for treatment or other patient management decisions. A negative result may occur with  improper specimen collection/handling, submission of specimen other than nasopharyngeal swab, presence of viral mutation(s) within the areas targeted by  this assay, and inadequate number of viral copies(<138 copies/mL). A negative result must be combined with clinical observations, patient history, and epidemiological information. The expected result is Negative.  Fact Sheet for Patients:  BloggerCourse.com  Fact  Sheet for Healthcare Providers:  SeriousBroker.it  This test is no t yet approved or cleared by the [place] FDA and  has been authorized for detection and/or diagnosis of SARS-CoV-2 by FDA under an Emergency Use Authorization (EUA). This EUA will remain  in effect (meaning this test can be used) for the duration of the COVID-19 declaration under Section 564(b)(1) of the Act, 21 U.S.C.section 360bbb-3(b)(1), unless the authorization is terminated  or revoked sooner.       Influenza A by PCR NEGATIVE NEGATIVE Final   Influenza B by PCR NEGATIVE NEGATIVE Final    Comment: (NOTE) The Xpert Xpress SARS-CoV-2/FLU/RSV plus assay is intended as an aid in the diagnosis of influenza from Nasopharyngeal swab specimens and should not be used as a sole basis for treatment. Nasal washings and aspirates are unacceptable for Xpert Xpress SARS-CoV-2/FLU/RSV testing.  Fact Sheet for Patients: BloggerCourse.com  Fact Sheet for Healthcare Providers: SeriousBroker.it  This test is not yet approved or cleared by the [place] FDA and has been authorized for detection and/or diagnosis of SARS-CoV-2 by FDA under an Emergency Use Authorization (EUA). This EUA will remain in effect (meaning this test can be used) for the duration of the COVID-19 declaration under Section 564(b)(1) of the Act, 21 U.S.C. section 360bbb-3(b)(1), unless the authorization is terminated or revoked.  Performed at [facility], [address] [place], [place] [address]   Urine culture     Status: Abnormal   Collection Time: 01/06/21  3:32 AM    Specimen: In/Out Cath Urine  Result Value Ref Range Status   Specimen Description IN/OUT CATH URINE  Final   Special Requests   Final    NONE Performed at [facility], [address], [place], [place] [address]    Culture 20,000 COLONIES/mL PSEUDOMONAS AERUGINOSA (A)  Final   Report Status 01/08/2021 FINAL  Final   Organism ID, Bacteria PSEUDOMONAS AERUGINOSA (A)  Final      Susceptibility   Pseudomonas aeruginosa - MIC*    CEFTAZIDIME >=64 RESISTANT Resistant     CIPROFLOXACIN <=0.25 SENSITIVE Sensitive     GENTAMICIN 2 SENSITIVE Sensitive     IMIPENEM 2 SENSITIVE Sensitive     * 20,000 COLONIES/mL PSEUDOMONAS AERUGINOSA  Blood Culture (routine x 2)     Status: Abnormal (Preliminary result)   Collection Time: 01/06/21  3:50 AM   Specimen: BLOOD  Result Value Ref Range Status   Specimen Description BLOOD SITE NOT SPECIFIED  Final   Special Requests   Final    BOTTLES DRAWN AEROBIC AND ANAEROBIC Blood Culture results may not be optimal due to an inadequate volume of blood received in culture bottles   Culture  Setup Time   Final    GRAM POSITIVE COCCI IN CHAINS IN PAIRS GRAM NEGATIVE RODS ANAEROBIC BOTTLE ONLY CRITICAL RESULT CALLED TO, READ BACK BY AND VERIFIED WITH: Malva Cogan PHARMD  01/06/21 EB    Culture (A)  Final    PROTEUS MIRABILIS ENTEROCOCCUS FAECALIS CULTURE REINCUBATED FOR BETTER GROWTH    Report Status PENDING  Incomplete   Organism ID, Bacteria PROTEUS MIRABILIS  Final      Susceptibility   Proteus mirabilis - MIC*    AMPICILLIN <=2 SENSITIVE Sensitive     CEFAZOLIN <=4 SENSITIVE Sensitive     CEFEPIME <=0.12 SENSITIVE Sensitive     CEFTAZIDIME <=1 SENSITIVE Sensitive     CEFTRIAXONE <=0.25 SENSITIVE Sensitive     CIPROFLOXACIN <=0.25 SENSITIVE Sensitive  GENTAMICIN <=1 SENSITIVE Sensitive     IMIPENEM 2 SENSITIVE Sensitive     TRIMETH/SULFA <=20 SENSITIVE Sensitive     AMPICILLIN/SULBACTAM <=2 SENSITIVE Sensitive     PIP/TAZO Value in next  row Sensitive      <=4 SENSITIVEPerformed at [facility], [address] [place], [place] [address]    * PROTEUS MIRABILIS  Blood Culture ID Panel (Reflexed)     Status: Abnormal   Collection Time: 01/06/21  3:50 AM  Result Value Ref Range Status   Enterococcus faecalis DETECTED (A) NOT DETECTED Final    Comment: CRITICAL RESULT CALLED TO, READ BACK BY AND VERIFIED WITH: KIM HURTH PHARMD @1910  01/06/21 EB    Enterococcus Faecium DETECTED (A) NOT DETECTED Final    Comment: CRITICAL RESULT CALLED TO, READ BACK BY AND VERIFIED WITH: KIM HURTH PHARMD @1910  01/06/21 EB    Listeria monocytogenes NOT DETECTED NOT DETECTED Final   Staphylococcus species NOT DETECTED NOT DETECTED Final   Staphylococcus aureus (BCID) NOT DETECTED NOT DETECTED Final   Staphylococcus epidermidis NOT DETECTED NOT DETECTED Final   Staphylococcus lugdunensis NOT DETECTED NOT DETECTED Final   Streptococcus species DETECTED (A) NOT DETECTED Final    Comment: CRITICAL RESULT CALLED TO, READ BACK BY AND VERIFIED WITH: KIM HURTH PHARMD @1910  01/06/21 EB    Streptococcus agalactiae DETECTED (A) NOT DETECTED Final    Comment: CRITICAL RESULT CALLED TO, READ BACK BY AND VERIFIED WITH: KIM HURTH PHARMD @1910  01/06/21 EB    Streptococcus pneumoniae NOT DETECTED NOT DETECTED Final   Streptococcus pyogenes NOT DETECTED NOT DETECTED Final   A.calcoaceticus-baumannii NOT DETECTED NOT DETECTED Final   Bacteroides fragilis NOT DETECTED NOT DETECTED Final   Enterobacterales DETECTED (A) NOT DETECTED Final    Comment: Enterobacterales represent a large order of gram negative bacteria, not a single organism. CRITICAL RESULT CALLED TO, READ BACK BY AND VERIFIED WITH: KIM HURTH PHARMD @1910  01/06/21 EB    Enterobacter cloacae complex NOT DETECTED NOT DETECTED Final   Escherichia coli NOT DETECTED NOT DETECTED Final   Klebsiella aerogenes NOT DETECTED NOT DETECTED Final   Klebsiella oxytoca NOT DETECTED NOT DETECTED Final    Klebsiella pneumoniae NOT DETECTED NOT DETECTED Final   Proteus species DETECTED (A) NOT DETECTED Final    Comment: CRITICAL RESULT CALLED TO, READ BACK BY AND VERIFIED WITH: KIM HURTH PHARMD @1910  01/06/21 EB    Salmonella species NOT DETECTED NOT DETECTED Final   Serratia marcescens NOT DETECTED NOT DETECTED Final   Haemophilus influenzae NOT DETECTED NOT DETECTED Final   Neisseria meningitidis NOT DETECTED NOT DETECTED Final   Pseudomonas aeruginosa NOT DETECTED NOT DETECTED Final   Stenotrophomonas maltophilia NOT DETECTED NOT DETECTED Final   Candida albicans NOT DETECTED NOT DETECTED Final   Candida auris NOT DETECTED NOT DETECTED Final   Candida glabrata NOT DETECTED NOT DETECTED Final   Candida krusei NOT DETECTED NOT DETECTED Final   Candida parapsilosis NOT DETECTED NOT DETECTED Final   Candida tropicalis NOT DETECTED NOT DETECTED Final   Cryptococcus neoformans/gattii NOT DETECTED NOT DETECTED Final   CTX-M ESBL NOT DETECTED NOT DETECTED Final   Carbapenem resistance IMP NOT DETECTED NOT DETECTED Final   Carbapenem resistance KPC NOT DETECTED NOT DETECTED Final   Carbapenem resistance NDM NOT DETECTED NOT DETECTED Final   Carbapenem resist OXA 48 LIKE NOT DETECTED NOT DETECTED Final   Vancomycin resistance DETECTED (A) NOT DETECTED Final    Comment: CRITICAL RESULT CALLED TO, READ BACK BY AND VERIFIED  WITH: Malva Cogan PHARMD @1910  01/06/21 EB    Carbapenem resistance VIM NOT DETECTED NOT DETECTED Final    Comment: Performed at [facility], [address] [place], [place] [address]  Blood Culture (routine x 2)     Status: None (Preliminary result)   Collection Time: 01/06/21  3:52 AM   Specimen: BLOOD  Result Value Ref Range Status   Specimen Description BLOOD SITE NOT SPECIFIED  Final   Special Requests   Final    BOTTLES DRAWN AEROBIC AND ANAEROBIC Blood Culture adequate volume   Culture   Final    NO GROWTH 2 DAYS Performed at [facility], [address] [place], [place] [address]    Report Status PENDING  Incomplete  Resp Panel by RT-PCR (Flu A&B, Covid) Nasopharyngeal Swab     Status: None   Collection Time: 01/06/21  4:37 AM   Specimen: Nasopharyngeal Swab; Nasopharyngeal(NP) swabs in vial transport medium  Result Value Ref Range Status   SARS Coronavirus 2 by RT PCR NEGATIVE NEGATIVE Final    Comment: (NOTE) SARS-CoV-2 target nucleic acids are NOT DETECTED.  The SARS-CoV-2 RNA is generally detectable in upper respiratory specimens during the acute phase of infection. The lowest concentration of SARS-CoV-2 viral copies this assay can detect is 138 copies/mL. A negative result does not preclude SARS-Cov-2 infection and should not be used as the sole basis for treatment or other patient management decisions. A negative result may occur with  improper specimen collection/handling, submission of specimen other than nasopharyngeal swab, presence of viral mutation(s) within the areas targeted by this assay, and inadequate number of viral copies(<138 copies/mL). A negative result must be combined with clinical observations, patient history, and epidemiological information. The expected result is Negative.  Fact Sheet for Patients:  BloggerCourse.com  Fact Sheet for Healthcare Providers:  SeriousBroker.it  This test is no t yet approved or cleared by the [place] FDA and  has been authorized for detection and/or diagnosis of SARS-CoV-2 by FDA under an Emergency Use Authorization (EUA). This EUA will remain  in effect (meaning this test can be used) for the duration of the COVID-19 declaration under Section 564(b)(1) of the Act, 21 U.S.C.section 360bbb-3(b)(1), unless the authorization is terminated  or revoked sooner.       Influenza A by PCR NEGATIVE NEGATIVE Final   Influenza B by PCR NEGATIVE NEGATIVE Final    Comment: (NOTE) The Xpert Xpress SARS-CoV-2/FLU/RSV plus assay  is intended as an aid in the diagnosis of influenza from Nasopharyngeal swab specimens and should not be used as a sole basis for treatment. Nasal washings and aspirates are unacceptable for Xpert Xpress SARS-CoV-2/FLU/RSV testing.  Fact Sheet for Patients: BloggerCourse.com  Fact Sheet for Healthcare Providers: SeriousBroker.it  This test is not yet approved or cleared by the [place] FDA and has been authorized for detection and/or diagnosis of SARS-CoV-2 by FDA under an Emergency Use Authorization (EUA). This EUA will remain in effect (meaning this test can be used) for the duration of the COVID-19 declaration under Section 564(b)(1) of the Act, 21 U.S.C. section 360bbb-3(b)(1), unless the authorization is terminated or revoked.  Performed at [facility], [address] [place], [place] [address]   Culture, blood (routine x 2)     Status: None (Preliminary result)   Collection Time: 01/07/21 12:14 PM   Specimen: BLOOD  Result Value Ref Range Status   Specimen Description BLOOD SITE NOT SPECIFIED  Final  Special Requests   Final    BOTTLES DRAWN AEROBIC ONLY Blood Culture results may not be optimal due to an inadequate volume of blood received in culture bottles   Culture  Setup Time   Final    AEROBIC BOTTLE ONLY GRAM POSITIVE COCCI CRITICAL RESULT CALLED TO, READ BACK BY AND VERIFIED WITH: L SEAY PHARMD 01/09/21 0344 JDW    Culture   Final    NO GROWTH < 24 HOURS Performed at [facility], [address], [place], [place] [address]    Report Status PENDING  Incomplete  Culture, blood (routine x 2)     Status: None (Preliminary result)   Collection Time: 01/07/21 12:14 PM   Specimen: BLOOD  Result Value Ref Range Status   Specimen Description BLOOD SITE NOT SPECIFIED  Final   Special Requests   Final    BOTTLES DRAWN AEROBIC ONLY Blood Culture results may not be optimal due to an inadequate volume of  blood received in culture bottles   Culture   Final    NO GROWTH < 24 HOURS Performed at [facility], [address] [place], [place] [address]    Report Status PENDING  Incomplete  Blood Culture ID Panel (Reflexed)     Status: Abnormal   Collection Time: 01/07/21 12:14 PM  Result Value Ref Range Status   Enterococcus faecalis NOT DETECTED NOT DETECTED Final   Enterococcus Faecium NOT DETECTED NOT DETECTED Final   Listeria monocytogenes NOT DETECTED NOT DETECTED Final   Staphylococcus species DETECTED (A) NOT DETECTED Final    Comment: CRITICAL RESULT CALLED TO, READ BACK BY AND VERIFIED WITH: L SEAY PHARMD 01/09/21 0344 JDW    Staphylococcus aureus (BCID) NOT DETECTED NOT DETECTED Final   Staphylococcus epidermidis DETECTED (A) NOT DETECTED Final    Comment: Methicillin (oxacillin) resistant coagulase negative staphylococcus. Possible blood culture contaminant (unless isolated from more than one blood culture draw or clinical case suggests pathogenicity). No antibiotic treatment is indicated for blood  culture contaminants. CRITICAL RESULT CALLED TO, READ BACK BY AND VERIFIED WITH: L SEAY PHARMD 01/09/21 0344 JDW    Staphylococcus lugdunensis NOT DETECTED NOT DETECTED Final   Streptococcus species NOT DETECTED NOT DETECTED Final   Streptococcus agalactiae NOT DETECTED NOT DETECTED Final   Streptococcus pneumoniae NOT DETECTED NOT DETECTED Final   Streptococcus pyogenes NOT DETECTED NOT DETECTED Final   A.calcoaceticus-baumannii NOT DETECTED NOT DETECTED Final   Bacteroides fragilis NOT DETECTED NOT DETECTED Final   Enterobacterales NOT DETECTED NOT DETECTED Final   Enterobacter cloacae complex NOT DETECTED NOT DETECTED Final   Escherichia coli NOT DETECTED NOT DETECTED Final   Klebsiella aerogenes NOT DETECTED NOT DETECTED Final   Klebsiella oxytoca NOT DETECTED NOT DETECTED Final   Klebsiella pneumoniae NOT DETECTED NOT DETECTED Final   Proteus species NOT DETECTED NOT  DETECTED Final   Salmonella species NOT DETECTED NOT DETECTED Final   Serratia marcescens NOT DETECTED NOT DETECTED Final   Haemophilus influenzae NOT DETECTED NOT DETECTED Final   Neisseria meningitidis NOT DETECTED NOT DETECTED Final   Pseudomonas aeruginosa NOT DETECTED NOT DETECTED Final   Stenotrophomonas maltophilia NOT DETECTED NOT DETECTED Final   Candida albicans NOT DETECTED NOT DETECTED Final   Candida auris NOT DETECTED NOT DETECTED Final   Candida glabrata NOT DETECTED NOT DETECTED Final   Candida krusei NOT DETECTED NOT DETECTED Final   Candida parapsilosis NOT DETECTED NOT DETECTED Final   Candida tropicalis NOT DETECTED NOT DETECTED Final  Cryptococcus neoformans/gattii NOT DETECTED NOT DETECTED Final   Methicillin resistance mecA/C DETECTED (A) NOT DETECTED Final    Comment: CRITICAL RESULT CALLED TO, READ BACK BY AND VERIFIED WITH: L SEAY PHARMD 01/09/21 0344 JDW Performed at [facility], [address] [place], [place] [address]     Studies/Results: CT ANGIO CHEST PE W OR WO CONTRAST  Result Date: 01/07/2021 CLINICAL DATA:  Hypoxemia and bacteremia EXAM: CT ANGIOGRAPHY CHEST CT ABDOMEN AND PELVIS WITH CONTRAST TECHNIQUE: Multidetector CT imaging of the chest was performed using the standard protocol during bolus administration of intravenous contrast. Multiplanar CT image reconstructions and MIPs were obtained to evaluate the vascular anatomy. Multidetector CT imaging of the abdomen and pelvis was performed using the standard protocol during bolus administration of intravenous contrast. CONTRAST:  75mL OMNIPAQUE IOHEXOL 350 MG/ML SOLN COMPARISON:  Ultrasound 01/07/2021 radiograph 01/06/2021 CT 1222 FINDINGS: CTA CHEST FINDINGS Cardiovascular: Satisfactory opacification of pulmonary arteries. No visible pulmonary arterial filling defects though respiratory motion may limit detection of smaller segmental and subsegmental filling defects. Central pulmonary arteries are  enlarged though similar to prior caliber. Cardiac size is within normal limits. No pericardial effusion. Three-vessel coronary artery atherosclerosis. The aortic root is suboptimally assessed given cardiac pulsation artifact. Atherosclerotic plaque throughout the thoracic aorta. Dilatation of the distal thoracic aortic arch to 3.6 cm, returning to a more normal caliber by the level of the diaphragmatic hiatus of 2.9 cm. Mild aortic tortuosity, can be senescent. No acute luminal abnormality of the imaged aorta within the limitations of the suboptimally opacified exam. No periaortic stranding or hemorrhage. Normal 3 vessel branching of the aortic arch with calcifications in the proximal great vessels. No major venous abnormalities. Mediastinum/Nodes: No mediastinal fluid or gas. Normal thyroid gland and thoracic inlet. No acute abnormality of the trachea or esophagus. No worrisome mediastinal, hilar or axillary adenopathy. Lungs/Pleura: Low volumes and atelectasis on water likely mild emphysematous changes. Some additional vascular redistribution, septal thickening is noted as well, can reflect mild interstitial edema. Trace pleural effusions, right greater than left, with dependent and passive atelectatic changes in the lung bases. No concerning pulmonary nodules or masses. Additional bandlike areas of opacity likely reflect scarring and or atelectasis. Musculoskeletal: Multilevel degenerative changes are present in the imaged portions of the spine. Likely remote anterior wedging deformities T10, T12, unchanged from comparison. Superimposed Schmorl's node formations and vacuum disc phenomenon. No acute fracture or or conspicuous osseous abnormality is seen. Mild body wall edema. No worrisome chest wall mass or lesion. Benign macro calcifications seen in the breast tissues, suspect many of which are mass killer. Review of the MIP images confirms the above findings. CT ABDOMEN and PELVIS FINDINGS Hepatobiliary: No  worrisome focal liver lesions. Smooth liver surface contour. Normal hepatic attenuation. Gallbladder decompressed. No pericholecystic fluid or inflammation. No visible calcified gallstone or biliary ductal dilatation. Pancreas: No pancreatic ductal dilatation or surrounding inflammatory changes. Spleen: Normal in size. No concerning splenic lesions. Adrenals/Urinary Tract: Normal adrenals. Kidneys enhance and excrete symmetrically. Bilateral extrarenal pelves. Question some mild bladder wall hyperemia and urothelial thickening which could reflect ascending infection in the appropriate clinical context. Additionally, there is a punctate radiodensity layering in the left posterolateral bladder, separate from the ureterovesicular junction, suggestive of a layering bladder calculus. Stomach/Bowel: Extensive vascular collateralization in varices formation about the distal esophagus and proximal stomach. Percutaneous gastrostomy tube in place with adequate pexy of the anterior wall of the gastric body to the anterior abdominal wall. No acute complication. Normally seated  balloon. Duodenum is unremarkable accounting for distension. No small bowel thickening or dilatation is seen. High attenuation enteric contrast media traverses through the colon to the level of the rectal vault, lack of formed stool, can be seen with rapid transit state. Scattered colonic diverticula without focal inflammation to suggest diverticulitis. 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 Atherosclerotic calcifications within the abdominal aorta and branch vessels. No other aneurysm or ectasia. No enlarged abdominopelvic lymph nodes. Reproductive: Anteverted uterus. No concerning adnexal lesions. There is slight asymmetric prominence of the left gonadal vein and parametrial vasculature, nonspecific though can be seen in the setting of pelvic congestion. Other: Stranding and inflammatory  changes appears centered upon the right psoas musculature with some additional hazy stranding distributed in the retroperitoneum. Possibly reactive change. No free air. No bowel containing hernia. Musculoskeletal: Interval contraction of the previously expanded right psoas musculature albeit with a persistent hypoattenuating focus centrally within the muscle belly (6/45, 3/53), can reflect a residual intramuscular hematoma or abscess in the setting of sepsis. No other intramuscular collection is seen. Multilevel degenerative changes are noted in the lumbar levels. Levocurvature apex L2-3. Associated pelvic tilt. Degenerative changes in the hips and pelvis as well. No acute or conspicuous osseous lesions. Review of the MIP images confirms the above findings. IMPRESSION: 1. No evidence of acute pulmonary embolism though respiratory motion may limit detection of smaller segmental and subsegmental filling defects. 2. Enlarged central pulmonary arteries, suggestive of pulmonary arterial hypertension. 3. Trace pleural effusions, right greater than left, with dependent and passive atelectatic changes in the lung bases. 4. Question some mild bladder wall hyperemia and urothelial thickening which could reflect ascending infection in the appropriate clinical context. Correlate with urinalysis. 5. Interval contraction of the previously expanded right psoas musculature albeit with a persistent hypoattenuating focus centrally within the muscle belly, can reflect a residual intramuscular hematoma or abscess in the setting of sepsis. Stranding and inflammatory changes appears centered upon the right psoas musculature with some additional hazy stranding distributed in the retroperitoneum, which could reflect reactive change. 6. High attenuation enteric contrast media traverses through the colon to the level of the rectal vault, lack of formed stool, can be seen with rapid transit state. 7. Dilatation of the aortic arch to 3.6 cm.  Recommend annual imaging followup by CTA or MRA. This recommendation follows 2010 ACCF/AHA/AATS/ACR/ASA/SCA/SCAI/SIR/STS/SVM Guidelines for the Diagnosis and Management of Patients with Thoracic Aortic Disease. Circulation.2010; 121: Z610-R604: [id number]. Aortic aneurysm NOS (ICD10-I71.9) 8. 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 Recommend follow-up ultrasound every 3 years. This recommendation follows ACR consensus guidelines: White Paper of the ACR Incidental Findings Committee II on Vascular Findings. J Am Coll Radiol 2013; 10:789-794. 9. Aortic Atherosclerosis (ICD10-I70.0). 10. Coronary artery calcifications are present. Please note that the presence of coronary artery calcium documents the presence of coronary artery disease, the severity of this disease and any potential stenosis cannot be assessed on this non-gated CT examination. Electronically Signed   By: Kreg ShropshirePrice  DeHay M.D.   On: 01/07/2021 19:57   CT ABDOMEN PELVIS W CONTRAST  Result Date: 01/07/2021 CLINICAL DATA:  Hypoxemia and bacteremia EXAM: CT ANGIOGRAPHY CHEST CT ABDOMEN AND PELVIS WITH CONTRAST TECHNIQUE: Multidetector CT imaging of the chest was performed using the standard protocol during bolus administration of intravenous contrast. Multiplanar CT image reconstructions and MIPs were obtained to evaluate the vascular anatomy. Multidetector CT imaging of the abdomen and pelvis was performed using the standard protocol during  bolus administration of intravenous contrast. CONTRAST:  99mL OMNIPAQUE IOHEXOL 350 MG/ML SOLN COMPARISON:  Ultrasound 01/07/2021 radiograph 01/06/2021 CT 1222 FINDINGS: CTA CHEST FINDINGS Cardiovascular: Satisfactory opacification of pulmonary arteries. No visible pulmonary arterial filling defects though respiratory motion may limit detection of smaller segmental and subsegmental filling defects. Central pulmonary arteries are enlarged though similar to prior caliber. Cardiac size is within normal  limits. No pericardial effusion. Three-vessel coronary artery atherosclerosis. The aortic root is suboptimally assessed given cardiac pulsation artifact. Atherosclerotic plaque throughout the thoracic aorta. Dilatation of the distal thoracic aortic arch to 3.6 cm, returning to a more normal caliber by the level of the diaphragmatic hiatus of 2.9 cm. Mild aortic tortuosity, can be senescent. No acute luminal abnormality of the imaged aorta within the limitations of the suboptimally opacified exam. No periaortic stranding or hemorrhage. Normal 3 vessel branching of the aortic arch with calcifications in the proximal great vessels. No major venous abnormalities. Mediastinum/Nodes: No mediastinal fluid or gas. Normal thyroid gland and thoracic inlet. No acute abnormality of the trachea or esophagus. No worrisome mediastinal, hilar or axillary adenopathy. Lungs/Pleura: Low volumes and atelectasis on water likely mild emphysematous changes. Some additional vascular redistribution, septal thickening is noted as well, can reflect mild interstitial edema. Trace pleural effusions, right greater than left, with dependent and passive atelectatic changes in the lung bases. No concerning pulmonary nodules or masses. Additional bandlike areas of opacity likely reflect scarring and or atelectasis. Musculoskeletal: Multilevel degenerative changes are present in the imaged portions of the spine. Likely remote anterior wedging deformities T10, T12, unchanged from comparison. Superimposed Schmorl's node formations and vacuum disc phenomenon. No acute fracture or or conspicuous osseous abnormality is seen. Mild body wall edema. No worrisome chest wall mass or lesion. Benign macro calcifications seen in the breast tissues, suspect many of which are mass killer. Review of the MIP images confirms the above findings. CT ABDOMEN and PELVIS FINDINGS Hepatobiliary: No worrisome focal liver lesions. Smooth liver surface contour. Normal hepatic  attenuation. Gallbladder decompressed. No pericholecystic fluid or inflammation. No visible calcified gallstone or biliary ductal dilatation. Pancreas: No pancreatic ductal dilatation or surrounding inflammatory changes. Spleen: Normal in size. No concerning splenic lesions. Adrenals/Urinary Tract: Normal adrenals. Kidneys enhance and excrete symmetrically. Bilateral extrarenal pelves. Question some mild bladder wall hyperemia and urothelial thickening which could reflect ascending infection in the appropriate clinical context. Additionally, there is a punctate radiodensity layering in the left posterolateral bladder, separate from the ureterovesicular junction, suggestive of a layering bladder calculus. Stomach/Bowel: Extensive vascular collateralization in varices formation about the distal esophagus and proximal stomach. Percutaneous gastrostomy tube in place with adequate pexy of the anterior wall of the gastric body to the anterior abdominal wall. No acute complication. Normally seated balloon. Duodenum is unremarkable accounting for distension. No small bowel thickening or dilatation is seen. High attenuation enteric contrast media traverses through the colon to the level of the rectal vault, lack of formed stool, can be seen with rapid transit state. Scattered colonic diverticula without focal inflammation to suggest diverticulitis. 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 Atherosclerotic calcifications within the abdominal aorta and branch vessels. No other aneurysm or ectasia. No enlarged abdominopelvic lymph nodes. Reproductive: Anteverted uterus. No concerning adnexal lesions. There is slight asymmetric prominence of the left gonadal vein and parametrial vasculature, nonspecific though can be seen in the setting of pelvic congestion. Other: Stranding and inflammatory changes appears centered upon the right psoas musculature with some additional  hazy stranding distributed in the retroperitoneum. Possibly reactive change. No free air. No bowel containing hernia. Musculoskeletal: Interval contraction of the previously expanded right psoas musculature albeit with a persistent hypoattenuating focus centrally within the muscle belly (6/45, 3/53), can reflect a residual intramuscular hematoma or abscess in the setting of sepsis. No other intramuscular collection is seen. Multilevel degenerative changes are noted in the lumbar levels. Levocurvature apex L2-3. Associated pelvic tilt. Degenerative changes in the hips and pelvis as well. No acute or conspicuous osseous lesions. Review of the MIP images confirms the above findings. IMPRESSION: 1. No evidence of acute pulmonary embolism though respiratory motion may limit detection of smaller segmental and subsegmental filling defects. 2. Enlarged central pulmonary arteries, suggestive of pulmonary arterial hypertension. 3. Trace pleural effusions, right greater than left, with dependent and passive atelectatic changes in the lung bases. 4. Question some mild bladder wall hyperemia and urothelial thickening which could reflect ascending infection in the appropriate clinical context. Correlate with urinalysis. 5. Interval contraction of the previously expanded right psoas musculature albeit with a persistent hypoattenuating focus centrally within the muscle belly, can reflect a residual intramuscular hematoma or abscess in the setting of sepsis. Stranding and inflammatory changes appears centered upon the right psoas musculature with some additional hazy stranding distributed in the retroperitoneum, which could reflect reactive change. 6. High attenuation enteric contrast media traverses through the colon to the level of the rectal vault, lack of formed stool, can be seen with rapid transit state. 7. Dilatation of the aortic arch to 3.6 cm. Recommend annual imaging followup by CTA or MRA. This recommendation follows  2010 ACCF/AHA/AATS/ACR/ASA/SCA/SCAI/SIR/STS/SVM Guidelines for the Diagnosis and Management of Patients with Thoracic Aortic Disease. Circulation.2010; 121: Z610-R604. Aortic aneurysm NOS (ICD10-I71.9) 8. 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 Recommend follow-up ultrasound every 3 years. This recommendation follows ACR consensus guidelines: White Paper of the ACR Incidental Findings Committee II on Vascular Findings. J Am Coll Radiol 2013; 10:789-794. 9. Aortic Atherosclerosis (ICD10-I70.0). 10. Coronary artery calcifications are present. Please note that the presence of coronary artery calcium documents the presence of coronary artery disease, the severity of this disease and any potential stenosis cannot be assessed on this non-gated CT examination. Electronically Signed   By: Kreg Shropshire M.D.   On: 01/07/2021 19:57      Assessment/Plan:  INTERVAL HISTORY:  Repeat blood cultures taken are growing a staph hominis species which is undoubtedly contaminant as well.  Principal Problem:   Polymicrobial bacterial infection Active Problems:   Hypothyroid   Acute metabolic encephalopathy   GI bleed   Acute respiratory failure with hypoxia and hypercapnia (HCC)   Sepsis (HCC)   Acute lower UTI   Normocytic anemia   Dysphagia   PEG (percutaneous endoscopic gastrostomy) status (HCC)   Hypotension    Anaija Wissink is a 80 y.o. female with with multiple medical problems including NASH with cirrhosis varices GI bleed was admitted with hypoxia and concern for sepsis. One of her blood cultures is yielded multiple organisms with Enterococcus faecalis and vancomycin resistant Enterococcus VCM identified by [facility]. She also had a Proteus mirabilis species identified which is growing in culture. The other blood culture was completely sterile. She has been on daptomycin with ceftriaxone which was recently changed to cefepime.  Her urine culture grew yielded only 20,000  colony-forming units of Pseudomonas aeruginosa. At first glance this culture would appear to be a contaminant given that nothing grew in one side and multiple organisms  grew in the other.  She does have on imaging evidence of an abnormal area in her psoas muscle which is diminished compared to imaging on the eighth of February.  It is conceivable that she has a psoas muscle abscess that is causing her bacteremia but I don't understand why it would be getting smaller despite not receiving antimicrobial therapy for it.  We have thoroughly discussed case with our team and believe that her cultures with multiple organisms in them very likely represent contamination.  I think the prudent maneuver at present is to stop all antibiotics and observe her off antibiotics if she develops fevers and evidence of infection 1 can make sure that she gets repeat blood cultures with thorough cleaning of the sites prior to drawing of blood cultures.  If they are then positive I would recommend getting an MRI of the abdomen and pelvis to further elucidate this area of hematoma versus abscess.  Therefore for now we are stopping antibiotics         LOS: 3 days   Acey Lav 01/09/2021, 11:37 AM

## 2021-01-09 NOTE — Progress Notes (Signed)
Nutrition Follow-up  DOCUMENTATION CODES:   Not applicable  INTERVENTION:   -Continue MVI with minerals daily  -Continue tube feeding via G-tube: Osmolite 1.2 at 60 ml/h (1440 ml per day) Prosource TF 45 ml once daily  Provides 1768 kcal, 91 gm protein, 1181 ml free water daily  Free water flushes 200 ml QID for a total of 1981 ml free water daily.  NUTRITION DIAGNOSIS:   Inadequate oral intake related to dysphagia as evidenced by NPO status.  Progressing; advanced to dysphagia 1 diet on 01/08/21  GOAL:   Patient will meet greater than or equal to 90% of their needs  Met with TF  MONITOR:   TF tolerance,Diet advancement,Labs,Skin  REASON FOR ASSESSMENT:   Consult Enteral/tube feeding initiation and management  ASSESSMENT:   80 yo female admitted with severe sepsis 2/2 UTI. PMH includes hypothyroidism, NASH cirrhosis, portal HTN, GI bleed, esophageal/gastric varices, dysphagia, PEG, COPD.  3/7- s/p BSE- advanced to dysphagia 1 diet with thin liquids  Reviewed I/O's: -1.6 L x 24 hours and +1.3 L since admission  UOP: 2.6 L x 24 hours  Pt very lethargic at time of visit. She opened her eyes briefly for this RD and confirmed that she was not in any pain. She was unable to answer other questions.   Per nurse tech, pt refused lunch and consumed only a few bites of grits this morning. Pt continues to tolerate TF well. Will continue with continuous feeds given lethargy and poor oral intake.   Medications reviewed and include thiamine and lactulose.   Labs reviewed: CBGS: 114-129 (inpatient orders for glycemic control are none).   NUTRITION - FOCUSED PHYSICAL EXAM:  Flowsheet Row Most Recent Value  Orbital Region No depletion  Upper Arm Region Mild depletion  Thoracic and Lumbar Region No depletion  Buccal Region Mild depletion  Temple Region No depletion  Clavicle Bone Region No depletion  Clavicle and Acromion Bone Region No depletion  Scapular Bone  Region No depletion  Dorsal Hand Mild depletion  Patellar Region Mild depletion  Anterior Thigh Region Mild depletion  Posterior Calf Region Mild depletion  Edema (RD Assessment) None  Hair Reviewed  Eyes Reviewed  Mouth Reviewed  Skin Reviewed  Nails Reviewed       Diet Order:   Diet Order            Diet NPO time specified  Diet effective midnight           DIET - DYS 1 Room service appropriate? No; Fluid consistency: Thin  Diet effective now                 EDUCATION NEEDS:   Not appropriate for education at this time  Skin:  Skin Assessment: Skin Integrity Issues: Skin Integrity Issues:: Stage II,Other (Comment) Stage II: lt elbow, sacrum Incisions: - Other: MASD perineum  Last BM:  01/09/21  Height:   Ht Readings from Last 1 Encounters:  01/07/21 _0  (1.626 m)    Weight:   Wt Readings from Last 1 Encounters:  01/09/21 69.9 kg    Ideal Body Weight:  54.5 kg  BMI:  Body mass index is 26.45 kg/m.  Estimated Nutritional Needs:   Kcal:  1600-1800  Protein:  85-100 gm  Fluid:  >/= 1.6 L    Loistine Chance, RD, LDN, [place] Registered Dietitian II Certified Diabetes Care and Education Specialist Please refer to [facility] for RD and/or RD on-call/weekend/after hours pager

## 2021-01-10 ENCOUNTER — Encounter (HOSPITAL_COMMUNITY)
Admission: EM | Disposition: A | Discharge status: Skilled Nursing Facility | Payer: Self-pay | Source: Skilled Nursing Facility | Attending: Internal Medicine

## 2021-01-10 ENCOUNTER — Inpatient Hospital Stay (HOSPITAL_COMMUNITY): Payer: Medicare Other | Admitting: Certified Registered Nurse Anesthetist

## 2021-01-10 DIAGNOSIS — Z7189 Other specified counseling: Secondary | ICD-10-CM

## 2021-01-10 DIAGNOSIS — I8510 Secondary esophageal varices without bleeding: Secondary | ICD-10-CM | POA: Diagnosis not present

## 2021-01-10 DIAGNOSIS — Z515 Encounter for palliative care: Secondary | ICD-10-CM

## 2021-01-10 DIAGNOSIS — N390 Urinary tract infection, site not specified: Secondary | ICD-10-CM | POA: Diagnosis not present

## 2021-01-10 DIAGNOSIS — J9601 Acute respiratory failure with hypoxia: Secondary | ICD-10-CM | POA: Diagnosis not present

## 2021-01-10 DIAGNOSIS — A419 Sepsis, unspecified organism: Secondary | ICD-10-CM | POA: Diagnosis not present

## 2021-01-10 DIAGNOSIS — G9341 Metabolic encephalopathy: Secondary | ICD-10-CM | POA: Diagnosis not present

## 2021-01-10 HISTORY — PX: ESOPHAGEAL BANDING: SHX5518

## 2021-01-10 HISTORY — PX: ESOPHAGOGASTRODUODENOSCOPY (EGD) WITH PROPOFOL: SHX5813

## 2021-01-10 LAB — CBC
HCT: 28 % — ABNORMAL LOW (ref 36.0–46.0)
Hemoglobin: 8.3 g/dL — ABNORMAL LOW (ref 12.0–15.0)
MCH: 28.4 pg (ref 26.0–34.0)
MCHC: 29.6 g/dL — ABNORMAL LOW (ref 30.0–36.0)
MCV: 95.9 fL (ref 80.0–100.0)
Platelets: 352 10*3/uL (ref 150–400)
RBC: 2.92 MIL/uL — ABNORMAL LOW (ref 3.87–5.11)
RDW: 17.9 % — ABNORMAL HIGH (ref 11.5–15.5)
WBC: 6.4 10*3/uL (ref 4.0–10.5)
nRBC: 0.5 % — ABNORMAL HIGH (ref 0.0–0.2)

## 2021-01-10 LAB — GLUCOSE, CAPILLARY
Glucose-Capillary: 106 mg/dL — ABNORMAL HIGH (ref 70–99)
Glucose-Capillary: 112 mg/dL — ABNORMAL HIGH (ref 70–99)
Glucose-Capillary: 113 mg/dL — ABNORMAL HIGH (ref 70–99)
Glucose-Capillary: 122 mg/dL — ABNORMAL HIGH (ref 70–99)
Glucose-Capillary: 128 mg/dL — ABNORMAL HIGH (ref 70–99)
Glucose-Capillary: 90 mg/dL (ref 70–99)
Glucose-Capillary: 92 mg/dL (ref 70–99)

## 2021-01-10 LAB — COMPREHENSIVE METABOLIC PANEL
ALT: 64 U/L — ABNORMAL HIGH (ref 0–44)
AST: 81 U/L — ABNORMAL HIGH (ref 15–41)
Albumin: 2.2 g/dL — ABNORMAL LOW (ref 3.5–5.0)
Alkaline Phosphatase: 166 U/L — ABNORMAL HIGH (ref 38–126)
Anion gap: 9 (ref 5–15)
BUN: 16 mg/dL (ref 8–23)
CO2: 22 mmol/L (ref 22–32)
Calcium: 8.3 mg/dL — ABNORMAL LOW (ref 8.9–10.3)
Chloride: 106 mmol/L (ref 98–111)
Creatinine, Ser: 0.71 mg/dL (ref 0.44–1.00)
GFR, Estimated: >60 mL/min (ref >60)
Glucose, Bld: 119 mg/dL — ABNORMAL HIGH (ref 70–99)
Potassium: 4.7 mmol/L (ref 3.5–5.1)
Sodium: 137 mmol/L (ref 135–145)
Total Bilirubin: 0.7 mg/dL (ref 0.3–1.2)
Total Protein: 6.6 g/dL (ref 6.5–8.1)

## 2021-01-10 SURGERY — ESOPHAGOGASTRODUODENOSCOPY (EGD) WITH PROPOFOL
Anesthesia: Monitor Anesthesia Care

## 2021-01-10 MED ORDER — PROPOFOL 10 MG/ML IV BOLUS
INTRAVENOUS | Status: DC | PRN
  Administered 2021-01-10: 20 mg via INTRAVENOUS

## 2021-01-10 MED ORDER — LEVOTHYROXINE SODIUM 75 MCG PO TABS
150.0000 ug | ORAL_TABLET | Freq: Every day | ORAL | Status: DC
  Administered 2021-01-11 – 2021-01-12 (×2): 150 ug
  Filled 2021-01-10 (×2): qty 2

## 2021-01-10 MED ORDER — PANTOPRAZOLE SODIUM 40 MG PO PACK
40.0000 mg | PACK | Freq: Two times a day (BID) | ORAL | Status: DC
  Administered 2021-01-10 – 2021-01-12 (×4): 40 mg
  Filled 2021-01-10 (×6): qty 20

## 2021-01-10 MED ORDER — THIAMINE HCL 100 MG PO TABS
200.0000 mg | ORAL_TABLET | Freq: Two times a day (BID) | ORAL | Status: DC
  Administered 2021-01-10 – 2021-01-12 (×5): 200 mg
  Filled 2021-01-10 (×5): qty 2

## 2021-01-10 MED ORDER — PROPOFOL 500 MG/50ML IV EMUL
INTRAVENOUS | Status: DC | PRN
  Administered 2021-01-10: 100 ug/kg/min via INTRAVENOUS

## 2021-01-10 MED ORDER — PROSOURCE TF PO LIQD
45.0000 mL | Freq: Every day | ORAL | Status: AC
  Administered 2021-01-10 – 2021-01-12 (×3): 45 mL
  Filled 2021-01-10 (×3): qty 45

## 2021-01-10 MED ORDER — LEVOTHYROXINE SODIUM 25 MCG/ML PO SOLN
25.0000 ug | Freq: Once | ORAL | Status: DC
  Filled 2021-01-10: qty 1

## 2021-01-10 MED ORDER — LIDOCAINE 2% (20 MG/ML) 5 ML SYRINGE
INTRAMUSCULAR | Status: DC | PRN
  Administered 2021-01-10 (×2): 40 mg via INTRAVENOUS

## 2021-01-10 MED ORDER — LACTULOSE 10 GM/15ML PO SOLN
30.0000 g | Freq: Three times a day (TID) | ORAL | Status: DC
  Administered 2021-01-10 – 2021-01-11 (×3): 30 g
  Filled 2021-01-10 (×3): qty 45

## 2021-01-10 MED ORDER — OSMOLITE 1.2 CAL PO LIQD
1000.0000 mL | ORAL | Status: DC
  Administered 2021-01-10 – 2021-01-12 (×3): 1000 mL
  Filled 2021-01-10 (×4): qty 1000

## 2021-01-10 MED ORDER — LACTATED RINGERS IV SOLN: INTRAVENOUS | Status: DC | PRN

## 2021-01-10 SURGICAL SUPPLY — 15 items

## 2021-01-10 NOTE — Transfer of Care (Signed)
Immediate Anesthesia Transfer of Care Note  Patient: Kaylee King  Procedure(s) Performed: ESOPHAGOGASTRODUODENOSCOPY (EGD) WITH PROPOFOL (N/A ) ESOPHAGEAL BANDING  Patient Location: PACU and Endoscopy Unit  Anesthesia Type:MAC  Level of Consciousness: patient cooperative  Airway & Oxygen Therapy: Patient Spontanous Breathing and Patient connected to nasal cannula oxygen  Post-op Assessment: Report given to RN and Post -op Vital signs reviewed and stable  Post vital signs: Reviewed and stable  Last Vitals:  Vitals Value Taken Time  BP    Temp    Pulse 76 01/10/21 1444  Resp 27 01/10/21 1444  SpO2 96 % 01/10/21 1444  Vitals shown include unvalidated device data.  Last Pain:  Vitals:   01/10/21 1345  TempSrc: Oral  PainSc: 0-No pain      Patients Stated Pain Goal: 0 (93/24/19 [id number])  Complications: No complications documented.

## 2021-01-10 NOTE — Consult Note (Signed)
Consultation Note Date: 01/10/2021   Patient Name: Kaylee King  DOB: 08/12/1941  MRN: [id number]  Age / Sex: 80 y.o., female  PCP: Nolene Ebbs, MD Referring Physician: Tawni Millers  Reason for Consultation: Establishing goals of care  HPI/Patient Profile: 80 y.o. 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  Recently hospitalized 1/20-2/14 with hemorrhagic shock and sepsis pna, variceal bleeding.- had PEG tube placed during that hospitalization.  GI consulted and EGD done today as standard survewith findings of varices- additional bands placed.  Palliative medicine consulted for goals of care.     Clinical Assessment and Goals of Care:  I have reviewed medical records including EPIC notes, labs and imaging, examined the patient and met at bedside with patient's daughter to discuss diagnosis prognosis, GOC, EOL wishes, disposition and options.  I introduced Palliative Medicine as specialized medical care for people living with serious illness. It focuses on providing relief from the symptoms and stress of a serious illness.   We discussed a brief life review of the patient. She has been known to be stubborn and value her independence.   As far as functional and nutritional status- prior to her January hospitalization she was independent and ate well. Pam shares the feeling that her decline has felt to be very fast. Since her hospitalization she has been bedbound and requires supplementation to her meals through PEG. Pam  does feel that her rehab time and efforts were limited due to her being constantly in pain at the rehab- Pam notes that any time she visited her Mom that she was always curled up in the bed- complaining of pain in her side (wonder if this was possibly related to the psoas abscess/bleed?)   We discussed her current illness and what it means in the larger context of her on-going co-morbidities.  Natural disease trajectory and expectations at EOL were discussed.  I attempted to elicit values and goals of care important to the patient. Being independent has always been important.   Pam does share worries that patient won't be able to return to her independent state.     We discussed what patient's wishes would be in the event that her heart stopped and she stopped breathing- she would not want CPR.   MOST form was introduced.   Hospice and Palliative Care services outpatient were explained and offered. Patient was referred to [facility] outpatient Palliative- but initial visit not yet completed.   Questions and concerns were addressed.  The family was encouraged to call with questions or concerns.   Copy of Hard choices book given.   Primary Decision Maker NEXT OF KIN- patient's daughterJeannene Patella and she discusses decisions with her brother  SUMMARY OF RECOMMENDATIONS -DNR -[place]- treat what is treatable for now- continue ongoing discussion based on patient's status- Pam would like for her Mom to be strong enough to get out of bed on her own- she does not feel she got adequate PT at [facility] due to the fact she was in severe pain most of the time she was there -F/U meeting on Friday with patient's daughter to complete MOST form- she requested to review form and speak with her brother- will try and have her brother on conference call at that meeting as well    Code Status/Advance Care Planning:  DNR   Prognosis:    Unable to determine  Discharge Planning: [place]  for rehab with Palliative care service follow-up  Primary Diagnoses: Present on Admission: . Sepsis ([place]) . Acute lower UTI . Normocytic anemia . Acute respiratory failure with hypoxia and hypercapnia (HCC) . Hypothyroid . Acute metabolic encephalopathy . GI bleed . Hypotension   I have reviewed the medical record, interviewed the patient and family, and examined the patient. The following aspects are pertinent.  Past Medical History:  Diagnosis Date  . Cirrhosis ([place])   . Polymicrobial bacterial infection 01/08/2021  . Thyroid disease    hypothyroid    Social History   Socioeconomic History  . Marital status: Legally Separated    Spouse name: Not on file  . Number of children: Not on file  . Years of education: Not on file  . Highest education level: Not on file  Occupational History  . Not on file  Tobacco Use  . Smoking status: Former Smoker    Quit date: 12/31/1981    Years since quitting: 39.0  . Smokeless tobacco: Never Used  Substance and Sexual Activity  . Alcohol use: Never  . Drug use: Never  . Sexual activity: Not on file  Other Topics Concern  . Not on file  Social History Narrative  . Not on file   Social Determinants of Health   Financial Resource Strain: Not on file  Food Insecurity: Not on file  Transportation Needs: Not on file  Physical Activity: Not on file  Stress: Not on file  Social Connections: Not on file   Scheduled Meds: . [START ON 01/11/2021] enoxaparin (LOVENOX) injection  40 mg Subcutaneous Q24H  . feeding supplement (PROSource TF)  45 mL Per Tube Daily  . free water  200 mL Per Tube Q6H  . lactulose  30 g Per Tube TID  . [START ON 01/11/2021] levothyroxine  150 mcg Per Tube QAC breakfast  . levothyroxine  25 mcg Per Tube Once  . multivitamin with minerals  1 tablet Per Tube Daily  . nystatin ointment  1 application Topical BID  . pantoprazole sodium  40 mg Per Tube BID  . rifaximin  550 mg Per Tube BID  . thiamine  200 mg  Per Tube BID   Continuous Infusions: . sodium chloride Stopped (01/08/21 2057)  . feeding supplement (OSMOLITE 1.2 CAL)     PRN Meds:.sodium chloride, acetaminophen, albuterol, morphine Medications Prior to Admission:  Prior to Admission medications   Medication Sig Start Date End Date Taking? Authorizing Provider  acetaminophen (TYLENOL) 325 MG tablet Place 2 tablets (650 mg total) into feeding tube every 6 (six) hours as needed for headache, fever or mild pain. 12/19/20  Yes Danford, Suann Larry, MD  amLODipine (NORVASC) 5 MG tablet Place 1 tablet (5 mg total) into feeding tube daily. 12/20/20  Yes Myrene Buddy  P, MD  lactulose (CHRONULAC) 10 GM/15ML solution Place 30 mLs (20 g total) into feeding tube 2 (two) times daily. 12/19/20  Yes Danford, Suann Larry, MD  levothyroxine (SYNTHROID) 125 MCG tablet Place 125 mcg into feeding tube daily before breakfast.   Yes [provider]  morphine 20 MG/5ML solution Take 2.5 mLs (10 mg total) by mouth every 4 (four) hours as needed for pain. Patient taking differently: Place 10 mg into feeding tube every 4 (four) hours as needed for pain. 12/19/20  Yes Danford, Suann Larry, MD  nystatin ointment (MYCOSTATIN) Apply 1 application topically in the morning and at bedtime. Apply to affected area(s) on groin   Yes [provider]  pantoprazole (PROTONIX) 40 MG tablet Take 40 mg by mouth daily. Via tube   Yes [provider]  rifaximin (XIFAXAN) 550 MG TABS tablet Place 1 tablet (550 mg total) into feeding tube 2 (two) times daily. 12/19/20  Yes Danford, Suann Larry, MD  spironolactone (ALDACTONE) 25 MG tablet Place 1 tablet (25 mg total) into feeding tube daily. 12/20/20  Yes Danford, Suann Larry, MD  thiamine 100 MG tablet Place 1 tablet (100 mg total) into feeding tube daily. 12/19/20  Yes Danford, Suann Larry, MD  Water For Irrigation, Sterile (FREE WATER) SOLN Place 200 mLs into feeding tube every 6 (six) hours.  12/19/20  Yes Danford, Suann Larry, MD   No Known Allergies Review of Systems  Unable to perform ROS: Acuity of condition    Physical Exam Vitals and nursing note reviewed.  Constitutional:      Appearance: She is ill-appearing.     Comments: Lethargic due to recent sedation for procedure  HENT:     Mouth/Throat:     Mouth: Mucous membranes are dry.  Cardiovascular:     Pulses: Normal pulses.  Pulmonary:     Effort: Pulmonary effort is normal.  Skin:    General: Skin is warm and dry.     Coloration: Skin is not jaundiced.  Neurological:     Comments: Oriented to person and place     Vital Signs: BP 127/60 (BP Location: Left Arm)   Pulse 68   Temp 98.1 F (36.7 C) (Oral)   Resp 15   Ht '5\' 4"'  (1.626 m)   Wt 69.7 kg   SpO2 98%   BMI 26.38 kg/m  Pain Scale: 0-10   Pain Score: 0-No pain   SpO2: SpO2: 98 % O2 Device:SpO2: 98 % O2 Flow Rate: .O2 Flow Rate (L/min): 2 L/min  IO: Intake/output summary:   Intake/Output Summary (Last 24 hours) at 01/10/2021 1558 Last data filed at 01/10/2021 1440 Gross per 24 hour  Intake 350 ml  Output 600 ml  Net -250 ml    LBM: Last BM Date: 01/09/21 Baseline Weight: Weight: 69.9 kg Most recent weight: Weight: 69.7 kg     Palliative Assessment/Data: PPS: 30%     Thank you for this consult. Palliative medicine will continue to follow and assist as needed.   Time In: 1501  Time Out: 1622 Time Total: 72 mins Greater than 50%  of this time was spent counseling and coordinating care related to the above assessment and plan.  Signed by: Mariana Kaufman, AGNP-C Palliative Medicine    Please contact Palliative Medicine Team phone at [phone] for questions and concerns.  For individual provider: See Shea Evans

## 2021-01-10 NOTE — TOC Initial Note (Signed)
Transition of Care [facility]) - Initial/Assessment Note    Patient Details  Name: Marena Witts MRN: [id number] Date of Birth: 1941-01-04  [facility] CM/SW Contact:    Lynett Grimes Phone Number: 01/10/2021, 3:20 PM  Clinical Narrative:                 CSW started authorization for pt, reference number [id number]. Doctor has put in orders for PT and OT, CSW will send clinicals after pt is seen.     Patient Goals and CMS Choice        Expected Discharge Plan and Services                                                Prior Living Arrangements/Services                       Activities of Daily Living Home Assistive Devices/Equipment: Other (Comment) (fronm SNF) ADL Screening (condition at time of admission) Patient's cognitive ability adequate to safely complete daily activities?: No Is the patient deaf or have difficulty hearing?: No Does the patient have difficulty seeing, even when wearing glasses/contacts?: No Does the patient have difficulty concentrating, remembering, or making decisions?: Yes Patient able to express need for assistance with ADLs?: Yes Does the patient have difficulty dressing or bathing?: Yes Independently performs ADLs?: No Communication: Independent Dressing (OT): Dependent Is this a change from baseline?: Pre-admission baseline Grooming: Needs assistance Is this a change from baseline?: Pre-admission baseline Feeding: Dependent Is this a change from baseline?: Pre-admission baseline Bathing: Dependent Is this a change from baseline?: Pre-admission baseline Toileting: Dependent Is this a change from baseline?: Pre-admission baseline In/Out Bed: Dependent Is this a change from baseline?: Pre-admission baseline Walks in Home: Dependent Is this a change from baseline?: Pre-admission baseline Does the patient have difficulty walking or climbing stairs?: Yes Weakness of Legs: Both Weakness of Arms/Hands:  None  Permission Sought/Granted                  Emotional Assessment              Admission diagnosis:  Sepsis (HCC) [A41.9] Altered mental status, unspecified altered mental status type [R41.82] Patient Active Problem List   Diagnosis Date Noted  . Altered mental status   . Cirrhosis (HCC)   . Polymicrobial bacterial infection 01/08/2021  . Sepsis (HCC) 01/06/2021  . Acute lower UTI 01/06/2021  . Normocytic anemia 01/06/2021  . Dysphagia 01/06/2021  . PEG (percutaneous endoscopic gastrostomy) status (HCC) 01/06/2021  . Hypotension 01/06/2021  . Acute respiratory failure with hypoxia and hypercapnia (HCC)   . Respiratory failure (HCC)   . Pressure injury of skin 11/27/2020  . Acute metabolic encephalopathy 11/23/2020  . GI bleed   . 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 Portal hypertension (HCC)   . Acute gastric ulcer without hemorrhage or perforation   . Hypothyroid 01/01/2012  . HTN (hypertension) 01/01/2012   PCP:  Fleet Contras, MD Pharmacy:   [facility] [address] ([place]), [place] - [address] [place] ([place]) [place] [address] Phone: [phone] Fax: [phone]     Social Determinants of Health (SDOH) Interventions    Readmission Risk Interventions No flowsheet data found.

## 2021-01-10 NOTE — Anesthesia Preprocedure Evaluation (Addendum)
Anesthesia Evaluation  Patient identified by MRN, date of birth, ID band Patient awake    Reviewed: Allergy & Precautions, NPO status , Patient's Chart, lab work & pertinent test results  Airway Mallampati: II  TM Distance: >3 FB Neck ROM: Full    Dental  (+) Edentulous Upper, Edentulous Lower   Pulmonary neg pulmonary ROS, former smoker,    Pulmonary exam normal        Cardiovascular hypertension,  Rhythm:Regular Rate:Normal     Neuro/Psych negative neurological ROS  negative psych ROS   GI/Hepatic PUD, (+) Cirrhosis   Esophageal Varices    , NASH Variceal bleeds with hematemesis history   Endo/Other  Hypothyroidism   Renal/GU   negative genitourinary   Musculoskeletal negative musculoskeletal ROS (+)   Abdominal (+)  Abdomen: soft. Bowel sounds: normal.  Peds  Hematology  (+) anemia ,   Anesthesia Other Findings   Reproductive/Obstetrics                            Anesthesia Physical Anesthesia Plan  ASA: III  Anesthesia Plan: MAC   Post-op Pain Management:    Induction: Intravenous  PONV Risk Score and Plan: 2 and Propofol infusion  Airway Management Planned: Simple Face Mask, Natural Airway and Nasal Cannula  Additional Equipment: None  Intra-op Plan:   Post-operative Plan:   Informed Consent: I have reviewed the patients History and Physical, chart, labs and discussed the procedure including the risks, benefits and alternatives for the proposed anesthesia with the patient or authorized representative who has indicated his/her understanding and acceptance.     Dental advisory given  Plan Discussed with: CRNA  Anesthesia Plan Comments: (Lab Results      Component                Value               Date                      WBC                      6.4                 01/10/2021                HGB                      8.3 (L)             01/10/2021                 HCT                      28.0 (L)            01/10/2021                MCV                      95.9                01/10/2021                PLT                      352  01/10/2021           Lab Results      Component                Value               Date                      NA                       137                 01/10/2021                K                        4.7                 01/10/2021                CO2                      22                  01/10/2021                GLUCOSE                  119 (H)             01/10/2021                BUN                      16                  01/10/2021                CREATININE               0.71                01/10/2021                CALCIUM                  8.3 (L)             01/10/2021                GFRNONAA                 >60                 01/10/2021          )       Anesthesia Quick Evaluation

## 2021-01-10 NOTE — Progress Notes (Signed)
Subjective: No new complaints   Antibiotics:  Anti-infectives (From admission, onward)   Start     Dose/Rate Route Frequency Ordered Stop   01/08/21 0500  vancomycin (VANCOREADY) IVPB 1250 mg/250 mL  Status:  Discontinued        1,250 mg 166.7 mL/hr over 90 Minutes Intravenous Every 48 hours 01/06/21 1051 01/06/21 2053   01/07/21 2200  ceFEPIme (MAXIPIME) 2 g in sodium chloride 0.9 % 100 mL IVPB  Status:  Discontinued        2 g 200 mL/hr over 30 Minutes Intravenous Every 12 hours 01/07/21 1105 01/09/21 0850   01/06/21 2300  cefTRIAXone (ROCEPHIN) 2 g in sodium chloride 0.9 % 100 mL IVPB  Status:  Discontinued        2 g 200 mL/hr over 30 Minutes Intravenous Every 24 hours 01/06/21 2053 01/07/21 1059   01/06/21 2145  DAPTOmycin (CUBICIN) 560 mg in sodium chloride 0.9 % IVPB  Status:  Discontinued        560 mg 222.4 mL/hr over 30 Minutes Intravenous Daily 01/06/21 2053 01/09/21 0850   01/06/21 1400  piperacillin-tazobactam (ZOSYN) IVPB 3.375 g  Status:  Discontinued       "Followed by" Linked Group Details   3.375 g 12.5 mL/hr over 240 Minutes Intravenous Every 8 hours 01/06/21 0836 01/06/21 2053   01/06/21 1030  rifaximin (XIFAXAN) tablet 550 mg        550 mg Per Tube 2 times daily 01/06/21 0910     01/06/21 0845  piperacillin-tazobactam (ZOSYN) IVPB 3.375 g       "Followed by" Linked Group Details   3.375 g 100 mL/hr over 30 Minutes Intravenous  Once 01/06/21 0836 01/06/21 1010   01/06/21 0830  piperacillin-tazobactam (ZOSYN) IVPB 3.375 g  Status:  Discontinued        3.375 g 100 mL/hr over 30 Minutes Intravenous Every 8 hours 01/06/21 0826 01/06/21 0836   01/06/21 0345  ceFEPIme (MAXIPIME) 2 g in sodium chloride 0.9 % 100 mL IVPB        2 g 200 mL/hr over 30 Minutes Intravenous  Once 01/06/21 0332 01/06/21 0435   01/06/21 0345  metroNIDAZOLE (FLAGYL) IVPB 500 mg        500 mg 100 mL/hr over 60 Minutes Intravenous  Once 01/06/21 0332 01/06/21 0753   01/06/21 0345   vancomycin (VANCOREADY) IVPB 1000 mg/200 mL  Status:  Discontinued        1,000 mg 200 mL/hr over 60 Minutes Intravenous  Once 01/06/21 0332 01/06/21 0341   01/06/21 0345  vancomycin (VANCOREADY) IVPB 1500 mg/300 mL        1,500 mg 150 mL/hr over 120 Minutes Intravenous  Once 01/06/21 0341 01/06/21 0643      Medications: Scheduled Meds: . [START ON 01/11/2021] enoxaparin (LOVENOX) injection  40 mg Subcutaneous Q24H  . free water  200 mL Per Tube Q6H  . lactulose  20 g Per Tube BID  . levothyroxine  125 mcg Per Tube QAC breakfast  . multivitamin with minerals  1 tablet Per Tube Daily  . nystatin ointment  1 application Topical BID  . pantoprazole sodium  40 mg Per Tube BID  . rifaximin  550 mg Per Tube BID  . thiamine  100 mg Per Tube Daily   Continuous Infusions: . sodium chloride Stopped (01/08/21 2057)   PRN Meds:.sodium chloride, acetaminophen, albuterol, morphine    Objective: Weight change: -0.4 kg  Intake/Output Summary (Last  24 hours) at 01/10/2021 1020 Last data filed at 01/09/2021 2130 Gross per 24 hour  Intake 1000 ml  Output 950 ml  Net 50 ml   Blood pressure 134/72, pulse 79, temperature 98.6 F (37 C), temperature source Oral, resp. rate 20, height  (1.626 m), weight 69.7 kg, SpO2 100 %. Temp:  [98.3 F (36.8 C)-99.3 F (37.4 C)] 98.6 F (37 C) (03/09 0724) Pulse Rate:  [76-80] 79 (03/09 0724) Resp:  [13-24] 20 (03/09 0724) BP: (104-134)/(57-84) 134/72 (03/09 0724) SpO2:  [92 %-100 %] 100 % (03/09 0724) Weight:  [69.7 kg] 69.7 kg (03/09 0047)  Physical Exam: Physical Exam Vitals reviewed.  HENT:     Head: Normocephalic.  Eyes:     Extraocular Movements: Extraocular movements intact.  Cardiovascular:     Rate and Rhythm: Tachycardia present.  Pulmonary:     Effort: No respiratory distress.     Breath sounds: No wheezing.  Abdominal:     General: There is no distension.     Palpations: There is no mass.  Skin:    Coloration: Skin is pale.   Neurological:     General: No focal deficit present.     Mental Status: She is alert. She is disoriented.  Psychiatric:        Speech: Speech is delayed.        Behavior: Behavior is cooperative.        Cognition and Memory: Cognition is impaired. Memory is impaired. She exhibits impaired recent memory and impaired remote memory.      CBC:    BMET Recent Labs    01/09/21 0340 01/10/21 0319  NA 135 137  K 4.9 4.7  CL 105 106  CO2 22 22  GLUCOSE 127* 119*  BUN 15 16  CREATININE 0.72 0.71  CALCIUM 8.1* 8.3*     Liver Panel  Recent Labs    01/08/21 0432 01/10/21 0319  PROT 6.7 6.6  ALBUMIN 2.1* 2.2*  AST 50* 81*  ALT 44 64*  ALKPHOS 206* 166*  BILITOT 0.7 0.7       Sedimentation Rate No results for input(s): ESRSEDRATE in the last 72 hours. C-Reactive Protein No results for input(s): CRP in the last 72 hours.  Micro Results: Recent Results (from the past 720 hour(s))  Resp Panel by RT-PCR (Flu A&B, Covid) Nasopharyngeal Swab     Status: None   Collection Time: 12/19/20  8:56 AM   Specimen: Nasopharyngeal Swab; Nasopharyngeal(NP) swabs in vial transport medium  Result Value Ref Range Status   SARS Coronavirus 2 by RT PCR NEGATIVE NEGATIVE Final    Comment: (NOTE) SARS-CoV-2 target nucleic acids are NOT DETECTED.  The SARS-CoV-2 RNA is generally detectable in upper respiratory specimens during the acute phase of infection. The lowest concentration of SARS-CoV-2 viral copies this assay can detect is 138 copies/mL. A negative result does not preclude SARS-Cov-2 infection and should not be used as the sole basis for treatment or other patient management decisions. A negative result may occur with  improper specimen collection/handling, submission of specimen other than nasopharyngeal swab, presence of viral mutation(s) within the areas targeted by this assay, and inadequate number of viral copies(<138 copies/mL). A negative result must be combined  with clinical observations, patient history, and epidemiological information. The expected result is Negative.  Fact Sheet for Patients:  BloggerCourse.com  Fact Sheet for Healthcare Providers:  SeriousBroker.it  This test is no t yet approved or cleared by the [place] and  has been authorized for detection and/or diagnosis of SARS-CoV-2 by FDA under an Emergency Use Authorization (EUA). This EUA will remain  in effect (meaning this test can be used) for the duration of the COVID-19 declaration under Section 564(b)(1) of the Act, 21 U.S.C.section 360bbb-3(b)(1), unless the authorization is terminated  or revoked sooner.       Influenza A by PCR NEGATIVE NEGATIVE Final   Influenza B by PCR NEGATIVE NEGATIVE Final    Comment: (NOTE) The Xpert Xpress SARS-CoV-2/FLU/RSV plus assay is intended as an aid in the diagnosis of influenza from Nasopharyngeal swab specimens and should not be used as a sole basis for treatment. Nasal washings and aspirates are unacceptable for Xpert Xpress SARS-CoV-2/FLU/RSV testing.  Fact Sheet for Patients: BloggerCourse.com  Fact Sheet for Healthcare Providers: SeriousBroker.it  This test is not yet approved or cleared by the [place] FDA and has been authorized for detection and/or diagnosis of SARS-CoV-2 by FDA under an Emergency Use Authorization (EUA). This EUA will remain in effect (meaning this test can be used) for the duration of the COVID-19 declaration under Section 564(b)(1) of the Act, 21 U.S.C. section 360bbb-3(b)(1), unless the authorization is terminated or revoked.  Performed at [facility], [address] [place], [place] [address]   Urine culture     Status: Abnormal   Collection Time: 01/06/21  3:32 AM   Specimen: In/Out Cath Urine  Result Value Ref Range Status   Specimen Description IN/OUT CATH URINE   Final   Special Requests   Final    NONE Performed at [facility], [address], [place], [place] [address]    Culture 20,000 COLONIES/mL PSEUDOMONAS AERUGINOSA (A)  Final   Report Status 01/08/2021 FINAL  Final   Organism ID, Bacteria PSEUDOMONAS AERUGINOSA (A)  Final      Susceptibility   Pseudomonas aeruginosa - MIC*    CEFTAZIDIME >=64 RESISTANT Resistant     CIPROFLOXACIN <=0.25 SENSITIVE Sensitive     GENTAMICIN 2 SENSITIVE Sensitive     IMIPENEM 2 SENSITIVE Sensitive     * 20,000 COLONIES/mL PSEUDOMONAS AERUGINOSA  Blood Culture (routine x 2)     Status: Abnormal (Preliminary result)   Collection Time: 01/06/21  3:50 AM   Specimen: BLOOD  Result Value Ref Range Status   Specimen Description BLOOD SITE NOT SPECIFIED  Final   Special Requests   Final    BOTTLES DRAWN AEROBIC AND ANAEROBIC Blood Culture results may not be optimal due to an inadequate volume of blood received in culture bottles   Culture  Setup Time   Final    GRAM POSITIVE COCCI IN CHAINS IN PAIRS GRAM NEGATIVE RODS ANAEROBIC BOTTLE ONLY CRITICAL RESULT CALLED TO, READ BACK BY AND VERIFIED WITH: Malva Cogan PHARMD  01/06/21 EB    Culture (A)  Final    PROTEUS MIRABILIS ENTEROCOCCUS FAECALIS CULTURE REINCUBATED FOR BETTER GROWTH    Report Status PENDING  Incomplete   Organism ID, Bacteria PROTEUS MIRABILIS  Final      Susceptibility   Proteus mirabilis - MIC*    AMPICILLIN <=2 SENSITIVE Sensitive     CEFAZOLIN <=4 SENSITIVE Sensitive     CEFEPIME <=0.12 SENSITIVE Sensitive     CEFTAZIDIME <=1 SENSITIVE Sensitive     CEFTRIAXONE <=0.25 SENSITIVE Sensitive     CIPROFLOXACIN <=0.25 SENSITIVE Sensitive     GENTAMICIN <=1 SENSITIVE Sensitive     IMIPENEM 2 SENSITIVE Sensitive     TRIMETH/SULFA <=20 SENSITIVE Sensitive  AMPICILLIN/SULBACTAM <=2 SENSITIVE Sensitive     PIP/TAZO Value in next row Sensitive      <=4 SENSITIVEPerformed at [facility], [address] [place], [place]  [id number]    * PROTEUS MIRABILIS  Blood Culture ID Panel (Reflexed)     Status: Abnormal   Collection Time: 01/06/21  3:50 AM  Result Value Ref Range Status   Enterococcus faecalis DETECTED (A) NOT DETECTED Final    Comment: CRITICAL RESULT CALLED TO, READ BACK BY AND VERIFIED WITH: KIM HURTH PHARMD @1910  01/06/21 EB    Enterococcus Faecium DETECTED (A) NOT DETECTED Final    Comment: CRITICAL RESULT CALLED TO, READ BACK BY AND VERIFIED WITH: KIM HURTH PHARMD @1910  01/06/21 EB    Listeria monocytogenes NOT DETECTED NOT DETECTED Final   Staphylococcus species NOT DETECTED NOT DETECTED Final   Staphylococcus aureus (BCID) NOT DETECTED NOT DETECTED Final   Staphylococcus epidermidis NOT DETECTED NOT DETECTED Final   Staphylococcus lugdunensis NOT DETECTED NOT DETECTED Final   Streptococcus species DETECTED (A) NOT DETECTED Final    Comment: CRITICAL RESULT CALLED TO, READ BACK BY AND VERIFIED WITH: KIM HURTH PHARMD @1910  01/06/21 EB    Streptococcus agalactiae DETECTED (A) NOT DETECTED Final    Comment: CRITICAL RESULT CALLED TO, READ BACK BY AND VERIFIED WITH: Malva CoganKIM HURTH PHARMD @1910  01/06/21 EB    Streptococcus pneumoniae NOT DETECTED NOT DETECTED Final   Streptococcus pyogenes NOT DETECTED NOT DETECTED Final   A.calcoaceticus-baumannii NOT DETECTED NOT DETECTED Final   Bacteroides fragilis NOT DETECTED NOT DETECTED Final   Enterobacterales DETECTED (A) NOT DETECTED Final    Comment: Enterobacterales represent a large order of gram negative bacteria, not a single organism. CRITICAL RESULT CALLED TO, READ BACK BY AND VERIFIED WITH: Malva CoganKIM HURTH PHARMD @1910  01/06/21 EB    Enterobacter cloacae complex NOT DETECTED NOT DETECTED Final   Escherichia coli NOT DETECTED NOT DETECTED Final   Klebsiella aerogenes NOT DETECTED NOT DETECTED Final   Klebsiella oxytoca NOT DETECTED NOT DETECTED Final   Klebsiella pneumoniae NOT DETECTED NOT DETECTED Final   Proteus species DETECTED (A) NOT DETECTED Final     Comment: CRITICAL RESULT CALLED TO, READ BACK BY AND VERIFIED WITH: Malva CoganKIM HURTH PHARMD @1910  01/06/21 EB    Salmonella species NOT DETECTED NOT DETECTED Final   Serratia marcescens NOT DETECTED NOT DETECTED Final   Haemophilus influenzae NOT DETECTED NOT DETECTED Final   Neisseria meningitidis NOT DETECTED NOT DETECTED Final   Pseudomonas aeruginosa NOT DETECTED NOT DETECTED Final   Stenotrophomonas maltophilia NOT DETECTED NOT DETECTED Final   Candida albicans NOT DETECTED NOT DETECTED Final   Candida auris NOT DETECTED NOT DETECTED Final   Candida glabrata NOT DETECTED NOT DETECTED Final   Candida krusei NOT DETECTED NOT DETECTED Final   Candida parapsilosis NOT DETECTED NOT DETECTED Final   Candida tropicalis NOT DETECTED NOT DETECTED Final   Cryptococcus neoformans/gattii NOT DETECTED NOT DETECTED Final   CTX-M ESBL NOT DETECTED NOT DETECTED Final   Carbapenem resistance IMP NOT DETECTED NOT DETECTED Final   Carbapenem resistance KPC NOT DETECTED NOT DETECTED Final   Carbapenem resistance NDM NOT DETECTED NOT DETECTED Final   Carbapenem resist OXA 48 LIKE NOT DETECTED NOT DETECTED Final   Vancomycin resistance DETECTED (A) NOT DETECTED Final    Comment: CRITICAL RESULT CALLED TO, READ BACK BY AND VERIFIED WITH: KIM HURTH PHARMD @1910  01/06/21 EB    Carbapenem resistance VIM NOT DETECTED NOT DETECTED Final    Comment: Performed at  [facility], [address] [place] [place], [place] [address]  Blood Culture (routine x 2)     Status: None (Preliminary result)   Collection Time: 01/06/21  3:52 AM   Specimen: BLOOD  Result Value Ref Range Status   Specimen Description BLOOD SITE NOT SPECIFIED  Final   Special Requests   Final    BOTTLES DRAWN AEROBIC AND ANAEROBIC Blood Culture adequate volume   Culture   Final    NO GROWTH 3 DAYS Performed at [facility], [address], [place], [place] [address]    Report Status PENDING  Incomplete  Resp Panel by RT-PCR (Flu A&B, Covid)  Nasopharyngeal Swab     Status: None   Collection Time: 01/06/21  4:37 AM   Specimen: Nasopharyngeal Swab; Nasopharyngeal(NP) swabs in vial transport medium  Result Value Ref Range Status   SARS Coronavirus 2 by RT PCR NEGATIVE NEGATIVE Final    Comment: (NOTE) SARS-CoV-2 target nucleic acids are NOT DETECTED.  The SARS-CoV-2 RNA is generally detectable in upper respiratory specimens during the acute phase of infection. The lowest concentration of SARS-CoV-2 viral copies this assay can detect is 138 copies/mL. A negative result does not preclude SARS-Cov-2 infection and should not be used as the sole basis for treatment or other patient management decisions. A negative result may occur with  improper specimen collection/handling, submission of specimen other than nasopharyngeal swab, presence of viral mutation(s) within the areas targeted by this assay, and inadequate number of viral copies(<138 copies/mL). A negative result must be combined with clinical observations, patient history, and epidemiological information. The expected result is Negative.  Fact Sheet for Patients:  BloggerCourse.com  Fact Sheet for Healthcare Providers:  SeriousBroker.it  This test is no t yet approved or cleared by the [place] FDA and  has been authorized for detection and/or diagnosis of SARS-CoV-2 by FDA under an Emergency Use Authorization (EUA). This EUA will remain  in effect (meaning this test can be used) for the duration of the COVID-19 declaration under Section 564(b)(1) of the Act, 21 U.S.C.section 360bbb-3(b)(1), unless the authorization is terminated  or revoked sooner.       Influenza A by PCR NEGATIVE NEGATIVE Final   Influenza B by PCR NEGATIVE NEGATIVE Final    Comment: (NOTE) The Xpert Xpress SARS-CoV-2/FLU/RSV plus assay is intended as an aid in the diagnosis of influenza from Nasopharyngeal swab specimens and should not be  used as a sole basis for treatment. Nasal washings and aspirates are unacceptable for Xpert Xpress SARS-CoV-2/FLU/RSV testing.  Fact Sheet for Patients: BloggerCourse.com  Fact Sheet for Healthcare Providers: SeriousBroker.it  This test is not yet approved or cleared by the [place] FDA and has been authorized for detection and/or diagnosis of SARS-CoV-2 by FDA under an Emergency Use Authorization (EUA). This EUA will remain in effect (meaning this test can be used) for the duration of the COVID-19 declaration under Section 564(b)(1) of the Act, 21 U.S.C. section 360bbb-3(b)(1), unless the authorization is terminated or revoked.  Performed at [facility], [address] [place], [place] [address]   Culture, blood (routine x 2)     Status: None (Preliminary result)   Collection Time: 01/07/21 12:14 PM   Specimen: BLOOD  Result Value Ref Range Status   Specimen Description BLOOD SITE NOT SPECIFIED  Final   Special Requests   Final    BOTTLES DRAWN AEROBIC ONLY Blood Culture results may not be optimal due to an inadequate volume  of blood received in culture bottles   Culture  Setup Time   Final    AEROBIC BOTTLE ONLY GRAM POSITIVE COCCI CRITICAL RESULT CALLED TO, READ BACK BY AND VERIFIED WITH: L SEAY PHARMD 01/09/21 0344 JDW Performed at [facility], [address], [place], [place] [address]    Culture GRAM POSITIVE COCCI  Final   Report Status PENDING  Incomplete  Culture, blood (routine x 2)     Status: None (Preliminary result)   Collection Time: 01/07/21 12:14 PM   Specimen: BLOOD  Result Value Ref Range Status   Specimen Description BLOOD SITE NOT SPECIFIED  Final   Special Requests   Final    BOTTLES DRAWN AEROBIC ONLY Blood Culture results may not be optimal due to an inadequate volume of blood received in culture bottles   Culture   Final    NO GROWTH 2 DAYS Performed at [facility], [address], [place], [place] [address]    Report Status PENDING  Incomplete  Blood Culture ID Panel (Reflexed)     Status: Abnormal   Collection Time: 01/07/21 12:14 PM  Result Value Ref Range Status   Enterococcus faecalis NOT DETECTED NOT DETECTED Final   Enterococcus Faecium NOT DETECTED NOT DETECTED Final   Listeria monocytogenes NOT DETECTED NOT DETECTED Final   Staphylococcus species DETECTED (A) NOT DETECTED Final    Comment: CRITICAL RESULT CALLED TO, READ BACK BY AND VERIFIED WITH: L SEAY PHARMD 01/09/21 0344 JDW    Staphylococcus aureus (BCID) NOT DETECTED NOT DETECTED Final   Staphylococcus epidermidis DETECTED (A) NOT DETECTED Final    Comment: Methicillin (oxacillin) resistant coagulase negative staphylococcus. Possible blood culture contaminant (unless isolated from more than one blood culture draw or clinical case suggests pathogenicity). No antibiotic treatment is indicated for blood  culture contaminants. CRITICAL RESULT CALLED TO, READ BACK BY AND VERIFIED WITH: L SEAY PHARMD 01/09/21 0344 JDW    Staphylococcus lugdunensis NOT DETECTED NOT DETECTED Final   Streptococcus species NOT DETECTED NOT DETECTED Final   Streptococcus agalactiae NOT DETECTED NOT DETECTED Final   Streptococcus pneumoniae NOT DETECTED NOT DETECTED Final   Streptococcus pyogenes NOT DETECTED NOT DETECTED Final   A.calcoaceticus-baumannii NOT DETECTED NOT DETECTED Final   Bacteroides fragilis NOT DETECTED NOT DETECTED Final   Enterobacterales NOT DETECTED NOT DETECTED Final   Enterobacter cloacae complex NOT DETECTED NOT DETECTED Final   Escherichia coli NOT DETECTED NOT DETECTED Final   Klebsiella aerogenes NOT DETECTED NOT DETECTED Final   Klebsiella oxytoca NOT DETECTED NOT DETECTED Final   Klebsiella pneumoniae NOT DETECTED NOT DETECTED Final   Proteus species NOT DETECTED NOT DETECTED Final   Salmonella species NOT DETECTED NOT DETECTED Final   Serratia marcescens NOT DETECTED NOT DETECTED Final    Haemophilus influenzae NOT DETECTED NOT DETECTED Final   Neisseria meningitidis NOT DETECTED NOT DETECTED Final   Pseudomonas aeruginosa NOT DETECTED NOT DETECTED Final   Stenotrophomonas maltophilia NOT DETECTED NOT DETECTED Final   Candida albicans NOT DETECTED NOT DETECTED Final   Candida auris NOT DETECTED NOT DETECTED Final   Candida glabrata NOT DETECTED NOT DETECTED Final   Candida krusei NOT DETECTED NOT DETECTED Final   Candida parapsilosis NOT DETECTED NOT DETECTED Final   Candida tropicalis NOT DETECTED NOT DETECTED Final   Cryptococcus neoformans/gattii NOT DETECTED NOT DETECTED Final   Methicillin resistance mecA/C DETECTED (A) NOT DETECTED Final    Comment: CRITICAL RESULT CALLED TO, READ BACK BY AND VERIFIED WITH:  Tori Milks [facility] 01/09/21 0344 JDW Performed at [facility], [address] [place], [place] [address]     Studies/Results: No results found.    Assessment/Plan:  INTERVAL HISTORY:  Repeat blood cultures taken are growing a staph hominis species which is undoubtedly contaminant as well.  Principal Problem:   Polymicrobial bacterial infection Active Problems:   Hypothyroid   Acute metabolic encephalopathy   GI bleed   Acute respiratory failure with hypoxia and hypercapnia (HCC)   Sepsis (HCC)   Acute lower UTI   Normocytic anemia   Dysphagia   PEG (percutaneous endoscopic gastrostomy) status (HCC)   Hypotension   Altered mental status   Cirrhosis (HCC)    Kaylee King is a 80 y.o. female with with multiple medical problems including NASH with cirrhosis varices GI bleed was admitted with hypoxia and concern for sepsis. One of her blood cultures is yielded multiple organisms with Enterococcus faecalis and vancomycin resistant Enterococcus VCM identified by [facility] ID. She also had a Proteus mirabilis species identified which is growing in culture. The other blood culture was completely sterile. She has been on daptomycin with ceftriaxone which was  recently changed to cefepime.  Her urine culture grew yielded only 20,000 colony-forming units of Pseudomonas aeruginosa. At first glance this culture would appear to be a contaminant given that nothing grew in one side and multiple organisms grew in the other.  She does have on imaging evidence of an abnormal area in her psoas muscle which is diminished compared to imaging on the eighth of February.  It is conceivable that she has a psoas muscle abscess that is causing her bacteremia but I don't understand why it would be getting smaller despite not receiving antimicrobial therapy for it.  We have thoroughly discussed case with our team and believe that her cultures with multiple organisms in them very likely represent contamination.  We thought the prudent maneuver at present is to stop all antibiotics and observe her off antibiotics if she develops fevers and evidence of infection one can make sure that she gets repeat blood cultures with thorough cleaning of the sites prior to drawing of blood cultures.  If they are then positive I would recommend getting an MRI of the abdomen and pelvis to further elucidate this area of hematoma versus abscess.  She has remained afebrile 1 day after stopping antibiotics.  We will sign off for now please call with further questions.         LOS: 4 days   Acey Lav 01/10/2021, 10:20 AM

## 2021-01-10 NOTE — Anesthesia Procedure Notes (Signed)
Procedure Name: MAC Date/Time: 01/10/2021 2:24 PM Performed by: Janace Litten, CRNA Pre-anesthesia Checklist: Patient identified, Emergency Drugs available, Suction available and Patient being monitored Patient Re-evaluated:Patient Re-evaluated prior to induction Oxygen Delivery Method: Nasal cannula

## 2021-01-10 NOTE — NC FL2 (Signed)
[place] MEDICAID FL2 LEVEL OF CARE SCREENING TOOL     IDENTIFICATION  Patient Name: Kaylee King Birthdate: 1941/07/25 Sex: female Admission Date (Current Location): 01/06/2021  [facility] and [place] Number:  Producer, television/film/video and Address:  [facility] [place], [address], [place], [place] [address]      Provider Number: [id number]  Attending Physician Name and Address:  Coralie Keens  Relative Name and Phone Number:  Kaylee King, Delira (Daughter)   [phone] [facility] Phone)    Current Level of Care: Hospital Recommended Level of Care: Skilled Nursing Facility Prior Approval Number:    Date Approved/Denied:   PASRR Number: [id number]  Discharge Plan: SNF    Current Diagnoses: Patient Active Problem List   Diagnosis Date Noted  . Altered mental status   . Cirrhosis (HCC)   . Polymicrobial bacterial infection 01/08/2021  . Sepsis (HCC) 01/06/2021  . Acute lower UTI 01/06/2021  . Normocytic anemia 01/06/2021  . Dysphagia 01/06/2021  . PEG (percutaneous endoscopic gastrostomy) status (HCC) 01/06/2021  . Hypotension 01/06/2021  . Acute respiratory failure with hypoxia and hypercapnia (HCC)   . Respiratory failure (HCC)   . Pressure injury of skin 11/27/2020  . Acute metabolic encephalopathy 11/23/2020  . GI bleed   . 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 Portal hypertension (HCC)   . Acute gastric ulcer without hemorrhage or perforation   . Hypothyroid 01/01/2012  . HTN (hypertension) 01/01/2012    Orientation RESPIRATION BLADDER Height & Weight     Self  O2 (Nasal Cannula 3L) Incontinent,External catheter Weight: 153 lb 10.6 oz (69.7 kg) Height:  5\' 4"  (162.6 cm)  BEHAVIORAL SYMPTOMS/MOOD NEUROLOGICAL BOWEL NUTRITION STATUS      Incontinent Diet (Please see DC Summary)  AMBULATORY STATUS COMMUNICATION OF NEEDS Skin   Supervision Verbally Normal                       Personal Care Assistance Level of  Assistance  Bathing,Feeding,Dressing Bathing Assistance: Limited assistance Feeding assistance: Limited assistance Dressing Assistance: Limited assistance     Functional Limitations Info  Sight,Hearing,Speech Sight Info: Adequate Hearing Info: Adequate Speech Info: Adequate    SPECIAL CARE FACTORS FREQUENCY  PT (By licensed PT),OT (By licensed OT)     PT Frequency: 5x per week OT Frequency: 5x per week            Contractures Contractures Info: Not present    Additional Factors Info  Code Status,Allergies Code Status Info: Full Allergies Info: NKA           Current Medications (01/10/2021):  This is the current hospital active medication list Current Facility-Administered Medications  Medication Dose Route Frequency Provider Last Rate Last Admin  . [MAR Hold] 0.9 %  sodium chloride infusion   Intravenous PRN 03/12/2021, DO   Stopped at 01/08/21 2057  . [MAR Hold] acetaminophen (TYLENOL) tablet 650 mg  650 mg Per Tube Q4H PRN Mansy, 2058, MD   650 mg at 01/08/21 2242  . [MAR Hold] albuterol (PROVENTIL) (2.5 MG/3ML) 0.083% nebulizer solution 2.5 mg  2.5 mg Nebulization Q6H PRN 2243, MD      . Clydie Braun Hold] enoxaparin (LOVENOX) injection 40 mg  40 mg Subcutaneous Q24H Mitzi Hansen, PA-C      . [MAR Hold] free water 200 mL  200 mL Per Tube Q6H Smith, Rondell A, MD   200 mL at 01/10/21 0600  . [facility]  Hold] lactulose (CHRONULAC) 10 GM/15ML solution 30 g  30 g Per Tube TID Arrien, York Ram, MD      . Mitzi Hansen Hold] levothyroxine (SYNTHROID) tablet 150 mcg  150 mcg Per Tube QAC breakfast Arrien, York Ram, MD      . Mitzi Hansen Hold] levothyroxine (TIROSINT-SOL) 25 MCG/ML oral solution 25 mcg  25 mcg Per Tube Once Arrien, York Ram, MD      . Mitzi Hansen Hold] morphine 10 MG/5ML solution 10 mg  10 mg Per Tube Q4H PRN Clydie Braun, MD      . Mitzi Hansen Hold] multivitamin with minerals tablet 1 tablet  1 tablet Per Tube Daily Edsel Petrin, DO   1 tablet at  01/09/21 1010  . [MAR Hold] nystatin ointment (MYCOSTATIN) 1 application  1 application Topical BID Madelyn Flavors A, MD   1 application at 01/10/21 1020  . [MAR Hold] pantoprazole sodium (PROTONIX) 40 mg/20 mL oral suspension 40 mg  40 mg Per Tube BID Leander Rams, [facility]      . [MAR Hold] rifaximin (XIFAXAN) tablet 550 mg  550 mg Per Tube BID Madelyn Flavors A, MD   550 mg at 01/09/21 2137  . [MAR Hold] thiamine tablet 200 mg  200 mg Per Tube BID Arrien, York Ram, MD         Discharge Medications: Please see discharge summary for a list of discharge medications.  Relevant Imaging Results:  Relevant Lab Results:   Additional Information SSN [id number]  patient has received covid vaccines- no booster shot  [name] Wynn Banker, [address]

## 2021-01-10 NOTE — Op Note (Addendum)
[facility] Patient Name: Kaylee King Procedure Date : 01/10/2021 MRN: [id number] Attending MD: Rachael Feeaniel P Kasey Hansell , MD Date of Birth: 08/19/1941 CSN: [id number] Age: 80 Admit Type: Inpatient Procedure:                Upper GI endoscopy Indications:              EGD 11/2020 DR. Pyrtle during acute GI bleeding;                            Large esophageal varices (felt to be the source of                            bleeding) were banded with 6 ligating bands, GOV1                            gastric varices noted, three gastric ulcers noted.                            Still in hospital however no sign of recurrent                            bleeding. For surveillance, rebanding of esophageal                            varices and to check for gastric ulcer healing. Providers:                Rachael Feeaniel P. Ephraim Reichel, MD, Shelda Jakeshristina Griffith, RN,                            Brion AlimentShayla Proctor, Technician Referring MD:              Medicines:                Monitored Anesthesia Care Complications:            No immediate complications. Estimated blood loss:                            None. Estimated Blood Loss:     Estimated blood loss: none. Procedure:                Pre-Anesthesia Assessment:                           - Prior to the procedure, a History and Physical                            was performed, and patient medications and                            allergies were reviewed. The patient's tolerance of                            previous anesthesia was also reviewed. The risks  and benefits of the procedure and the sedation                            options and risks were discussed with the patient.                            All questions were answered, and informed consent                            was obtained. Prior Anticoagulants: The patient has                            taken no previous anticoagulant or antiplatelet                             agents. ASA Grade Assessment: IV - A patient with                            severe systemic disease that is a constant threat                            to life. After reviewing the risks and benefits,                            the patient was deemed in satisfactory condition to                            undergo the procedure.                           After obtaining informed consent, the endoscope was                            passed under direct vision. Throughout the                            procedure, the patient's blood pressure, pulse, and                            oxygen saturations were monitored continuously. The                            GIF-H190 ([id number]) Olympus gastroscope was                            introduced through the mouth, and advanced to the                            second part of duodenum. The upper GI endoscopy was                            accomplished without difficulty. The patient  tolerated the procedure well. Scope In: Scope Out: Findings:      Residual, medium sized distal esophagus varices were found without sign       of recent bleeding. Five bands were successfully placed with complete       eradication, resulting in deflation of varices. There was no bleeding       during the procedure.      GOV1 proximal gastric varices were again noted.      Mild to moderate changes of portal gastropathy were noted throughout the       stomach.      The internal G tube bumper was in good position along the body of the       stomach.      The previously noted gastric ulcers have all completely healed.      The exam was otherwise without abnormality. Impression:               - Residual, medium sized distal esophagus varices                            were found without sign of recent bleeding. Five                            bands were successfully placed with complete                            eradication, resulting in  deflation of varices.                            There was no bleeding during the procedure.                           - GOV1 proximal gastric varices were again noted.                           - Mild to moderate changes of portal gastropathy                            were noted throughout the stomach.                           - The internal G tube bumper was in good position                            along the body of the stomach.                           - The previously noted gastric ulcers have all                            completely healed. Recommendation:           - No plans for further inpatient GI testing or                            procedures.                           -  She will need follow up appt with Dr. Rhea Belton                            within several weeks of her eventual d/c from this                            acute stay.                           - Please call or page with any further questions or                            concerns. Procedure Code(s):        --- Professional ---                           [phone], Esophagogastroduodenoscopy, flexible,                            transoral; with band ligation of esophageal/gastric                            varices Diagnosis Code(s):        --- Professional ---                           I85.00, Esophageal varices without bleeding CPT copyright 2019 American Medical Association. All rights reserved. The codes documented in this report are preliminary and upon coder review may  be revised to meet current compliance requirements. Rachael Fee, MD 01/10/2021 2:48:53 PM This report has been signed electronically. Number of Addenda: 0

## 2021-01-10 NOTE — Anesthesia Postprocedure Evaluation (Signed)
Anesthesia Post Note  Patient: Whitni Pasquini  Procedure(s) Performed: ESOPHAGOGASTRODUODENOSCOPY (EGD) WITH PROPOFOL (N/A ) ESOPHAGEAL BANDING     Patient location during evaluation: PACU Anesthesia Type: MAC Level of consciousness: awake and alert Pain management: pain level controlled Vital Signs Assessment: post-procedure vital signs reviewed and stable Respiratory status: spontaneous breathing, nonlabored ventilation, respiratory function stable and patient connected to nasal cannula oxygen Cardiovascular status: stable and blood pressure returned to baseline Postop Assessment: no apparent nausea or vomiting Anesthetic complications: no   No complications documented.  Last Vitals:  Vitals:   01/10/21 1527 01/10/21 2000  BP: 127/60 129/60  Pulse: 68 73  Resp: 15 (!) 25  Temp: 36.7 C 36.7 C  SpO2: 98% 94%    Last Pain:  Vitals:   01/10/21 2000  TempSrc: Oral  PainSc:                  March Rummage Tayshaun Kroh

## 2021-01-10 NOTE — Progress Notes (Signed)
PROGRESS NOTE    Kaylee King  NWG:[id number] DOB: Jan 21, 1941 DOA: 01/06/2021 PCP: Fleet Contras, MD    Brief Narrative:  Mrs. Signer was admitted to the hospital with working diagnosis of severe sepsis secondary to urinary tract infection, complicated by intramuscular right psoas hematoma/questionable abscess and bacteremia.  80 year old female past medical history for hypothyroidism, cirrhosis due to [place], portal hypertension, esophageal and gastric varices who presented with altered mentation.  Patient unable to provide any history on admission, apparently she had been experiencing pain at the skilled nursing facility.  When her initial physical examination her oximetry was 85% on room air, she was tachypneic 910 to 25 breaths/min, blood pressure 91/47, 116/45, she had dry mucous membranes, her lungs were clear to auscultation bilaterally, heart S1-S2, present rhythmic, her abdomen was soft nontender, J-tube in place, no lower extremity edema.  She was lethargic and unable to answer any questions.  Urinalysis specific gravity 1.013, 30 protein, 21-50 white cells, 0-5 red cells.  She was placed on broad-spectrum antibiotic therapy (daptomycin, ceftriaxone, cefepime) and intravenous fluids. Her blood culture yielded multiple organisms grew Enterococcus faecalis and vancomycin-resistant Enterococcus.  Positive Proteus mirabilis.  Urine culture Pseudomonas.  01/07/21 CT chest abdomen and pelvis she was found to have a right psoas muscle hematoma questionable abscess. Interval contraction from 12/16/20.   Antibiotics were discontinued considering possible contamination of cultures.  Assessment & Plan:   Principal Problem:   Polymicrobial bacterial infection Active Problems:   Hypothyroid   Acute metabolic encephalopathy   GI bleed   Acute respiratory failure with hypoxia and hypercapnia (HCC)   Sepsis (HCC)   Acute lower UTI   Normocytic anemia   Dysphagia   PEG (percutaneous endoscopic  gastrostomy) status (HCC)   Hypotension   Altered mental status   Cirrhosis (HCC)   1. Severe sepsis due to urinary tract infection, complicated with possible right psoas hematoma and bacteremia. (endorgan failure metabolic encephalopathy and hypotension) Patient is hemodynamically stable, continue to be somnolent but able to answer simple questions and follow simple commands. Wbc is 6,4 and she has been afebrile.  Follow up blood cultures 03/06 with one bottle with no growth x 3 days, other with staph epidermidis.  Cultures from 03/05 x 2 bottles, proteus and VRE enterococcus.   Antibiotic therapy has been stopped after about 4 days.  Continue to follow up cell count, cultures and temperature curve.  Encourage mobility with PT and OT.    2. Acute hypoxemic respiratory failure due to atelectasis. Her oxygenation is 100% on 3 L/min.  Continue airway clearing techniques and oxymetry monitoring.  CT angiography with no pulmonary embolism.   3. NASH with cirrhosis, portal hypertension, with gastric and esophageal varices. Patient is somnolent but not asterixis. No signs of active bleeding.  Hgb is 8,3 and hct at 28.0.  Continue rifaximin and increase dose of lactulose 30 g tid.  Continue neuro checks.  GI will perform a repeat EGD today.   4. Hypothyroid. Continue with levothyroxine. TSH 15.1 from 20,6 (02/22). Clinically lethargy can be due to hypothyroidism, will increase dose to 150 mcg for now and continue close followup.   5. Swallow dysfunction with moderate protein calorie malnutrition. Patient on dysphagia diet, PEG tube in place.  Continue with nutritional supplements.  Increase thiamine to 200 mg bid.  6, Abdominal aortic aneurysm. 3,2 cm diameter. Continue close follow up as outpatient.   Patient continue to be at high risk for worsening encephalopathy   Status is: Inpatient  Remains  inpatient appropriate because:IV treatments appropriate due to intensity of illness or  inability to take PO and Inpatient level of care appropriate due to severity of illness   Dispo: The patient is from: SNF              Anticipated d/c is to: SNF              Patient currently is not medically stable to d/c.   Difficult to place patient No   DVT prophylaxis: Enoxaparin   Code Status:   full  Family Communication:  No family at the bedside      Nutrition Status: Nutrition Problem: Inadequate oral intake Etiology: dysphagia Signs/Symptoms: NPO status Interventions: Tube feeding      Consultants:   ID       Subjective: Patient is somnolent and lethargic, easy to arouse and able to respond to simple questions and follow simple commands, very slow response.   Objective: Vitals:   01/10/21 0400 01/10/21 0500 01/10/21 0600 01/10/21 0724  BP:    134/72  Pulse:    79  Resp: 17 19 (!) 21 20  Temp:    98.6 F (37 C)  TempSrc:    Oral  SpO2:    100%  Weight:      Height:        Intake/Output Summary (Last 24 hours) at 01/10/2021 1039 Last data filed at 01/10/2021 1016 Gross per 24 hour  Intake 1050 ml  Output 950 ml  Net 100 ml   Filed Weights   01/09/21 0007 01/09/21 0338 01/10/21 0047  Weight: 70.1 kg 69.9 kg 69.7 kg    Examination:   General: deconditioned and ill looking appearing.  Neurology: somnolent and generalized weakness 4.5 all 4 extremities.  E ENT: positive pallor, no icterus, oral mucosa moist Cardiovascular: No JVD. S1-S2 present, rhythmic, no gallops, rubs, or murmurs. No lower extremity edema. Pulmonary: positive breath sounds bilaterally, Gastrointestinal. Abdomen mild distended, peg tube in place, non tender Skin. No rashes. No ecchymosis  Musculoskeletal: no joint deformities     Data Reviewed: I have personally reviewed following labs and imaging studies  CBC: Recent Labs  Lab 01/06/21 0332 01/06/21 1211 01/06/21 2229 01/07/21 0300 01/08/21 0432 01/09/21 0340 01/10/21 0319  WBC 16.7*  --   --  8.2 6.9 6.0 6.4   NEUTROABS 15.7*  --   --   --   --   --   --   HGB 7.7*   < > 7.7* 8.4* 8.1* 8.0* 8.3*  HCT 26.5*   < > 24.4* 27.8* 27.4* 26.9* 28.0*  MCV 97.1  --   --  93.9 95.5 94.7 95.9  PLT 498*  --   --  409* 422* 395 352   < > = values in this interval not displayed.   Basic Metabolic Panel: Recent Labs  Lab 01/06/21 0332 01/07/21 0300 01/08/21 0432 01/09/21 0340 01/10/21 0319  NA 133* 137 134* 135 137  K 5.2* 5.1 4.2 4.9 4.7  CL 101 106 106 105 106  CO2 22 21* 18* 22 22  GLUCOSE 191* 80 126* 127* 119*  BUN 26* 21 18 15 16   CREATININE 0.97 0.95 0.87 0.72 0.71  CALCIUM 8.4* 8.5* 8.2* 8.1* 8.3*   GFR: Estimated Creatinine Clearance: 52 mL/min (by C-G formula based on SCr of 0.71 mg/dL). Liver Function Tests: Recent Labs  Lab 01/06/21 0332 01/07/21 0300 01/08/21 0432 01/10/21 0319  AST 63* 52* 50* 81*  ALT 57* 42 44  64*  ALKPHOS 289* 211* 206* 166*  BILITOT 1.1 1.0 0.7 0.7  PROT 7.2 6.5 6.7 6.6  ALBUMIN 2.3* 2.1* 2.1* 2.2*   No results for input(s): LIPASE, AMYLASE in the last 168 hours. Recent Labs  Lab 01/06/21 0436 01/08/21 0432  AMMONIA 29 31   Coagulation Profile: Recent Labs  Lab 01/06/21 0332 01/06/21 0503  INR 1.1 1.1   Cardiac Enzymes: Recent Labs  Lab 01/07/21 0300  CKTOTAL 34*   BNP (last 3 results) No results for input(s): PROBNP in the last 8760 hours. HbA1C: No results for input(s): HGBA1C in the last 72 hours. CBG: Recent Labs  Lab 01/09/21 2121 01/09/21 2137 01/10/21 0024 01/10/21 0459 01/10/21 0726  GLUCAP 109* 120* 113* 128* 112*   Lipid Profile: No results for input(s): CHOL, HDL, LDLCALC, TRIG, CHOLHDL, LDLDIRECT in the last 72 hours. Thyroid Function Tests: No results for input(s): TSH, T4TOTAL, FREET4, T3FREE, THYROIDAB in the last 72 hours. Anemia Panel: No results for input(s): VITAMINB12, FOLATE, FERRITIN, TIBC, IRON, RETICCTPCT in the last 72 hours.    Radiology Studies: I have reviewed all of the imaging during this  hospital visit personally     Scheduled Meds: . [START ON 01/11/2021] enoxaparin (LOVENOX) injection  40 mg Subcutaneous Q24H  . free water  200 mL Per Tube Q6H  . lactulose  20 g Per Tube BID  . levothyroxine  125 mcg Per Tube QAC breakfast  . multivitamin with minerals  1 tablet Per Tube Daily  . nystatin ointment  1 application Topical BID  . pantoprazole sodium  40 mg Per Tube BID  . rifaximin  550 mg Per Tube BID  . thiamine  100 mg Per Tube Daily   Continuous Infusions: . sodium chloride Stopped (01/08/21 2057)     LOS: 4 days        Valisha Heslin Annett Gula, MD

## 2021-01-11 LAB — CULTURE, BLOOD (ROUTINE X 2)
Culture: NO GROWTH
Special Requests: ADEQUATE

## 2021-01-11 LAB — BASIC METABOLIC PANEL
Anion gap: 6 (ref 5–15)
BUN: 17 mg/dL (ref 8–23)
CO2: 25 mmol/L (ref 22–32)
Calcium: 8.6 mg/dL — ABNORMAL LOW (ref 8.9–10.3)
Chloride: 105 mmol/L (ref 98–111)
Creatinine, Ser: 0.67 mg/dL (ref 0.44–1.00)
GFR, Estimated: >60 mL/min (ref >60)
Glucose, Bld: 118 mg/dL — ABNORMAL HIGH (ref 70–99)
Potassium: 4.8 mmol/L (ref 3.5–5.1)
Sodium: 136 mmol/L (ref 135–145)

## 2021-01-11 LAB — CBC WITH DIFFERENTIAL/PLATELET
Abs Immature Granulocytes: 0.08 10*3/uL — ABNORMAL HIGH (ref 0.00–0.07)
Basophils Absolute: 0 10*3/uL (ref 0.0–0.1)
Basophils Relative: 0 %
Eosinophils Absolute: 0.1 10*3/uL (ref 0.0–0.5)
Eosinophils Relative: 1 %
HCT: 27.7 % — ABNORMAL LOW (ref 36.0–46.0)
Hemoglobin: 8.2 g/dL — ABNORMAL LOW (ref 12.0–15.0)
Immature Granulocytes: 2 %
Lymphocytes Relative: 15 %
Lymphs Abs: 0.8 10*3/uL (ref 0.7–4.0)
MCH: 28.3 pg (ref 26.0–34.0)
MCHC: 29.6 g/dL — ABNORMAL LOW (ref 30.0–36.0)
MCV: 95.5 fL (ref 80.0–100.0)
Monocytes Absolute: 0.3 10*3/uL (ref 0.1–1.0)
Monocytes Relative: 6 %
Neutro Abs: 3.9 10*3/uL (ref 1.7–7.7)
Neutrophils Relative %: 76 %
Platelets: 345 10*3/uL (ref 150–400)
RBC: 2.9 MIL/uL — ABNORMAL LOW (ref 3.87–5.11)
RDW: 17.7 % — ABNORMAL HIGH (ref 11.5–15.5)
WBC: 5.1 10*3/uL (ref 4.0–10.5)
nRBC: 0.4 % — ABNORMAL HIGH (ref 0.0–0.2)

## 2021-01-11 LAB — GLUCOSE, CAPILLARY
Glucose-Capillary: 124 mg/dL — ABNORMAL HIGH (ref 70–99)
Glucose-Capillary: 127 mg/dL — ABNORMAL HIGH (ref 70–99)
Glucose-Capillary: 120 mg/dL — ABNORMAL HIGH (ref 70–99)
Glucose-Capillary: 127 mg/dL — ABNORMAL HIGH (ref 70–99)
Glucose-Capillary: 120 mg/dL — ABNORMAL HIGH (ref 70–99)

## 2021-01-11 LAB — AMMONIA: Ammonia: 18 umol/L (ref 9–35)

## 2021-01-11 MED ORDER — LACTULOSE 10 GM/15ML PO SOLN
30.0000 g | Freq: Two times a day (BID) | ORAL | Status: DC
  Administered 2021-01-11 – 2021-01-12 (×3): 30 g
  Filled 2021-01-11 (×3): qty 45

## 2021-01-11 NOTE — Progress Notes (Signed)
  Speech Language Pathology Treatment: Dysphagia  Patient Details Name: Kaylee King MRN: [id number] DOB: 1941-05-29 Today's Date: 01/11/2021 Time: 4431-5400 SLP Time Calculation (min) (ACUTE ONLY): 14 min  Assessment / Plan / Recommendation Clinical Impression  No POs were observed today due to pt refusal despite Max encouragment from SLP and a variety of flavors and consistencies offered. SLP provided continued education on the importance of observing her with PO trials, but she continues to demonstrate decreased reasoning and participation. Given that no overt s/s of aspiration were observed in previous treatments, recommend continued DYS 1 diet with thin liquids; however, inadequate oral intake is still a concern. SLP will continue to follow.    HPI HPI: Kaylee King is a 80 y.o. 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  CXR was remarkable for "Probable atelectasis at the bases." G-tube was placed on 12/18/20.  Most recent BSE on 12/01/20 with recommendations for NPO, progressive to D2/thin during that admission.      SLP Plan  Continue with current plan of care       Recommendations  Diet recommendations: Dysphagia 1 (puree);Thin liquid Liquids provided via: Cup;Straw Medication Administration: Crushed with puree Supervision: Staff to assist with self feeding;Full supervision/cueing for compensatory strategies Compensations: Slow rate;Small sips/bites;Minimize environmental distractions Postural Changes and/or Swallow Maneuvers: Seated upright 90 degrees                Oral Care Recommendations: Oral care QID Follow up Recommendations: Skilled Nursing facility SLP Visit Diagnosis: Dysphagia, unspecified (R13.10) Plan: Continue with current plan of care       GO                Zettie Cooley., SLP Student 01/11/2021, 9:40  AM

## 2021-01-11 NOTE — Evaluation (Signed)
Physical Therapy Evaluation Patient Details Name: Kaylee King MRN: [id number] DOB: 03/20/1941 Today's Date: 01/11/2021   History of Present Illness  80 y.o. 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  CXR was remarkable for "Probable atelectasis at the bases." G-tube was placed on 12/18/20.  Clinical Impression  Pt presents to PT with deficits in functional mobility, gait, balance, endurance, strength, power. Pt is limited by fatigue during session, reporting multiple times that she is tired. Pt's fatigue seems to limit participation during session, as the pt requires frequent cues for mobility and only intermittently demonstrates active motion of LEs. Pt requires significant assistance to perform all functional mobility and will benefit from acute PT POC to improve strength and activity tolerance. PT recommends return to SNF at this time.    Follow Up Recommendations SNF    Equipment Recommendations  Hospital bed;Wheelchair (measurements PT) (mechanical lift, all if D/C home)    Recommendations for Other Services       Precautions / Restrictions Precautions Precautions: Fall Precaution Comments: Tube feeds Restrictions Weight Bearing Restrictions: No      Mobility  Bed Mobility Overal bed mobility: Needs Assistance Bed Mobility: Rolling;Supine to Sit;Sit to Supine Rolling: Max assist   Supine to sit: Max assist;HOB elevated Sit to supine: Max assist;HOB elevated        Transfers Overall transfer level: Needs assistance Equipment used: 1 person hand held assist Transfers: Sit to/from Stand Sit to Stand: Max assist;From elevated surface         General transfer comment: PT provides BUE support and knee block, decreased initiation from patient, does utilize knee and hip extensors to assist in standing after PT  initiation  Ambulation/Gait                Stairs            Wheelchair Mobility    Modified Rankin (Stroke Patients Only)       Balance Overall balance assessment: Needs assistance Sitting-balance support: Single extremity supported;Bilateral upper extremity supported;Feet supported Sitting balance-Leahy Scale: Poor Sitting balance - Comments: minA with BUE support of bed Postural control: Right lateral lean Standing balance support: Bilateral upper extremity supported Standing balance-Leahy Scale: Zero Standing balance comment: maxA                             Pertinent Vitals/Pain Pain Assessment: Faces Faces Pain Scale: Hurts little more Pain Location: generalized Pain Descriptors / Indicators: Grimacing;Discomfort;Guarding Pain Intervention(s): Monitored during session    Home Living Family/patient expects to be discharged to:: Skilled nursing facility   Available Help at Discharge: Skilled Nursing Facility;Available 24 hours/day             Additional Comments: pt was living alone and mobilizing independently prior to admission in January 2022, since has been at [facility]    Prior Function Level of Independence: Needs assistance   Gait / Transfers Assistance Needed: Unclear if patient was out of the bed at the SNF.  Max A  ADL's / Homemaking Assistance Needed: Max A to total A assumed given eval 3/10        Hand Dominance   Dominant Hand: Right    Extremity/Trunk Assessment   Upper Extremity Assessment Upper Extremity Assessment: Overall WFL for tasks assessed    Lower Extremity Assessment Lower Extremity Assessment:  Defer to PT evaluation RLE Deficits / Details: PROM WFL, minimal AROM noted during session, pt does initiate SLR with both LE in supine LLE Deficits / Details: PROM WFL, minimal AROM noted during session, pt does initiate SLR with both LE in supine    Cervical / Trunk Assessment Cervical / Trunk  Assessment: Kyphotic  Communication   Communication: Expressive difficulties  Cognition Arousal/Alertness: Awake/alert Behavior During Therapy: Flat affect Overall Cognitive Status: Impaired/Different from baseline Area of Impairment: Orientation;Attention;Following commands;Safety/judgement;Awareness;Problem solving                 Orientation Level: Disoriented to;Time;Situation;Place Current Attention Level: Focused Memory: Decreased short-term memory Following Commands: Follows one step commands with increased time Safety/Judgement: Decreased awareness of safety;Decreased awareness of deficits Awareness: Intellectual Problem Solving: Slow processing;Decreased initiation;Difficulty sequencing;Requires verbal cues;Requires tactile cues General Comments: Patient with occasional one word answers when prompted.      General Comments General comments (skin integrity, edema, etc.): pt on 2L [place] upon arrival, saturating at 94%. Pt desats to 91% on RA, returned to 2L [place] at end of session.    Exercises     Assessment/Plan    PT Assessment Patient needs continued PT services  PT Problem List Decreased strength;Decreased activity tolerance;Decreased balance;Decreased mobility;Decreased cognition;Decreased knowledge of use of DME;Decreased safety awareness;Decreased knowledge of precautions;Cardiopulmonary status limiting activity       PT Treatment Interventions DME instruction;Functional mobility training;Therapeutic activities;Therapeutic exercise;Balance training;Neuromuscular re-education;Cognitive remediation;Patient/family education;Wheelchair mobility training    PT Goals (Current goals can be found in the Care Plan section)  Acute Rehab PT Goals Patient Stated Goal: to return to SNF PT Goal Formulation:  (per case management, pt unable to participate in goal setting) Time For Goal Achievement: 01/25/21 Potential to Achieve Goals: Poor    Frequency Min 2X/week    Barriers to discharge        Co-evaluation PT/OT/SLP Co-Evaluation/Treatment: Yes Reason for Co-Treatment: For patient/therapist safety PT goals addressed during session: Mobility/safety with mobility;Balance;Strengthening/ROM OT goals addressed during session: ADL's and self-care       AM-PAC PT "6 Clicks" Mobility  Outcome Measure Help needed turning from your back to your side while in a flat bed without using bedrails?: A Lot Help needed moving from lying on your back to sitting on the side of a flat bed without using bedrails?: A Lot Help needed moving to and from a bed to a chair (including a wheelchair)?: A Lot Help needed standing up from a chair using your arms (e.g., wheelchair or bedside chair)?: A Lot Help needed to walk in hospital room?: Total Help needed climbing 3-5 steps with a railing? : Total 6 Click Score: 10    End of Session Equipment Utilized During Treatment: Oxygen Activity Tolerance: Patient limited by fatigue Patient left: in bed;with call bell/phone within reach;with bed alarm set Nurse Communication: Mobility status;Need for lift equipment PT Visit Diagnosis: Other abnormalities of gait and mobility (R26.89);Muscle weakness (generalized) (M62.81)    Time: 0350-0938 PT Time Calculation (min) (ACUTE ONLY): 24 min   Charges:   PT Evaluation $PT Eval Moderate Complexity: 1 Mod          Arlyss Gandy, PT, DPT Acute Rehabilitation Pager: [phone]   Arlyss Gandy 01/11/2021, 10:47 AM

## 2021-01-11 NOTE — Progress Notes (Signed)
Nutrition Follow-up  DOCUMENTATION CODES:   Not applicable  INTERVENTION:   -Continue MVI with minerals daily  -Continue tube feeding viaG-tube: Osmolite 1.2at 20m/h(1440ml per day) Prosource [id number] daily  Provides1768kcal, 91gm protein, 118138mree water daily  Free water flushes 200 ml QID for a total of 1981 ml free water daily.  NUTRITION DIAGNOSIS:   Inadequate oral intake related to dysphagia as evidenced by NPO status.  Ongoing  GOAL:   Patient will meet greater than or equal to 90% of their needs  Met with TF  MONITOR:   TF tolerance,Diet advancement,Labs,Skin  REASON FOR ASSESSMENT:   Consult Enteral/tube feeding initiation and management  ASSESSMENT:   80 18 female admitted with severe sepsis 2/2 UTI. PMH includes hypothyroidism, NASH cirrhosis, portal HTN, GI bleed, esophageal/gastric varices, dysphagia, PEG, COPD.  3/7- s/p BSE- advanced to dysphagia 1 diet with thin liquids 3/9- s/p EGD- revealed healed gastric ulcers, esophageal varices s/p banding  Reviewed I/O's: -450 ml x 24 hours and +227 ml since admission  UOP: 800 ml x 24 hours  Pt now NPO. While pt was on dysphagia 1 diet, pt with very minimal intake and often refused to eat. Noted meal completions 0-5%.   Pt remains dependent on TF via PEG. TF resumed yesterday after EGD and tolerating well.   Medications reviewed and include lactulose and thiamine.   Palliative care following; plan to treat what is treatable for now, but plan for follow-up meeting on Friday, 01/12/21.   Labs reviewed: CBGS: 106-127 (inpatient orders for glycemic control are none).   Diet Order:   Diet Order            Diet NPO time specified  Diet effective midnight                 EDUCATION NEEDS:   Not appropriate for education at this time  Skin:  Skin Assessment: Skin Integrity Issues: Skin Integrity Issues:: Stage II,Other (Comment) Stage II: lt elbow, sacrum Incisions:  - Other: MASD perineum  Last BM:  01/11/21  Height:   Ht Readings from Last 1 Encounters:  01/07/21 '5\' 4"'  (1.626 m)    Weight:   Wt Readings from Last 1 Encounters:  01/11/21 68.4 kg    Ideal Body Weight:  54.5 kg  BMI:  Body mass index is 25.88 kg/m.  Estimated Nutritional Needs:   Kcal:  1600-1800  Protein:  85-100 gm  Fluid:  >/= 1.6 L    JenLoistine ChanceD, LDN, CDCSanfordgistered Dietitian II Certified Diabetes Care and Education Specialist Please refer to [facility] RD and/or RD on-call/weekend/after hours pager

## 2021-01-11 NOTE — Evaluation (Signed)
Occupational Therapy Evaluation Patient Details Name: Kaylee King MRN: [id number] DOB: [birth year] Today's Date: 01/11/2021    History of Present Illness 80 y.o. 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  CXR was remarkable for "Probable atelectasis at the bases." G-tube was placed on 12/18/20.   Clinical Impression   Patient admitted for the diagnosis above.  Patient found supine with BM.  Patient needing up to Max A for basic rolling, Total assist for hygiene and lower body ADL.  Max A for upper body ADL, and Max A for bed mobility and attempted stand with PT.  Patient is scheduled for a return to SNF this date.  OT will follow in the acute setting if she remains.      Follow Up Recommendations  SNF    Equipment Recommendations  None recommended by OT    Recommendations for Other Services       Precautions / Restrictions Precautions Precautions: Fall Precaution Comments: Tube feeds Restrictions Weight Bearing Restrictions: No      Mobility Bed Mobility Overal bed mobility: Needs Assistance Bed Mobility: Rolling;Supine to Sit;Sit to Supine Rolling: Max assist   Supine to sit: Max assist;HOB elevated Sit to supine: Max assist;HOB elevated        Transfers Overall transfer level: Needs assistance Equipment used: 1 person hand held assist Transfers: Sit to/from Stand Sit to Stand: Max assist;From elevated surface         General transfer comment: PT provides BUE support and knee block, decreased initiation from patient, does utilize knee and hip extensors to assist in standing after PT initiation    Balance Overall balance assessment: Needs assistance Sitting-balance support: Single extremity supported;Bilateral upper extremity supported;Feet supported Sitting balance-Leahy Scale: Poor Sitting balance - Comments:  minA with BUE support of bed Postural control: Posterior lean;Right lateral lean Standing balance support: Bilateral upper extremity supported Standing balance-Leahy Scale: Zero Standing balance comment: maxA                           ADL either performed or assessed with clinical judgement   ADL Overall ADL's : Needs assistance/impaired Eating/Feeding: NPO       Upper Body Bathing: Maximal assistance;Bed level   Lower Body Bathing: Total assistance;Bed level   Upper Body Dressing : Maximal assistance;Bed level   Lower Body Dressing: Total assistance;Bed level               Functional mobility during ADLs: Maximal assistance General ADL Comments: patient with decreased initiation     Vision   Additional Comments: atient did track therapists and made eye contact, difficult to assess given LOA     Perception     Praxis      Pertinent Vitals/Pain Pain Assessment: Faces Faces Pain Scale: Hurts little more Pain Location: generalized Pain Descriptors / Indicators: Grimacing;Discomfort;Guarding Pain Intervention(s): Monitored during session     Hand Dominance Right   Extremity/Trunk Assessment Upper Extremity Assessment Upper Extremity Assessment: Overall WFL for tasks assessed   Lower Extremity Assessment Lower Extremity Assessment: Defer to PT evaluation RLE Deficits / Details: PROM WFL, minimal AROM noted during session, pt does initiate SLR with both LE in supine LLE Deficits / Details: PROM WFL, minimal AROM noted during session, pt does initiate SLR with both LE in supine   Cervical / Trunk Assessment Cervical / Trunk Assessment:  Kyphotic   Communication Communication Communication: Expressive difficulties   Cognition Arousal/Alertness: Awake/alert Behavior During Therapy: Flat affect Overall Cognitive Status: Impaired/Different from baseline Area of Impairment: Orientation;Attention;Following commands;Safety/judgement;Awareness;Problem  solving                 Orientation Level: Disoriented to;Time;Situation;Place Current Attention Level: Focused Memory: Decreased short-term memory Following Commands: Follows one step commands with increased time Safety/Judgement: Decreased awareness of safety;Decreased awareness of deficits Awareness: Intellectual Problem Solving: Slow processing;Decreased initiation;Difficulty sequencing;Requires verbal cues;Requires tactile cues General Comments: Patient with occasional one word answers when prompted.   General Comments  pt on 2L [place] upon arrival, saturating at 94%. Pt desats to 91% on RA, returned to 2L [place] at end of session.    Exercises     Shoulder Instructions      Home Living Family/patient expects to be discharged to:: Skilled nursing facility   Available Help at Discharge: Skilled Nursing Facility;Available 24 hours/day                             Additional Comments: pt was living alone and mobilizing independently prior to admission in January 2022, since has been at [facility]      Prior Functioning/Environment Level of Independence: Needs assistance  Gait / Transfers Assistance Needed: Unclear if patient was out of the bed at the SNF.  Max A ADL's / Homemaking Assistance Needed: Max A to total A assumed given eval 3/10            OT Problem List: Decreased strength;Decreased activity tolerance;Impaired balance (sitting and/or standing);Decreased knowledge of use of DME or AE;Decreased cognition;Decreased safety awareness;Decreased coordination      OT Treatment/Interventions: Self-care/ADL training;Therapeutic exercise;Therapeutic activities;Cognitive remediation/compensation;Patient/family education;Balance training    OT Goals(Current goals can be found in the care plan section) Acute Rehab OT Goals Patient Stated Goal: None stated.  CM plan is for return to SNF OT Goal Formulation: Patient unable to participate in goal  setting Time For Goal Achievement: 01/25/21 Potential to Achieve Goals: Fair ADL Goals Pt Will Perform Grooming: with min assist;bed level Pt Will Perform Upper Body Bathing: with mod assist;bed level Pt Will Perform Upper Body Dressing: with mod assist;bed level Additional ADL Goal #1: Patient will roll from side to side with Mod A and SR's to increase independence with toileting.  OT Frequency: Min 2X/week   Barriers to D/C:            Co-evaluation PT/OT/SLP Co-Evaluation/Treatment: Yes Reason for Co-Treatment: For patient/therapist safety PT goals addressed during session: Mobility/safety with mobility;Balance;Strengthening/ROM OT goals addressed during session: ADL's and self-care      AM-PAC OT "6 Clicks" Daily Activity     Outcome Measure Help from another person eating meals?: Total Help from another person taking care of personal grooming?: A Lot Help from another person toileting, which includes using toliet, bedpan, or urinal?: A Lot Help from another person bathing (including washing, rinsing, drying)?: A Lot Help from another person to put on and taking off regular upper body clothing?: A Lot Help from another person to put on and taking off regular lower body clothing?: Total 6 Click Score: 10   End of Session Equipment Utilized During Treatment: Oxygen Nurse Communication: Mobility status  Activity Tolerance: Patient limited by lethargy Patient left: in bed;with call bell/phone within reach;with bed alarm set  OT Visit Diagnosis: Unsteadiness on feet (R26.81);Muscle weakness (generalized) (M62.81);Dizziness and giddiness (R42)  Time: 3832-9191 OT Time Calculation (min): 26 min Charges:  OT General Charges $OT Visit: 1 Visit OT Evaluation $OT Eval Moderate Complexity: 1 Mod  01/11/2021  Rich, OTR/L  Acute Rehabilitation Services  Office:  [phone]   Suzanna Obey 01/11/2021, 10:50 AM

## 2021-01-11 NOTE — Progress Notes (Signed)
PROGRESS NOTE    Kaylee King  AJG:[id number] DOB: 12-Mar-1941 DOA: 01/06/2021 PCP: Fleet Contras, MD    Brief Narrative:  Kaylee King was admitted to the hospital with working diagnosis of severe sepsis secondary to urinary tract infection, complicated by intramuscular right psoas hematoma/questionable abscess and bacteremia.  79 year old female past medical history for hypothyroidism, cirrhosis due to [place], portal hypertension, esophageal and gastric varices who presented with altered mentation.  Patient unable to provide any history on admission, apparently she had been experiencing pain at the skilled nursing facility.  When her initial physical examination her oximetry was 85% on room air, she was tachypneic 910 to 25 breaths/min, blood pressure 91/47, 116/45, she had dry mucous membranes, her lungs were clear to auscultation bilaterally, heart S1-S2, present rhythmic, her abdomen was soft nontender, J-tube in place, no lower extremity edema.  She was lethargic and unable to answer any questions.  Urinalysis specific gravity 1.013, 30 protein, 21-50 white cells, 0-5 red cells.  She was placed on broad-spectrum antibiotic therapy (daptomycin, ceftriaxone, cefepime) and intravenous fluids. Her blood culture yielded multiple organisms grew Enterococcus faecalis and vancomycin-resistant Enterococcus.  Positive Proteus mirabilis.  Urine culture Pseudomonas.  01/07/21 CT chest abdomen and pelvis she was found to have a right psoas muscle hematoma questionable abscess. Interval contraction from 12/16/20.   Antibiotics were discontinued considering possible contamination of cultures.  Patient underwent EGD, found medium sized distal esophagus varices without signs of bleeding. Five bands were successfully placed with complete eradication. Gastric ulcers have healed.   Assessment & Plan:   Principal Problem:   Polymicrobial bacterial infection Active Problems:   Hypothyroid   Acute  metabolic encephalopathy   GI bleed   Acute respiratory failure with hypoxia and hypercapnia (HCC)   Sepsis (HCC)   Acute lower UTI   Normocytic anemia   Dysphagia   PEG (percutaneous endoscopic gastrostomy) status (HCC)   Hypotension   Altered mental status   Cirrhosis (HCC)   1. Severe sepsis due to urinary tract infection, complicated with possible right psoas hematoma and bacteremia. (endorgan failure metabolic encephalopathy and hypotension)  Follow up blood cultures 03/06 with one bottle with no growth x 3 days, other with staph epidermidis.  Cultures from 03/05 x 2 bottles, proteus and VRE enterococcus.   Antibiotic therapy has been stopped after about 4 days.   Today her wbc continue to be stable at 5,1, she continue to be afebrile, and denies any urinary symptoms.  If now signs of recurrent or persistent bacterial infection, will plan to dc to SNF in am. Continue follow up on cell count and temperature curve.    2. Acute hypoxemic respiratory failure due to atelectasis. On airway clearing techniques and oxymetry monitoring.  CT angiography with no pulmonary embolism.   Oxymetry today is 97% on 2 L/min per [place]  3. NASH with cirrhosis, portal hypertension, with gastric and esophageal varices. No signs of bleeding. Patient had esophageal varices banded yesterday with good toleration.  Her ammonia is not elevated.   Continue rifaximin, decrease lactulose to 30 g bid.  Her mentation has improved, I suspect close to her baseline.   4. Hypothyroid. Continue with levothyroxine. TSH 15.1 from 20,6 (02/22). Tolerating well increase dose of levothyroxine. Will need follow thyroid function testing in 2 to 3 weeks as outpatient.   5. Swallow dysfunction with moderate protein calorie malnutrition.  Resumed tube feedings with good toleration.   On nutritional supplements, vitamins and thiamine (200 mg bid)  6, Abdominal aortic  aneurysm. 3,2 cm diameter. Continue close follow  up as outpatient.    Status is: Inpatient  Remains inpatient appropriate because:Inpatient level of care appropriate due to severity of illness   Dispo: The patient is from: SNF              Anticipated d/c is to: SNF              Patient currently is not medically stable to d/c.   Difficult to place patient No   DVT prophylaxis:  enoxaparin   Code Status:   DNR   Family Communication:  I spoke over the phone with the patient's daughter about patient's  condition, plan of care, prognosis and all questions were addressed.    Nutrition Status: Nutrition Problem: Inadequate oral intake Etiology: dysphagia Signs/Symptoms: NPO status Interventions: Tube feeding      Consultants:   GI   ID  Procedures:   EGD      Subjective: Patient is feeling better, denies any pain or dyspnea, looks fatigued. Able to respond to questions and follow commands.   Objective: Vitals:   01/11/21 0225 01/11/21 0428 01/11/21 1047 01/11/21 1137  BP:  110/66  (!) 123/59  Pulse:  81  82  Resp:  20  16  Temp:  98.6 F (37 C)  98 F (36.7 C)  TempSrc:  Oral  Oral  SpO2:  99% 91% 97%  Weight: 68.4 kg     Height:        Intake/Output Summary (Last 24 hours) at 01/11/2021 1224 Last data filed at 01/11/2021 1143 Gross per 24 hour  Intake 300 ml  Output 1300 ml  Net -1000 ml   Filed Weights   01/09/21 0338 01/10/21 0047 01/11/21 0225  Weight: 69.9 kg 69.7 kg 68.4 kg    Examination:   General: deconditioned  Neurology: Awake and alert, non focal  E ENT: positive pallor, no icterus, oral mucosa moist Cardiovascular: No JVD. S1-S2 present, rhythmic, no gallops, rubs, or murmurs. No lower extremity edema. Pulmonary: positive breath sounds bilaterally, adequate air movement, no wheezing, rhonchi or rales. Gastrointestinal. Abdomen mild distended but not tender Skin. No rashes Musculoskeletal: no joint deformities     Data Reviewed: I have personally reviewed following labs and  imaging studies  CBC: Recent Labs  Lab 01/06/21 0332 01/06/21 1211 01/07/21 0300 01/08/21 0432 01/09/21 0340 01/10/21 0319 01/11/21 0307  WBC 16.7*  --  8.2 6.9 6.0 6.4 5.1  NEUTROABS 15.7*  --   --   --   --   --  3.9  HGB 7.7*   < > 8.4* 8.1* 8.0* 8.3* 8.2*  HCT 26.5*   < > 27.8* 27.4* 26.9* 28.0* 27.7*  MCV 97.1  --  93.9 95.5 94.7 95.9 95.5  PLT 498*  --  409* 422* 395 352 345   < > = values in this interval not displayed.   Basic Metabolic Panel: Recent Labs  Lab 01/07/21 0300 01/08/21 0432 01/09/21 0340 01/10/21 0319 01/11/21 0307  NA 137 134* 135 137 136  K 5.1 4.2 4.9 4.7 4.8  CL 106 106 105 106 105  CO2 21* 18* 22 22 25   GLUCOSE 80 126* 127* 119* 118*  BUN 21 18 15 16 17   CREATININE 0.95 0.87 0.72 0.71 0.67  CALCIUM 8.5* 8.2* 8.1* 8.3* 8.6*   GFR: Estimated Creatinine Clearance: 51.5 mL/min (by C-G formula based on SCr of 0.67 mg/dL). Liver Function Tests: Recent Labs  Lab 01/06/21 01/07/21  0300 01/08/21 0432 01/10/21 0319  AST 63* 52* 50* 81*  ALT 57* 42 44 64*  ALKPHOS 289* 211* 206* 166*  BILITOT 1.1 1.0 0.7 0.7  PROT 7.2 6.5 6.7 6.6  ALBUMIN 2.3* 2.1* 2.1* 2.2*   No results for input(s): LIPASE, AMYLASE in the last 168 hours. Recent Labs  Lab 01/06/21 0436 01/08/21 0432 01/11/21 0307  AMMONIA 29 31 18    Coagulation Profile: Recent Labs  Lab 01/06/21 0332 01/06/21 0503  INR 1.1 1.1   Cardiac Enzymes: Recent Labs  Lab 01/07/21 0300  CKTOTAL 34*   BNP (last 3 results) No results for input(s): PROBNP in the last 8760 hours. HbA1C: No results for input(s): HGBA1C in the last 72 hours. CBG: Recent Labs  Lab 01/10/21 2016 01/10/21 2331 01/11/21 0430 01/11/21 0620 01/11/21 1138  GLUCAP 106* 122* 124* 127* 120*   Lipid Profile: No results for input(s): CHOL, HDL, LDLCALC, TRIG, CHOLHDL, LDLDIRECT in the last 72 hours. Thyroid Function Tests: No results for input(s): TSH, T4TOTAL, FREET4, T3FREE, THYROIDAB in the last  72 hours. Anemia Panel: No results for input(s): VITAMINB12, FOLATE, FERRITIN, TIBC, IRON, RETICCTPCT in the last 72 hours.    Radiology Studies: I have reviewed all of the imaging during this hospital visit personally     Scheduled Meds: . enoxaparin (LOVENOX) injection  40 mg Subcutaneous Q24H  . feeding supplement (PROSource TF)  45 mL Per Tube Daily  . free water  200 mL Per Tube Q6H  . lactulose  30 g Per Tube TID  . levothyroxine  150 mcg Per Tube QAC breakfast  . multivitamin with minerals  1 tablet Per Tube Daily  . nystatin ointment  1 application Topical BID  . pantoprazole sodium  40 mg Per Tube BID  . rifaximin  550 mg Per Tube BID  . thiamine  200 mg Per Tube BID   Continuous Infusions: . sodium chloride Stopped (01/08/21 2057)  . feeding supplement (OSMOLITE 1.2 CAL) 1,000 mL (01/11/21 0927)     LOS: 5 days        Mauricio 03/13/21, MD

## 2021-01-11 NOTE — Care Management Important Message (Signed)
Important Message  Patient Details  Name: Kaylee King MRN: [id number] Date of Birth: [birth year]   Medicare Important Message Given:  Yes     Renie Ora 01/11/2021, 8:52 AM

## 2021-01-12 ENCOUNTER — Encounter (HOSPITAL_COMMUNITY): Payer: Self-pay | Admitting: Gastroenterology

## 2021-01-12 ENCOUNTER — Telehealth: Payer: Self-pay | Admitting: Student

## 2021-01-12 DIAGNOSIS — Z66 Do not resuscitate: Secondary | ICD-10-CM

## 2021-01-12 LAB — BASIC METABOLIC PANEL
Anion gap: 7 (ref 5–15)
BUN: 25 mg/dL — ABNORMAL HIGH (ref 8–23)
CO2: 23 mmol/L (ref 22–32)
Calcium: 8.4 mg/dL — ABNORMAL LOW (ref 8.9–10.3)
Chloride: 104 mmol/L (ref 98–111)
Creatinine, Ser: 0.69 mg/dL (ref 0.44–1.00)
GFR, Estimated: >60 mL/min (ref >60)
Glucose, Bld: 106 mg/dL — ABNORMAL HIGH (ref 70–99)
Potassium: 4.8 mmol/L (ref 3.5–5.1)
Sodium: 134 mmol/L — ABNORMAL LOW (ref 135–145)

## 2021-01-12 LAB — CBC WITH DIFFERENTIAL/PLATELET
Abs Immature Granulocytes: 0.11 10*3/uL — ABNORMAL HIGH (ref 0.00–0.07)
Basophils Absolute: 0.1 10*3/uL (ref 0.0–0.1)
Basophils Relative: 1 %
Eosinophils Absolute: 0.1 10*3/uL (ref 0.0–0.5)
Eosinophils Relative: 1 %
HCT: 24.8 % — ABNORMAL LOW (ref 36.0–46.0)
Hemoglobin: 7.8 g/dL — ABNORMAL LOW (ref 12.0–15.0)
Immature Granulocytes: 2 %
Lymphocytes Relative: 13 %
Lymphs Abs: 1 10*3/uL (ref 0.7–4.0)
MCH: 29 pg (ref 26.0–34.0)
MCHC: 31.5 g/dL (ref 30.0–36.0)
MCV: 92.2 fL (ref 80.0–100.0)
Monocytes Absolute: 0.4 10*3/uL (ref 0.1–1.0)
Monocytes Relative: 6 %
Neutro Abs: 5.8 10*3/uL (ref 1.7–7.7)
Neutrophils Relative %: 77 %
Platelets: 335 10*3/uL (ref 150–400)
RBC: 2.69 MIL/uL — ABNORMAL LOW (ref 3.87–5.11)
RDW: 17.7 % — ABNORMAL HIGH (ref 11.5–15.5)
WBC: 7.4 10*3/uL (ref 4.0–10.5)
nRBC: 0.5 % — ABNORMAL HIGH (ref 0.0–0.2)

## 2021-01-12 LAB — GLUCOSE, CAPILLARY
Glucose-Capillary: 116 mg/dL — ABNORMAL HIGH (ref 70–99)
Glucose-Capillary: 119 mg/dL — ABNORMAL HIGH (ref 70–99)
Glucose-Capillary: 129 mg/dL — ABNORMAL HIGH (ref 70–99)
Glucose-Capillary: 130 mg/dL — ABNORMAL HIGH (ref 70–99)
Glucose-Capillary: 130 mg/dL — ABNORMAL HIGH (ref 70–99)
Glucose-Capillary: 140 mg/dL — ABNORMAL HIGH (ref 70–99)

## 2021-01-12 LAB — CULTURE, BLOOD (ROUTINE X 2): Culture: NO GROWTH

## 2021-01-12 MED ORDER — LEVOTHYROXINE SODIUM 150 MCG PO TABS: 150.0000 ug | ORAL_TABLET | Freq: Every day | ORAL | 0 refills | Status: AC

## 2021-01-12 MED ORDER — ADULT MULTIVITAMIN W/MINERALS CH: 1.0000 | ORAL_TABLET | Freq: Every day | ORAL | 0 refills | Status: AC

## 2021-01-12 MED ORDER — MORPHINE SULFATE 20 MG/5ML PO SOLN: 10.0000 mg | ORAL | 0 refills | Status: DC | PRN

## 2021-01-12 NOTE — Telephone Encounter (Signed)
Palliative NP reached out to daughter Elita Quick regarding patient as she is returning to [place] today. Pam states she would like for patient to receive therapy and hopes that she will be able to return home. Patient will be seen Tuesday at [facility].

## 2021-01-12 NOTE — Discharge Summary (Addendum)
Physician Discharge Summary  Kaylee King ZOX:[id number] DOB: [birth year] DOA: 01/06/2021  PCP: Kaylee Contras, MD  Admit date: 01/06/2021 Discharge date: 01/12/2021  Admitted From: SNF  Disposition:  SNF with palliative care   Recommendations for Outpatient Follow-up and new medication changes:  1. Follow up with Dr. Concepcion Elk in 7 days.  2. Dose of levothyroxine has been increased to 150 mcg daily 3. Follow thyroid function testing in 14 days.  4. Follow with [facility]  5. Holding on amlodipine for now to prevent hypotension.   I spoke over the phone with the patient's dughter about patient's  condition, plan of care, prognosis and all questions were addressed.  Home Health: na   Equipment/Devices: na    Discharge Condition: stable CODE STATUS: DNR   Diet recommendation: tube feedings    Diet recommendations: Dysphagia 1 (puree);Thin liquid Liquids provided via: Cup;Straw Medication Administration: Crushed with puree Supervision: Staff to assist with self feeding;Full supervision/cueing for compensatory strategies Compensations: Slow rate;Small sips/bites;Minimize environmental distractions Postural Changes and/or Swallow Maneuvers: Seated upright 90 degrees     Brief/Interim Summary: KayleeSewardwas admitted to the hospital with working diagnosis of severe sepsis secondary to urinary tract infection, complicated by intramuscularright psoashematoma/questionableabscess and bacteremia. (endorgan failure encephalopathy). Present on admission.   80 year old female past medical history for hypothyroidism, cirrhosis due to [place], portal hypertension, esophageal and gastric varices who presented with altered mentation. Patient unable to provide any history on admission, apparently she had been experiencing pain at the skilled nursing facility. When her initial physical examination her oximetry was 85% on room air, she was tachypneic with 25 breaths/min, blood pressure 91/47,  116/45, HR 73, she had dry mucous membranes, her lungs were clear to auscultation bilaterally, heart S1-S2, present rhythmic, her abdomen was soft nontender, peg-tube in place, no lower extremity edema. She was lethargic and unable to answer any questions.  Sodium 133, potassium 5.2, chloride 101, bicarb 22, glucose 191, BUN 26, creatinine 0.97, lactic acid 2.2, white count 16.7, hemoglobin 7.7, hematocrit 26.5, platelets 498 SARS COVID-19 negative.  Urinalysis specific gravity 1.013, 30 protein, 21-50 white cells, 0-5 red cells.  Chest radiograph with atelectasis left base. EKG 84 bpm, left axis deviation, left anterior fascicular block, normal intervals, sinus rhythm, q wave lead I aVL, poor R wave progression, no ST segment or T wave changes.  She was placed on broad-spectrum antibiotic therapy(daptomycin, ceftriaxone, cefepime)and intravenous fluids. Her blood culture yielded multiple organisms grew Enterococcus faecalis and vancomycin-resistant Enterococcus. Positive Proteus mirabilis. Urine culture Pseudomonas.  01/07/21 CT chest abdomen and pelvis she was found to have arightpsoas muscle hematoma questionable abscess.Interval contraction from 12/16/20. 01/07/21 CT chest negative for pulmonary embolus.  Antibiotics were discontinued considering possible contaminationof blood cultures.  Patient underwent EGD, found medium sized distal esophagus varices without signs of bleeding. Five bands were successfully placed with complete eradication. Gastric ulcers have healed.   1.  Severe sepsis due to urinary tract infection, complicated with right psoas hematoma and bacteremia (endorgan failure metabolic encephalopathy and hypotension) present on admission. Patient was admitted to the medical ward, received broad-spectrum antibiotic therapy with good toleration.  She completed full 4 days with improvement of her symptoms.  At discharge she remained hemodynamically stable, afebrile  and with no leukocytosis.  2.  Acute hypoxic respiratory failure due to atelectasis.  Chest radiograph and CT chest with no signs of pulmonary infection.  CT angiography negative for pulmonary embolism. She has poor respiratory effort, was placed with control oxygen and had oximetry monitoring.  Encourage airway clearing techniques with incentive spirometer.  3.  NASH with cirrhosis, portal hypertension, gastric and esophageal varices.  No signs of acute bleeding, patient was seen by gastroenterology and underwent endoscopy, varices were banded with good toleration. She had no signs of portal hepatic encephalopathy, no asterixis, and ammonia was not elevated. Patient continue taking rifaximin, spironolactone and lactulose.  4.  Hypothyroidism.  Her TSH has been improving but continued to be high.  On 02/22 was 15.1.  Dose of levothyroxine has been increased to 150 mcg daily, follow-up thyroid function testing in 2 weeks.  5.  Swallow dysfunction, moderate calorie protein malnutrition/ anemia of chronic disease.  Patient was resumed on tube feeds, speech therapy evaluation recommended dysphagia 1 diet. Patient continue taking thiamine and multivitamins. Continue pain control with morphine.  Hgb and Hct remain stable, 7,8 and 24.8  6.  Abdominal aortic aneurysm.  3.2 cm in diameter, follow-up as an outpatient.  7. HTN. Blood pressure has remained well controlled, will hold on amlodipine for now to prevent hypotension.   Discharge Diagnoses:  Principal Problem:   Polymicrobial bacterial infection Active Problems:   Hypothyroid   Acute metabolic encephalopathy   Sepsis (HCC)   Acute lower UTI   Normocytic anemia   Dysphagia   Cirrhosis (HCC)   DNR (do not resuscitate)    Discharge Instructions   Allergies as of 01/12/2021   No Known Allergies     Medication List    STOP taking these medications   amLODipine 5 MG tablet Commonly known as: NORVASC     TAKE these  medications   acetaminophen 325 MG tablet Commonly known as: TYLENOL Place 2 tablets (650 mg total) into feeding tube every 6 (six) hours as needed for headache, fever or mild pain.   free water Soln Place 200 mLs into feeding tube every 6 (six) hours.   lactulose 10 GM/15ML solution Commonly known as: CHRONULAC Place 30 mLs (20 g total) into feeding tube 2 (two) times daily.   levothyroxine 150 MCG tablet Commonly known as: SYNTHROID Place 1 tablet (150 mcg total) into feeding tube daily before breakfast. Start taking on: January 13, 2021 What changed:   medication strength  how much to take   morphine 20 MG/5ML solution Place 2.5 mLs (10 mg total) into feeding tube every 4 (four) hours as needed for pain.   multivitamin with minerals Tabs tablet Place 1 tablet into feeding tube daily. Start taking on: January 13, 2021   nystatin ointment Commonly known as: MYCOSTATIN Apply 1 application topically in the morning and at bedtime. Apply to affected area(s) on groin   pantoprazole 40 MG tablet Commonly known as: PROTONIX Take 40 mg by mouth daily. Via tube   rifaximin 550 MG Tabs tablet Commonly known as: XIFAXAN Place 1 tablet (550 mg total) into feeding tube 2 (two) times daily.   spironolactone 25 MG tablet Commonly known as: ALDACTONE Place 1 tablet (25 mg total) into feeding tube daily.   thiamine 100 MG tablet Place 1 tablet (100 mg total) into feeding tube daily.       No Known Allergies  Consultations:  GI    Procedures/Studies: CT ABDOMEN WO CONTRAST  Result Date: 12/16/2020 CLINICAL DATA:  Assess anatomy for possible percutaneous gastrostomy tube placement EXAM: CT ABDOMEN WITHOUT CONTRAST TECHNIQUE: Multidetector CT imaging of the abdomen was performed following the standard protocol without IV contrast. COMPARISON:  None. FINDINGS: Lower chest: Multifocal patchy airspace opacities in the dependent aspect of the  left lower lobe, and to a lesser extent  the right lower lobe. Trace bilateral pleural effusions. Enlarged main pulmonary artery measuring up to 4 cm in diameter raising the possibility of underlying pulmonary arterial hypertension. Atherosclerotic calcifications visualized throughout the coronary arteries. The heart itself is normal in size. Trace pericardial fluid versus thickening. Ectatic but nonaneurysmal descending thoracic aorta with scattered atherosclerotic plaque. Hepatobiliary: Limited evaluation in the absence of intravenous contrast. Grossly normal morphology and contour. Gallbladder is unremarkable. No intra or extrahepatic biliary ductal dilatation. Pancreas: Unremarkable. No pancreatic ductal dilatation or surrounding inflammatory changes. Spleen: Normal in size without focal abnormality. Adrenals/Urinary Tract: Small nephrolithiasis bilaterally. No evidence of hydronephrosis. No exophytic renal mass. Limited evaluation in the absence of intravenous contrast. Stomach/Bowel: Normal anatomic position of the stomach without colonic interposition. No focal bowel wall thickening or evidence of obstruction. Vascular/Lymphatic: Limited evaluation in the absence of intravenous contrast. Atherosclerotic calcifications throughout the abdominal aorta. Mild aneurysmal dilation of the juxtarenal abdominal aorta measuring up to 3.2 cm. No suspicious lymphadenopathy. Other: Expansion of the right psoas muscle relative to the left with central low attenuation. Nonspecific stranding versus small volume fluid along the right pararenal fascia, likely representing a small amount of hemorrhage. Musculoskeletal: Chronic appearing compression fractures at T10 and T12. Mild levoconvex scoliosis of the lumbar spine with associated multilevel degenerative disc disease. IMPRESSION: 1. Anatomy is suitable for percutaneous gastrostomy tube placement. 2. Relative expansion and central low attenuation of the right psoas muscle relative to the left. Differential  considerations include intro psoas hematoma versus abscess. 3. 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 Recommend follow-up every 3 years. This recommendation follows ACR consensus guidelines: White Paper of the ACR Incidental Findings Committee II on Vascular Findings. J Am Coll Radiol 2013; 10:789-794. Aortic Atherosclerosis (ICD10-I70.0); Aortic aneurysm NOS (ICD10-I71.9). 4. Multifocal patchy airspace opacities in the left greater than right lower lobe concerning for pneumonia versus aspiration. 5. Enlarged main pulmonary artery suggests pulmonary arterial hypertension. 6. Small nonobstructing nephrolithiasis bilaterally. 7. Chronic appearing T12 and T10 compression fractures. Electronically Signed   By: Malachy Moan M.D.   On: 12/16/2020 11:17   CT ANGIO CHEST PE W OR WO CONTRAST  Result Date: 01/07/2021 CLINICAL DATA:  Hypoxemia and bacteremia EXAM: CT ANGIOGRAPHY CHEST CT ABDOMEN AND PELVIS WITH CONTRAST TECHNIQUE: Multidetector CT imaging of the chest was performed using the standard protocol during bolus administration of intravenous contrast. Multiplanar CT image reconstructions and MIPs were obtained to evaluate the vascular anatomy. Multidetector CT imaging of the abdomen and pelvis was performed using the standard protocol during bolus administration of intravenous contrast. CONTRAST:  99mL OMNIPAQUE IOHEXOL 350 MG/ML SOLN COMPARISON:  Ultrasound 01/07/2021 radiograph 01/06/2021 CT 1222 FINDINGS: CTA CHEST FINDINGS Cardiovascular: Satisfactory opacification of pulmonary arteries. No visible pulmonary arterial filling defects though respiratory motion may limit detection of smaller segmental and subsegmental filling defects. Central pulmonary arteries are enlarged though similar to prior caliber. Cardiac size is within normal limits. No pericardial effusion. Three-vessel coronary artery atherosclerosis. The aortic root is suboptimally assessed given cardiac  pulsation artifact. Atherosclerotic plaque throughout the thoracic aorta. Dilatation of the distal thoracic aortic arch to 3.6 cm, returning to a more normal caliber by the level of the diaphragmatic hiatus of 2.9 cm. Mild aortic tortuosity, can be senescent. No acute luminal abnormality of the imaged aorta within the limitations of the suboptimally opacified exam. No periaortic stranding or hemorrhage. Normal 3 vessel branching of the aortic arch with calcifications in  the proximal great vessels. No major venous abnormalities. Mediastinum/Nodes: No mediastinal fluid or gas. Normal thyroid gland and thoracic inlet. No acute abnormality of the trachea or esophagus. No worrisome mediastinal, hilar or axillary adenopathy. Lungs/Pleura: Low volumes and atelectasis on water likely mild emphysematous changes. Some additional vascular redistribution, septal thickening is noted as well, can reflect mild interstitial edema. Trace pleural effusions, right greater than left, with dependent and passive atelectatic changes in the lung bases. No concerning pulmonary nodules or masses. Additional bandlike areas of opacity likely reflect scarring and or atelectasis. Musculoskeletal: Multilevel degenerative changes are present in the imaged portions of the spine. Likely remote anterior wedging deformities T10, T12, unchanged from comparison. Superimposed Schmorl's node formations and vacuum disc phenomenon. No acute fracture or or conspicuous osseous abnormality is seen. Mild body wall edema. No worrisome chest wall mass or lesion. Benign macro calcifications seen in the breast tissues, suspect many of which are mass killer. Review of the MIP images confirms the above findings. CT ABDOMEN and PELVIS FINDINGS Hepatobiliary: No worrisome focal liver lesions. Smooth liver surface contour. Normal hepatic attenuation. Gallbladder decompressed. No pericholecystic fluid or inflammation. No visible calcified gallstone or biliary ductal  dilatation. Pancreas: No pancreatic ductal dilatation or surrounding inflammatory changes. Spleen: Normal in size. No concerning splenic lesions. Adrenals/Urinary Tract: Normal adrenals. Kidneys enhance and excrete symmetrically. Bilateral extrarenal pelves. Question some mild bladder wall hyperemia and urothelial thickening which could reflect ascending infection in the appropriate clinical context. Additionally, there is a punctate radiodensity layering in the left posterolateral bladder, separate from the ureterovesicular junction, suggestive of a layering bladder calculus. Stomach/Bowel: Extensive vascular collateralization in varices formation about the distal esophagus and proximal stomach. Percutaneous gastrostomy tube in place with adequate pexy of the anterior wall of the gastric body to the anterior abdominal wall. No acute complication. Normally seated balloon. Duodenum is unremarkable accounting for distension. No small bowel thickening or dilatation is seen. High attenuation enteric contrast media traverses through the colon to the level of the rectal vault, lack of formed stool, can be seen with rapid transit state. Scattered colonic diverticula without focal inflammation to suggest diverticulitis. 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 Atherosclerotic calcifications within the abdominal aorta and branch vessels. No other aneurysm or ectasia. No enlarged abdominopelvic lymph nodes. Reproductive: Anteverted uterus. No concerning adnexal lesions. There is slight asymmetric prominence of the left gonadal vein and parametrial vasculature, nonspecific though can be seen in the setting of pelvic congestion. Other: Stranding and inflammatory changes appears centered upon the right psoas musculature with some additional hazy stranding distributed in the retroperitoneum. Possibly reactive change. No free air. No bowel containing hernia.  Musculoskeletal: Interval contraction of the previously expanded right psoas musculature albeit with a persistent hypoattenuating focus centrally within the muscle belly (6/45, 3/53), can reflect a residual intramuscular hematoma or abscess in the setting of sepsis. No other intramuscular collection is seen. Multilevel degenerative changes are noted in the lumbar levels. Levocurvature apex L2-3. Associated pelvic tilt. Degenerative changes in the hips and pelvis as well. No acute or conspicuous osseous lesions. Review of the MIP images confirms the above findings. IMPRESSION: 1. No evidence of acute pulmonary embolism though respiratory motion may limit detection of smaller segmental and subsegmental filling defects. 2. Enlarged central pulmonary arteries, suggestive of pulmonary arterial hypertension. 3. Trace pleural effusions, right greater than left, with dependent and passive atelectatic changes in the lung bases. 4. Question some mild bladder wall hyperemia and  urothelial thickening which could reflect ascending infection in the appropriate clinical context. Correlate with urinalysis. 5. Interval contraction of the previously expanded right psoas musculature albeit with a persistent hypoattenuating focus centrally within the muscle belly, can reflect a residual intramuscular hematoma or abscess in the setting of sepsis. Stranding and inflammatory changes appears centered upon the right psoas musculature with some additional hazy stranding distributed in the retroperitoneum, which could reflect reactive change. 6. High attenuation enteric contrast media traverses through the colon to the level of the rectal vault, lack of formed stool, can be seen with rapid transit state. 7. Dilatation of the aortic arch to 3.6 cm. Recommend annual imaging followup by CTA or MRA. This recommendation follows 2010 ACCF/AHA/AATS/ACR/ASA/SCA/SCAI/SIR/STS/SVM Guidelines for the Diagnosis and Management of Patients with Thoracic  Aortic Disease. Circulation.2010; 121: V400-Q676. Aortic aneurysm NOS (ICD10-I71.9) 8. 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 Recommend follow-up ultrasound every 3 years. This recommendation follows ACR consensus guidelines: White Paper of the ACR Incidental Findings Committee II on Vascular Findings. J Am Coll Radiol 2013; 10:789-794. 9. Aortic Atherosclerosis (ICD10-I70.0). 10. Coronary artery calcifications are present. Please note that the presence of coronary artery calcium documents the presence of coronary artery disease, the severity of this disease and any potential stenosis cannot be assessed on this non-gated CT examination. Electronically Signed   By: Kreg Shropshire M.D.   On: 01/07/2021 19:57   CT ABDOMEN PELVIS W CONTRAST  Result Date: 01/07/2021 CLINICAL DATA:  Hypoxemia and bacteremia EXAM: CT ANGIOGRAPHY CHEST CT ABDOMEN AND PELVIS WITH CONTRAST TECHNIQUE: Multidetector CT imaging of the chest was performed using the standard protocol during bolus administration of intravenous contrast. Multiplanar CT image reconstructions and MIPs were obtained to evaluate the vascular anatomy. Multidetector CT imaging of the abdomen and pelvis was performed using the standard protocol during bolus administration of intravenous contrast. CONTRAST:  80mL OMNIPAQUE IOHEXOL 350 MG/ML SOLN COMPARISON:  Ultrasound 01/07/2021 radiograph 01/06/2021 CT 1222 FINDINGS: CTA CHEST FINDINGS Cardiovascular: Satisfactory opacification of pulmonary arteries. No visible pulmonary arterial filling defects though respiratory motion may limit detection of smaller segmental and subsegmental filling defects. Central pulmonary arteries are enlarged though similar to prior caliber. Cardiac size is within normal limits. No pericardial effusion. Three-vessel coronary artery atherosclerosis. The aortic root is suboptimally assessed given cardiac pulsation artifact. Atherosclerotic plaque throughout the  thoracic aorta. Dilatation of the distal thoracic aortic arch to 3.6 cm, returning to a more normal caliber by the level of the diaphragmatic hiatus of 2.9 cm. Mild aortic tortuosity, can be senescent. No acute luminal abnormality of the imaged aorta within the limitations of the suboptimally opacified exam. No periaortic stranding or hemorrhage. Normal 3 vessel branching of the aortic arch with calcifications in the proximal great vessels. No major venous abnormalities. Mediastinum/Nodes: No mediastinal fluid or gas. Normal thyroid gland and thoracic inlet. No acute abnormality of the trachea or esophagus. No worrisome mediastinal, hilar or axillary adenopathy. Lungs/Pleura: Low volumes and atelectasis on water likely mild emphysematous changes. Some additional vascular redistribution, septal thickening is noted as well, can reflect mild interstitial edema. Trace pleural effusions, right greater than left, with dependent and passive atelectatic changes in the lung bases. No concerning pulmonary nodules or masses. Additional bandlike areas of opacity likely reflect scarring and or atelectasis. Musculoskeletal: Multilevel degenerative changes are present in the imaged portions of the spine. Likely remote anterior wedging deformities T10, T12, unchanged from comparison. Superimposed Schmorl's node formations and vacuum disc phenomenon. No acute fracture or  or conspicuous osseous abnormality is seen. Mild body wall edema. No worrisome chest wall mass or lesion. Benign macro calcifications seen in the breast tissues, suspect many of which are mass killer. Review of the MIP images confirms the above findings. CT ABDOMEN and PELVIS FINDINGS Hepatobiliary: No worrisome focal liver lesions. Smooth liver surface contour. Normal hepatic attenuation. Gallbladder decompressed. No pericholecystic fluid or inflammation. No visible calcified gallstone or biliary ductal dilatation. Pancreas: No pancreatic ductal dilatation or  surrounding inflammatory changes. Spleen: Normal in size. No concerning splenic lesions. Adrenals/Urinary Tract: Normal adrenals. Kidneys enhance and excrete symmetrically. Bilateral extrarenal pelves. Question some mild bladder wall hyperemia and urothelial thickening which could reflect ascending infection in the appropriate clinical context. Additionally, there is a punctate radiodensity layering in the left posterolateral bladder, separate from the ureterovesicular junction, suggestive of a layering bladder calculus. Stomach/Bowel: Extensive vascular collateralization in varices formation about the distal esophagus and proximal stomach. Percutaneous gastrostomy tube in place with adequate pexy of the anterior wall of the gastric body to the anterior abdominal wall. No acute complication. Normally seated balloon. Duodenum is unremarkable accounting for distension. No small bowel thickening or dilatation is seen. High attenuation enteric contrast media traverses through the colon to the level of the rectal vault, lack of formed stool, can be seen with rapid transit state. Scattered colonic diverticula without focal inflammation to suggest diverticulitis. 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 Atherosclerotic calcifications within the abdominal aorta and branch vessels. No other aneurysm or ectasia. No enlarged abdominopelvic lymph nodes. Reproductive: Anteverted uterus. No concerning adnexal lesions. There is slight asymmetric prominence of the left gonadal vein and parametrial vasculature, nonspecific though can be seen in the setting of pelvic congestion. Other: Stranding and inflammatory changes appears centered upon the right psoas musculature with some additional hazy stranding distributed in the retroperitoneum. Possibly reactive change. No free air. No bowel containing hernia. Musculoskeletal: Interval contraction of the previously expanded  right psoas musculature albeit with a persistent hypoattenuating focus centrally within the muscle belly (6/45, 3/53), can reflect a residual intramuscular hematoma or abscess in the setting of sepsis. No other intramuscular collection is seen. Multilevel degenerative changes are noted in the lumbar levels. Levocurvature apex L2-3. Associated pelvic tilt. Degenerative changes in the hips and pelvis as well. No acute or conspicuous osseous lesions. Review of the MIP images confirms the above findings. IMPRESSION: 1. No evidence of acute pulmonary embolism though respiratory motion may limit detection of smaller segmental and subsegmental filling defects. 2. Enlarged central pulmonary arteries, suggestive of pulmonary arterial hypertension. 3. Trace pleural effusions, right greater than left, with dependent and passive atelectatic changes in the lung bases. 4. Question some mild bladder wall hyperemia and urothelial thickening which could reflect ascending infection in the appropriate clinical context. Correlate with urinalysis. 5. Interval contraction of the previously expanded right psoas musculature albeit with a persistent hypoattenuating focus centrally within the muscle belly, can reflect a residual intramuscular hematoma or abscess in the setting of sepsis. Stranding and inflammatory changes appears centered upon the right psoas musculature with some additional hazy stranding distributed in the retroperitoneum, which could reflect reactive change. 6. High attenuation enteric contrast media traverses through the colon to the level of the rectal vault, lack of formed stool, can be seen with rapid transit state. 7. Dilatation of the aortic arch to 3.6 cm. Recommend annual imaging followup by CTA or MRA. This recommendation follows 2010 ACCF/AHA/AATS/ACR/ASA/SCA/SCAI/SIR/STS/SVM Guidelines for the Diagnosis and Management  of Patients with Thoracic Aortic Disease. Circulation.2010; 121: O962-X528. Aortic aneurysm  NOS (ICD10-I71.9) 8. 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 Recommend follow-up ultrasound every 3 years. This recommendation follows ACR consensus guidelines: White Paper of the ACR Incidental Findings Committee II on Vascular Findings. J Am Coll Radiol 2013; 10:789-794. 9. Aortic Atherosclerosis (ICD10-I70.0). 10. Coronary artery calcifications are present. Please note that the presence of coronary artery calcium documents the presence of coronary artery disease, the severity of this disease and any potential stenosis cannot be assessed on this non-gated CT examination. Electronically Signed   By: Kreg Shropshire M.D.   On: 01/07/2021 19:57   IR GASTROSTOMY TUBE MOD SED  Result Date: 12/18/2020 INDICATION: 80 year old female referred for gastrostomy placement EXAM: PERC PLACEMENT GASTROSTOMY MEDICATIONS: 2 g Ancef; Antibiotics were administered within 1 hour of the procedure. Scratch it ANESTHESIA/SEDATION: Versed 1.0 mg IV; Fentanyl 50 mcg IV Moderate Sedation Time:  10 minutes The patient was continuously monitored during the procedure by the interventional radiology nurse under my direct supervision. CONTRAST:  10mL OMNIPAQUE IOHEXOL 300 MG/ML SOLN - administered into the gastric lumen. FLUOROSCOPY TIME:  Fluoroscopy Time: 2 minutes 48 seconds (9 mGy). COMPLICATIONS: None PROCEDURE: Informed written consent was obtained from the patient and the patient's family after a thorough discussion of the procedural risks, benefits and alternatives. All questions were addressed. Maximal Sterile Barrier Technique was utilized including caps, mask, sterile gowns, sterile gloves, sterile drape, hand hygiene and skin antiseptic. A timeout was performed prior to the initiation of the procedure. The epigastrium was prepped with Betadine in a sterile fashion, and a sterile drape was applied covering the operative field. A sterile gown and sterile gloves were used for the procedure. A 5-French  orogastric tube is placed under fluoroscopic guidance. Scout imaging of the abdomen confirms barium within the transverse colon. The stomach was distended with gas. Under fluoroscopic guidance, an 18 gauge needle was utilized to puncture the anterior wall of the body of the stomach. An Amplatz wire was advanced through the needle passing a T fastener into the lumen of the stomach. The T fastener was secured for gastropexy. A 9-French sheath was inserted. A snare was advanced through the 9-French sheath. A Teena Dunk was advanced through the orogastric tube. It was snared then pulled out the oral cavity, pulling the snare, as well. The leading edge of the gastrostomy was attached to the snare. It was then pulled down the esophagus and out the percutaneous site. Tube secured in place. Contrast was injected. Patient tolerated the procedure well and remained hemodynamically stable throughout. No complications were encountered and no significant blood loss encountered. IMPRESSION: Status post fluoroscopic placed percutaneous gastrostomy tube, with 20 [place] pull-through. Signed, Yvone Neu. Loreta Ave, DO [facility] Radiology Electronically Signed   By: Gilmer Mor D.O.   On: 12/18/2020 11:11   DG Chest Port 1 View  Result Date: 01/06/2021 CLINICAL DATA:  Questionable sepsis EXAM: PORTABLE CHEST 1 VIEW COMPARISON:  12/10/2020 FINDINGS: Low volume chest although better than before. Streaky/Reticular densities at the bases which are also improved. Unremarkable heart size and mediastinal contours when allowing for marked distortion from rotation. No visible effusion or pneumothorax. IMPRESSION: 1. Limited low volume chest. 2. Probable atelectasis at the bases. Aeration is improved from the most recent comparison in February. Electronically Signed   By: Marnee Spring M.D.   On: 01/06/2021 04:44   [place] ASCITES (ABDOMEN LIMITED)  Result Date: 01/07/2021 CLINICAL DATA:  Evaluate  for ascites. EXAM: LIMITED ABDOMEN ULTRASOUND FOR ASCITES TECHNIQUE: Limited ultrasound survey for ascites was performed in all four abdominal quadrants. COMPARISON:  None. FINDINGS: No ascites identified today. IMPRESSION: No ascites identified. Electronically Signed   By: Gerome Sam III M.D   On: 01/07/2021 11:01      Procedures: endoscopy with banding varices  Subjective: Patient denies any pain or dyspnea, no nausea or vomiting, no chest pain.  Discharge Exam: Vitals:   01/12/21 0156 01/12/21 0433  BP: (!) 109/48 132/61  Pulse: 78 80  Resp: 18 16  Temp: 98 F (36.7 C) 98.6 F (37 C)  SpO2: 98% 93%   Vitals:   01/11/21 1137 01/11/21 1914 01/12/21 0156 01/12/21 0433  BP: (!) 123/59 126/61 (!) 109/48 132/61  Pulse: 82 92 78 80  Resp: Temp: 98 F (36.7 C) 99 F (37.2 C) 98 F (36.7 C) 98.6 F (37 C)  TempSrc: Oral Oral Oral Oral  SpO2: 97% 97% 98% 93%  Weight:   68.4 kg   Height:        General: Not in pain or dyspnea, deconditioned  Neurology: Awake and alert, non focal  E ENT: no pallor, no icterus, oral mucosa moist Cardiovascular: No JVD. S1-S2 present, rhythmic, no gallops, rubs, or murmurs. No lower extremity edema. Pulmonary: positive breath sounds bilaterally, adequate air movement, no wheezing, rhonchi or rales. Gastrointestinal. Abdomen soft and non tender Skin. No rashes Musculoskeletal: no joint deformities  Patient looks in discomfort but when distracted her condition improves. Possible central related discomfort.    The results of significant diagnostics from this hospitalization (including imaging, microbiology, ancillary and laboratory) are listed below for reference.     Microbiology: Recent Results (from the past 240 hour(s))  Urine culture     Status: Abnormal   Collection Time: 01/06/21  3:32 AM   Specimen: In/Out Cath Urine  Result Value Ref Range Status   Specimen Description IN/OUT CATH URINE  Final   Special Requests    Final    NONE Performed at [facility], [address] [place], [place] [address]    Culture 20,000 COLONIES/mL PSEUDOMONAS AERUGINOSA (A)  Final   Report Status 01/08/2021 FINAL  Final   Organism ID, Bacteria PSEUDOMONAS AERUGINOSA (A)  Final      Susceptibility   Pseudomonas aeruginosa - MIC*    CEFTAZIDIME >=64 RESISTANT Resistant     CIPROFLOXACIN <=0.25 SENSITIVE Sensitive     GENTAMICIN 2 SENSITIVE Sensitive     IMIPENEM 2 SENSITIVE Sensitive     * 20,000 COLONIES/mL PSEUDOMONAS AERUGINOSA  Blood Culture (routine x 2)     Status: Abnormal   Collection Time: 01/06/21  3:50 AM   Specimen: BLOOD  Result Value Ref Range Status   Specimen Description BLOOD SITE NOT SPECIFIED  Final   Special Requests   Final    BOTTLES DRAWN AEROBIC AND ANAEROBIC Blood Culture results may not be optimal due to an inadequate volume of blood received in culture bottles   Culture  Setup Time   Final    GRAM POSITIVE COCCI IN CHAINS IN PAIRS GRAM NEGATIVE RODS ANAEROBIC BOTTLE ONLY CRITICAL RESULT CALLED TO, READ BACK BY AND VERIFIED WITH: Malva Cogan PHARMD  01/06/21 EB Performed at [facility], [address], [place], [place] [address]    Culture PROTEUS MIRABILIS ENTEROCOCCUS FAECALIS  (A)  Final   Report Status 01/11/2021 FINAL  Final   Organism ID,  Bacteria PROTEUS MIRABILIS  Final   Organism ID, Bacteria ENTEROCOCCUS FAECALIS  Final      Susceptibility   Enterococcus faecalis - MIC*    AMPICILLIN <=2 SENSITIVE Sensitive     VANCOMYCIN 1 SENSITIVE Sensitive     GENTAMICIN SYNERGY SENSITIVE Sensitive     * ENTEROCOCCUS FAECALIS   Proteus mirabilis - MIC*    AMPICILLIN <=2 SENSITIVE Sensitive     CEFAZOLIN <=4 SENSITIVE Sensitive     CEFEPIME <=0.12 SENSITIVE Sensitive     CEFTAZIDIME <=1 SENSITIVE Sensitive     CEFTRIAXONE <=0.25 SENSITIVE Sensitive     CIPROFLOXACIN <=0.25 SENSITIVE Sensitive     GENTAMICIN <=1 SENSITIVE Sensitive     IMIPENEM 2 SENSITIVE  Sensitive     TRIMETH/SULFA <=20 SENSITIVE Sensitive     AMPICILLIN/SULBACTAM <=2 SENSITIVE Sensitive     PIP/TAZO <=4 SENSITIVE Sensitive     * PROTEUS MIRABILIS  Blood Culture ID Panel (Reflexed)     Status: Abnormal   Collection Time: 01/06/21  3:50 AM  Result Value Ref Range Status   Enterococcus faecalis DETECTED (A) NOT DETECTED Final    Comment: CRITICAL RESULT CALLED TO, READ BACK BY AND VERIFIED WITH: KIM HURTH PHARMD @1910  01/06/21 EB    Enterococcus Faecium DETECTED (A) NOT DETECTED Final    Comment: CRITICAL RESULT CALLED TO, READ BACK BY AND VERIFIED WITH: KIM HURTH PHARMD @1910  01/06/21 EB    Listeria monocytogenes NOT DETECTED NOT DETECTED Final   Staphylococcus species NOT DETECTED NOT DETECTED Final   Staphylococcus aureus (BCID) NOT DETECTED NOT DETECTED Final   Staphylococcus epidermidis NOT DETECTED NOT DETECTED Final   Staphylococcus lugdunensis NOT DETECTED NOT DETECTED Final   Streptococcus species DETECTED (A) NOT DETECTED Final    Comment: CRITICAL RESULT CALLED TO, READ BACK BY AND VERIFIED WITH: KIM HURTH PHARMD @1910  01/06/21 EB    Streptococcus agalactiae DETECTED (A) NOT DETECTED Final    Comment: CRITICAL RESULT CALLED TO, READ BACK BY AND VERIFIED WITH: Malva Cogan PHARMD @1910  01/06/21 EB    Streptococcus pneumoniae NOT DETECTED NOT DETECTED Final   Streptococcus pyogenes NOT DETECTED NOT DETECTED Final   A.calcoaceticus-baumannii NOT DETECTED NOT DETECTED Final   Bacteroides fragilis NOT DETECTED NOT DETECTED Final   Enterobacterales DETECTED (A) NOT DETECTED Final    Comment: Enterobacterales represent a large order of gram negative bacteria, not a single organism. CRITICAL RESULT CALLED TO, READ BACK BY AND VERIFIED WITH: Malva Cogan PHARMD @1910  01/06/21 EB    Enterobacter cloacae complex NOT DETECTED NOT DETECTED Final   Escherichia coli NOT DETECTED NOT DETECTED Final   Klebsiella aerogenes NOT DETECTED NOT DETECTED Final   Klebsiella oxytoca NOT  DETECTED NOT DETECTED Final   Klebsiella pneumoniae NOT DETECTED NOT DETECTED Final   Proteus species DETECTED (A) NOT DETECTED Final    Comment: CRITICAL RESULT CALLED TO, READ BACK BY AND VERIFIED WITH: KIM HURTH PHARMD @1910  01/06/21 EB    Salmonella species NOT DETECTED NOT DETECTED Final   Serratia marcescens NOT DETECTED NOT DETECTED Final   Haemophilus influenzae NOT DETECTED NOT DETECTED Final   Neisseria meningitidis NOT DETECTED NOT DETECTED Final   Pseudomonas aeruginosa NOT DETECTED NOT DETECTED Final   Stenotrophomonas maltophilia NOT DETECTED NOT DETECTED Final   Candida albicans NOT DETECTED NOT DETECTED Final   Candida auris NOT DETECTED NOT DETECTED Final   Candida glabrata NOT DETECTED NOT DETECTED Final   Candida krusei NOT DETECTED NOT DETECTED Final   Candida parapsilosis NOT DETECTED NOT  DETECTED Final   Candida tropicalis NOT DETECTED NOT DETECTED Final   Cryptococcus neoformans/gattii NOT DETECTED NOT DETECTED Final   CTX-M ESBL NOT DETECTED NOT DETECTED Final   Carbapenem resistance IMP NOT DETECTED NOT DETECTED Final   Carbapenem resistance KPC NOT DETECTED NOT DETECTED Final   Carbapenem resistance NDM NOT DETECTED NOT DETECTED Final   Carbapenem resist OXA 48 LIKE NOT DETECTED NOT DETECTED Final   Vancomycin resistance DETECTED (A) NOT DETECTED Final    Comment: CRITICAL RESULT CALLED TO, READ BACK BY AND VERIFIED WITH: Malva Cogan PHARMD @1910  01/06/21 EB    Carbapenem resistance VIM NOT DETECTED NOT DETECTED Final    Comment: Performed at [facility], [address] [place], [place] [address]  Blood Culture (routine x 2)     Status: None   Collection Time: 01/06/21  3:52 AM   Specimen: BLOOD  Result Value Ref Range Status   Specimen Description BLOOD SITE NOT SPECIFIED  Final   Special Requests   Final    BOTTLES DRAWN AEROBIC AND ANAEROBIC Blood Culture adequate volume   Culture   Final    NO GROWTH 5 DAYS Performed at [facility],  [address] [place], [place] [address]    Report Status 01/11/2021 FINAL  Final  Resp Panel by RT-PCR (Flu A&B, Covid) Nasopharyngeal Swab     Status: None   Collection Time: 01/06/21  4:37 AM   Specimen: Nasopharyngeal Swab; Nasopharyngeal(NP) swabs in vial transport medium  Result Value Ref Range Status   SARS Coronavirus 2 by RT PCR NEGATIVE NEGATIVE Final    Comment: (NOTE) SARS-CoV-2 target nucleic acids are NOT DETECTED.  The SARS-CoV-2 RNA is generally detectable in upper respiratory specimens during the acute phase of infection. The lowest concentration of SARS-CoV-2 viral copies this assay can detect is 138 copies/mL. A negative result does not preclude SARS-Cov-2 infection and should not be used as the sole basis for treatment or other patient management decisions. A negative result may occur with  improper specimen collection/handling, submission of specimen other than nasopharyngeal swab, presence of viral mutation(s) within the areas targeted by this assay, and inadequate number of viral copies(<138 copies/mL). A negative result must be combined with clinical observations, patient history, and epidemiological information. The expected result is Negative.  Fact Sheet for Patients:  BloggerCourse.com  Fact Sheet for Healthcare Providers:  SeriousBroker.it  This test is no t yet approved or cleared by the [place] FDA and  has been authorized for detection and/or diagnosis of SARS-CoV-2 by FDA under an Emergency Use Authorization (EUA). This EUA will remain  in effect (meaning this test can be used) for the duration of the COVID-19 declaration under Section 564(b)(1) of the Act, 21 U.S.C.section 360bbb-3(b)(1), unless the authorization is terminated  or revoked sooner.       Influenza A by PCR NEGATIVE NEGATIVE Final   Influenza B by PCR NEGATIVE NEGATIVE Final    Comment: (NOTE) The Xpert Xpress  SARS-CoV-2/FLU/RSV plus assay is intended as an aid in the diagnosis of influenza from Nasopharyngeal swab specimens and should not be used as a sole basis for treatment. Nasal washings and aspirates are unacceptable for Xpert Xpress SARS-CoV-2/FLU/RSV testing.  Fact Sheet for Patients: BloggerCourse.com  Fact Sheet for Healthcare Providers: SeriousBroker.it  This test is not yet approved or cleared by the [place] FDA and has been authorized for detection and/or diagnosis of SARS-CoV-2 by FDA under an Emergency Use Authorization (EUA). This EUA  will remain in effect (meaning this test can be used) for the duration of the COVID-19 declaration under Section 564(b)(1) of the Act, 21 U.S.C. section 360bbb-3(b)(1), unless the authorization is terminated or revoked.  Performed at [facility], [address], [place], [place] [address]   Culture, blood (routine x 2)     Status: Abnormal   Collection Time: 01/07/21 12:14 PM   Specimen: BLOOD  Result Value Ref Range Status   Specimen Description BLOOD SITE NOT SPECIFIED  Final   Special Requests   Final    BOTTLES DRAWN AEROBIC ONLY Blood Culture results may not be optimal due to an inadequate volume of blood received in culture bottles   Culture  Setup Time   Final    AEROBIC BOTTLE ONLY GRAM POSITIVE COCCI CRITICAL RESULT CALLED TO, READ BACK BY AND VERIFIED WITH: L SEAY PHARMD 01/09/21 0344 JDW    Culture (A)  Final    STAPHYLOCOCCUS EPIDERMIDIS THE SIGNIFICANCE OF ISOLATING THIS ORGANISM FROM A SINGLE SET OF BLOOD CULTURES WHEN MULTIPLE SETS ARE DRAWN IS UNCERTAIN. PLEASE NOTIFY THE MICROBIOLOGY DEPARTMENT WITHIN ONE WEEK IF SPECIATION AND SENSITIVITIES ARE REQUIRED. Performed at [facility], [address], [place], [place] [address]    Report Status 01/11/2021 FINAL  Final  Culture, blood (routine x 2)     Status: None (Preliminary result)   Collection Time:  01/07/21 12:14 PM   Specimen: BLOOD  Result Value Ref Range Status   Specimen Description BLOOD SITE NOT SPECIFIED  Final   Special Requests   Final    BOTTLES DRAWN AEROBIC ONLY Blood Culture results may not be optimal due to an inadequate volume of blood received in culture bottles   Culture   Final    NO GROWTH 4 DAYS Performed at [facility], [address], [place], [place] [address]    Report Status PENDING  Incomplete  Blood Culture ID Panel (Reflexed)     Status: Abnormal   Collection Time: 01/07/21 12:14 PM  Result Value Ref Range Status   Enterococcus faecalis NOT DETECTED NOT DETECTED Final   Enterococcus Faecium NOT DETECTED NOT DETECTED Final   Listeria monocytogenes NOT DETECTED NOT DETECTED Final   Staphylococcus species DETECTED (A) NOT DETECTED Final    Comment: CRITICAL RESULT CALLED TO, READ BACK BY AND VERIFIED WITH: L SEAY PHARMD 01/09/21 0344 JDW    Staphylococcus aureus (BCID) NOT DETECTED NOT DETECTED Final   Staphylococcus epidermidis DETECTED (A) NOT DETECTED Final    Comment: Methicillin (oxacillin) resistant coagulase negative staphylococcus. Possible blood culture contaminant (unless isolated from more than one blood culture draw or clinical case suggests pathogenicity). No antibiotic treatment is indicated for blood  culture contaminants. CRITICAL RESULT CALLED TO, READ BACK BY AND VERIFIED WITH: L SEAY PHARMD 01/09/21 0344 JDW    Staphylococcus lugdunensis NOT DETECTED NOT DETECTED Final   Streptococcus species NOT DETECTED NOT DETECTED Final   Streptococcus agalactiae NOT DETECTED NOT DETECTED Final   Streptococcus pneumoniae NOT DETECTED NOT DETECTED Final   Streptococcus pyogenes NOT DETECTED NOT DETECTED Final   A.calcoaceticus-baumannii NOT DETECTED NOT DETECTED Final   Bacteroides fragilis NOT DETECTED NOT DETECTED Final   Enterobacterales NOT DETECTED NOT DETECTED Final   Enterobacter cloacae complex NOT DETECTED NOT DETECTED Final    Escherichia coli NOT DETECTED NOT DETECTED Final   Klebsiella aerogenes NOT DETECTED NOT DETECTED Final   Klebsiella oxytoca NOT DETECTED NOT DETECTED Final   Klebsiella pneumoniae NOT DETECTED  NOT DETECTED Final   Proteus species NOT DETECTED NOT DETECTED Final   Salmonella species NOT DETECTED NOT DETECTED Final   Serratia marcescens NOT DETECTED NOT DETECTED Final   Haemophilus influenzae NOT DETECTED NOT DETECTED Final   Neisseria meningitidis NOT DETECTED NOT DETECTED Final   Pseudomonas aeruginosa NOT DETECTED NOT DETECTED Final   Stenotrophomonas maltophilia NOT DETECTED NOT DETECTED Final   Candida albicans NOT DETECTED NOT DETECTED Final   Candida auris NOT DETECTED NOT DETECTED Final   Candida glabrata NOT DETECTED NOT DETECTED Final   Candida krusei NOT DETECTED NOT DETECTED Final   Candida parapsilosis NOT DETECTED NOT DETECTED Final   Candida tropicalis NOT DETECTED NOT DETECTED Final   Cryptococcus neoformans/gattii NOT DETECTED NOT DETECTED Final   Methicillin resistance mecA/C DETECTED (A) NOT DETECTED Final    Comment: CRITICAL RESULT CALLED TO, READ BACK BY AND VERIFIED WITH: L SEAY PHARMD 01/09/21 0344 JDW Performed at [facility], [address], [place], [place] [address]      Labs: BNP (last 3 results) Recent Labs    11/27/20 0523 12/06/20 1249  BNP 455.7* 231.5*   Basic Metabolic Panel: Recent Labs  Lab 01/08/21 0432 01/09/21 0340 01/10/21 0319 01/11/21 0307 01/12/21 0241  NA 134* 135 137 136 134*  K 4.2 4.9 4.7 4.8 4.8  CL 106 105 106 105 104  CO2 18* 22 22 25 23   GLUCOSE 126* 127* 119* 118* 106*  BUN 18 15 16 17  25*  CREATININE 0.87 0.72 0.71 0.67 0.69  CALCIUM 8.2* 8.1* 8.3* 8.6* 8.4*   Liver Function Tests: Recent Labs  Lab 01/06/21 0332 01/07/21 0300 01/08/21 0432 01/10/21 0319  AST 63* 52* 50* 81*  ALT 57* 42 44 64*  ALKPHOS 289* 211* 206* 166*  BILITOT 1.1 1.0 0.7 0.7  PROT 7.2 6.5 6.7 6.6  ALBUMIN 2.3* 2.1* 2.1* 2.2*    No results for input(s): LIPASE, AMYLASE in the last 168 hours. Recent Labs  Lab 01/06/21 0436 01/08/21 0432 01/11/21 0307  AMMONIA 29 31 18    CBC: Recent Labs  Lab 01/06/21 0332 01/06/21 1211 01/08/21 0432 01/09/21 0340 01/10/21 0319 01/11/21 0307 01/12/21 0241  WBC 16.7*   < > 6.9 6.0 6.4 5.1 7.4  NEUTROABS 15.7*  --   --   --   --  3.9 5.8  HGB 7.7*   < > 8.1* 8.0* 8.3* 8.2* 7.8*  HCT 26.5*   < > 27.4* 26.9* 28.0* 27.7* 24.8*  MCV 97.1   < > 95.5 94.7 95.9 95.5 92.2  PLT 498*   < > 422* 395 352 345 335   < > = values in this interval not displayed.   Cardiac Enzymes: Recent Labs  Lab 01/07/21 0300  CKTOTAL 34*   BNP: Invalid input(s): POCBNP CBG: Recent Labs  Lab 01/11/21 1402 01/11/21 2047 01/12/21 0007 01/12/21 0453 01/12/21 0609  GLUCAP 127* 120* 140* 130* 129*   D-Dimer No results for input(s): DDIMER in the last 72 hours. Hgb A1c No results for input(s): HGBA1C in the last 72 hours. Lipid Profile No results for input(s): CHOL, HDL, LDLCALC, TRIG, CHOLHDL, LDLDIRECT in the last 72 hours. Thyroid function studies No results for input(s): TSH, T4TOTAL, T3FREE, THYROIDAB in the last 72 hours.  Invalid input(s): FREET3 Anemia work up No results for input(s): VITAMINB12, FOLATE, FERRITIN, TIBC, IRON, RETICCTPCT in the last 72 hours. Urinalysis    Component Value Date/Time   COLORURINE YELLOW 01/06/2021 0621   APPEARANCEUR HAZY (A)  01/06/2021 0621   LABSPEC 1.013 01/06/2021 0621   PHURINE 6.0 01/06/2021 0621   GLUCOSEU NEGATIVE 01/06/2021 0621   HGBUR NEGATIVE 01/06/2021 0621   BILIRUBINUR NEGATIVE 01/06/2021 0621   KETONESUR NEGATIVE 01/06/2021 0621   PROTEINUR 30 (A) 01/06/2021 0621   NITRITE NEGATIVE 01/06/2021 0621   LEUKOCYTESUR MODERATE (A) 01/06/2021 0621   Sepsis Labs Invalid input(s): PROCALCITONIN,  WBC,  LACTICIDVEN Microbiology Recent Results (from the past 240 hour(s))  Urine culture     Status: Abnormal   Collection Time:  01/06/21  3:32 AM   Specimen: In/Out Cath Urine  Result Value Ref Range Status   Specimen Description IN/OUT CATH URINE  Final   Special Requests   Final    NONE Performed at [facility], [address] [place], [place] [address]    Culture 20,000 COLONIES/mL PSEUDOMONAS AERUGINOSA (A)  Final   Report Status 01/08/2021 FINAL  Final   Organism ID, Bacteria PSEUDOMONAS AERUGINOSA (A)  Final      Susceptibility   Pseudomonas aeruginosa - MIC*    CEFTAZIDIME >=64 RESISTANT Resistant     CIPROFLOXACIN <=0.25 SENSITIVE Sensitive     GENTAMICIN 2 SENSITIVE Sensitive     IMIPENEM 2 SENSITIVE Sensitive     * 20,000 COLONIES/mL PSEUDOMONAS AERUGINOSA  Blood Culture (routine x 2)     Status: Abnormal   Collection Time: 01/06/21  3:50 AM   Specimen: BLOOD  Result Value Ref Range Status   Specimen Description BLOOD SITE NOT SPECIFIED  Final   Special Requests   Final    BOTTLES DRAWN AEROBIC AND ANAEROBIC Blood Culture results may not be optimal due to an inadequate volume of blood received in culture bottles   Culture  Setup Time   Final    GRAM POSITIVE COCCI IN CHAINS IN PAIRS GRAM NEGATIVE RODS ANAEROBIC BOTTLE ONLY CRITICAL RESULT CALLED TO, READ BACK BY AND VERIFIED WITH: Malva CoganKIM HURTH PHARMD @1910  01/06/21 EB Performed at [facility], [address] [place], [place] [address]    Culture PROTEUS MIRABILIS ENTEROCOCCUS FAECALIS  (A)  Final   Report Status 01/11/2021 FINAL  Final   Organism ID, Bacteria PROTEUS MIRABILIS  Final   Organism ID, Bacteria ENTEROCOCCUS FAECALIS  Final      Susceptibility   Enterococcus faecalis - MIC*    AMPICILLIN <=2 SENSITIVE Sensitive     VANCOMYCIN 1 SENSITIVE Sensitive     GENTAMICIN SYNERGY SENSITIVE Sensitive     * ENTEROCOCCUS FAECALIS   Proteus mirabilis - MIC*    AMPICILLIN <=2 SENSITIVE Sensitive     CEFAZOLIN <=4 SENSITIVE Sensitive     CEFEPIME <=0.12 SENSITIVE Sensitive     CEFTAZIDIME <=1 SENSITIVE Sensitive      CEFTRIAXONE <=0.25 SENSITIVE Sensitive     CIPROFLOXACIN <=0.25 SENSITIVE Sensitive     GENTAMICIN <=1 SENSITIVE Sensitive     IMIPENEM 2 SENSITIVE Sensitive     TRIMETH/SULFA <=20 SENSITIVE Sensitive     AMPICILLIN/SULBACTAM <=2 SENSITIVE Sensitive     PIP/TAZO <=4 SENSITIVE Sensitive     * PROTEUS MIRABILIS  Blood Culture ID Panel (Reflexed)     Status: Abnormal   Collection Time: 01/06/21  3:50 AM  Result Value Ref Range Status   Enterococcus faecalis DETECTED (A) NOT DETECTED Final    Comment: CRITICAL RESULT CALLED TO, READ BACK BY AND VERIFIED WITH: KIM HURTH PHARMD @1910  01/06/21 EB    Enterococcus Faecium DETECTED (A) NOT DETECTED Final    Comment: CRITICAL RESULT  CALLED TO, READ BACK BY AND VERIFIED WITH: KIM HURTH PHARMD @1910  01/06/21 EB    Listeria monocytogenes NOT DETECTED NOT DETECTED Final   Staphylococcus species NOT DETECTED NOT DETECTED Final   Staphylococcus aureus (BCID) NOT DETECTED NOT DETECTED Final   Staphylococcus epidermidis NOT DETECTED NOT DETECTED Final   Staphylococcus lugdunensis NOT DETECTED NOT DETECTED Final   Streptococcus species DETECTED (A) NOT DETECTED Final    Comment: CRITICAL RESULT CALLED TO, READ BACK BY AND VERIFIED WITH: KIM HURTH PHARMD @1910  01/06/21 EB    Streptococcus agalactiae DETECTED (A) NOT DETECTED Final    Comment: CRITICAL RESULT CALLED TO, READ BACK BY AND VERIFIED WITH: Malva Cogan PHARMD @1910  01/06/21 EB    Streptococcus pneumoniae NOT DETECTED NOT DETECTED Final   Streptococcus pyogenes NOT DETECTED NOT DETECTED Final   A.calcoaceticus-baumannii NOT DETECTED NOT DETECTED Final   Bacteroides fragilis NOT DETECTED NOT DETECTED Final   Enterobacterales DETECTED (A) NOT DETECTED Final    Comment: Enterobacterales represent a large order of gram negative bacteria, not a single organism. CRITICAL RESULT CALLED TO, READ BACK BY AND VERIFIED WITH: Malva Cogan PHARMD @1910  01/06/21 EB    Enterobacter cloacae complex NOT DETECTED NOT  DETECTED Final   Escherichia coli NOT DETECTED NOT DETECTED Final   Klebsiella aerogenes NOT DETECTED NOT DETECTED Final   Klebsiella oxytoca NOT DETECTED NOT DETECTED Final   Klebsiella pneumoniae NOT DETECTED NOT DETECTED Final   Proteus species DETECTED (A) NOT DETECTED Final    Comment: CRITICAL RESULT CALLED TO, READ BACK BY AND VERIFIED WITH: Malva Cogan PHARMD @1910  01/06/21 EB    Salmonella species NOT DETECTED NOT DETECTED Final   Serratia marcescens NOT DETECTED NOT DETECTED Final   Haemophilus influenzae NOT DETECTED NOT DETECTED Final   Neisseria meningitidis NOT DETECTED NOT DETECTED Final   Pseudomonas aeruginosa NOT DETECTED NOT DETECTED Final   Stenotrophomonas maltophilia NOT DETECTED NOT DETECTED Final   Candida albicans NOT DETECTED NOT DETECTED Final   Candida auris NOT DETECTED NOT DETECTED Final   Candida glabrata NOT DETECTED NOT DETECTED Final   Candida krusei NOT DETECTED NOT DETECTED Final   Candida parapsilosis NOT DETECTED NOT DETECTED Final   Candida tropicalis NOT DETECTED NOT DETECTED Final   Cryptococcus neoformans/gattii NOT DETECTED NOT DETECTED Final   CTX-M ESBL NOT DETECTED NOT DETECTED Final   Carbapenem resistance IMP NOT DETECTED NOT DETECTED Final   Carbapenem resistance KPC NOT DETECTED NOT DETECTED Final   Carbapenem resistance NDM NOT DETECTED NOT DETECTED Final   Carbapenem resist OXA 48 LIKE NOT DETECTED NOT DETECTED Final   Vancomycin resistance DETECTED (A) NOT DETECTED Final    Comment: CRITICAL RESULT CALLED TO, READ BACK BY AND VERIFIED WITH: Malva Cogan PHARMD @1910  01/06/21 EB    Carbapenem resistance VIM NOT DETECTED NOT DETECTED Final    Comment: Performed at [facility], [address], [place], [place] [address]  Blood Culture (routine x 2)     Status: None   Collection Time: 01/06/21  3:52 AM   Specimen: BLOOD  Result Value Ref Range Status   Specimen Description BLOOD SITE NOT SPECIFIED  Final   Special Requests   Final     BOTTLES DRAWN AEROBIC AND ANAEROBIC Blood Culture adequate volume   Culture   Final    NO GROWTH 5 DAYS Performed at [facility], [address] [place], [place] [address]    Report Status 01/11/2021 FINAL  Final  Resp Panel by RT-PCR (  Flu A&B, Covid) Nasopharyngeal Swab     Status: None   Collection Time: 01/06/21  4:37 AM   Specimen: Nasopharyngeal Swab; Nasopharyngeal(NP) swabs in vial transport medium  Result Value Ref Range Status   SARS Coronavirus 2 by RT PCR NEGATIVE NEGATIVE Final    Comment: (NOTE) SARS-CoV-2 target nucleic acids are NOT DETECTED.  The SARS-CoV-2 RNA is generally detectable in upper respiratory specimens during the acute phase of infection. The lowest concentration of SARS-CoV-2 viral copies this assay can detect is 138 copies/mL. A negative result does not preclude SARS-Cov-2 infection and should not be used as the sole basis for treatment or other patient management decisions. A negative result may occur with  improper specimen collection/handling, submission of specimen other than nasopharyngeal swab, presence of viral mutation(s) within the areas targeted by this assay, and inadequate number of viral copies(<138 copies/mL). A negative result must be combined with clinical observations, patient history, and epidemiological information. The expected result is Negative.  Fact Sheet for Patients:  BloggerCourse.com  Fact Sheet for Healthcare Providers:  SeriousBroker.it  This test is no t yet approved or cleared by the [place] FDA and  has been authorized for detection and/or diagnosis of SARS-CoV-2 by FDA under an Emergency Use Authorization (EUA). This EUA will remain  in effect (meaning this test can be used) for the duration of the COVID-19 declaration under Section 564(b)(1) of the Act, 21 U.S.C.section 360bbb-3(b)(1), unless the authorization is terminated  or revoked sooner.        Influenza A by PCR NEGATIVE NEGATIVE Final   Influenza B by PCR NEGATIVE NEGATIVE Final    Comment: (NOTE) The Xpert Xpress SARS-CoV-2/FLU/RSV plus assay is intended as an aid in the diagnosis of influenza from Nasopharyngeal swab specimens and should not be used as a sole basis for treatment. Nasal washings and aspirates are unacceptable for Xpert Xpress SARS-CoV-2/FLU/RSV testing.  Fact Sheet for Patients: BloggerCourse.com  Fact Sheet for Healthcare Providers: SeriousBroker.it  This test is not yet approved or cleared by the [place] FDA and has been authorized for detection and/or diagnosis of SARS-CoV-2 by FDA under an Emergency Use Authorization (EUA). This EUA will remain in effect (meaning this test can be used) for the duration of the COVID-19 declaration under Section 564(b)(1) of the Act, 21 U.S.C. section 360bbb-3(b)(1), unless the authorization is terminated or revoked.  Performed at [facility], [address] [place], [place] [address]   Culture, blood (routine x 2)     Status: Abnormal   Collection Time: 01/07/21 12:14 PM   Specimen: BLOOD  Result Value Ref Range Status   Specimen Description BLOOD SITE NOT SPECIFIED  Final   Special Requests   Final    BOTTLES DRAWN AEROBIC ONLY Blood Culture results may not be optimal due to an inadequate volume of blood received in culture bottles   Culture  Setup Time   Final    AEROBIC BOTTLE ONLY GRAM POSITIVE COCCI CRITICAL RESULT CALLED TO, READ BACK BY AND VERIFIED WITH: L SEAY PHARMD 01/09/21 0344 JDW    Culture (A)  Final    STAPHYLOCOCCUS EPIDERMIDIS THE SIGNIFICANCE OF ISOLATING THIS ORGANISM FROM A SINGLE SET OF BLOOD CULTURES WHEN MULTIPLE SETS ARE DRAWN IS UNCERTAIN. PLEASE NOTIFY THE MICROBIOLOGY DEPARTMENT WITHIN ONE WEEK IF SPECIATION AND SENSITIVITIES ARE REQUIRED. Performed at [facility], [address] [place], [place]  [address]    Report Status 01/11/2021 FINAL  Final  Culture, blood (routine x  2)     Status: None (Preliminary result)   Collection Time: 01/07/21 12:14 PM   Specimen: BLOOD  Result Value Ref Range Status   Specimen Description BLOOD SITE NOT SPECIFIED  Final   Special Requests   Final    BOTTLES DRAWN AEROBIC ONLY Blood Culture results may not be optimal due to an inadequate volume of blood received in culture bottles   Culture   Final    NO GROWTH 4 DAYS Performed at [facility], [address], [place], [place] [address]    Report Status PENDING  Incomplete  Blood Culture ID Panel (Reflexed)     Status: Abnormal   Collection Time: 01/07/21 12:14 PM  Result Value Ref Range Status   Enterococcus faecalis NOT DETECTED NOT DETECTED Final   Enterococcus Faecium NOT DETECTED NOT DETECTED Final   Listeria monocytogenes NOT DETECTED NOT DETECTED Final   Staphylococcus species DETECTED (A) NOT DETECTED Final    Comment: CRITICAL RESULT CALLED TO, READ BACK BY AND VERIFIED WITH: L SEAY PHARMD 01/09/21 0344 JDW    Staphylococcus aureus (BCID) NOT DETECTED NOT DETECTED Final   Staphylococcus epidermidis DETECTED (A) NOT DETECTED Final    Comment: Methicillin (oxacillin) resistant coagulase negative staphylococcus. Possible blood culture contaminant (unless isolated from more than one blood culture draw or clinical case suggests pathogenicity). No antibiotic treatment is indicated for blood  culture contaminants. CRITICAL RESULT CALLED TO, READ BACK BY AND VERIFIED WITH: L SEAY PHARMD 01/09/21 0344 JDW    Staphylococcus lugdunensis NOT DETECTED NOT DETECTED Final   Streptococcus species NOT DETECTED NOT DETECTED Final   Streptococcus agalactiae NOT DETECTED NOT DETECTED Final   Streptococcus pneumoniae NOT DETECTED NOT DETECTED Final   Streptococcus pyogenes NOT DETECTED NOT DETECTED Final   A.calcoaceticus-baumannii NOT DETECTED NOT DETECTED Final   Bacteroides fragilis NOT DETECTED NOT  DETECTED Final   Enterobacterales NOT DETECTED NOT DETECTED Final   Enterobacter cloacae complex NOT DETECTED NOT DETECTED Final   Escherichia coli NOT DETECTED NOT DETECTED Final   Klebsiella aerogenes NOT DETECTED NOT DETECTED Final   Klebsiella oxytoca NOT DETECTED NOT DETECTED Final   Klebsiella pneumoniae NOT DETECTED NOT DETECTED Final   Proteus species NOT DETECTED NOT DETECTED Final   Salmonella species NOT DETECTED NOT DETECTED Final   Serratia marcescens NOT DETECTED NOT DETECTED Final   Haemophilus influenzae NOT DETECTED NOT DETECTED Final   Neisseria meningitidis NOT DETECTED NOT DETECTED Final   Pseudomonas aeruginosa NOT DETECTED NOT DETECTED Final   Stenotrophomonas maltophilia NOT DETECTED NOT DETECTED Final   Candida albicans NOT DETECTED NOT DETECTED Final   Candida auris NOT DETECTED NOT DETECTED Final   Candida glabrata NOT DETECTED NOT DETECTED Final   Candida krusei NOT DETECTED NOT DETECTED Final   Candida parapsilosis NOT DETECTED NOT DETECTED Final   Candida tropicalis NOT DETECTED NOT DETECTED Final   Cryptococcus neoformans/gattii NOT DETECTED NOT DETECTED Final   Methicillin resistance mecA/C DETECTED (A) NOT DETECTED Final    Comment: CRITICAL RESULT CALLED TO, READ BACK BY AND VERIFIED WITH: L SEAY PHARMD 01/09/21 0344 JDW Performed at [facility], [address], [place], [place] [address]      Time coordinating discharge: 45 minutes  SIGNED:   Coralie Keens, MD  [facility] 01/12/2021, 10:43 AM

## 2021-01-12 NOTE — Progress Notes (Signed)
[facility] 3E[facility]) Hospital Liaison note:  This is a pending outpatient-based palliative care patient with [facility]. Will continue to follow for disposition.  Please call for any outpatient palliative care related questions or concerns.  Thank you, Abran Cantor, LPN [facility] Liaison [phone]

## 2021-01-12 NOTE — TOC Transition Note (Signed)
Transition of Care [facility]) - CM/SW Discharge Note   Patient Details  Name: Kaylee King MRN: [id number] Date of Birth: 1941/02/18  Transition of Care [facility]) CM/SW Contact:  Lynett Grimes Phone Number: 01/12/2021, 2:56 PM   Clinical Narrative:    2:08pm CSW contacted [facility] for DC and contacted pt daughter to let her know transportation would be about an hour, there was no answer/VM was left.   Final next level of care: Skilled Nursing Facility Barriers to Discharge: Continued Medical Work up   Patient Goals and CMS Choice Patient states their goals for this hospitalization and ongoing recovery are:: Rehab   Choice offered to / list presented to : Patient  Discharge Placement                       Discharge Plan and Services In-house Referral: Clinical Social Work   Post Acute Care Choice: Skilled Nursing Facility          DME Arranged: N/A DME Agency: NA         HH Agency: NA        Social Determinants of Health (SDOH) Interventions     Readmission Risk Interventions No flowsheet data found.

## 2021-01-12 NOTE — Progress Notes (Signed)
D/C instructions printed and placed in packet at nurse's station. IV removed, tolerated well. 

## 2021-01-12 NOTE — Plan of Care (Signed)
  Problem: Safety: Goal: Non-violent Restraint(s) Outcome: Adequate for Discharge   

## 2021-01-12 NOTE — Progress Notes (Signed)
Inpatient Palliative meeting cancelled as patient will be discharged before 3:00 pm.   I spoke with daughter Elita Quick who stated that [facility] was supposed to see Ms. Bagent after the last admission but the visit was not scheduled before she was re-admitted.  Pam expressed a strong interest in having Palliative follow her mother at SNF.  I spoke with [facility] liasion, Wallis Bamberg who agreed to have [facility] Palliative follow up at SNF asap (likely early next week).   Their team will reach out to [facility] as well.   Norvel Richards, PA-C Palliative Medicine Office:  [phone]  No charge

## 2021-01-12 NOTE — Progress Notes (Signed)
Patient discharged at this time with [facility] transport. Transporters are [employer]. Attempted to call report to [facility] No answer. Patient's 2200 meds administered. Discharge packet provided to transporters.

## 2021-01-16 ENCOUNTER — Other Ambulatory Visit: Payer: Self-pay

## 2021-01-16 ENCOUNTER — Non-Acute Institutional Stay: Payer: Self-pay | Admitting: Student

## 2021-01-16 DIAGNOSIS — Z515 Encounter for palliative care: Secondary | ICD-10-CM

## 2021-01-16 NOTE — Progress Notes (Signed)
[facility] Consult Note Telephone: [phone]  Fax: [phone]  PATIENT NAME: Kaylee King [place] [address] [phone] (home) [phone] (work) DOB: [birth year] MRN: [id number]  PRIMARY CARE PROVIDER:    Nolene Ebbs, MD,  [place] [address] [phone]  REFERRING PROVIDER:   Nolene Ebbs, MD [address] [place],  [place] [address] [phone]  RESPONSIBLE PARTY:   Extended Emergency Contact Information Primary Emergency Contact: Rancourt,Pamela Address: [address]          [place] [address] [place] Phone: [phone] Relation: Daughter  I met face to face with patient in facility.  ASSESSMENT AND RECOMMENDATIONS:   Advance Care Planning: Visit at the request of Dr. Westley Gambles for palliative consult. Visit consisted of building trust and discussions on Palliative care medicine as specialized medical care for people living with serious illness, aimed at facilitating improved quality of life through symptoms relief, assisting with advance care planning and establishing goals of care. Education provided on Palliative Medicine vs. Hospice services. Palliative care will continue to provide support to patient, family and the medical team.  Palliative NP will attempt to speak with daughter Jeannene Patella again regarding goals of care; she previously stated she would like for patient to receive therapy, in hopes to return home.  Goal of care: "To be able to return home."  Directives: DNR   Symptom Management:   Pain-patient denies abdominal pain at present. Recommend continuing acetaminophen 646m every 6 hours PRN mild pain, morphine 248m79ml 2.79m66mia PEG tube every 4 hours PRN severe pain.   Shortness of breath-patient with acute hypoxic respiratory failure. She requires oxygen PRN. Recommend continuing oxygen via n/c at 2-3lpm PRN shortness of breath.    Dysphagia-patient receiving all nutrition via PEG tube. Continue Jevity 1.5 ar 41m41m, flushes and free water as directed. Aspiration precautions.   Follow up Palliative Care Visit: Palliative care will continue to follow for complex decision making and symptom management. Return in 4  weeks or prn.  Family /Caregiver/Community Supports: Palliative Medicine will continue to provide support.   I spent 35 minutes providing this consultation, from 10:00am to 10:35am. Time includes time spent with patient/family, chart review, provider coordination, and documentation. More than 50% of the time in this consultation was spent counseling and coordinating communication.   CHIEF COMPLAINT: Palliative Medicine initial consult.  History obtained from review of EMR, discussion with primary team. Records reviewed and summarized below.  HISTORY OF PRESENT ILLNESS:  Kaylee King 80 y75. year old female with multiple medical problems including hypertension, hypothyroidism, NASH w/cirrhosis, portal hypertension, esophageal and gastric varices, AAA, normocytic anemia. Patient recently hospitalized 3/5-3/11/22 due to sepsis secondary to UTI, complicated by right psoas hematoma,? abscess and bacteremia, acute hypoxic respiratory failure d/t atelectasis. Palliative Care was asked to follow this patient by consultation request of Dr. TenjDerinda Latehelp address advance care planning and goals of care. This is an initial visit.  [facility] patient being dependent for all adl's. She is receiving OT/PT. She has a g-tube in place d/t dysphagia; receives all nutrition via PEG-tube. Currently receiving Jevity 1.5 at 41ml37m She does wear oxygen at 2-3 lpm via nasal canula for shortness of breath. Patient denies pain, chest pain, shortness of breath at rest.    CODE STATUS: DNR  PPS: 40%  HOSPICE ELIGIBILITY/DIAGNOSIS: TBD  ROS   General: NAD EYES: denies vision  changes ENMT:  denies dysphagia Cardiovascular: denies chest pain Pulmonary: denies cough, denies increased SOB Abdomen: PEG tube, incontinence of bowel GU: denies dysuria, endorses incontinence of urine MSK: weakness to LE Skin: denies rashes or wounds Neurological: endorses weakness Psych: Endorses positive mood Heme/lymph/immuno: denies bruises, abnormal bleeding   Physical Exam: Pulse 80, respirations 20, b/p 118/70, sats 97% on room 2lpm Constitutional: NAD General: A & O x 1, frail appearing EYES: anicteric sclera, lids intact, no discharge  ENMT: intact hearing,oral mucous membranes dry CV: RRR, no LE edema Pulmonary: LCTA, no increased work of breathing, no cough Abdomen: PEG-tube LUQ, bowel sounds normoactive x 4 GU: deferred MSK: able to move all extremities, no contractures of LE Skin: warm and dry, no rashes or wounds on visible skin Neuro: Generalized weakness, forgetfulness Psych: cooperative, non anxious affect Hem/lymph/immuno: no widespread bruising   PAST MEDICAL HISTORY:  Past Medical History:  Diagnosis Date  . Cirrhosis ([place])   . Polymicrobial bacterial infection 01/08/2021  . Thyroid disease    hypothyroid     SOCIAL HX:  Social History   Tobacco Use  . Smoking status: Former Smoker    Quit date: 12/31/1981    Years since quitting: 39.0  . Smokeless tobacco: Never Used  Substance Use Topics  . Alcohol use: Never   FAMILY HX: No family history on file.  ALLERGIES: No Known Allergies   PERTINENT MEDICATIONS:  Outpatient Encounter Medications as of 01/16/2021  Medication Sig  . acetaminophen (TYLENOL) 325 MG tablet Place 2 tablets (650 mg total) into feeding tube every 6 (six) hours as needed for headache, fever or mild pain.  Marland Kitchen lactulose (CHRONULAC) 10 GM/15ML solution Place 30 mLs (20 g total) into feeding tube 2 (two) times daily.  Marland Kitchen levothyroxine (SYNTHROID) 150 MCG tablet Place 1 tablet (150 mcg total) into feeding tube daily before breakfast.   . morphine 20 MG/5ML solution Place 2.5 mLs (10 mg total) into feeding tube every 4 (four) hours as needed for pain.  . Multiple Vitamin (MULTIVITAMIN WITH MINERALS) TABS tablet Place 1 tablet into feeding tube daily.  Marland Kitchen nystatin ointment (MYCOSTATIN) Apply 1 application topically in the morning and at bedtime. Apply to affected area(s) on groin  . pantoprazole (PROTONIX) 40 MG tablet Take 40 mg by mouth daily. Via tube  . rifaximin (XIFAXAN) 550 MG TABS tablet Place 1 tablet (550 mg total) into feeding tube 2 (two) times daily.  Marland Kitchen spironolactone (ALDACTONE) 25 MG tablet Place 1 tablet (25 mg total) into feeding tube daily.  Marland Kitchen thiamine 100 MG tablet Place 1 tablet (100 mg total) into feeding tube daily.  . Water For Irrigation, Sterile (FREE WATER) SOLN Place 200 mLs into feeding tube every 6 (six) hours.   No facility-administered encounter medications on file as of 01/16/2021.     Thank you for the opportunity to participate in the care of Ms. Wideman. The palliative care team will continue to follow. Please call our office at [phone] if we can be of additional assistance.  Ezekiel Slocumb, NP

## 2021-01-17 ENCOUNTER — Telehealth: Payer: Self-pay | Admitting: Student

## 2021-01-17 NOTE — Telephone Encounter (Signed)
Palliative NP spoke with daughter Rinaldo Cloud regarding Palliative visit yesterday. Reviewed GOC; palliative medicine will continue to follow patient at [facility]. Daughter is encouraged to call with any questions/concerns.

## 2021-01-29 ENCOUNTER — Encounter (HOSPITAL_COMMUNITY): Payer: Self-pay

## 2021-01-29 ENCOUNTER — Other Ambulatory Visit: Payer: Self-pay

## 2021-01-29 ENCOUNTER — Emergency Department (HOSPITAL_COMMUNITY)
Admission: EM | Admit: 2021-01-29 | Discharge: 2021-01-29 | Disposition: A | Discharge status: Home / Self Care | Payer: Medicare Other | Attending: Emergency Medicine | Admitting: Emergency Medicine

## 2021-01-29 DIAGNOSIS — K9423 Gastrostomy malfunction: Secondary | ICD-10-CM

## 2021-01-29 DIAGNOSIS — Z79899 Other long term (current) drug therapy: Secondary | ICD-10-CM | POA: Insufficient documentation

## 2021-01-29 DIAGNOSIS — E039 Hypothyroidism, unspecified: Secondary | ICD-10-CM | POA: Insufficient documentation

## 2021-01-29 DIAGNOSIS — Z87891 Personal history of nicotine dependence: Secondary | ICD-10-CM | POA: Insufficient documentation

## 2021-01-29 DIAGNOSIS — Z931 Gastrostomy status: Secondary | ICD-10-CM

## 2021-01-29 DIAGNOSIS — I10 Essential (primary) hypertension: Secondary | ICD-10-CM | POA: Diagnosis not present

## 2021-01-29 NOTE — ED Triage Notes (Signed)
Patient to ED via EMS from [facility] for complaints of needing G-Tube replace. Per staff patient pulled out tube, and they were unable to replace it d/t her "breaking the tube". Patient alert to self, has no complaints at this time.

## 2021-01-29 NOTE — ED Notes (Signed)
Transport at bedside at this time.

## 2021-01-29 NOTE — Discharge Instructions (Signed)
-   We are unable to fix your gastrostomy tube tonight. It will need to be replaced by the interventional radiology team tomorrow. Please call the number above in the morning to schedule an appointment to find a time to replace the tube.

## 2021-01-29 NOTE — ED Provider Notes (Signed)
[facility] EMERGENCY DEPARTMENT Provider Note   CSN: [id number] Arrival date & time: 01/29/21  1935     History No chief complaint on file.   Kaylee King is a 80 y.o. female.  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  Patient arrives via EMS who states that patient pulled out her tube earlier today.  They report that her facility was unable to replace it because the tip of it was broken.  She presents alert and awake.  Patient is oriented to herself.  This appears to be her baseline.  She has no complaints.  She denies any abdominal pain, chest pain and shortness of breath.  Patient states that she did not break her tube.        Past Medical History:  Diagnosis Date  . Cirrhosis (HCC)   . Polymicrobial bacterial infection 01/08/2021  . Thyroid disease    hypothyroid     Patient Active Problem List   Diagnosis Date Noted  . DNR (do not resuscitate) 01/12/2021  . Altered mental status   . Cirrhosis (HCC)   . Polymicrobial bacterial infection 01/08/2021  . Sepsis (HCC) 01/06/2021  . Acute lower UTI 01/06/2021  . Normocytic anemia 01/06/2021  . Dysphagia 01/06/2021  . PEG (percutaneous endoscopic gastrostomy) status (HCC) 01/06/2021  . Hypotension 01/06/2021  . Acute respiratory failure with hypoxia and hypercapnia (HCC)   . Respiratory failure (HCC)   . Pressure injury of skin 11/27/2020  . Acute metabolic encephalopathy 11/23/2020  . GI bleed   . 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 Portal hypertension (HCC)   . Acute gastric ulcer without hemorrhage or perforation   . Hypothyroid 01/01/2012  . HTN (hypertension) 01/01/2012    Past Surgical History:  Procedure Laterality Date  . ESOPHAGEAL BANDING  11/23/2020   Procedure: ESOPHAGEAL BANDING;  Surgeon: Beverley Fiedler,  MD;  Location: [facility] ENDOSCOPY;  Service: Gastroenterology;;  . ESOPHAGEAL BANDING  01/10/2021   Procedure: ESOPHAGEAL BANDING;  Surgeon: Rachael Fee, MD;  Location: [facility] ENDOSCOPY;  Service: Endoscopy;;  . ESOPHAGOGASTRODUODENOSCOPY N/A 11/23/2020   Procedure: ESOPHAGOGASTRODUODENOSCOPY (EGD);  Surgeon: Beverley Fiedler, MD;  Location[facility] ENDOSCOPY;  Service: Gastroenterology;  Laterality: N/A;  . ESOPHAGOGASTRODUODENOSCOPY (EGD) WITH PROPOFOL N/A 01/10/2021   Procedure: ESOPHAGOGASTRODUODENOSCOPY (EGD) WITH PROPOFOL;  Surgeon: Rachael Fee, MD;  Location: [facility] ENDOSCOPY;  Service: Endoscopy;  Laterality: N/A;  . IR GASTROSTOMY TUBE MOD SED  12/18/2020  . RADIOLOGY WITH ANESTHESIA N/A 12/12/2020   Procedure: MRI WITH ANESTHESIA;  Surgeon: Radiologist, Medication, MD;  Location: [facility] OR;  Service: Radiology;  Laterality: N/A;     OB History   No obstetric history on file.     History reviewed. No pertinent family history.  Social History   Tobacco Use  . Smoking status: Former Smoker    Quit date: 12/31/1981    Years since quitting: 39.1  . Smokeless tobacco: Never Used  Vaping Use  . Vaping Use: Never used  Substance Use Topics  . Alcohol use: Never  . Drug use: Never    Home Medications Prior to Admission medications   Medication Sig Start Date End Date Taking? Authorizing Provider  acetaminophen (TYLENOL) 325 MG tablet Place 2 tablets (650 mg total) into feeding tube every 6 (six) hours as needed for headache, fever or mild pain. 12/19/20  Danford, Earl Lites, MD  lactulose (CHRONULAC) 10 GM/15ML solution Place 30 mLs (20 g total) into feeding tube 2 (two) times daily. 12/19/20   Danford, Earl Lites, MD  levothyroxine (SYNTHROID) 150 MCG tablet Place 1 tablet (150 mcg total) into feeding tube daily before breakfast. 01/13/21 02/12/21  Arrien, York Ram, MD  morphine 20 MG/5ML solution Place 2.5 mLs (10 mg total) into feeding tube every 4 (four) hours as needed for pain. 01/12/21    Arrien, York Ram, MD  Multiple Vitamin (MULTIVITAMIN WITH MINERALS) TABS tablet Place 1 tablet into feeding tube daily. 01/13/21 02/12/21  Arrien, York Ram, MD  nystatin ointment (MYCOSTATIN) Apply 1 application topically in the morning and at bedtime. Apply to affected area(s) on groin    [provider]  pantoprazole (PROTONIX) 40 MG tablet Take 40 mg by mouth daily. Via tube    [provider]  rifaximin (XIFAXAN) 550 MG TABS tablet Place 1 tablet (550 mg total) into feeding tube 2 (two) times daily. 12/19/20   Danford, Earl Lites, MD  spironolactone (ALDACTONE) 25 MG tablet Place 1 tablet (25 mg total) into feeding tube daily. 12/20/20   Danford, Earl Lites, MD  thiamine 100 MG tablet Place 1 tablet (100 mg total) into feeding tube daily. 12/19/20   Danford, Earl Lites, MD  Water For Irrigation, Sterile (FREE WATER) SOLN Place 200 mLs into feeding tube every 6 (six) hours. 12/19/20   Danford, Earl Lites, MD    Allergies    Patient has no known allergies.  Review of Systems   Review of Systems  Constitutional: Negative for chills and fever.  HENT: Negative for ear pain and sore throat.   Eyes: Negative for pain and visual disturbance.  Respiratory: Negative for cough and shortness of breath.   Cardiovascular: Negative for chest pain and palpitations.  Gastrointestinal: Negative for abdominal pain and vomiting.  Genitourinary: Negative for dysuria and hematuria.  Musculoskeletal: Negative for arthralgias and back pain.  Skin: Negative for color change and rash.  Neurological: Negative for seizures and syncope.  All other systems reviewed and are negative.   Physical Exam Updated Vital Signs BP (!) 146/67   Pulse 75   Temp 98 F (36.7 C) (Oral)   Resp 19   Ht 5\' 7"  (1.702 m)   Wt 72.6 kg   SpO2 96%   BMI 25.06 kg/m   Physical Exam Vitals and nursing note reviewed.  Constitutional:      General: She is not in acute distress.     Appearance: She is well-developed.  HENT:     Head: Normocephalic and atraumatic.     Nose: Nose normal.  Eyes:     General:        Right eye: No discharge.        Left eye: No discharge.     Extraocular Movements: Extraocular movements intact.  Cardiovascular:     Rate and Rhythm: Normal rate and regular rhythm.  Pulmonary:     Effort: Pulmonary effort is normal. No respiratory distress.  Abdominal:     Palpations: Abdomen is soft.     Tenderness: There is no abdominal tenderness.     Comments: Gastrostomy tube in place.  G-tube site clean dry nonerythematous.  Tube does appear to be broken at the distal tip.  Musculoskeletal:     Cervical back: Neck supple.  Skin:    General: Skin is warm and dry.  Neurological:     General: No focal deficit present.  Mental Status: She is alert. Mental status is at baseline.     ED Results / Procedures / Treatments   Labs (all labs ordered are listed, but only abnormal results are displayed) Labs Reviewed - No data to display  EKG None  Radiology No results found.  Procedures Procedures   Medications Ordered in ED Medications - No data to display  ED Course  I have reviewed the triage vital signs and the nursing notes.  Pertinent labs & imaging results that were available during my care of the patient were reviewed by me and considered in my medical decision making (see chart for details).    MDM Rules/Calculators/A&P                          80 year old female who presents from her nursing home after reports that patient pulled out her G-tube and they were unable to replace it due to breaking the tube.  No concerns or complaints at this time.  Patient's G-tube site appears healthy.  Patient's tube appears to be placed with pull-through method.  There is no balloon to deflate and remove patient's tube.  Called IR attending, Dr. Archer Asa.  Discussed patient's case with him. If patient does not meet criteria for inpatient  admission, she is safe to discharge home with follow-up tomorrow. He states that patient will be able to call the IR office in the morning to schedule an appointment for tomorrow for replacement.   Based off my exam and talking with the patient, patient has no further need for emergency department work-up.  She is stable to return to her facility.  Facility will need to call IR in the morning for gastrostomy tube replacement.  Clinic number placed on AVS sheet.  Circled number for nursing home.  Stable for discharge home.  Final Clinical Impression(s) / ED Diagnoses Final diagnoses:  Gastrostomy tube dependent (HCC)  Gastrostomy tube dysfunction [facility])    Rx / DC Orders ED Discharge Orders    None       Lurie Mullane, [place], MD 01/29/21 2137    Blane Ohara, MD 01/29/21 2312

## 2021-01-30 ENCOUNTER — Emergency Department (HOSPITAL_COMMUNITY)
Admission: EM | Admit: 2021-01-30 | Discharge: 2021-01-31 | Disposition: A | Discharge status: Home / Self Care | Payer: Medicare Other | Attending: Emergency Medicine | Admitting: Emergency Medicine

## 2021-01-30 ENCOUNTER — Encounter (HOSPITAL_COMMUNITY): Payer: Self-pay | Admitting: *Deleted

## 2021-01-30 ENCOUNTER — Other Ambulatory Visit: Payer: Self-pay

## 2021-01-30 DIAGNOSIS — I10 Essential (primary) hypertension: Secondary | ICD-10-CM | POA: Insufficient documentation

## 2021-01-30 DIAGNOSIS — E039 Hypothyroidism, unspecified: Secondary | ICD-10-CM | POA: Insufficient documentation

## 2021-01-30 DIAGNOSIS — K9423 Gastrostomy malfunction: Secondary | ICD-10-CM | POA: Diagnosis not present

## 2021-01-30 DIAGNOSIS — Z87891 Personal history of nicotine dependence: Secondary | ICD-10-CM | POA: Insufficient documentation

## 2021-01-30 LAB — CBC WITH DIFFERENTIAL/PLATELET
Abs Immature Granulocytes: 0.03 10*3/uL (ref 0.00–0.07)
Basophils Absolute: 0 10*3/uL (ref 0.0–0.1)
Basophils Relative: 0 %
Eosinophils Absolute: 0.1 10*3/uL (ref 0.0–0.5)
Eosinophils Relative: 2 %
HCT: 28.8 % — ABNORMAL LOW (ref 36.0–46.0)
Hemoglobin: 8.4 g/dL — ABNORMAL LOW (ref 12.0–15.0)
Immature Granulocytes: 1 %
Lymphocytes Relative: 21 %
Lymphs Abs: 1 10*3/uL (ref 0.7–4.0)
MCH: 28.4 pg (ref 26.0–34.0)
MCHC: 29.2 g/dL — ABNORMAL LOW (ref 30.0–36.0)
MCV: 97.3 fL (ref 80.0–100.0)
Monocytes Absolute: 0.3 10*3/uL (ref 0.1–1.0)
Monocytes Relative: 7 %
Neutro Abs: 3.2 10*3/uL (ref 1.7–7.7)
Neutrophils Relative %: 69 %
Platelets: 372 10*3/uL (ref 150–400)
RBC: 2.96 MIL/uL — ABNORMAL LOW (ref 3.87–5.11)
RDW: 18.6 % — ABNORMAL HIGH (ref 11.5–15.5)
WBC: 4.7 10*3/uL (ref 4.0–10.5)
nRBC: 0 % (ref 0.0–0.2)

## 2021-01-30 LAB — COMPREHENSIVE METABOLIC PANEL
ALT: 22 U/L (ref 0–44)
AST: 34 U/L (ref 15–41)
Albumin: 3 g/dL — ABNORMAL LOW (ref 3.5–5.0)
Alkaline Phosphatase: 92 U/L (ref 38–126)
Anion gap: 6 (ref 5–15)
BUN: 24 mg/dL — ABNORMAL HIGH (ref 8–23)
CO2: 24 mmol/L (ref 22–32)
Calcium: 8.9 mg/dL (ref 8.9–10.3)
Chloride: 108 mmol/L (ref 98–111)
Creatinine, Ser: 1.01 mg/dL — ABNORMAL HIGH (ref 0.44–1.00)
GFR, Estimated: 56 mL/min — ABNORMAL LOW (ref >60)
Glucose, Bld: 82 mg/dL (ref 70–99)
Potassium: 4.2 mmol/L (ref 3.5–5.1)
Sodium: 138 mmol/L (ref 135–145)
Total Bilirubin: 1.3 mg/dL — ABNORMAL HIGH (ref 0.3–1.2)
Total Protein: 7.9 g/dL (ref 6.5–8.1)

## 2021-01-30 MED ORDER — RIFAXIMIN 550 MG PO TABS
550.0000 mg | ORAL_TABLET | Freq: Two times a day (BID) | ORAL | Status: DC
Start: 2021-01-31 — End: 2021-01-31
  Filled 2021-01-30: qty 1

## 2021-01-30 MED ORDER — SPIRONOLACTONE 25 MG PO TABS
25.0000 mg | ORAL_TABLET | Freq: Every day | ORAL | Status: DC
  Filled 2021-01-30: qty 1

## 2021-01-30 MED ORDER — FREE WATER
200.0000 mL | Freq: Four times a day (QID) | Status: DC
  Administered 2021-01-31 (×2): 200 mL

## 2021-01-30 MED ORDER — PANTOPRAZOLE SODIUM 40 MG PO TBEC: 40.0000 mg | DELAYED_RELEASE_TABLET | Freq: Every day | ORAL | Status: DC

## 2021-01-30 MED ORDER — LEVOTHYROXINE SODIUM 50 MCG PO TABS
150.0000 ug | ORAL_TABLET | Freq: Every day | ORAL | Status: DC
  Administered 2021-01-31: 150 ug
  Filled 2021-01-30: qty 1

## 2021-01-30 MED ORDER — THIAMINE HCL 100 MG PO TABS: 100.0000 mg | ORAL_TABLET | Freq: Every day | ORAL | Status: DC

## 2021-01-30 MED ORDER — LACTULOSE 10 GM/15ML PO SOLN: 20.0000 g | Freq: Two times a day (BID) | ORAL | Status: DC

## 2021-01-30 NOTE — ED Provider Notes (Signed)
[facility]-EMERGENCY DEPT Provider Note   CSN: [id number] Arrival date & time: 01/30/21  1758     History Chief Complaint  Patient presents with  . G Tube replacement    Kaylee King is a 80 y.o. female.  The history is provided by the patient, the EMS personnel, the nursing home and medical records.   Kaylee King is a 80 y.o. female who presents to the Emergency Department complaining of g tube replacement. Level V caveat due to confusion.  She is G-tube dependent. She was referred to the emergency department from [facility] assisted living for feeding tube replacement. She was evaluated in the emergency department yesterday and discharged back to the facility with plan to have the gastrostomy tube replaced on an outpatient basis today. The facility return to the emergency department because she has not had anything by mouth for the last 24 hours. On reaching the facility is unclear if anybody attempted to arrange gastrostomy tube replacement for the patient today. Patient denies any complaints.  She has a 20 french pull through gastrostomy tube placed by IR in February 2022     Past Medical History:  Diagnosis Date  . Cirrhosis (HCC)   . Polymicrobial bacterial infection 01/08/2021  . Thyroid disease    hypothyroid     Patient Active Problem List   Diagnosis Date Noted  . DNR (do not resuscitate) 01/12/2021  . Altered mental status   . Cirrhosis (HCC)   . Polymicrobial bacterial infection 01/08/2021  . Sepsis (HCC) 01/06/2021  . Acute lower UTI 01/06/2021  . Normocytic anemia 01/06/2021  . Dysphagia 01/06/2021  . PEG (percutaneous endoscopic gastrostomy) status (HCC) 01/06/2021  . Hypotension 01/06/2021  . Acute respiratory failure with hypoxia and hypercapnia (HCC)   . Respiratory failure (HCC)   . Pressure injury of skin 11/27/2020  . Acute metabolic encephalopathy 11/23/2020  . GI bleed   . Varices of esophagus determined by endoscopy (HCC)    . Portal hypertension (HCC)   . Acute gastric ulcer without hemorrhage or perforation   . Hypothyroid 01/01/2012  . HTN (hypertension) 01/01/2012    Past Surgical History:  Procedure Laterality Date  . ESOPHAGEAL BANDING  11/23/2020   Procedure: ESOPHAGEAL BANDING;  Surgeon: Beverley Fiedler, MD;  Location: [facility] ENDOSCOPY;  Service: Gastroenterology;;  . ESOPHAGEAL BANDING  01/10/2021   Procedure: ESOPHAGEAL BANDING;  Surgeon: Rachael Fee, MD;  Location: [facility] ENDOSCOPY;  Service: Endoscopy;;  . ESOPHAGOGASTRODUODENOSCOPY N/A 11/23/2020   Procedure: ESOPHAGOGASTRODUODENOSCOPY (EGD);  Surgeon: Beverley Fiedler, MD;  Location: [facility] ENDOSCOPY;  Service: Gastroenterology;  Laterality: N/A;  . ESOPHAGOGASTRODUODENOSCOPY (EGD) WITH PROPOFOL N/A 01/10/2021   Procedure: ESOPHAGOGASTRODUODENOSCOPY (EGD) WITH PROPOFOL;  Surgeon: Rachael Fee, MD;  Location: [facility] ENDOSCOPY;  Service: Endoscopy;  Laterality: N/A;  . IR GASTROSTOMY TUBE MOD SED  12/18/2020  . RADIOLOGY WITH ANESTHESIA N/A 12/12/2020   Procedure: MRI WITH ANESTHESIA;  Surgeon: Radiologist, Medication, MD;  Location: [facility] OR;  Service: Radiology;  Laterality: N/A;     OB History   No obstetric history on file.     No family history on file.  Social History   Tobacco Use  . Smoking status: Former Smoker    Quit date: 12/31/1981    Years since quitting: 39.1  . Smokeless tobacco: Never Used  Vaping Use  . Vaping Use: Never used  Substance Use Topics  . Alcohol use: Never  . Drug use: Never    Home Medications Prior to Admission  medications   Medication Sig Start Date End Date Taking? Authorizing Provider  acetaminophen (TYLENOL) 325 MG tablet Place 2 tablets (650 mg total) into feeding tube every 6 (six) hours as needed for headache, fever or mild pain. 12/19/20   Danford, Earl Lites, MD  lactulose (CHRONULAC) 10 GM/15ML solution Place 30 mLs (20 g total) into feeding tube 2 (two) times daily. 12/19/20   Danford, Earl Lites, MD   levothyroxine (SYNTHROID) 150 MCG tablet Place 1 tablet (150 mcg total) into feeding tube daily before breakfast. 01/13/21 02/12/21  Arrien, York Ram, MD  morphine 20 MG/5ML solution Place 2.5 mLs (10 mg total) into feeding tube every 4 (four) hours as needed for pain. 01/12/21   Arrien, York Ram, MD  Multiple Vitamin (MULTIVITAMIN WITH MINERALS) TABS tablet Place 1 tablet into feeding tube daily. 01/13/21 02/12/21  Arrien, York Ram, MD  nystatin ointment (MYCOSTATIN) Apply 1 application topically in the morning and at bedtime. Apply to affected area(s) on groin    [provider]  pantoprazole (PROTONIX) 40 MG tablet Take 40 mg by mouth daily. Via tube    [provider]  rifaximin (XIFAXAN) 550 MG TABS tablet Place 1 tablet (550 mg total) into feeding tube 2 (two) times daily. 12/19/20   Danford, Earl Lites, MD  spironolactone (ALDACTONE) 25 MG tablet Place 1 tablet (25 mg total) into feeding tube daily. 12/20/20   Danford, Earl Lites, MD  thiamine 100 MG tablet Place 1 tablet (100 mg total) into feeding tube daily. 12/19/20   Danford, Earl Lites, MD  Water For Irrigation, Sterile (FREE WATER) SOLN Place 200 mLs into feeding tube every 6 (six) hours. 12/19/20   Danford, Earl Lites, MD    Allergies    Patient has no known allergies.  Review of Systems   Review of Systems  All other systems reviewed and are negative.   Physical Exam Updated Vital Signs BP 121/71   Pulse 71   Temp 98.1 F (36.7 C) (Oral)   Resp 16   Wt 68.4 kg   SpO2 100%   BMI 23.62 kg/m   Physical Exam Vitals and nursing note reviewed.  Constitutional:      Appearance: She is well-developed.  HENT:     Head: Normocephalic and atraumatic.  Cardiovascular:     Rate and Rhythm: Normal rate and regular rhythm.     Heart sounds: No murmur heard.   Pulmonary:     Effort: Pulmonary effort is normal. No respiratory distress.     Breath sounds: Normal breath sounds.   Abdominal:     Palpations: Abdomen is soft.     Tenderness: There is no abdominal tenderness. There is no guarding or rebound.     Comments: There is a gastrostomy tube in place in the abdomen with no surrounding edema or erythema. No abdominal wall tenderness. The gastrostomy tube is clamped off at the distal portion. There tube is broken distally.  Musculoskeletal:        General: No tenderness.  Skin:    General: Skin is warm and dry.  Neurological:     Mental Status: She is alert.     Comments: Oriented to person. Disoriented to time in recent events.  Psychiatric:        Behavior: Behavior normal.     ED Results / Procedures / Treatments   Labs (all labs ordered are listed, but only abnormal results are displayed) Labs Reviewed  COMPREHENSIVE METABOLIC PANEL - Abnormal; Notable for the following  components:      Result Value   BUN 24 (*)    Creatinine, Ser 1.01 (*)    Albumin 3.0 (*)    Total Bilirubin 1.3 (*)    GFR, Estimated 56 (*)    All other components within normal limits  CBC WITH DIFFERENTIAL/PLATELET - Abnormal; Notable for the following components:   RBC 2.96 (*)    Hemoglobin 8.4 (*)    HCT 28.8 (*)    MCHC 29.2 (*)    RDW 18.6 (*)    All other components within normal limits    EKG None  Radiology No results found.  Procedures Procedures   Medications Ordered in ED Medications  lactulose (CHRONULAC) 10 GM/15ML solution 20 g (has no administration in time range)  levothyroxine (SYNTHROID) tablet 150 mcg (has no administration in time range)  pantoprazole (PROTONIX) EC tablet 40 mg (has no administration in time range)  rifaximin (XIFAXAN) tablet 550 mg (has no administration in time range)  spironolactone (ALDACTONE) tablet 25 mg (has no administration in time range)  thiamine tablet 100 mg (has no administration in time range)  free water 200 mL (has no administration in time range)    ED Course  I have reviewed the triage vital signs and  the nursing notes.  Pertinent labs & imaging results that were available during my care of the patient were reviewed by me and considered in my medical decision making (see chart for details).    MDM Rules/Calculators/A&P                         patient is gastrostomy tube dependent it was referred to the emergency department due to malfunction of the gastrostomy tube and being NPO for 24 hours. She is without acute complaints on ED assessment. Discussed with Dr. Grace Isaac with IR, patient will be on the schedule for gastrostomy tube replacement in the morning. Discussed with patient's daughter treatment plan. Patient care transferred pending gastrostomy tube replacement.  Final Clinical Impression(s) / ED Diagnoses Final diagnoses:  Malfunction of gastrostomy tube [facility])    Rx / DC Orders ED Discharge Orders    None       Tilden Fossa, MD 01/30/21 2300

## 2021-01-30 NOTE — ED Provider Notes (Signed)
  Provider Note MRN:  [id number]  Arrival date & time: 01/30/21    ED Course and Medical Decision Making  Assumed care from Dr. Madilyn Hook at shift change.  Plan is for IR to replace leaking gastrostomy tube tomorrow morning.  6 AM update: No acute events overnight, signed out to oncoming provider at shift change. Procedures  Final Clinical Impressions(s) / ED Diagnoses     ICD-10-CM   1. Malfunction of gastrostomy tube [facility])  V61.53     ED Discharge Orders    None      Discharge Instructions   None     Elmer Sow. Pilar Plate, MD [facility] Emergency Medicine [facility] [email or web address]    Sabas Sous, MD 01/31/21 [phone]

## 2021-01-31 NOTE — ED Provider Notes (Signed)
Assumed patient care at start of my shift.  Please refer to previous notes for full description.  Briefly this is an 80 year old female with a chronic indwelling G-tube who is planned for IR replacement this morning.   Physical Exam  BP (!) 99/47   Pulse 70   Temp 98.1 F (36.7 C) (Oral)   Resp 17   Wt 68.4 kg   SpO2 96%   BMI 23.62 kg/m   Physical Exam Vitals and nursing note reviewed.  Constitutional:      Appearance: Normal appearance.  HENT:     Head: Normocephalic.     Mouth/Throat:     Mouth: Mucous membranes are moist.  Cardiovascular:     Rate and Rhythm: Normal rate.  Pulmonary:     Effort: Pulmonary effort is normal. No respiratory distress.  Abdominal:     Comments: G-tube in place  Skin:    General: Skin is warm.  Neurological:     Mental Status: She is alert. Mental status is at baseline.     ED Course/Procedures     Procedures  MDM  Taken to IR suite and the G-tube was successfully placed.  On reevaluation vitals are stable, abdomen is soft, she offers no complaints.  Patient will be discharged and treated as an outpatient.  Discharge plan and strict return to ED precautions discussed, patient verbalizes understanding and agreement.       Rozelle Logan, DO 01/31/21 1056

## 2021-01-31 NOTE — Progress Notes (Signed)
  Kaylee King is an 80 yo female who was brought to the ED from her facility due to her feeding  Tube being broken at the tip.  The gastrostomy tube was placed by Dr. Loreta Ave on 12/18/2020. It is a pull through tube.  On exam, it appears the tip was cut with a pair of scissors.  I simply placed a new 3 way tip on the gastrostomy tube. I flushed it to make sure it worked and did not leak.   She is ready for discharge back to her facility.  The tube is ok for use.  [name] S Charlee Whitebread PA-C 01/31/2021 9:25 AM

## 2021-01-31 NOTE — Discharge Instructions (Addendum)
You have been seen and discharged from the emergency department.  Your G-tube was placed by interventional radiology.  Follow-up with your primary provider for reevaluation and further care. Take home medications as prescribed.

## 2021-01-31 NOTE — ED Notes (Signed)
Used a "christmas tree" inserted into end of peg tube and inserted 200 ml of water

## 2021-01-31 NOTE — ED Notes (Signed)
sandwich and juice provided.

## 2021-02-08 ENCOUNTER — Other Ambulatory Visit: Payer: Self-pay

## 2021-02-08 ENCOUNTER — Emergency Department (HOSPITAL_COMMUNITY): Payer: Medicare Other

## 2021-02-08 ENCOUNTER — Emergency Department (HOSPITAL_COMMUNITY)
Admission: EM | Admit: 2021-02-08 | Discharge: 2021-02-08 | Disposition: A | Discharge status: Home / Self Care | Payer: Medicare Other | Attending: Emergency Medicine | Admitting: Emergency Medicine

## 2021-02-08 ENCOUNTER — Encounter (HOSPITAL_COMMUNITY): Payer: Self-pay

## 2021-02-08 DIAGNOSIS — E860 Dehydration: Secondary | ICD-10-CM | POA: Insufficient documentation

## 2021-02-08 DIAGNOSIS — R109 Unspecified abdominal pain: Secondary | ICD-10-CM | POA: Insufficient documentation

## 2021-02-08 DIAGNOSIS — N390 Urinary tract infection, site not specified: Secondary | ICD-10-CM | POA: Diagnosis not present

## 2021-02-08 DIAGNOSIS — E039 Hypothyroidism, unspecified: Secondary | ICD-10-CM | POA: Diagnosis not present

## 2021-02-08 DIAGNOSIS — Z87891 Personal history of nicotine dependence: Secondary | ICD-10-CM | POA: Insufficient documentation

## 2021-02-08 DIAGNOSIS — I10 Essential (primary) hypertension: Secondary | ICD-10-CM | POA: Insufficient documentation

## 2021-02-08 DIAGNOSIS — Z79899 Other long term (current) drug therapy: Secondary | ICD-10-CM | POA: Diagnosis not present

## 2021-02-08 DIAGNOSIS — R112 Nausea with vomiting, unspecified: Secondary | ICD-10-CM | POA: Insufficient documentation

## 2021-02-08 HISTORY — DX: Hypothyroidism, unspecified: E03.9

## 2021-02-08 HISTORY — DX: Essential (primary) hypertension: I10

## 2021-02-08 LAB — GASTROINTESTINAL PANEL BY PCR, STOOL (REPLACES STOOL CULTURE)

## 2021-02-08 LAB — COMPREHENSIVE METABOLIC PANEL
ALT: 20 U/L (ref 0–44)
AST: 34 U/L (ref 15–41)
Albumin: 3.3 g/dL — ABNORMAL LOW (ref 3.5–5.0)
Alkaline Phosphatase: 100 U/L (ref 38–126)
Anion gap: 12 (ref 5–15)
BUN: 42 mg/dL — ABNORMAL HIGH (ref 8–23)
CO2: 23 mmol/L (ref 22–32)
Calcium: 9.5 mg/dL (ref 8.9–10.3)
Chloride: 107 mmol/L (ref 98–111)
Creatinine, Ser: 1.24 mg/dL — ABNORMAL HIGH (ref 0.44–1.00)
GFR, Estimated: 43 mL/min — ABNORMAL LOW (ref >60)
Glucose, Bld: 123 mg/dL — ABNORMAL HIGH (ref 70–99)
Potassium: 5.1 mmol/L (ref 3.5–5.1)
Sodium: 142 mmol/L (ref 135–145)
Total Bilirubin: 1.7 mg/dL — ABNORMAL HIGH (ref 0.3–1.2)
Total Protein: 8.1 g/dL (ref 6.5–8.1)

## 2021-02-08 LAB — URINALYSIS, ROUTINE W REFLEX MICROSCOPIC
Bilirubin Urine: NEGATIVE
Glucose, UA: NEGATIVE mg/dL
Hgb urine dipstick: NEGATIVE
Ketones, ur: NEGATIVE mg/dL
Nitrite: POSITIVE — AB
Protein, ur: 100 mg/dL — AB
Specific Gravity, Urine: >1.046 — ABNORMAL HIGH (ref 1.005–1.030)
WBC, UA: >50 WBC/hpf — ABNORMAL HIGH (ref 0–5)
pH: 6 (ref 5.0–8.0)

## 2021-02-08 LAB — CBC WITH DIFFERENTIAL/PLATELET
Abs Immature Granulocytes: 0.11 10*3/uL — ABNORMAL HIGH (ref 0.00–0.07)
Basophils Absolute: 0 10*3/uL (ref 0.0–0.1)
Basophils Relative: 0 %
Eosinophils Absolute: 0 10*3/uL (ref 0.0–0.5)
Eosinophils Relative: 0 %
HCT: 28.6 % — ABNORMAL LOW (ref 36.0–46.0)
Hemoglobin: 8.8 g/dL — ABNORMAL LOW (ref 12.0–15.0)
Immature Granulocytes: 1 %
Lymphocytes Relative: 7 %
Lymphs Abs: 0.8 10*3/uL (ref 0.7–4.0)
MCH: 28.9 pg (ref 26.0–34.0)
MCHC: 30.8 g/dL (ref 30.0–36.0)
MCV: 94.1 fL (ref 80.0–100.0)
Monocytes Absolute: 0.4 10*3/uL (ref 0.1–1.0)
Monocytes Relative: 4 %
Neutro Abs: 10.5 10*3/uL — ABNORMAL HIGH (ref 1.7–7.7)
Neutrophils Relative %: 88 %
Platelets: 389 10*3/uL (ref 150–400)
RBC: 3.04 MIL/uL — ABNORMAL LOW (ref 3.87–5.11)
RDW: 18.6 % — ABNORMAL HIGH (ref 11.5–15.5)
WBC: 11.9 10*3/uL — ABNORMAL HIGH (ref 4.0–10.5)
nRBC: 0.2 % (ref 0.0–0.2)

## 2021-02-08 LAB — C DIFFICILE QUICK SCREEN W PCR REFLEX
C Diff antigen: NEGATIVE
C Diff interpretation: NOT DETECTED
C Diff toxin: NEGATIVE

## 2021-02-08 LAB — LIPASE, BLOOD: Lipase: 44 U/L (ref 11–51)

## 2021-02-08 MED ORDER — LACTATED RINGERS IV BOLUS
1000.0000 mL | Freq: Once | INTRAVENOUS | Status: AC
  Administered 2021-02-08: 1000 mL via INTRAVENOUS

## 2021-02-08 MED ORDER — IOHEXOL 300 MG/ML  SOLN
100.0000 mL | Freq: Once | INTRAMUSCULAR | Status: AC | PRN
  Administered 2021-02-08: 80 mL via INTRAVENOUS

## 2021-02-08 MED ORDER — ONDANSETRON HCL 4 MG/2ML IJ SOLN: 4.0000 mg | Freq: Once | INTRAMUSCULAR | Status: AC

## 2021-02-08 MED ORDER — ONDANSETRON HCL 4 MG/2ML IJ SOLN
INTRAMUSCULAR | Status: AC
  Administered 2021-02-08: 4 mg
  Filled 2021-02-08: qty 2

## 2021-02-08 MED ORDER — CEPHALEXIN 500 MG PO CAPS: 500.0000 mg | ORAL_CAPSULE | Freq: Four times a day (QID) | ORAL | 0 refills | Status: AC

## 2021-02-08 MED ORDER — SODIUM CHLORIDE 0.9 % IV SOLN
1.0000 g | Freq: Once | INTRAVENOUS | Status: AC
  Administered 2021-02-08: 1 g via INTRAVENOUS
  Filled 2021-02-08: qty 10

## 2021-02-08 NOTE — ED Provider Notes (Signed)
Signed out to d/c to home if/when UA resulted.   UA positive for uti, urine culture sent.   Rocephin iv.   No abd pain or nv. Vitals normal.  Pt appears stable for d/c.   Return precautions provided.      Cathren Laine, MD 02/08/21 1015

## 2021-02-08 NOTE — ED Triage Notes (Signed)
Pt arrives via EMS from [facility]. Staff reports pt had a total of 3 episodes of emesis. Pt typically receives tube feeds to supplement PO intake. Staff stopped her feed at 0500 02/07/21.  Last episode of emesis with night shift was reportedly yellow and frothy.  Pt reports she has not received meal trays during the day.

## 2021-02-08 NOTE — ED Notes (Signed)
[facility] called  

## 2021-02-08 NOTE — ED Provider Notes (Signed)
[facility]-EMERGENCY DEPT Provider Note   CSN: [id number] Arrival date & time: 02/08/21  0245     History Chief Complaint  Patient presents with  . Emesis    Kaylee King is a 80 y.o. female.  Presents to ER with concern for vomiting, abdominal pain.  Level 5 caveat limited due to patient's baseline confusion.  Patient able to provide some basic information.  She states that she is unsure why she was taken to the emergency room.  She does not have any abdominal pain nausea or vomiting.  Patient had 3 episodes of vomiting per report at her nursing facility.  Patient has a complex past medical history, G-tube dependent, resides in a nursing facility.  hypothyroidism, cirrhosis [facility], portal hypertension, esophageal and gastric varices.    HPI     Past Medical History:  Diagnosis Date  . Cirrhosis (HCC)   . Hypertension   . Hypothyroidism   . Polymicrobial bacterial infection 01/08/2021  . Thyroid disease    hypothyroid     Patient Active Problem List   Diagnosis Date Noted  . DNR (do not resuscitate) 01/12/2021  . Altered mental status   . Cirrhosis (HCC)   . Polymicrobial bacterial infection 01/08/2021  . Sepsis (HCC) 01/06/2021  . Acute lower UTI 01/06/2021  . Normocytic anemia 01/06/2021  . Dysphagia 01/06/2021  . PEG (percutaneous endoscopic gastrostomy) status (HCC) 01/06/2021  . Hypotension 01/06/2021  . Acute respiratory failure with hypoxia and hypercapnia (HCC)   . Respiratory failure (HCC)   . Pressure injury of skin 11/27/2020  . Acute metabolic encephalopathy 11/23/2020  . GI bleed   . 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 Portal hypertension (HCC)   . Acute gastric ulcer without hemorrhage or perforation   . Hypothyroid 01/01/2012  . HTN (hypertension) 01/01/2012    Past Surgical History:  Procedure Laterality Date  . ESOPHAGEAL BANDING  11/23/2020   Procedure: ESOPHAGEAL BANDING;  Surgeon: Beverley FiedlerPyrtle, Jay  M, MD;  [facility];  Service: Gastroenterology;;  . ESOPHAGEAL BANDING  01/10/2021   Procedure: ESOPHAGEAL BANDING;  Surgeon: Rachael FeeJacobs, Daniel P, MD;  [facility];  Service: Endoscopy;;  . ESOPHAGOGASTRODUODENOSCOPY N/A 11/23/2020   Procedure: ESOPHAGOGASTRODUODENOSCOPY (EGD);  Surgeon: Beverley FiedlerPyrtle, Jay M, MD;  [facility];  Service: Gastroenterology;  Laterality: N/A;  . ESOPHAGOGASTRODUODENOSCOPY (EGD) WITH PROPOFOL N/A 01/10/2021   Procedure: ESOPHAGOGASTRODUODENOSCOPY (EGD) WITH PROPOFOL;  Surgeon: Rachael FeeJacobs, Daniel P, MD;  [facility];  Service: Endoscopy;  Laterality: N/A;  . IR GASTROSTOMY TUBE MOD SED  12/18/2020  . RADIOLOGY WITH ANESTHESIA N/A 12/12/2020   Procedure: MRI WITH ANESTHESIA;  Surgeon: Radiologist, Medication, MD;  Location: [facility] OR;  Service: Radiology;  Laterality: N/A;     OB History   No obstetric history on file.     No family history on file.  Social History   Tobacco Use  . Smoking status: Former Smoker    Quit date: 12/31/1981    Years since quitting: 39.1  . Smokeless tobacco: Never Used  Vaping Use  . Vaping Use: Never used  Substance Use Topics  . Alcohol use: Never  . Drug use: Never    Home Medications Prior to Admission medications   Medication Sig Start Date End Date Taking? Authorizing Provider  acetaminophen (TYLENOL) 325 MG tablet Place 2 tablets (650 mg total) into feeding tube every 6 (six) hours as needed for headache, fever or mild pain. 12/19/20  Yes Danford, Earl Liteshristopher P,  MD  cephALEXin (KEFLEX) 500 MG capsule Take 1 capsule (500 mg total) by mouth 4 (four) times daily. 02/08/21  Yes Cathren Laine, MD  lactulose (CHRONULAC) 10 GM/15ML solution Take 45 g by mouth 2 (two) times daily.   Yes [provider]  levothyroxine (SYNTHROID) 150 MCG tablet Place 1 tablet (150 mcg total) into feeding tube daily before breakfast. 01/13/21 02/12/21 Yes Arrien, York Ram, MD  magic mouthwash SOLN Take 15 mLs by  mouth 4 (four) times daily.   Yes [provider]  morphine 20 MG/5ML solution Take 10 mg by mouth every 4 (four) hours as needed for pain.   Yes [provider]  Multiple Vitamin (MULTIVITAMIN WITH MINERALS) TABS tablet Place 1 tablet into feeding tube daily. 01/13/21 02/12/21 Yes Arrien, York Ram, MD  Nutritional Supplements (FEEDING SUPPLEMENT, JEVITY 1.2 CAL,) LIQD Place 1,000 mLs into feeding tube daily.   Yes [provider]  nystatin ointment (MYCOSTATIN) Apply 1 application topically in the morning and at bedtime. Apply to affected area(s) on groin   Yes [provider]  OXYGEN Inhale 2-3 L into the lungs as needed (shortness of breath whle lying down).   Yes [provider]  pantoprazole (PROTONIX) 40 MG tablet Take 40 mg by mouth daily. Via tube   Yes [provider]  rifaximin (XIFAXAN) 550 MG TABS tablet Take 550 mg by mouth 2 (two) times daily.   Yes [provider]  spironolactone (ALDACTONE) 25 MG tablet Take 25 mg by mouth daily.   Yes [provider]  thiamine (VITAMIN B-1) 100 MG tablet Take 100 mg by mouth daily.   Yes [provider]    Allergies    Patient has no known allergies.  Review of Systems   Review of Systems  Unable to perform ROS: Mental status change    Physical Exam Updated Vital Signs BP 113/76   Pulse 88   Temp 98.6 F (37 C) (Oral)   Resp 18   SpO2 95%   Physical Exam Vitals and nursing note reviewed.  Constitutional:      General: She is not in acute distress.    Appearance: She is well-developed.     Comments: Chronically ill-appearing but in no acute distress  HENT:     Head: Normocephalic and atraumatic.  Eyes:     Conjunctiva/sclera: Conjunctivae normal.  Cardiovascular:     Rate and Rhythm: Normal rate and regular rhythm.     Heart sounds: No murmur heard.   Pulmonary:     Effort: Pulmonary effort is normal. No respiratory distress.     Breath  sounds: Normal breath sounds.  Abdominal:     Palpations: Abdomen is soft.     Tenderness: There is no abdominal tenderness.  Musculoskeletal:     Cervical back: Neck supple.  Skin:    General: Skin is warm and dry.  Neurological:     Mental Status: She is alert.     Comments: Alert, answers basic questions appropriately and follows commands     ED Results / Procedures / Treatments   Labs (all labs ordered are listed, but only abnormal results are displayed) Labs Reviewed  CBC WITH DIFFERENTIAL/PLATELET - Abnormal; Notable for the following components:      Result Value   WBC 11.9 (*)    RBC 3.04 (*)    Hemoglobin 8.8 (*)    HCT 28.6 (*)    RDW 18.6 (*)    Neutro Abs 10.5 (*)  Abs Immature Granulocytes 0.11 (*)    All other components within normal limits  COMPREHENSIVE METABOLIC PANEL - Abnormal; Notable for the following components:   Glucose, Bld 123 (*)    BUN 42 (*)    Creatinine, Ser 1.24 (*)    Albumin 3.3 (*)    Total Bilirubin 1.7 (*)    GFR, Estimated 43 (*)    All other components within normal limits  URINALYSIS, ROUTINE W REFLEX MICROSCOPIC - Abnormal; Notable for the following components:   Color, Urine AMBER (*)    APPearance TURBID (*)    Specific Gravity, Urine >1.046 (*)    Protein, ur 100 (*)    Nitrite POSITIVE (*)    Leukocytes,Ua LARGE (*)    WBC, UA >50 (*)    Bacteria, UA MANY (*)    Non Squamous Epithelial 6-10 (*)    All other components within normal limits  GASTROINTESTINAL PANEL BY PCR, STOOL (REPLACES STOOL CULTURE)  C DIFFICILE QUICK SCREEN W PCR REFLEX  URINE CULTURE  LIPASE, BLOOD    EKG None  Radiology CT ABDOMEN PELVIS W CONTRAST  Result Date: 02/08/2021 CLINICAL DATA:  Nausea and vomiting EXAM: CT ABDOMEN AND PELVIS WITH CONTRAST TECHNIQUE: Multidetector CT imaging of the abdomen and pelvis was performed using the standard protocol following bolus administration of intravenous contrast. CONTRAST:  37mL OMNIPAQUE IOHEXOL  300 MG/ML  SOLN COMPARISON:  Recent CT abdomen/pelvis 01/07/2021 FINDINGS: Lower chest: Visualized cardiac structures are normal in size. No pericardial effusion. Varices noted along the course of the distal esophagus. Respiratory motion artifact in the lower lungs. Focal chronic bronchiectasis, chronic bronchitic changes and dependent atelectasis. Hepatobiliary: The liver does not appear cirrhotic in morphology. 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 The gallbladder is contracted. No intra or extrahepatic biliary ductal dilatation. Pancreas: Unremarkable. No pancreatic ductal dilatation or surrounding inflammatory changes. Spleen: Normal in size without focal abnormality. Adrenals/Urinary Tract: Unremarkable adrenal glands. No hydronephrosis or nephrolithiasis. Ureters and bladder are within normal limits. Stomach/Bowel: Well-positioned percutaneous gastrostomy tube. No evidence of focal bowel wall thickening or bowel obstruction. Colonic diverticular disease without CT evidence of active inflammation. Normal appendix. 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 Esophageal varices are present. Additionally, there is a gastro renal shunt resulting in large gastric varices. Hypertrophy of the left gonadal vein is also present. Aneurysmal dilation of the proximal infrarenal abdominal aorta with a maximal diameter of 3.2 cm. Extensive atherosclerotic calcifications along the aorta and branch vessels. Reproductive: Uterus and bilateral adnexa are unremarkable. Other: No abdominal wall hernia or abnormality. No abdominopelvic ascites. Musculoskeletal: No acute fracture or aggressive appearing lytic or blastic osseous lesion. Chronic appearing compression fractures at T12 and T10. No interval change compared to recent prior imaging. IMPRESSION: 1. No evidence of bowel obstruction, inflammatory process or other  acute abnormality within the abdomen or pelvis. 2. 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 3. Well-positioned percutaneous gastrostomy tube without evidence of complication. 4. 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 Recommend follow-up every 3 years. This recommendation follows ACR consensus guidelines: White Paper of the ACR Incidental Findings Committee II on Vascular Findings. J Am Coll Radiol 2013; 10:789-794. 5. Chronic T10 and T12 compression fractures without interval change. Aortic Atherosclerosis (ICD10-I70.0); Aortic aneurysm NOS (ICD10-I71.9). Electronically Signed   By: Isac Caddy.D.  On: 02/08/2021 05:46    Procedures Procedures   Medications Ordered in ED Medications  iohexol (OMNIPAQUE) 300 MG/ML solution 100 mL (80 mLs Intravenous Contrast Given 02/08/21 0534)  lactated ringers bolus 1,000 mL (0 mLs Intravenous Stopped 02/08/21 0920)  ondansetron (ZOFRAN) injection 4 mg (4 mg Intravenous Given 02/08/21 0351)  cefTRIAXone (ROCEPHIN) 1 g in sodium chloride 0.9 % 100 mL IVPB (0 g Intravenous Stopped 02/08/21 1240)    ED Course  I have reviewed the triage vital signs and the nursing notes.  Pertinent labs & imaging results that were available during my care of the patient were reviewed by me and considered in my medical decision making (see chart for details).    MDM Rules/Calculators/A&P                         80 year old lady G-tube dependent from nursing facility presents to ER with concern for abdominal pain nausea and vomiting.  Basic labs noted for slight elevation in creatinine, suspect degree of dehydration.  She had one episode of emesis and loose stool while in department.  She was provided antiemetics and fluids.  Negative for any acute pathology.  While awaiting UA and reassessment,  signed out to Dr. Denton Lank.  Anticipate likely stable for dc back to facility.  Final Clinical Impression(s) / ED Diagnoses Final diagnoses:  Acute UTI  Nausea and vomiting in adult    Rx / DC Orders ED Discharge Orders         Ordered    cephALEXin (KEFLEX) 500 MG capsule  4 times daily        02/08/21 1125           Milagros Loll, MD 02/08/21 2357

## 2021-02-08 NOTE — ED Notes (Addendum)
Insufficient volume obtained from in and out cath. Purewick in place

## 2021-02-08 NOTE — Discharge Instructions (Addendum)
It was our pleasure to provide your ER care today - we hope that you feel better.  The lab tests show a probable urine infection - take antibiotic as prescribed.   Drink adequate fluids/make sure to stay well hydrated.   Follow up with primary care doctor in 1 week.  Return to ER if worse, new symptoms, fevers, new, worsening or severe pain, persistent vomiting, trouble breathing, or other concern.

## 2021-02-10 LAB — URINE CULTURE: Culture: >=100000 — AB

## 2021-02-11 ENCOUNTER — Telehealth: Payer: Self-pay | Admitting: Emergency Medicine

## 2021-02-11 NOTE — Progress Notes (Signed)
ED Antimicrobial Stewardship Positive Culture Follow Up   Kaylee King is an 80 y.o. female who presented to [facility] on 02/08/2021 with a chief complaint of  Chief Complaint  Patient presents with  . Emesis    Recent Results (from the past 720 hour(s))  Gastrointestinal Panel by PCR , Stool     Status: None   Collection Time: 02/08/21  4:01 AM   Specimen: STOOL  Result Value Ref Range Status   Campylobacter species NOT DETECTED NOT DETECTED Final   Plesimonas shigelloides NOT DETECTED NOT DETECTED Final   Salmonella species NOT DETECTED NOT DETECTED Final   Yersinia enterocolitica NOT DETECTED NOT DETECTED Final   Vibrio species NOT DETECTED NOT DETECTED Final   Vibrio cholerae NOT DETECTED NOT DETECTED Final   Enteroaggregative E coli (EAEC) NOT DETECTED NOT DETECTED Final   Enteropathogenic E coli (EPEC) NOT DETECTED NOT DETECTED Final   Enterotoxigenic E coli (ETEC) NOT DETECTED NOT DETECTED Final   Shiga like toxin producing E coli (STEC) NOT DETECTED NOT DETECTED Final   Shigella/Enteroinvasive E coli (EIEC) NOT DETECTED NOT DETECTED Final   Cryptosporidium NOT DETECTED NOT DETECTED Final   Cyclospora cayetanensis NOT DETECTED NOT DETECTED Final   Entamoeba histolytica NOT DETECTED NOT DETECTED Final   Giardia lamblia NOT DETECTED NOT DETECTED Final   Adenovirus F40/41 NOT DETECTED NOT DETECTED Final   Astrovirus NOT DETECTED NOT DETECTED Final   Norovirus GI/GII NOT DETECTED NOT DETECTED Final   Rotavirus A NOT DETECTED NOT DETECTED Final   Sapovirus (I, II, IV, and V) NOT DETECTED NOT DETECTED Final    Comment: Performed at [facility], [address] [place], [place] [address]  C Difficile Quick Screen w PCR reflex     Status: None   Collection Time: 02/08/21  4:01 AM   Specimen: STOOL  Result Value Ref Range Status   C Diff antigen NEGATIVE NEGATIVE Final   C Diff toxin NEGATIVE NEGATIVE Final   C Diff interpretation No C. difficile detected.  Final     Comment: Performed at [facility], [address], [place], [place] [address]  Urine Culture     Status: Abnormal   Collection Time: 02/08/21  9:09 AM   Specimen: Urine, Clean Catch  Result Value Ref Range Status   Specimen Description   Final    URINE, CLEAN CATCH Performed at [facility], [address], [place], [place] [address]    Special Requests   Final    NONE Performed at [facility], [address], [place], [place] [address]    Culture (A)  Final    >=100,000 COLONIES/mL KLEBSIELLA PNEUMONIAE Confirmed Extended Spectrum Beta-Lactamase Producer (ESBL).  In bloodstream infections from ESBL organisms, carbapenems are preferred over piperacillin/tazobactam. They are shown to have a lower risk of mortality.    Report Status 02/10/2021 FINAL  Final   Organism ID, Bacteria KLEBSIELLA PNEUMONIAE (A)  Final      Susceptibility   Klebsiella pneumoniae - MIC*    AMPICILLIN >=32 RESISTANT Resistant     CEFAZOLIN >=64 RESISTANT Resistant     CEFEPIME 2 SENSITIVE Sensitive     CEFTRIAXONE >=64 RESISTANT Resistant     CIPROFLOXACIN 2 INTERMEDIATE Intermediate     GENTAMICIN >=16 RESISTANT Resistant     IMIPENEM <=0.25 SENSITIVE Sensitive     NITROFURANTOIN 64 INTERMEDIATE Intermediate     TRIMETH/SULFA >=320 RESISTANT Resistant     AMPICILLIN/SULBACTAM >=32 RESISTANT Resistant  PIP/TAZO 16 SENSITIVE Sensitive     * >=100,000 COLONIES/mL KLEBSIELLA PNEUMONIAE    [x]  Treated with cephalexin, organism resistant to prescribed antimicrobial  Plan: Stop taking cephalexin, take fosfomycin.  New antibiotic prescription: Fosfomycin 3 g PO x1 dose. No refills.   ED Provider: , PA-C   Namon Cirri, PharmD 02/11/2021, 1:11 PM Clinical Pharmacist [phone]

## 2021-02-11 NOTE — Telephone Encounter (Signed)
Post ED Visit - Positive Culture Follow-up: Successful Patient Follow-Up  Culture assessed and recommendations reviewed by:  []  , Pharm.D. []  Enzo Bi, Pharm.D., BCPS AQ-ID []  , Pharm.D., BCPS []  Celedonio Miyamoto, Pharm.D., BCPS []  [place], Garvin Fila.D., BCPS, AAHIVP []  , Pharm.D., BCPS, AAHIVP []  Georgina Pillion, PharmD, BCPS []  , PharmD, BCPS []  [place], PharmD, BCPS [x]  [address], PharmD  Positive urine culture  []  Patient discharged without antimicrobial prescription and treatment is now indicated [x]  Organism is resistant to prescribed ED discharge antimicrobial []  Patient with positive blood cultures  Changes discussed with ED provider:Kaitlyn [facility] New antibiotic prescription:  Fosfomycin 3 gram PO x one dose Called/faxed to [facility] Phone: [phone] Fax: [phone]   02/11/2021, 4:05 PM

## 2021-02-20 ENCOUNTER — Other Ambulatory Visit: Payer: Self-pay

## 2021-02-20 ENCOUNTER — Non-Acute Institutional Stay: Payer: Self-pay | Admitting: Student

## 2021-02-20 DIAGNOSIS — R0600 Dyspnea, unspecified: Secondary | ICD-10-CM

## 2021-02-20 DIAGNOSIS — R1310 Dysphagia, unspecified: Secondary | ICD-10-CM

## 2021-02-20 DIAGNOSIS — R1084 Generalized abdominal pain: Secondary | ICD-10-CM

## 2021-02-20 DIAGNOSIS — Z515 Encounter for palliative care: Secondary | ICD-10-CM

## 2021-02-20 NOTE — Progress Notes (Signed)
[facility] Consult Note Telephone: [phone]  Fax: [phone]    Date of encounter: 02/20/21 PATIENT NAME: Kaylee King [id number]   [phone] (home) [phone] (work) DOB: 1941/09/21 MRN: [id number] PRIMARY CARE PROVIDER:    Dr. Brayton El, NP  REFERRING PROVIDER:   Dr. Westley Gambles  RESPONSIBLE PARTY:    Contact Information    Name Relation Home Work Mobile   Kaylee King,Kaylee King Daughter [phone]         I met face to face with patient in facility. Daughter Pam via phone. Palliative Care was asked to follow this patient by consultation request of  Dr. Westley Gambles to address advance care planning and complex medical decision making. This is a follow up visit.                                   ASSESSMENT AND PLAN / RECOMMENDATIONS:   Advance Care Planning/Goals of Care: Goals include to maximize quality of life and symptom management. Daughter wishes for patient to return home. Our advance care planning conversation included a discussion about:     The value and importance of advance care planning   Experiences with loved ones who have been seriously ill or have died   Exploration of personal, cultural or spiritual beliefs that might influence medical decisions   Exploration of goals of care in the event of a sudden injury or illness   Identification and preparation of a healthcare agent   Review and updating or creation of an  advance directive document .  CODE STATUS: DNR  Symptom Management/Plan:  Pain-patient denies abdominal pain at present. C/o severe abdominal pain yesterday. X-ray results pending from yesterday. Recommend continuing acetaminophen 634m every 6 hours PRN mild pain, morphine 278m73ml 2.73m92mia PEG tube every 4 hours PRN severe pain.   Shortness of breath-patient with acute hypoxic respiratory failure. She requires oxygen PRN. Recommend continuing oxygen via  n/c at 2-3lpm PRN shortness of breath.   Dysphagia-patient receiving all nutrition via PEG tube. Continue Jevity 1.5 ar 37m49m, flushes and free water as directed. Aspiration precautions. Continue ST as directed. Recommend RD or nutritionist consult to review current feedings/water intake. Daughter expresses concern she is not receiving enough fluids/intake. Facility NP made aware and will order. May have pureed diet, thin liquids by mouth; aspirations precautions.    Follow up Palliative Care Visit: Palliative care will continue to follow for complex medical decision making, advance care planning, and clarification of goals. Return in 4 weeks or prn.  I spent 35 minutes providing this consultation. More than 50% of the time in this consultation was spent in counseling and care coordination.    PPS: 40%  HOSPICE ELIGIBILITY/DIAGNOSIS: TBD  Chief Complaint: Palliative Medicine follow up visit.   HISTORY OF PRESENT ILLNESS:  Kaylee King 80 y16. year old female  with hypertension, hypothyroidism, NASH w/cirrhosis, portal hypertension, esophageal and gastric varices, AAA, normocytic anemia.   [facility]. She is sitting up to w/c more frequently. She is receiving nutrition via PEG tube, able to have Pureed diet, thin liquids by mouth. Denies pain, abdominal pain at present; she did have severe abdominal pain yesterday. Feedings were placed on hold and received abd X-ray last night; results are pending. Feedings have resumed. Patient seen in ED on 3/28, 3/29 due gastrostomy tube  dysfunction, tube replaced per IR. ER visit on 4/7 d/t UTI; started on cephalexin.   History obtained from review of EMR, discussion with primary team, and interview with family, facility staff/caregiver and/or Ms. Coonrod.  I reviewed available labs, medications, imaging, studies and related documents from the EMR.  Records reviewed and summarized above.    ROS General: NAD EYES: denies vision changes Cardiovascular: denies chest pain, denies DOE Pulmonary: denies cough, denies increased SOB Abdomen: denies constipation GU: denies dysuria, urinary incontinence MSK: weakness Skin: denies rashes or wounds Neurological: denies pain, denies insomnia Psych: Endorses stable mood Heme/lymph/immuno: denies bruises, abnormal bleeding  Physical Exam:  Constitutional: NAD General: frail appearing, chronically ill appearing EYES: anicteric sclera, lids intact, no discharge  ENMT: intact hearing CV: S1S2, RRR, no LE edema Pulmonary: LCTA, no increased work of breathing, no cough, oxygen at 2lpm Abdomen: normo-active BS + 4 quadrants, soft and non tender, PEG LUQ GU: deferred MSK: sarcopenia, moves all extremities, non-ambulatory Skin: warm and dry, no rashes or wounds on visible skin Neuro: generalized weakness, Psych: non-anxious affect, A & O x 2 Hem/lymph/immuno: no widespread bruising   Thank you for the opportunity to participate in the care of Ms. Basquez.  The palliative care team will continue to follow. Please call our office at [phone] if we can be of additional assistance.   Ezekiel Slocumb, NP   COVID-19 PATIENT SCREENING TOOL Asked and negative response unless otherwise noted:   Have you had symptoms of covid, tested positive or been in contact with someone with symptoms/positive test in the past 5-10 days? No

## 2021-04-13 ENCOUNTER — Other Ambulatory Visit: Payer: Self-pay

## 2021-04-13 ENCOUNTER — Non-Acute Institutional Stay: Payer: Medicare Other | Admitting: Student

## 2021-04-13 DIAGNOSIS — R531 Weakness: Secondary | ICD-10-CM

## 2021-04-13 DIAGNOSIS — Z515 Encounter for palliative care: Secondary | ICD-10-CM

## 2021-04-13 DIAGNOSIS — R1310 Dysphagia, unspecified: Secondary | ICD-10-CM

## 2021-04-13 NOTE — Progress Notes (Signed)
[facility] Consult Note Telephone: [phone]  Fax: [phone]    Date of encounter: 04/13/21 PATIENT NAME: Kaylee King Kaylee King [id number]   [phone] (home) [phone] (work) DOB: [birth year] MRN: [id number] PRIMARY CARE PROVIDER:    Nolene Ebbs, MD,  [place] [address] [phone]  REFERRING PROVIDER:   Nolene Ebbs, MD [address] [place],  [place] [address] [phone]  RESPONSIBLE PARTY:    Contact Information     Name Relation Home Work Mobile   [place] Daughter [phone]          I met face to face with patient in the facility. Daughter Kaylee King via telephone. Palliative Care was asked to follow this patient by consultation request of  Dr. Westley Gambles to address advance care planning and complex medical decision making. This is a follow up visit.                                   ASSESSMENT AND PLAN / RECOMMENDATIONS:   Advance Care Planning/Goals of Care: Goals include to maximize quality of life and symptom management. Our advance care planning conversation included a discussion about:    The value and importance of advance care planning  Experiences with loved ones who have been seriously ill or have died  Exploration of personal, cultural or spiritual beliefs that might influence medical decisions  Exploration of goals of care in the event of a sudden injury or illness  Decision not to resuscitate or to de-escalate disease focused treatments due to poor prognosis. CODE STATUS: DNR  Daughter expressed several concerns. NP attempted to reach nurse/nursing supervisor; unable to speak with anyone.   Symptom Management/Plan:  Shortness of breath-patient with chronic hypoxic respiratory failure. She requires oxygen PRN. Recommend continuing oxygen via n/c at 2-3lpm PRN shortness of breath.    Dysphagia-patient receiving PO nutrition and nutrition via PEG  tube. Continue Jevity 1.2 at 35m/hr for 12 hours daily; flushes and free water as directed. Pureed diet; aspiration precautions. Continue ST as directed.   Generalized weakness secondary to chronic hypoxic respiratory failure. Continue PT/OT as directed. W/c for locomotion; walker for ambuation. Monitor for falls/safety.    Follow up Palliative Care Visit: Palliative care will continue to follow for complex medical decision making, advance care planning, and clarification of goals. Return in 8 weeks or prn.   This visit was coded based on medical decision making (MDM).  PPS: 40%  HOSPICE ELIGIBILITY/DIAGNOSIS: TBD  Chief Complaint: Palliative Medicine follow up visit.   HISTORY OF PRESENT ILLNESS:  Kaylee Trumbois a 80y.o. year old female  with generalized weakness, chronic hypoxic respiratory failure. Diagnoses also include hypertension, hypothyroidism, NASH w/cirrhosis, portal hypertension, esophageal and gastric varices, AAA, normocytic anemia.   [facility] She is receiving therapy. She is sitting up to w/c more frequently. Now ambulating with assistance. She reports eating by mouth. Receiving Pureed diet thin liquids. She is receiving nutrition via PEG tube Jevity 1.2 at 837mhr for 12 hours daily. Denies pain, chest pain, abdominal pain. No nausea, vomiting. Moving bowels regularly. She does have dyspnea with exertion. No recent ER visits.      History obtained from review of EMR, discussion with primary team, and interview with family, facility staff/caregiver and/or Ms. Hatcher.  I reviewed available labs, medications, imaging, studies and related documents from the EMR.  Records reviewed and summarized above.   ROS  General: NAD EYES: denies vision changes ENMT: dysphagia Cardiovascular: denies chest pain Pulmonary: denies cough, SOB with exertion Abdomen: feeding tube; denies constipation GU: denies dysuria MSK: weakness, no falls reported per  staff Skin: denies rashes or wounds Neurological: denies pain, denies insomnia Psych: Endorses positive mood Heme/lymph/immuno: denies bruises, abnormal bleeding  Physical Exam: Current and past weights: 142.7 pounds Pulse 92, resp 20, sats 99% on 3lpm Constitutional: NAD General: frail appearing EYES: anicteric sclera, lids intact, no discharge  ENMT: intact hearing, oral mucous membranes moist CV: S1S2, RRR, no LE edema Pulmonary: LCTA, dyspnea with exertion; no cough, O2 at 3lpm via n/c Abdomen: PEG tube LUQ, normo-active BS + 4 quadrants, soft and non tender GU: deferred MSK: moves all extremities, ambulatory with walker Skin: warm and dry, no rashes or wounds on visible skin Neuro:  no generalized weakness, A & O x 2 Psych: non-anxious affect, pleasant mood Hem/lymph/immuno: no widespread bruising   Thank you for the opportunity to participate in the care of Kaylee King.  The palliative care team will continue to follow. Please call our office at [phone] if we can be of additional assistance.   Ezekiel Slocumb, NP  COVID-19 PATIENT SCREENING TOOL Asked and negative response unless otherwise noted:   Have you had symptoms of covid, tested positive or been in contact with someone with symptoms/positive test in the past 5-10 days?  No

## 2021-07-12 ENCOUNTER — Emergency Department (HOSPITAL_COMMUNITY): Payer: Medicare Other

## 2021-07-12 ENCOUNTER — Encounter (HOSPITAL_COMMUNITY): Payer: Self-pay

## 2021-07-12 ENCOUNTER — Emergency Department (HOSPITAL_COMMUNITY)
Admission: EM | Admit: 2021-07-12 | Discharge: 2021-08-04 | Disposition: E | Payer: Medicare Other | Attending: Emergency Medicine | Admitting: Emergency Medicine

## 2021-07-12 ENCOUNTER — Other Ambulatory Visit: Payer: Self-pay

## 2021-07-12 DIAGNOSIS — I469 Cardiac arrest, cause unspecified: Secondary | ICD-10-CM

## 2021-07-12 DIAGNOSIS — E039 Hypothyroidism, unspecified: Secondary | ICD-10-CM | POA: Diagnosis not present

## 2021-07-12 DIAGNOSIS — K922 Gastrointestinal hemorrhage, unspecified: Secondary | ICD-10-CM | POA: Insufficient documentation

## 2021-07-12 DIAGNOSIS — R0689 Other abnormalities of breathing: Secondary | ICD-10-CM | POA: Diagnosis present

## 2021-07-12 DIAGNOSIS — Z79899 Other long term (current) drug therapy: Secondary | ICD-10-CM | POA: Insufficient documentation

## 2021-07-12 DIAGNOSIS — Z66 Do not resuscitate: Secondary | ICD-10-CM | POA: Insufficient documentation

## 2021-07-12 DIAGNOSIS — J9601 Acute respiratory failure with hypoxia: Secondary | ICD-10-CM

## 2021-07-12 DIAGNOSIS — I10 Essential (primary) hypertension: Secondary | ICD-10-CM | POA: Insufficient documentation

## 2021-07-12 DIAGNOSIS — R578 Other shock: Secondary | ICD-10-CM | POA: Insufficient documentation

## 2021-07-12 DIAGNOSIS — R570 Cardiogenic shock: Secondary | ICD-10-CM | POA: Diagnosis not present

## 2021-07-12 DIAGNOSIS — Z20822 Contact with and (suspected) exposure to covid-19: Secondary | ICD-10-CM | POA: Insufficient documentation

## 2021-07-12 DIAGNOSIS — Z87891 Personal history of nicotine dependence: Secondary | ICD-10-CM | POA: Diagnosis not present

## 2021-07-12 LAB — URINALYSIS, ROUTINE W REFLEX MICROSCOPIC
Bilirubin Urine: NEGATIVE
Glucose, UA: 100 mg/dL — AB
Ketones, ur: NEGATIVE mg/dL
Nitrite: POSITIVE — AB
Protein, ur: 100 mg/dL — AB
Specific Gravity, Urine: 1.02 (ref 1.005–1.030)
pH: 6 (ref 5.0–8.0)

## 2021-07-12 LAB — BRAIN NATRIURETIC PEPTIDE: B Natriuretic Peptide: 484.3 pg/mL — ABNORMAL HIGH (ref 0.0–100.0)

## 2021-07-12 LAB — COMPREHENSIVE METABOLIC PANEL
ALT: 49 U/L — ABNORMAL HIGH (ref 0–44)
AST: 100 U/L — ABNORMAL HIGH (ref 15–41)
Albumin: 2.6 g/dL — ABNORMAL LOW (ref 3.5–5.0)
Alkaline Phosphatase: 77 U/L (ref 38–126)
Anion gap: 18 — ABNORMAL HIGH (ref 5–15)
BUN: 35 mg/dL — ABNORMAL HIGH (ref 8–23)
CO2: 13 mmol/L — ABNORMAL LOW (ref 22–32)
Calcium: 8.6 mg/dL — ABNORMAL LOW (ref 8.9–10.3)
Chloride: 113 mmol/L — ABNORMAL HIGH (ref 98–111)
Creatinine, Ser: 1.68 mg/dL — ABNORMAL HIGH (ref 0.44–1.00)
GFR, Estimated: 30 mL/min — ABNORMAL LOW (ref >60)
Glucose, Bld: 287 mg/dL — ABNORMAL HIGH (ref 70–99)
Potassium: 4.4 mmol/L (ref 3.5–5.1)
Sodium: 144 mmol/L (ref 135–145)
Total Bilirubin: 0.8 mg/dL (ref 0.3–1.2)
Total Protein: 6.3 g/dL — ABNORMAL LOW (ref 6.5–8.1)

## 2021-07-12 LAB — CBC WITH DIFFERENTIAL/PLATELET
Abs Immature Granulocytes: 0.2 10*3/uL — ABNORMAL HIGH (ref 0.00–0.07)
Basophils Absolute: 0 10*3/uL (ref 0.0–0.1)
Basophils Relative: 0 %
Eosinophils Absolute: 0 10*3/uL (ref 0.0–0.5)
Eosinophils Relative: 0 %
HCT: 24.6 % — ABNORMAL LOW (ref 36.0–46.0)
Hemoglobin: 6.8 g/dL — CL (ref 12.0–15.0)
Lymphocytes Relative: 7 %
Lymphs Abs: 1.1 10*3/uL (ref 0.7–4.0)
MCH: 30.4 pg (ref 26.0–34.0)
MCHC: 27.6 g/dL — ABNORMAL LOW (ref 30.0–36.0)
MCV: 109.8 fL — ABNORMAL HIGH (ref 80.0–100.0)
Monocytes Absolute: 0 10*3/uL — ABNORMAL LOW (ref 0.1–1.0)
Monocytes Relative: 0 %
Myelocytes: 1 %
Neutro Abs: 15 10*3/uL — ABNORMAL HIGH (ref 1.7–7.7)
Neutrophils Relative %: 92 %
Platelets: 384 10*3/uL (ref 150–400)
RBC: 2.24 MIL/uL — ABNORMAL LOW (ref 3.87–5.11)
RDW: 21.4 % — ABNORMAL HIGH (ref 11.5–15.5)
WBC: 16.3 10*3/uL — ABNORMAL HIGH (ref 4.0–10.5)
nRBC: 1 /100 WBC — ABNORMAL HIGH
nRBC: 1.3 % — ABNORMAL HIGH (ref 0.0–0.2)

## 2021-07-12 LAB — I-STAT VENOUS BLOOD GAS, ED
Acid-base deficit: 18 mmol/L — ABNORMAL HIGH (ref 0.0–2.0)
Bicarbonate: 10.9 mmol/L — ABNORMAL LOW (ref 20.0–28.0)
Calcium, Ion: 1.12 mmol/L — ABNORMAL LOW (ref 1.15–1.40)
HCT: 23 % — ABNORMAL LOW (ref 36.0–46.0)
Hemoglobin: 7.8 g/dL — ABNORMAL LOW (ref 12.0–15.0)
O2 Saturation: 91 %
Potassium: 4.2 mmol/L (ref 3.5–5.1)
Sodium: 147 mmol/L — ABNORMAL HIGH (ref 135–145)
TCO2: 12 mmol/L — ABNORMAL LOW (ref 22–32)
pCO2, Ven: 37.9 mmHg — ABNORMAL LOW (ref 44.0–60.0)
pH, Ven: 7.067 — CL (ref 7.250–7.430)
pO2, Ven: 84 mmHg — ABNORMAL HIGH (ref 32.0–45.0)

## 2021-07-12 LAB — SARS CORONAVIRUS 2 (TAT 6-24 HRS): SARS Coronavirus 2: NEGATIVE

## 2021-07-12 LAB — URINALYSIS, MICROSCOPIC (REFLEX): RBC / HPF: >50 RBC/hpf (ref 0–5)

## 2021-07-12 LAB — PREPARE RBC (CROSSMATCH)

## 2021-07-12 LAB — TROPONIN I (HIGH SENSITIVITY): Troponin I (High Sensitivity): 31 ng/L — ABNORMAL HIGH (ref <18)

## 2021-07-12 LAB — AMMONIA: Ammonia: 163 umol/L — ABNORMAL HIGH (ref 9–35)

## 2021-07-12 LAB — LACTIC ACID, PLASMA: Lactic Acid, Venous: >11 mmol/L (ref 0.5–1.9)

## 2021-07-12 LAB — LIPASE, BLOOD: Lipase: 59 U/L — ABNORMAL HIGH (ref 11–51)

## 2021-07-12 MED ORDER — DEXTROSE 5 % IV SOLN: INTRAVENOUS | Status: DC

## 2021-07-12 MED ORDER — PANTOPRAZOLE SODIUM 40 MG IV SOLR: 40.0000 mg | Freq: Once | INTRAVENOUS | Status: DC

## 2021-07-12 MED ORDER — SODIUM CHLORIDE 0.9 % IV BOLUS
1000.0000 mL | Freq: Once | INTRAVENOUS | Status: AC
  Administered 2021-07-12: 1000 mL via INTRAVENOUS

## 2021-07-12 MED ORDER — EPINEPHRINE 0.1 MG/10ML (10 MCG/ML) SYRINGE FOR IV PUSH (FOR BLOOD PRESSURE SUPPORT)
5.0000 ug | PREFILLED_SYRINGE | Freq: Once | INTRAVENOUS | Status: DC | PRN
  Administered 2021-07-12: 10 ug via INTRAVENOUS
  Filled 2021-07-12: qty 10

## 2021-07-12 MED ORDER — FENTANYL CITRATE PF 50 MCG/ML IJ SOSY: 25.0000 ug | PREFILLED_SYRINGE | INTRAMUSCULAR | Status: DC | PRN

## 2021-07-12 MED ORDER — POLYVINYL ALCOHOL 1.4 % OP SOLN
1.0000 [drp] | Freq: Four times a day (QID) | OPHTHALMIC | Status: DC | PRN
  Filled 2021-07-12: qty 15

## 2021-07-12 MED ORDER — NOREPINEPHRINE 4 MG/250ML-% IV SOLN
INTRAVENOUS | Status: AC
  Administered 2021-07-12: 10 mg
  Filled 2021-07-12: qty 250

## 2021-07-12 MED ORDER — GLYCOPYRROLATE 1 MG PO TABS
1.0000 mg | ORAL_TABLET | ORAL | Status: DC | PRN
  Filled 2021-07-12: qty 1

## 2021-07-12 MED ORDER — GLYCOPYRROLATE 0.2 MG/ML IJ SOLN: 0.2000 mg | INTRAMUSCULAR | Status: DC | PRN

## 2021-07-12 MED ORDER — EPINEPHRINE HCL 5 MG/250ML IV SOLN IN NS
0.5000 ug/min | INTRAVENOUS | Status: DC
  Administered 2021-07-12: 30 ug/min via INTRAVENOUS
  Administered 2021-07-12: 15 ug/min via INTRAVENOUS

## 2021-07-12 MED ORDER — LACTATED RINGERS IV BOLUS
1000.0000 mL | Freq: Once | INTRAVENOUS | Status: AC
  Administered 2021-07-12: 1000 mL via INTRAVENOUS

## 2021-07-12 MED ORDER — SODIUM CHLORIDE 0.9 % IV SOLN: 10.0000 mL/h | Freq: Once | INTRAVENOUS | Status: DC

## 2021-07-13 LAB — BPAM RBC
Blood Product Expiration Date: 202210112359
Blood Product Expiration Date: 202210112359
Unit Type and Rh: 5100
Unit Type and Rh: 5100

## 2021-07-13 LAB — TYPE AND SCREEN
ABO/RH(D): O POS
Antibody Screen: NEGATIVE
Unit division: 0
Unit division: 0

## 2021-08-04 NOTE — Progress Notes (Signed)
Responded to page to support patient and family at bedside.  Daughter requested that Chaplain pray for her mother who is transitioning.  Prayer was done and also provided emotional support.  Will follow as Needed. Venida Jarvis, [place], [facility], Pager [phone]

## 2021-08-04 NOTE — ED Notes (Signed)
TOD 1406, confirmed by Gar Gibbon RN

## 2021-08-04 NOTE — ED Notes (Signed)
Family at bedside during extubation process. Brother and sister in law on the phone during process

## 2021-08-04 NOTE — ED Provider Notes (Signed)
[facility] EMERGENCY DEPARTMENT Provider Note   CSN: [id number] Arrival date & time: 07-30-21  4098     History Chief Complaint  Patient presents with   Post CPR    Kaylee King is a 80 y.o. female.  80 y/o female with hx of hypertension, hypothyroidism, NASH w/cirrhosis, portal hypertension, esophageal and gastric varices, AAA, anemia who is presenting today as a postarrest with EMS.  EMS reported that daughter saw her breathing laying on the couch at 630 this morning and around 730 she went to check on her and she was not breathing and and a dark emesis surrounding her on the couch.  Daughter called 911 and immediately started CPR.  When EMS arrived patient was in asystole.  She received 20 minutes of CPR with 4 rounds of epi and return of spontaneous circulation.  Patient has been on a King airway since that time with sats of 100%, low blood pressure on an epi drip through an IO line and normal blood sugar.  No further history was available.  When asked if patient was a DNR EMS reported that family members did not say anything about that at the scene.  The history is provided by the EMS personnel and medical records. The history is limited by the condition of the patient.      Past Medical History:  Diagnosis Date   Cirrhosis (HCC)    Hypertension    Hypothyroidism    Polymicrobial bacterial infection 01/08/2021   Thyroid disease    hypothyroid     Patient Active Problem List   Diagnosis Date Noted   DNR (do not resuscitate) 01/12/2021   Altered mental status    Cirrhosis (HCC)    Polymicrobial bacterial infection 01/08/2021   Sepsis (HCC) 01/06/2021   Acute lower UTI 01/06/2021   Normocytic anemia 01/06/2021   Dysphagia 01/06/2021   PEG (percutaneous endoscopic gastrostomy) status (HCC) 01/06/2021   Hypotension 01/06/2021   Acute respiratory failure with hypoxia and hypercapnia (HCC)    Respiratory failure (HCC)    Pressure injury of skin 11/27/2020    Acute metabolic encephalopathy 11/23/2020   GI bleed    Varices of esophagus determined by endoscopy (HCC)    Portal hypertension (HCC)    Acute gastric ulcer without hemorrhage or perforation    Hypothyroid 01/01/2012   HTN (hypertension) 01/01/2012    Past Surgical History:  Procedure Laterality Date   ESOPHAGEAL BANDING  11/23/2020   Procedure: ESOPHAGEAL BANDING;  Surgeon: Beverley Fiedler, MD;  Location: [facility] ENDOSCOPY;  Service: Gastroenterology;;   ESOPHAGEAL BANDING  01/10/2021   Procedure: ESOPHAGEAL BANDING;  Surgeon: Rachael Fee, MD;  Location: [facility] ENDOSCOPY;  Service: Endoscopy;;   ESOPHAGOGASTRODUODENOSCOPY N/A 11/23/2020   Procedure: ESOPHAGOGASTRODUODENOSCOPY (EGD);  Surgeon: Beverley Fiedler, MD;  Location: [facility] ENDOSCOPY;  Service: Gastroenterology;  Laterality: N/A;   ESOPHAGOGASTRODUODENOSCOPY (EGD) WITH PROPOFOL N/A 01/10/2021   Procedure: ESOPHAGOGASTRODUODENOSCOPY (EGD) WITH PROPOFOL;  Surgeon: Rachael Fee, MD;  Location: [facility] ENDOSCOPY;  Service: Endoscopy;  Laterality: N/A;   IR GASTROSTOMY TUBE MOD SED  12/18/2020   RADIOLOGY WITH ANESTHESIA N/A 12/12/2020   Procedure: MRI WITH ANESTHESIA;  Surgeon: Radiologist, Medication, MD;  Location: [facility] OR;  Service: Radiology;  Laterality: N/A;     OB History   No obstetric history on file.     History reviewed. No pertinent family history.  Social History   Tobacco Use   Smoking status: Former    Types: Cigarettes    Quit  date: 12/31/1981    Years since quitting: 39.5   Smokeless tobacco: Never  Vaping Use   Vaping Use: Never used  Substance Use Topics   Alcohol use: Never   Drug use: Never    Home Medications Prior to Admission medications   Medication Sig Start Date End Date Taking? Authorizing Provider  acetaminophen (TYLENOL) 325 MG tablet Place 2 tablets (650 mg total) into feeding tube every 6 (six) hours as needed for headache, fever or mild pain. 12/19/20   Danford, Earl Liteshristopher P, MD  cephALEXin (KEFLEX) 500  MG capsule Take 1 capsule (500 mg total) by mouth 4 (four) times daily. 02/08/21   Cathren LaineSteinl, Kevin, MD  lactulose (CHRONULAC) 10 GM/15ML solution Take 45 g by mouth 2 (two) times daily.    [provider]  levothyroxine (SYNTHROID) 150 MCG tablet Place 1 tablet (150 mcg total) into feeding tube daily before breakfast. 01/13/21 02/12/21  Arrien, York RamMauricio Daniel, MD  magic mouthwash SOLN Take 15 mLs by mouth 4 (four) times daily.    [provider]  morphine 20 MG/5ML solution Take 10 mg by mouth every 4 (four) hours as needed for pain.    [provider]  Nutritional Supplements (FEEDING SUPPLEMENT, JEVITY 1.2 CAL,) LIQD Place 1,000 mLs into feeding tube daily.    [provider]  nystatin ointment (MYCOSTATIN) Apply 1 application topically in the morning and at bedtime. Apply to affected area(s) on groin    [provider]  OXYGEN Inhale 2-3 L into the lungs as needed (shortness of breath whle lying down).    [provider]  pantoprazole (PROTONIX) 40 MG tablet Take 40 mg by mouth daily. Via tube    [provider]  rifaximin (XIFAXAN) 550 MG TABS tablet Take 550 mg by mouth 2 (two) times daily.    [provider]  spironolactone (ALDACTONE) 25 MG tablet Take 25 mg by mouth daily.    [provider]  thiamine (VITAMIN B-1) 100 MG tablet Take 100 mg by mouth daily.    [provider]    Allergies    Patient has no known allergies.  Review of Systems   Review of Systems  Unable to perform ROS: Patient unresponsive   Physical Exam Updated Vital Signs BP (!) 117/55   Pulse 82   Temp (!) 96.5 F (35.8 C) (Temporal)   Resp 15   Ht 5\' 4"  (1.626 m)   Wt 63.5 kg   SpO2 100%   BMI 24.03 kg/m   Physical Exam Vitals and nursing note reviewed.  Constitutional:      Appearance: She is overweight. She is ill-appearing.     Interventions: She is intubated.  HENT:     Head: Normocephalic.     Nose: Nose  normal.  Cardiovascular:     Rate and Rhythm: Normal rate.  Pulmonary:     Effort: She is intubated.     Comments: Breath sounds clear when bagging but no spontaneous breathing Abdominal:     General: Abdomen is flat.     Comments: G-tube in place with dark contents  Musculoskeletal:     Cervical back: Neck supple.     Right lower leg: No edema.     Left lower leg: No edema.  Skin:    General: Skin is dry.     Coloration: Skin is pale.  Neurological:     Comments: Unresponsive.  Does not respond to stimuli and able to intubate without medications.  Pupils are  69mm and unreactive bilaterally  Psychiatric:     Comments: unresponsive    ED Results / Procedures / Treatments   Labs (all labs ordered are listed, but only abnormal results are displayed) Labs Reviewed  CBC WITH DIFFERENTIAL/PLATELET - Abnormal; Notable for the following components:      Result Value   WBC 16.3 (*)    RBC 2.24 (*)    Hemoglobin 6.8 (*)    HCT 24.6 (*)    MCV 109.8 (*)    MCHC 27.6 (*)    RDW 21.4 (*)    nRBC 1.3 (*)    All other components within normal limits  BRAIN NATRIURETIC PEPTIDE - Abnormal; Notable for the following components:   B Natriuretic Peptide 484.3 (*)    All other components within normal limits  LACTIC ACID, PLASMA - Abnormal; Notable for the following components:   Lactic Acid, Venous >11.0 (*)    All other components within normal limits  AMMONIA - Abnormal; Notable for the following components:   Ammonia 163 (*)    All other components within normal limits  I-STAT VENOUS BLOOD GAS, ED - Abnormal; Notable for the following components:   pH, Ven 7.067 (*)    pCO2, Ven 37.9 (*)    pO2, Ven 84.0 (*)    Bicarbonate 10.9 (*)    TCO2 12 (*)    Acid-base deficit 18.0 (*)    Sodium 147 (*)    Calcium, Ion 1.12 (*)    HCT 23.0 (*)    Hemoglobin 7.8 (*)    All other components within normal limits  SARS CORONAVIRUS 2 (TAT 6-24 HRS)  URINALYSIS, ROUTINE W REFLEX MICROSCOPIC   COMPREHENSIVE METABOLIC PANEL  LIPASE, BLOOD  TYPE AND SCREEN  PREPARE RBC (CROSSMATCH)  TROPONIN I (HIGH SENSITIVITY)  TROPONIN I (HIGH SENSITIVITY)    EKG EKG Interpretation  Date/Time:  Thursday 08-11-2021 08:44:06 EDT Ventricular Rate:  66 PR Interval:  221 QRS Duration: 110 QT Interval:  466 QTC Calculation: 489 R Axis:   5 Text Interpretation: Sinus rhythm Prolonged PR interval Anteroseptal infarct, age indeterminate Confirmed by Gwyneth Sprout ([id number]) on 08-11-2021 8:56:12 AM  Radiology DG Chest Port 1 View  Result Date: 2021/08/11 CLINICAL DATA:  Tube placement EXAM: PORTABLE CHEST - 1 VIEW COMPARISON:  01/06/2021 FINDINGS: Endotracheal tube tip 4.4 cm above carina. Nasogastric tube extends into the decompressed stomach. Coarse bilateral interstitial markings, stable. No new infiltrate. Heart size and mediastinal contours are within normal limits. Aortic Atherosclerosis (ICD10-170.0). Blunting of the left lateral costophrenic angle.  No pneumothorax. Visualized bones unremarkable. IMPRESSION: 1. Support hardware in expected location. 2. Chronic coarse interstitial opacities without acute findings. Electronically Signed   By: Corlis Leak M.D.   On: 2021-08-11 09:31    Procedures Procedure Name: Intubation Date/Time: 2021/08/11 10:19 AM Performed by: Gwyneth Sprout, MD Pre-anesthesia Checklist: Patient identified, Patient being monitored, Timeout performed, Suction available and Emergency Drugs available Oxygen Delivery Method: Ambu bag Preoxygenation: Pre-oxygenation with 100% oxygen Ventilation: Mask ventilation without difficulty and Oral airway inserted - appropriate to patient size Laryngoscope Size: 3 Tube size: 7.5 mm Number of attempts: 1 Placement Confirmation: ETT inserted through vocal cords under direct vision, CO2 detector and Breath sounds checked- equal and bilateral Secured at: 21 cm Tube secured with: ETT holder Dental Injury: Teeth and  Oropharynx as per pre-operative assessment  Difficulty Due To: Difficulty was unanticipated      Medications Ordered in ED Medications  EPINEPHrine (ADRENALIN) 5 mg in  NS 250 mL (0.02 mg/mL) premix infusion (15 mcg/min Intravenous Rate/Dose Change August 06, 2021 1010)  lactated ringers bolus 1,000 mL (1,000 mLs Intravenous New Bag/Given 08/06/2021 1010)  norepinephrine (LEVOPHED) 4-5 MG/250ML-% infusion SOLN (10 mg  New Bag/Given August 06, 2021 0911)  sodium chloride 0.9 % bolus 1,000 mL (0 mLs Intravenous Stopped 2021/08/06 0959)    ED Course  I have reviewed the triage vital signs and the nursing notes.  Pertinent labs & imaging results that were available during my care of the patient were reviewed by me and considered in my medical decision making (see chart for details).    MDM Rules/Calculators/A&P                           Patient is an elderly 80 year old female with multiple medical problems including Elita Boone cirrhosis and varices as well as chronic respiratory failure who presents today after return of spontaneous circulation after cardiac arrest.  Patient had an unknown downtime.  On exam patient is showing no signs of life and was able to be intubated without any medications.  Oxygen has been 100%.  Patient has been extremely hypotensive since arrival to the emergency room.  She initially was on an epi drip from EMS and 2 peripheral lines were placed and she was started on Levophed which is at 10 currently and epinephrine is being weaned down and is currently at 20.  Blood pressure approximately 115/60 with maps in the 70s currently.  Patient has not received any sedating medication and is still not responding.  VBG shows a pH of 7.06 and a CO2 of 37.  Her bicarb is 10 and concern for metabolic acidosis.  Hemoglobin today shows blood count of 6 and stool is slightly bloody in nature and concern for GI bleed.  Blood was ordered.  When looking through the chart patient was seen by palliative care in June and  at that time she was made a DNR.  However at this time I have not been able to contact her daughter Elita Quick to confirm if she would want to continue care or withdrawal care.  Spoke with ICU who will take over care of the patient.  We will continue to try to contact family.  Lactate today is greater than 11.  Started on protonix gtt.  MDM   Amount and/or Complexity of Data Reviewed Clinical lab tests: reviewed and ordered Tests in the radiology section of CPT: ordered and reviewed Tests in the medicine section of CPT: ordered and reviewed Decide to obtain previous medical records or to obtain history from someone other than the patient: yes Obtain history from someone other than the patient: yes Review and summarize past medical records: yes Discuss the patient with other providers: yes Independent visualization of images, tracings, or specimens: yes  Risk of Complications, Morbidity, and/or Mortality Presenting problems: high Diagnostic procedures: high Management options: high   CRITICAL CARE Performed by: Westlynn Fifer Total critical care time: 40 minutes Critical care time was exclusive of separately billable procedures and treating other patients. Critical care was necessary to treat or prevent imminent or life-threatening deterioration. Critical care was time spent personally by me on the following activities: development of treatment plan with patient and/or surrogate as well as nursing, discussions with consultants, evaluation of patient's response to treatment, examination of patient, obtaining history from patient or surrogate, ordering and performing treatments and interventions, ordering and review of laboratory studies, ordering and review of radiographic studies, pulse oximetry  and re-evaluation of patient's condition.  Final Clinical Impression(s) / ED Diagnoses Final diagnoses:  Cardiac arrest [facility])  Gastrointestinal hemorrhage, unspecified gastrointestinal hemorrhage  type  Hemorrhagic shock [facility])    Rx / DC Orders ED Discharge Orders     None        Gwyneth Sprout, MD 2021-07-29 1030

## 2021-08-04 NOTE — Progress Notes (Signed)
Pt one way extubated with RN and daughter at bedside.

## 2021-08-04 NOTE — ED Notes (Signed)
Daughter at bedside and present for TOD. No questions at this time. Will allow family time with pt

## 2021-08-04 NOTE — ED Triage Notes (Signed)
BIB EMS from home. Witnessed by family to stop breathing around 630am. EMS on scene started CRP for about 15 minutes ginivbg 4 rounds Epi. Patient is breathing on here own at this time.

## 2021-08-04 NOTE — ED Notes (Addendum)
Pt extubated, gtts stopped, monitor switched to comfort care

## 2021-08-04 NOTE — Consult Note (Addendum)
NAME:  Kaylee King, MRN:  [id number], DOB:  [birth year], LOS: 0 ADMISSION DATE:  18-Jul-2021, CONSULTATION DATE:  18-Jul-2021 REFERRING MD:  Maryan Rued CHIEF COMPLAINT:  Cardiac Arrest   History of Present Illness:  Kaylee King is a 80 y.o. female who has a PMH as outlined below.  She was in her usual state of health PM 9/7 and went to bed.  Daughter saw her the following morning 9/8 at 0630 (she sleeps on the coach) and noted that she appeared comfortable and was breathing normally.  She then returned at 0730 and saw that she had urinated on herself and was also not breathing.  She assisted her to the floor and called EMS as she started CPR.  Police were the first on scene and arrived a few minutes later.  EMS then arrived and found pt to be in asystole.  She had at least 20 minutes of CPR / ACLS before ROSC.  She was transported to ED where she remained unresponsive.  She was intubated without sedation.   Labs noteable for lactic acidosis, AKI, hyperglycemia, anemia.  She was in profound shock and required both epinephrine and norepienphrine infusions to maintain SBP > 90.   On my exam, she is comatose without any sedation.  Pupils are equal but unresponsive.  She has no dolls eye or corneals and no gag or cough. She has very minimal spontaneous respiratory effort with a rate of roughly 4 - 5 and Vt 150-200 without full vent support.  She was noted to have large amount of blood on her right forearm, likely from venipuncture and hematoma earlier.  Per chart review, she had admission 01/06/21 through 01/12/21 for severe sepsis 2/2 UTI complicated by right psoas hematoma.  She was discharged to SNF.  While at [facility], she met with palliative care 3/15 and expressed her wishes for DNR.  Further palliative care notes through 6/10 continue to document DNR.  Given above, I met with daughter Kaylee King at bedside and son Kaylee King on speaker phone. I had an extensive discussion with both of them where we discussed Kaylee King's  current circumstances, organ failures, and extremely poor prognosis. We also discussed patient's prior wishes under circumstances such as this and the fact that she had expressed her wishes for DNR. In light of this, the family has decided to offer full comfort care for Kaylee King. They have been fully updated on the process and expectations and all questions have been answered.  Emotional support was offered and chaplain will be called.  Orders placed and RN updated on plan of care.    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  Significant Hospital Events: Including procedures, antibiotic start and stop dates in addition to other pertinent events   9/8 > admit, transitioned to comfort care and terminal extubation in ED.  Interim History / Subjective:  On vent, unresponsive.  No cough, gag, corneals.  Pupils non-responsive.  Minimal spontaneous respiratory effort.  Objective:  Blood pressure (!) 95/47, pulse 78, temperature (!) 92 F (33.3 C), resp. rate 15, height '5\' 4"'  (1.626 m),  weight 63.5 kg, SpO2 100 %.    Vent Mode: PRVC FiO2 (%):  [100 %] 100 % Set Rate:  [15 bmp] 15 bmp Vt Set:  [420 mL] 420 mL PEEP:  [5 cmH20] 5 cmH20 Plateau Pressure:  [15 cmH20] 15 cmH20   Intake/Output Summary (Last 24 hours) at 05-Aug-2021 1129 Last data filed at 2021/08/05 0959 Gross per 24 hour  Intake 1000 ml  Output 120 ml  Net 880 ml   Filed  Weights   2021-08-05 0845  Weight: 63.5 kg    Examination: General: Elderly female, critically ill. Neuro: Unresponsive on vent.  GCS 3.  Pupils unresponsive.  No corneals, dolls eye, cough, gag. HEENT: [place]/AT. Sclerae anicteric. ETT in place. Cardiovascular: RRR, no M/R/G.  Lungs: Respirations even and unlabored.  CTA bilaterally, No W/R/R. Abdomen: PEG C/D/I. BS x 4, soft, NT/ND.  Musculoskeletal: No gross deformities, no edema.  Skin: Intact, warm, no rashes.  Assessment & Plan:   Cardiac Arrest with prolonged downtime (POA). Acute hypoxic respiratory failure (POA) - 2/2 above. Shock (POA) - presumed primarily cardiogenic but can not rule out hemorrhagic component given drop in Hgb. Lactic acidosis , AKI- 2/2 above. DNR status. Anemia acute on chronic. Hx cirrhosis, esophageal varices, portal HTN, dysphagia s/p PEG, HTN, hypothyroidism.  Discussion: I met with daughter Kaylee King at bedside and son Kaylee King on speaker phone. I had an extensive discussion with both of them where we discussed Kaylee King's current circumstances, organ failures, and extremely poor prognosis. We also discussed patient's prior wishes under circumstances such as this and the fact that she had expressed her wishes for DNR. In light of this, the family has decided to offer full comfort care for Kaylee King. They have been fully updated on the process and expectations and all questions have been answered.  Emotional support was offered and chaplain will be called. RN updated on care plan. Expect in hospital death shortly after extubation and vasopressors are weaned (suspect minutes).   Labs   CBC: Recent Labs  Lab 08/05/21 0858 2021-08-05 0941  WBC 16.3*  --   NEUTROABS 15.0*  --   HGB 6.8* 7.8*  HCT 24.6* 23.0*  MCV 109.8*  --   PLT 384  --     Basic Metabolic Panel: Recent Labs  Lab 2021/08/05 0858 08/05/21 0941  NA 144 147*  K 4.4 4.2  CL 113*  --   CO2 13*  --   GLUCOSE 287*  --   BUN 35*  --    CREATININE 1.68*  --   CALCIUM 8.6*  --    GFR: Estimated Creatinine Clearance: 22.3 mL/min (A) (by C-G formula based on SCr of 1.68 mg/dL (H)). Recent Labs  Lab 08-05-21 0858 2021-08-05 0930  WBC 16.3*  --   LATICACIDVEN  --  >11.0*    Liver Function Tests: Recent Labs  Lab 08/05/21 0858  AST 100*  ALT <5  ALKPHOS 77  BILITOT 0.8  PROT 6.3*  ALBUMIN 2.6*   Recent Labs  Lab 2021/08/05 0858  LIPASE 59*   Recent Labs  Lab 2021/08/05 0930  AMMONIA 163*    ABG    Component Value Date/Time   PHART 7.430 12/12/2020 1542   PCO2ART 40.5 12/12/2020 1542   PO2ART 55.2 (L) 12/12/2020 1542   HCO3 10.9 (L) 2021-08-05 0941   TCO2 12 (L) 05-Aug-2021 0941   ACIDBASEDEF 18.0 (H) August 05, 2021 0941   O2SAT 91.0 August 05, 2021 0941     Coagulation Profile: No results for  input(s): INR, PROTIME in the last 168 hours.  Cardiac Enzymes: No results for input(s): CKTOTAL, CKMB, CKMBINDEX, TROPONINI in the last 168 hours.  HbA1C: No results found for: HGBA1C  CBG: No results for input(s): GLUCAP in the last 168 hours.  Review of Systems:   Unable to obtain as pt is encephalopathic.  Past Medical History:  She,  has a past medical history of Cirrhosis ([place]), Hypertension, Hypothyroidism, Polymicrobial bacterial infection (01/08/2021), and Thyroid disease.   Surgical History:   Past Surgical History:  Procedure Laterality Date   ESOPHAGEAL BANDING  11/23/2020   Procedure: ESOPHAGEAL BANDING;  Surgeon: Jerene Bears, MD;  Location: [facility] ENDOSCOPY;  Service: Gastroenterology;;   ESOPHAGEAL BANDING  01/10/2021   Procedure: ESOPHAGEAL BANDING;  Surgeon: Milus Banister, MD;  Location: [facility] ENDOSCOPY;  Service: Endoscopy;;   ESOPHAGOGASTRODUODENOSCOPY N/A 11/23/2020   Procedure: ESOPHAGOGASTRODUODENOSCOPY (EGD);  Surgeon: Jerene Bears, MD;  Location: [facility] ENDOSCOPY;  Service: Gastroenterology;  Laterality: N/A;   ESOPHAGOGASTRODUODENOSCOPY (EGD) WITH PROPOFOL N/A 01/10/2021   Procedure:  ESOPHAGOGASTRODUODENOSCOPY (EGD) WITH PROPOFOL;  Surgeon: Milus Banister, MD;  Location: [facility] ENDOSCOPY;  Service: Endoscopy;  Laterality: N/A;   IR GASTROSTOMY TUBE MOD SED  12/18/2020   RADIOLOGY WITH ANESTHESIA N/A 12/12/2020   Procedure: MRI WITH ANESTHESIA;  Surgeon: Radiologist, Medication, MD;  Location: [facility];  Service: Radiology;  Laterality: N/A;     Social History:   reports that she quit smoking about 39 years ago. She has never used smokeless tobacco. She reports that she does not drink alcohol and does not use drugs.   Family History:  Her family history is not on file.   Allergies No Known Allergies   Home Medications  Prior to Admission medications   Medication Sig Start Date End Date Taking? Authorizing Provider  acetaminophen (TYLENOL) 325 MG tablet Place 2 tablets (650 mg total) into feeding tube every 6 (six) hours as needed for headache, fever or mild pain. 12/19/20   Danford, Suann Larry, MD  cephALEXin (KEFLEX) 500 MG capsule Take 1 capsule (500 mg total) by mouth 4 (four) times daily. Patient not taking: Reported on 07-Aug-2021 02/08/21   Lajean Saver, MD  EUTHYROX 50 MCG tablet Take 50 mcg by mouth every morning. 07/02/21   [provider]  lactulose (CHRONULAC) 10 GM/15ML solution Take 45 g by mouth 2 (two) times daily.    [provider]  levothyroxine (SYNTHROID) 150 MCG tablet Place 1 tablet (150 mcg total) into feeding tube daily before breakfast. Patient not taking: Reported on 2021-08-07 01/13/21 09/08/21  Arrien, Jimmy Picket, MD  magic mouthwash SOLN Take 15 mLs by mouth 4 (four) times daily.    [provider]  morphine 20 MG/5ML solution Take 10 mg by mouth every 4 (four) hours as needed for pain.    [provider]  nitrofurantoin, macrocrystal-monohydrate, (MACROBID) 100 MG capsule Take 100 mg by mouth 2 (two) times daily. 07/02/21   [provider]  Nutritional Supplements (FEEDING SUPPLEMENT, JEVITY 1.2 CAL,) LIQD  Place 1,000 mLs into feeding tube daily.    [provider]  nystatin ointment (MYCOSTATIN) Apply 1 application topically 2 (two) times daily as needed (affected areas of groin).    [provider]  OXYGEN Inhale 2-3 L into the lungs as needed (shortness of breath whle lying down).    [provider]  pantoprazole (PROTONIX) 40 MG tablet Take 40 mg by mouth daily.    [provider]  rifaximin Doreene Nest) 550  MG TABS tablet Take 550 mg by mouth 2 (two) times daily.    [provider]  spironolactone (ALDACTONE) 25 MG tablet Take 25 mg by mouth daily.    [provider]  thiamine (VITAMIN B-1) 100 MG tablet Take 100 mg by mouth daily.    [provider]     Critical care time: 45 min.   Montey Hora, [facility] For pager details, please see AMION or use Epic chat  After 1900, please call [place] for cross coverage needs July 14, 2021, 11:29 AM

## 2021-08-04 DEATH — deceased

## 2022-06-13 IMAGING — DX DG CHEST 1V PORT
1 series · 1 of 1 positions shown · non-contrast
Comparison: None.

CLINICAL DATA: Endotracheal tube placement.

EXAM:
PORTABLE CHEST 1 VIEW

[chest]
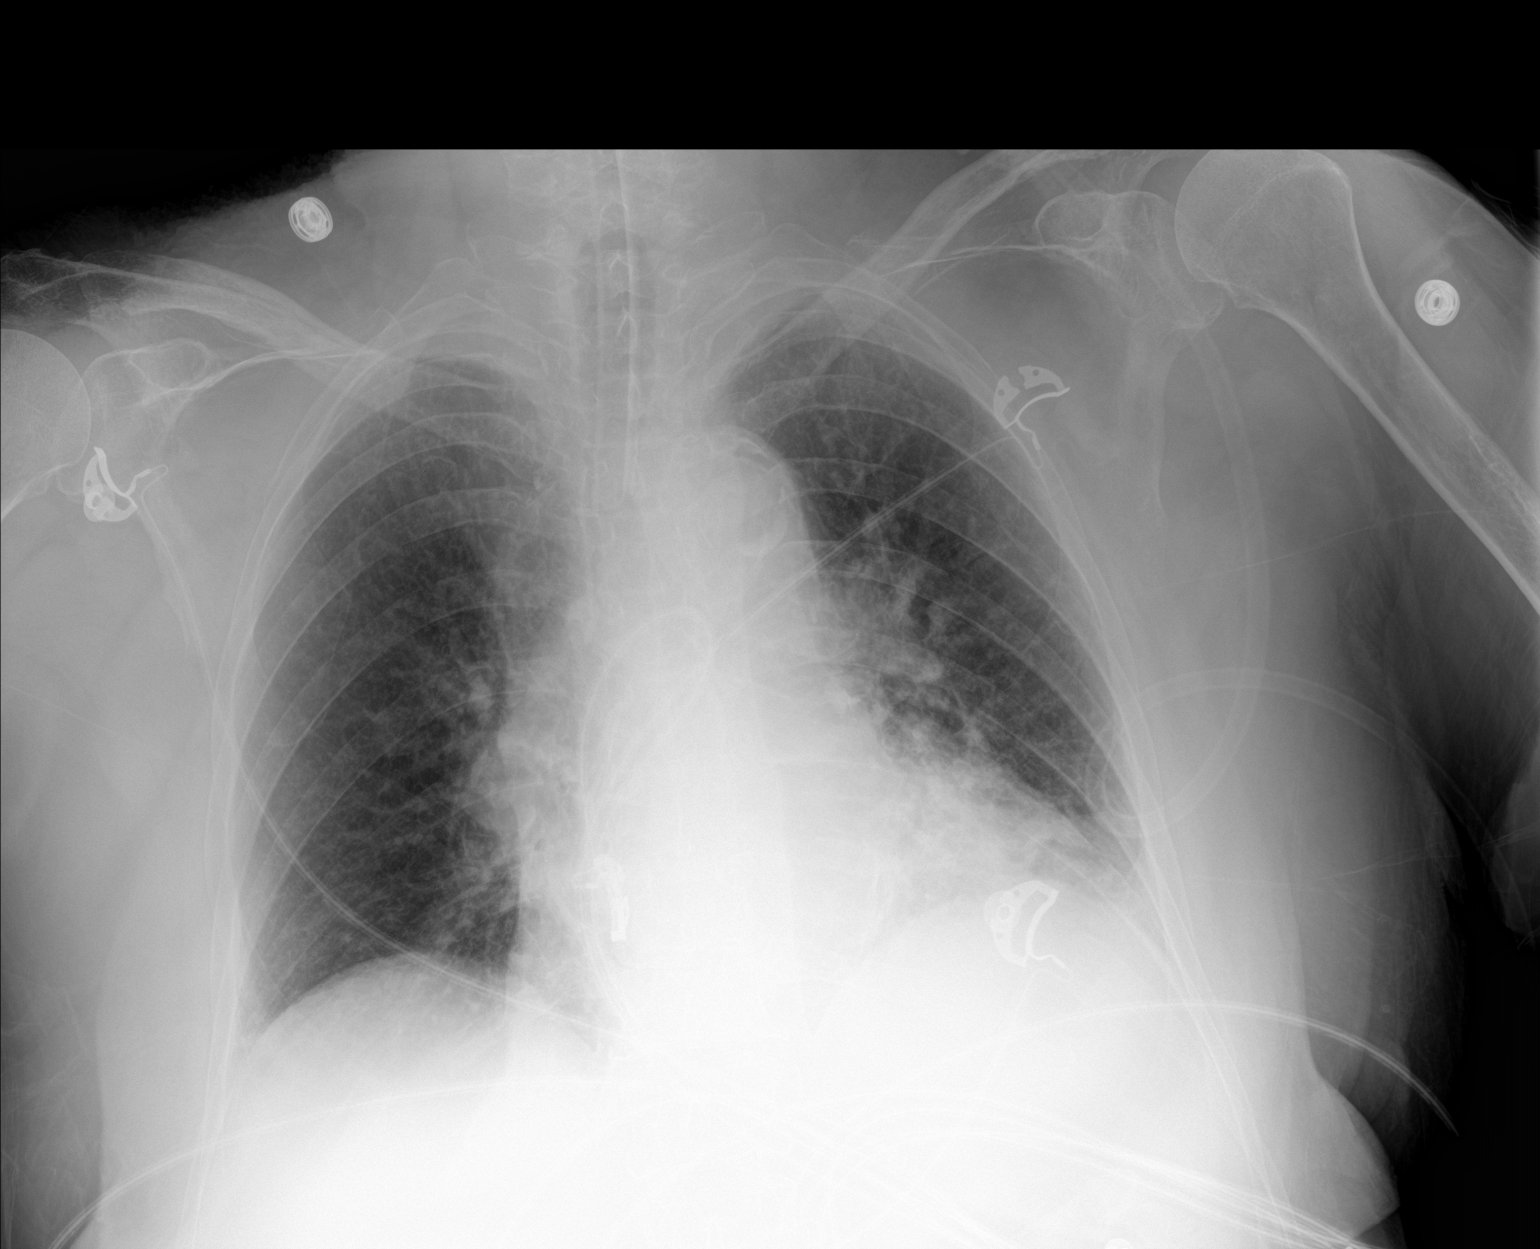

[1 of 1 positions shown; findings below may reference images not displayed]

FINDINGS: An endotracheal tube is seen with its distal tip approximately
cm from the carina. Mild atelectasis and/or infiltrate is seen
within the left lung base. This is increased in severity when
compared to the prior study. There is no evidence of a pleural
effusion or pneumothorax. The heart size and mediastinal contours
are within normal limits. The visualized skeletal structures are
unremarkable.
IMPRESSION: 1. Endotracheal tube in good position.
2. Mild left basilar atelectasis and/or infiltrate, increased in
severity when compared to the prior study.

## 2022-06-13 IMAGING — DX DG CHEST 1V PORT
1 series · 1 of 1 positions shown · non-contrast
Comparison: None.

CLINICAL DATA: Weakness declined appetite increased confusion x2
days

EXAM:
PORTABLE CHEST 1 VIEW

[chest]
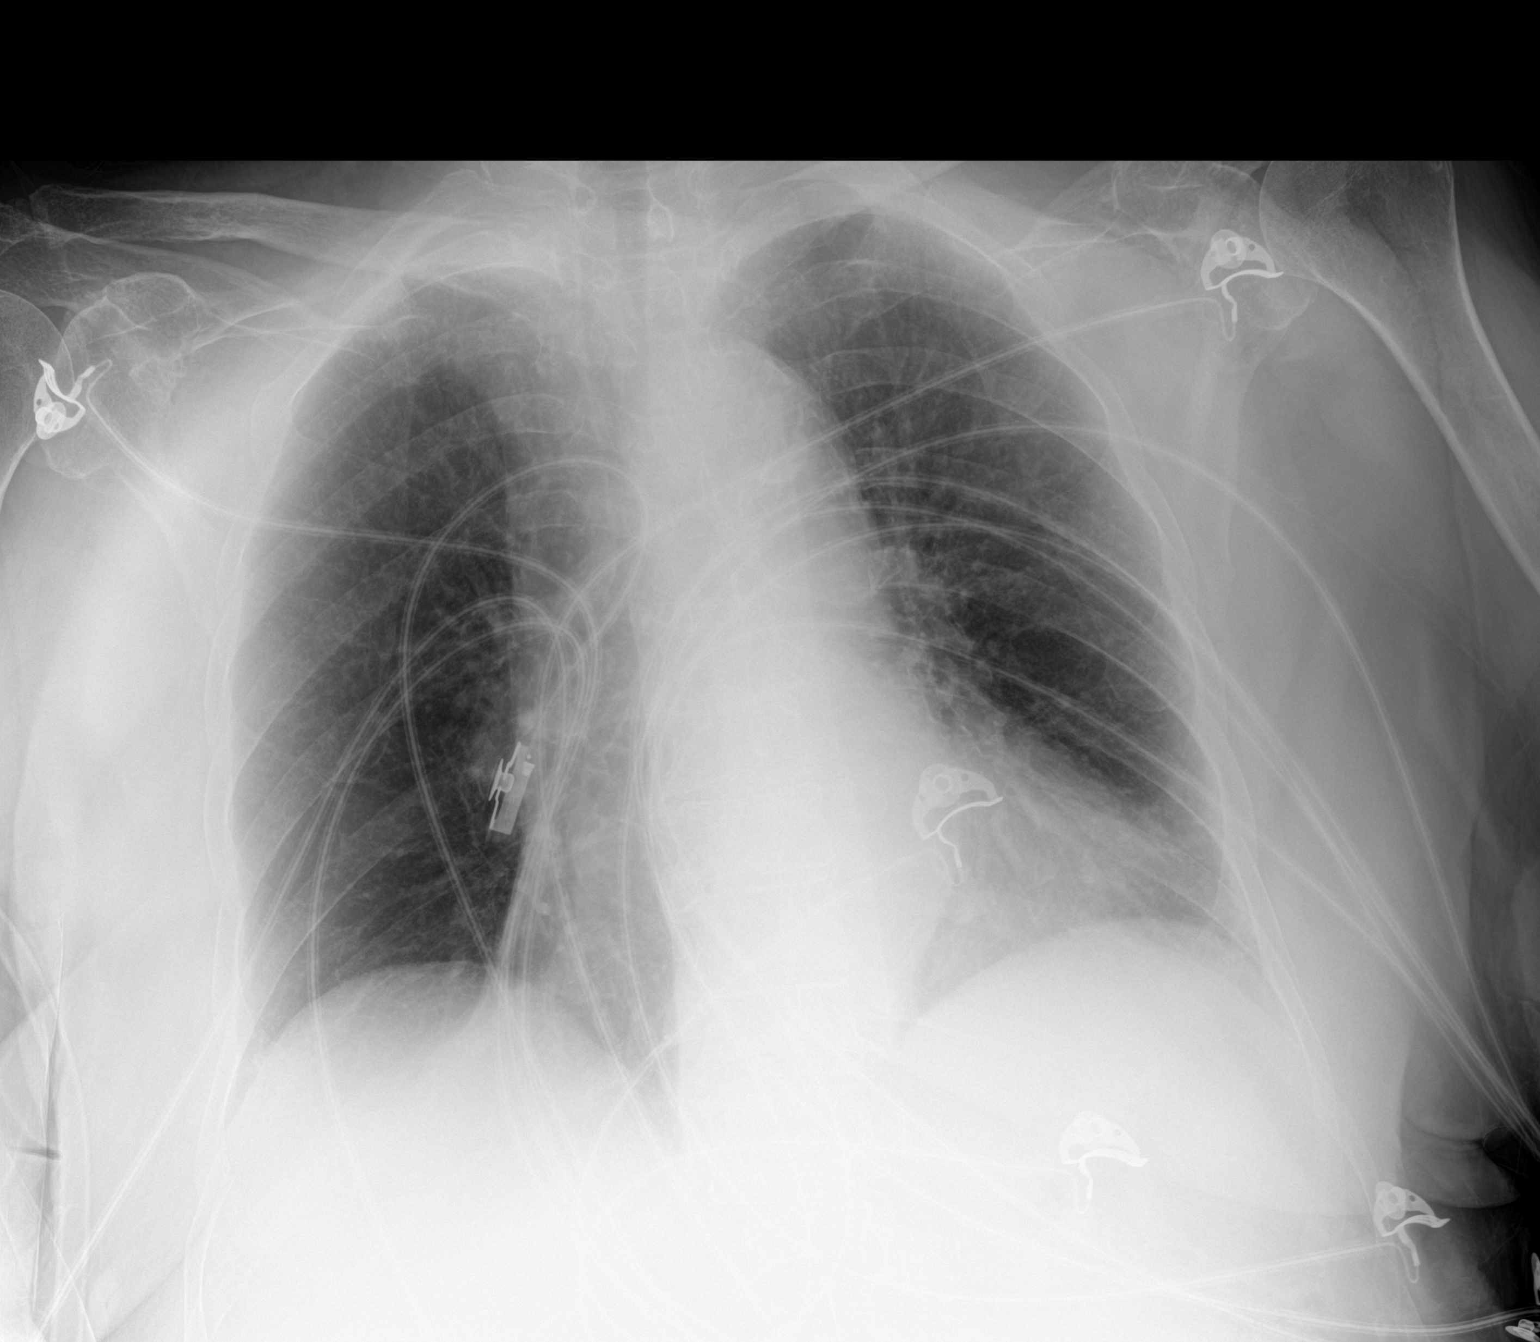

[1 of 1 positions shown; findings below may reference images not displayed]

FINDINGS: The heart size and mediastinal contours are mildly enlarged,
accentuated by technique. Aortic atherosclerosis. Streaky left lower
lobe opacity. No pleural effusion or pneumothorax. Thoracic
spondylosis and bilateral AC joints DJD.
IMPRESSION: Streaky left lower lobe opacity, likely atelectasis but pneumonia
not excluded.

## 2022-06-13 IMAGING — DX DG CHEST 1V PORT
1 series · 1 of 1 positions shown · non-contrast
Comparison: 11/23/2020

CLINICAL DATA: Post central line placement

EXAM:
PORTABLE CHEST 1 VIEW

[chest]
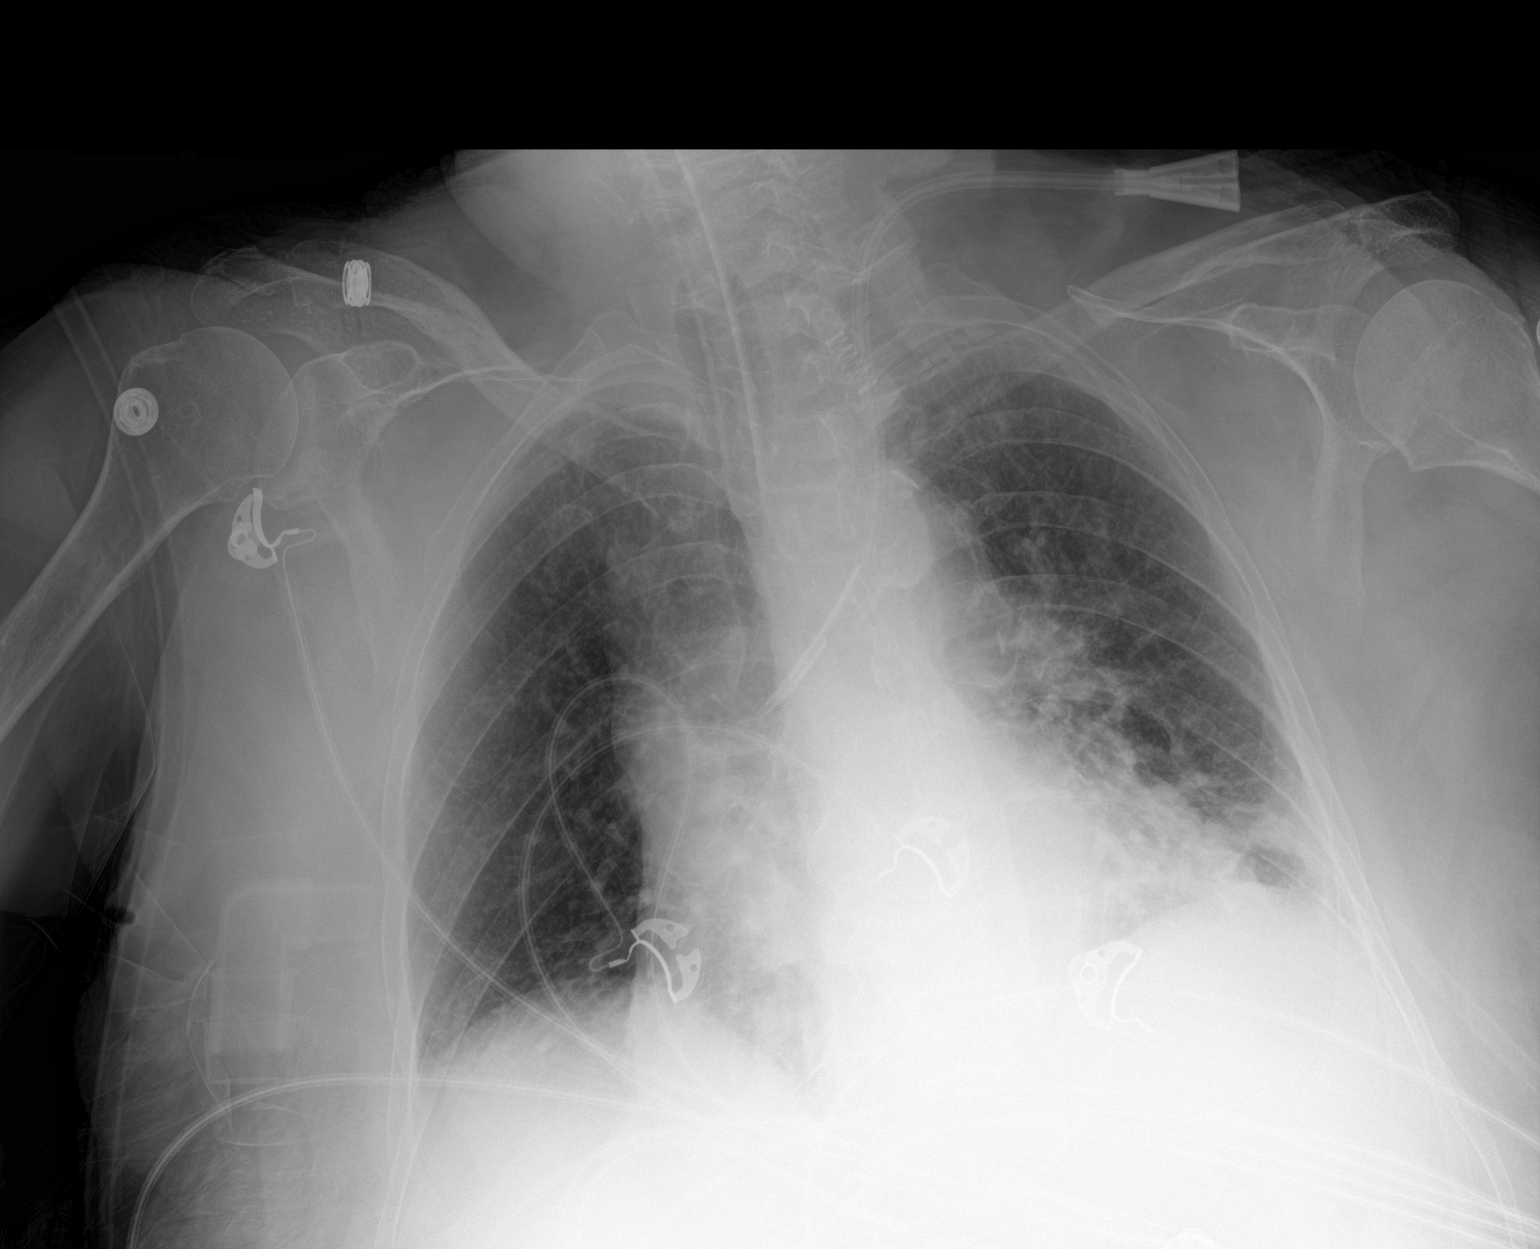

[1 of 1 positions shown; findings below may reference images not displayed]

FINDINGS: Interval intubation, tip of the endotracheal tube is about 3.8 cm
superior to the carina. Left-sided central venous catheter with tip
projecting over the SVC origin. Low lung volumes. Airspace disease
at the left base. Enlarged cardiomediastinal silhouette with aortic
atherosclerosis. No pneumothorax.
IMPRESSION: 1. Interval intubation with tip of endotracheal tube about 3.8 cm
superior to the carina. Left-sided central venous catheter tip
overlies the SVC origin. No pneumothorax
2. Low lung volumes with left basilar airspace disease which may
reflect atelectasis, pneumonia or aspiration

## 2022-06-14 IMAGING — DX DG CHEST 1V PORT
1 series · 1 of 1 positions shown · non-contrast
Comparison: November 23, 2020

CLINICAL DATA: Hypoxia

EXAM:
PORTABLE CHEST 1 VIEW

[chest ap]
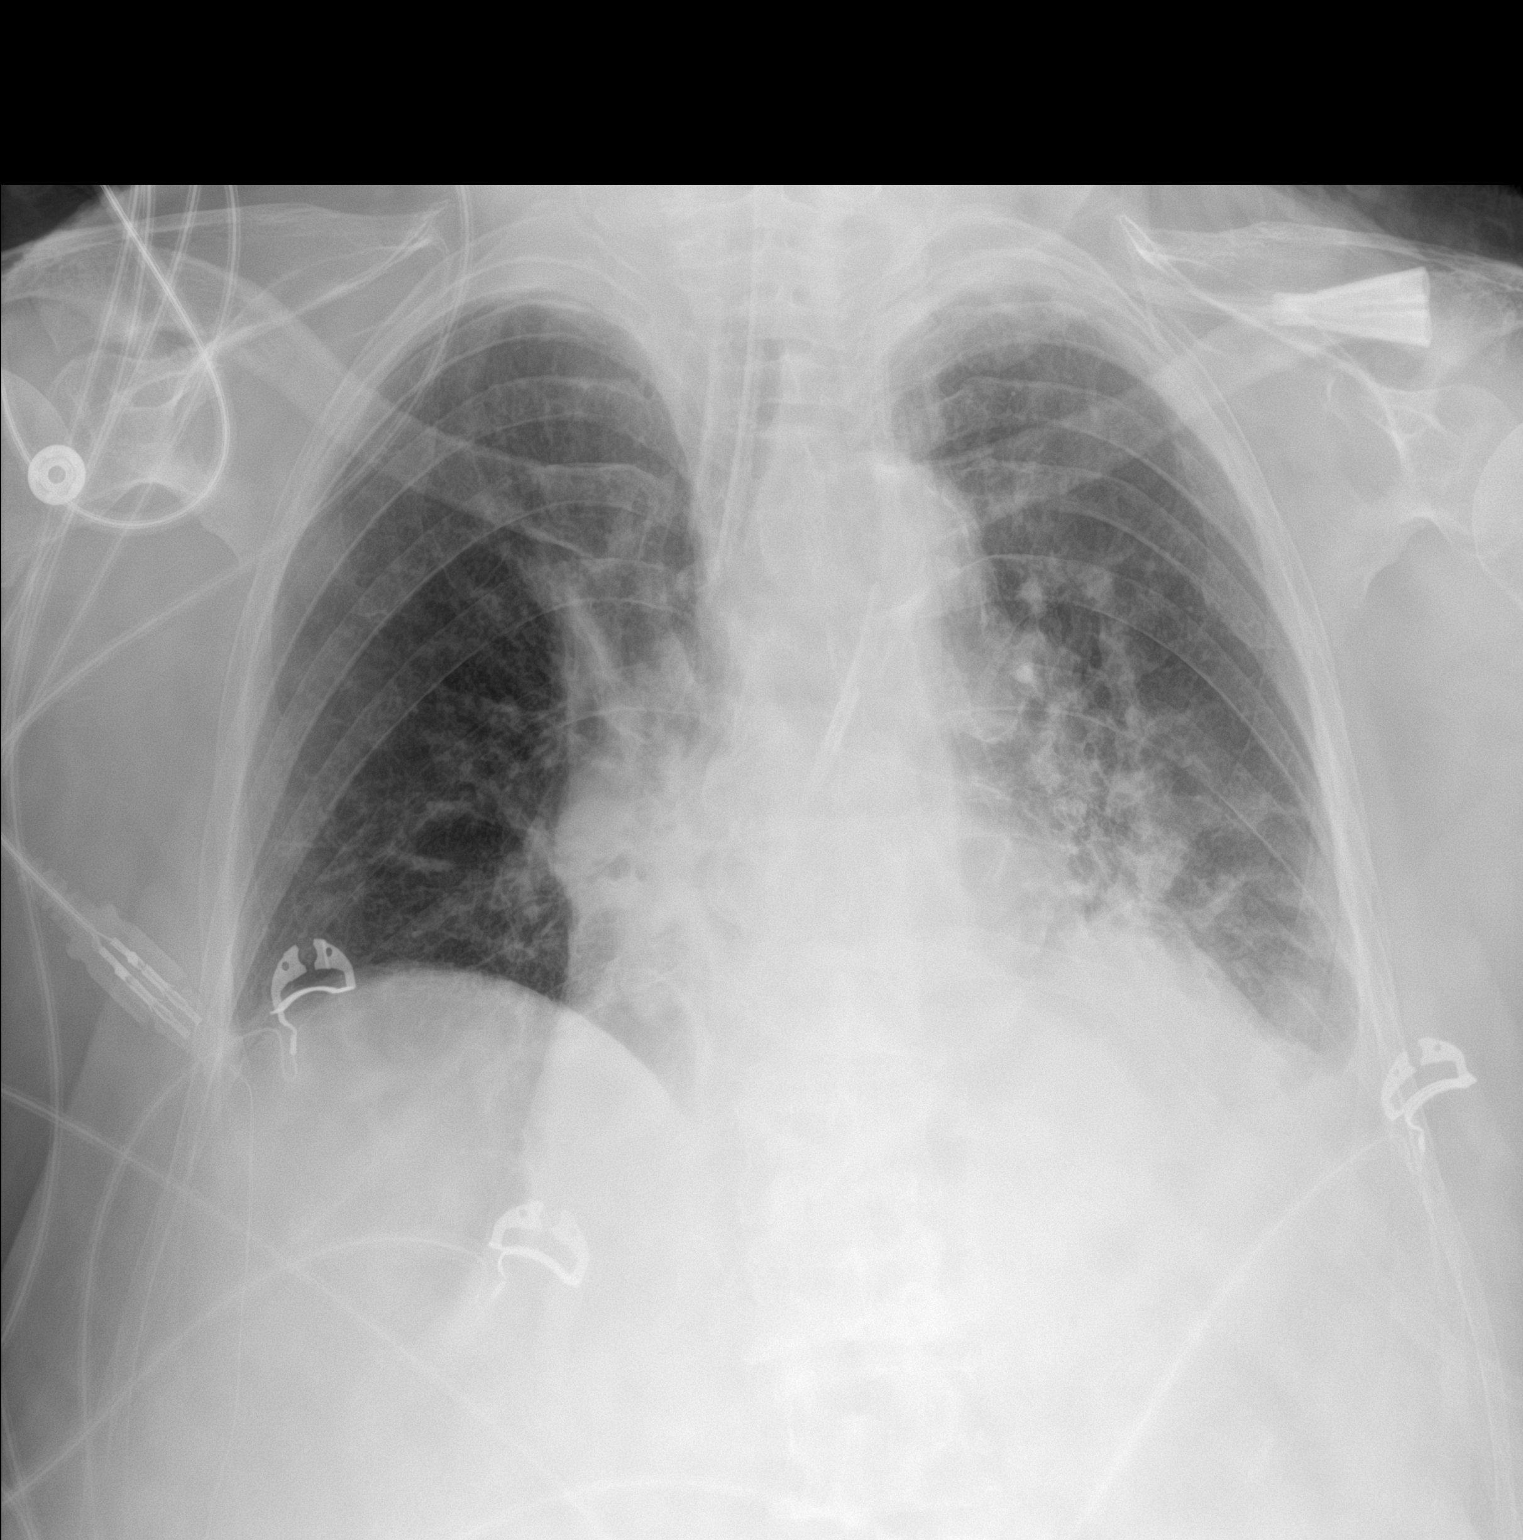

[1 of 1 positions shown; findings below may reference images not displayed]

FINDINGS: Endotracheal tube tip is 1.4 cm above the carina. Central catheter
tip in region of left innominate vein, stable. No pneumothorax.
There is ill-defined opacity in the left base, similar to 1 day
prior. Areas of atelectatic change noted in each perihilar region.
Heart is mildly enlarged with pulmonary vascular normal. No
adenopathy. There is aortic atherosclerosis. No bone lesions.
IMPRESSION: Tube and catheter positions as described without pneumothorax.
Ill-defined opacity left base concerning for focal pneumonia with
atelectasis. There is perihilar atelectatic change as well. Stable
cardiac silhouette.

Aortic Atherosclerosis ([id number]).

## 2022-07-28 IMAGING — CT CT ABD-PELV W/ CM
2 of 5 series · 11 of 46 positions shown, 12 images · IV contrast (omnipaque)
Comparison: Ultrasound 01/07/2021 radiograph 01/06/2021 CT 6222

CLINICAL DATA: Hypoxemia and bacteremia

EXAM:
CT ANGIOGRAPHY CHEST
CT ABDOMEN AND PELVIS WITH CONTRAST
TECHNIQUE: Multidetector CT imaging of the chest was performed using the
standard protocol during bolus administration of intravenous
contrast. Multiplanar CT image reconstructions and MIPs were
obtained to evaluate the vascular anatomy. Multidetector CT imaging
of the abdomen and pelvis was performed using the standard protocol
during bolus administration of intravenous contrast.
CONTRAST:  99mL OMNIPAQUE IOHEXOL 350 MG/ML SOLN

[Series 4: thins · axial · 0.88mm/px · z∈[+761,+1177]mm · 8 of 496 slices shown, 9 images]
[im 40/496  soft-tissue]
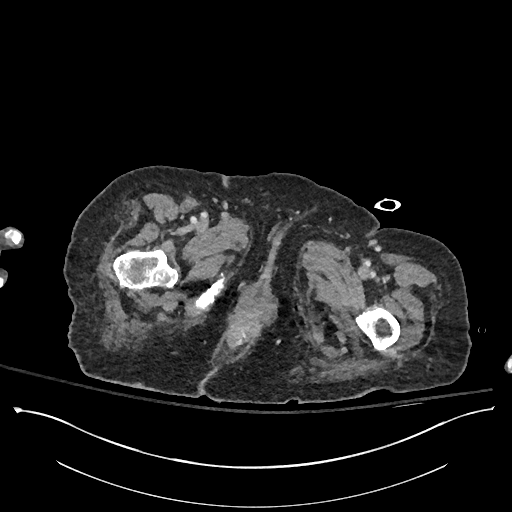
[im 40/496  bone]
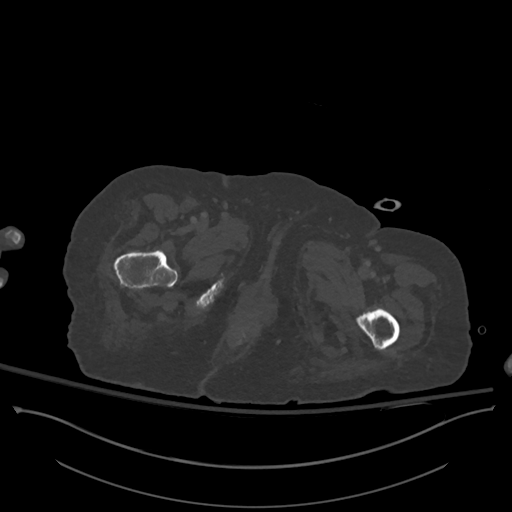
[im 100/496  soft-tissue]
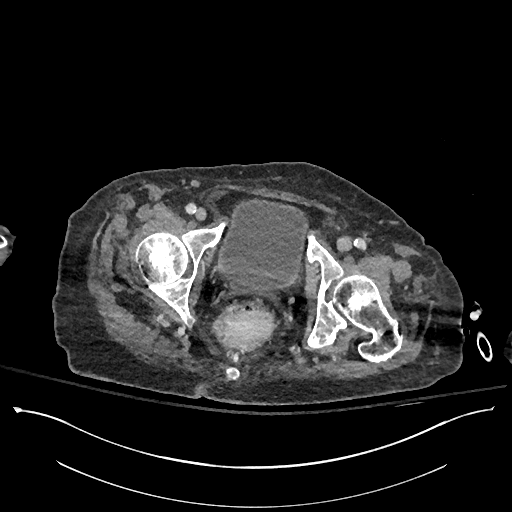
[im 159/496  soft-tissue]
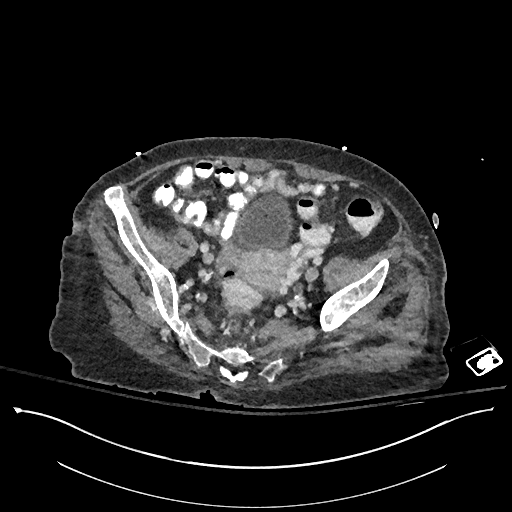
[im 218/496  soft-tissue]
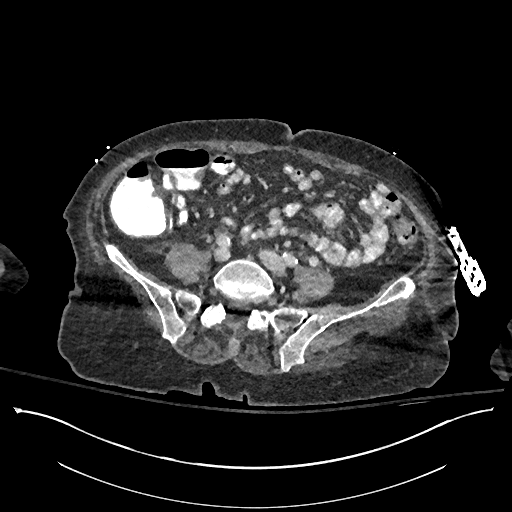
[im 278/496  soft-tissue]
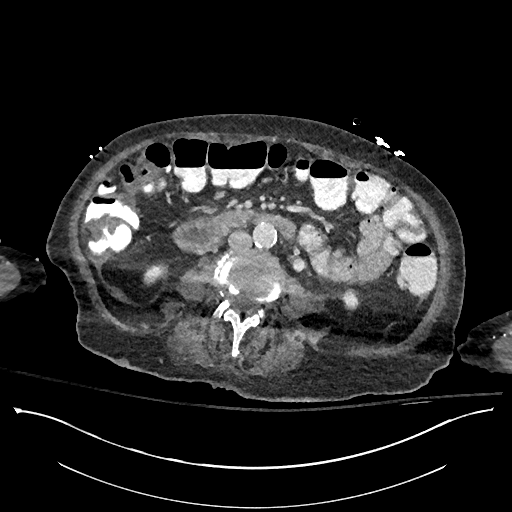
[im 337/496  soft-tissue]
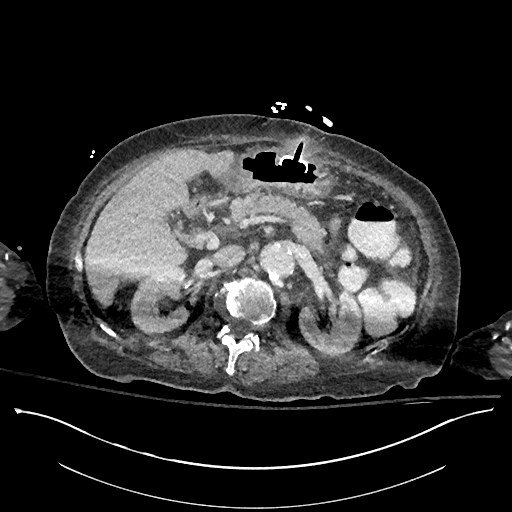
[im 397/496  soft-tissue]
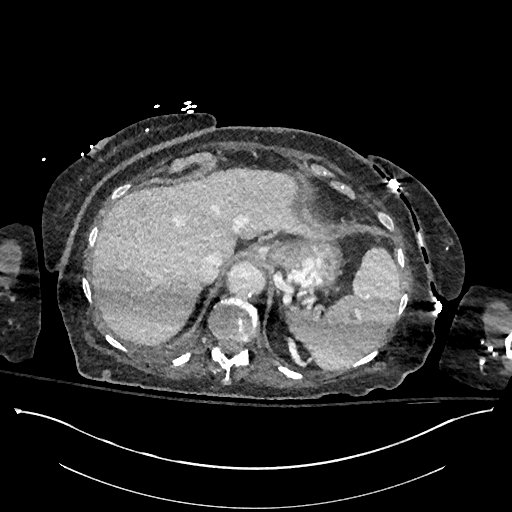
[im 456/496  soft-tissue]
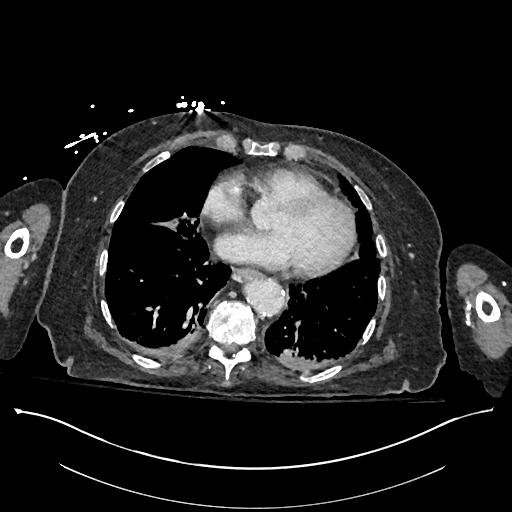

[Series 6: coronals · coronal · 0.80mm/px · 3 of 87 slices shown]
[im 29/87  soft-tissue]
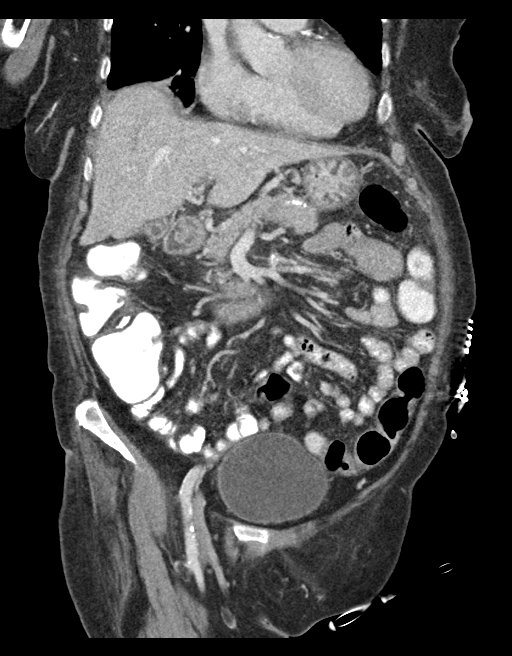
[im 39/87  soft-tissue]
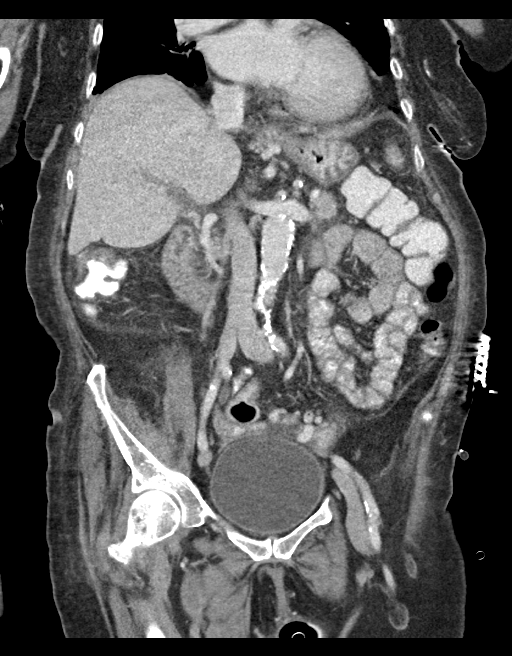
[im 48/87  soft-tissue]
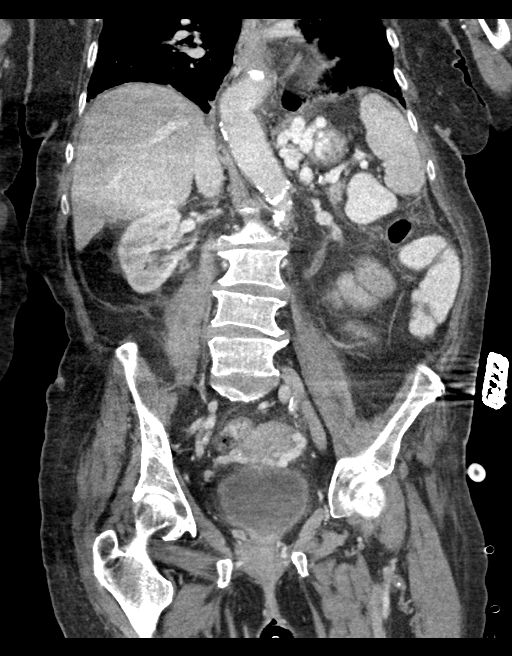

[11 of 46 positions shown; findings below may reference images not displayed]

FINDINGS: CTA CHEST FINDINGS

Cardiovascular: Satisfactory opacification of pulmonary arteries. No
visible pulmonary arterial filling defects though respiratory motion
may limit detection of smaller segmental and subsegmental filling
defects. Central pulmonary arteries are enlarged though similar to
prior caliber. Cardiac size is within normal limits. No pericardial
effusion. Three-vessel coronary artery atherosclerosis. The aortic
root is suboptimally assessed given cardiac pulsation artifact.
Atherosclerotic plaque throughout the thoracic aorta. Dilatation of
the distal thoracic aortic arch to 3.6 cm, returning to a more
normal caliber by the level of the diaphragmatic hiatus of 2.9 cm.
Mild aortic tortuosity, can be senescent. No acute luminal
abnormality of the imaged aorta within the limitations of the
suboptimally opacified exam. No periaortic stranding or hemorrhage.
Normal 3 vessel branching of the aortic arch with calcifications in
the proximal great vessels. No major venous abnormalities.

Mediastinum/Nodes: No mediastinal fluid or gas. Normal thyroid gland
and thoracic inlet. No acute abnormality of the trachea or
esophagus. No worrisome mediastinal, hilar or axillary adenopathy.

Lungs/Pleura: Low volumes and atelectasis on water likely mild
emphysematous changes. Some additional vascular redistribution,
septal thickening is noted as well, can reflect mild interstitial
edema. Trace pleural effusions, right greater than left, with
dependent and passive atelectatic changes in the lung bases. No
concerning pulmonary nodules or masses. Additional bandlike areas of
opacity likely reflect scarring and or atelectasis.

Musculoskeletal: Multilevel degenerative changes are present in the
imaged portions of the spine. Likely remote anterior wedging
deformities T10, T12, unchanged from comparison. Superimposed
Schmorl's node formations and vacuum disc phenomenon. No acute
fracture or or conspicuous osseous abnormality is seen. Mild body
wall edema. No worrisome chest wall mass or lesion. Benign macro
calcifications seen in the breast tissues, suspect many of which are
mass killer.

Review of the MIP images confirms the above findings.

CT ABDOMEN and PELVIS FINDINGS

Hepatobiliary: No worrisome focal liver lesions. Smooth liver
surface contour. Normal hepatic attenuation. Gallbladder
decompressed. No pericholecystic fluid or inflammation. No visible
calcified gallstone or biliary ductal dilatation.

Pancreas: No pancreatic ductal dilatation or surrounding
inflammatory changes.

Spleen: Normal in size. No concerning splenic lesions.

Adrenals/Urinary Tract: Normal adrenals. Kidneys enhance and excrete
symmetrically. Bilateral extrarenal pelves. Question some mild
bladder wall hyperemia and urothelial thickening which could reflect
ascending infection in the appropriate clinical context.
Additionally, there is a punctate radiodensity layering in the left
posterolateral bladder, separate from the ureterovesicular junction,
suggestive of a layering bladder calculus.

Stomach/Bowel: Extensive vascular collateralization in varices
formation about the distal esophagus and proximal stomach.
Percutaneous gastrostomy tube in place with adequate pexy of the
anterior wall of the gastric body to the anterior abdominal wall. No
acute complication. Normally seated balloon. Duodenum is
unremarkable accounting for distension. No small bowel thickening or
dilatation is seen. High attenuation enteric contrast media
traverses through the colon to the level of the rectal vault, lack
of formed stool, can be seen with rapid transit state. Scattered
colonic diverticula without focal inflammation to suggest
diverticulitis.

Vascular/Lymphatic: Juxtarenal abdominal aortic aneurysm measuring
up to 3.2 cm, unchanged from prior. Atherosclerotic calcifications
within the abdominal aorta and branch vessels. No other aneurysm or
ectasia. No enlarged abdominopelvic lymph nodes.

Reproductive: Anteverted uterus. No concerning adnexal lesions.
There is slight asymmetric prominence of the left gonadal vein and
parametrial vasculature, nonspecific though can be seen in the
setting of pelvic congestion.

Other: Stranding and inflammatory changes appears centered upon the
right psoas musculature with some additional hazy stranding
distributed in the retroperitoneum. Possibly reactive change. No
free air. No bowel containing hernia.

Musculoskeletal: Interval contraction of the previously expanded
right psoas musculature albeit with a persistent hypoattenuating
focus centrally within the muscle belly (6/45, 3/53), can reflect a
residual intramuscular hematoma or abscess in the setting of sepsis.
No other intramuscular collection is seen. Multilevel degenerative
changes are noted in the lumbar levels. Levocurvature apex L2-3.
Associated pelvic tilt. Degenerative changes in the hips and pelvis
as well. No acute or conspicuous osseous lesions.

Review of the MIP images confirms the above findings.
IMPRESSION: 1. No evidence of acute pulmonary embolism though respiratory motion
may limit detection of smaller segmental and subsegmental filling
defects.
2. Enlarged central pulmonary arteries, suggestive of pulmonary
arterial hypertension.
3. Trace pleural effusions, right greater than left, with dependent
and passive atelectatic changes in the lung bases.
4. Question some mild bladder wall hyperemia and urothelial
thickening which could reflect ascending infection in the
appropriate clinical context. Correlate with urinalysis.
5. Interval contraction of the previously expanded right psoas
musculature albeit with a persistent hypoattenuating focus centrally
within the muscle belly, can reflect a residual intramuscular
hematoma or abscess in the setting of sepsis. Stranding and
inflammatory changes appears centered upon the right psoas
musculature with some additional hazy stranding distributed in the
retroperitoneum, which could reflect reactive change.
6. High attenuation enteric contrast media traverses through the
colon to the level of the rectal vault, lack of formed stool, can be
seen with rapid transit state.
7. Dilatation of the aortic arch to 3.6 cm. Recommend annual imaging
followup by CTA or MRA. This recommendation follows 8737
ACCF/AHA/AATS/ACR/ASA/SCA/MARILYN/ANTELO/TSOY/ALEXANDRE DOS SANTOS Guidelines for the
Diagnosis and Management of Patients with Thoracic Aortic Disease.
Circulation.8737; 121: E266-e369. Aortic aneurysm NOS (V6OJ8-GLJ.N)
8. Unchanged infrarenal abdominal aortic aneurysm measuring up to
3.2 cm. Recommend follow-up ultrasound every 3 years. This
recommendation follows ACR consensus guidelines: White Paper of the
ACR Incidental Findings Committee II on Vascular Findings. [HOSPITAL] 9331; [DATE].
9. Aortic Atherosclerosis (V6OJ8-TC9.9).
10. Coronary artery calcifications are present. Please note that the
presence of coronary artery calcium documents the presence of
coronary artery disease, the severity of this disease and any
potential stenosis cannot be assessed on this non-gated CT
examination.
